# Patient Record
Sex: Female | Born: 1945 | Race: White | Hispanic: No | Marital: Married | Smoking: Former smoker
Health system: Southern US, Community
[De-identification: ages and names within clinical notes are randomized; demographics above are authoritative.]

## PROBLEM LIST (undated history)

## (undated) DIAGNOSIS — F419 Anxiety disorder, unspecified: Secondary | ICD-10-CM

## (undated) DIAGNOSIS — I219 Acute myocardial infarction, unspecified: Secondary | ICD-10-CM

## (undated) DIAGNOSIS — J189 Pneumonia, unspecified organism: Secondary | ICD-10-CM

## (undated) DIAGNOSIS — G4733 Obstructive sleep apnea (adult) (pediatric): Secondary | ICD-10-CM

## (undated) DIAGNOSIS — I10 Essential (primary) hypertension: Secondary | ICD-10-CM

## (undated) DIAGNOSIS — Z972 Presence of dental prosthetic device (complete) (partial): Secondary | ICD-10-CM

## (undated) DIAGNOSIS — J449 Chronic obstructive pulmonary disease, unspecified: Secondary | ICD-10-CM

## (undated) DIAGNOSIS — M069 Rheumatoid arthritis, unspecified: Secondary | ICD-10-CM

## (undated) DIAGNOSIS — M797 Fibromyalgia: Secondary | ICD-10-CM

## (undated) DIAGNOSIS — M25551 Pain in right hip: Secondary | ICD-10-CM

## (undated) DIAGNOSIS — S2239XA Fracture of one rib, unspecified side, initial encounter for closed fracture: Secondary | ICD-10-CM

## (undated) DIAGNOSIS — K219 Gastro-esophageal reflux disease without esophagitis: Secondary | ICD-10-CM

## (undated) DIAGNOSIS — K529 Noninfective gastroenteritis and colitis, unspecified: Secondary | ICD-10-CM

## (undated) DIAGNOSIS — J45909 Unspecified asthma, uncomplicated: Secondary | ICD-10-CM

## (undated) DIAGNOSIS — E0590 Thyrotoxicosis, unspecified without thyrotoxic crisis or storm: Secondary | ICD-10-CM

## (undated) DIAGNOSIS — M1990 Unspecified osteoarthritis, unspecified site: Secondary | ICD-10-CM

## (undated) DIAGNOSIS — M79643 Pain in unspecified hand: Secondary | ICD-10-CM

## (undated) HISTORY — DX: Fibromyalgia: M79.7

## (undated) HISTORY — DX: Unspecified osteoarthritis, unspecified site: M19.90

## (undated) HISTORY — DX: Noninfective gastroenteritis and colitis, unspecified: K52.9

## (undated) HISTORY — PX: BREAST BIOPSY: SHX20

## (undated) HISTORY — DX: Fracture of one rib, unspecified side, initial encounter for closed fracture: S22.39XA

## (undated) HISTORY — DX: Unspecified asthma, uncomplicated: J45.909

## (undated) HISTORY — DX: Thyrotoxicosis, unspecified without thyrotoxic crisis or storm: E05.90

## (undated) HISTORY — PX: VAGINAL HYSTERECTOMY: SUR661

## (undated) HISTORY — DX: Chronic obstructive pulmonary disease, unspecified: J44.9

## (undated) HISTORY — PX: NASAL SINUS SURGERY: SHX719

## (undated) HISTORY — PX: CHOLECYSTECTOMY: SHX55

## (undated) HISTORY — DX: Obstructive sleep apnea (adult) (pediatric): G47.33

## (undated) HISTORY — DX: Anxiety disorder, unspecified: F41.9

## (undated) HISTORY — DX: Gastro-esophageal reflux disease without esophagitis: K21.9

## (undated) HISTORY — DX: Essential (primary) hypertension: I10

## (undated) HISTORY — DX: Pneumonia, unspecified organism: J18.9

---
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
## 1998-06-30 ENCOUNTER — Encounter: Payer: Self-pay | Admitting: Emergency Medicine

## 1998-06-30 ENCOUNTER — Emergency Department (HOSPITAL_COMMUNITY): Admission: EM | Admit: 1998-06-30 | Discharge: 1998-06-30 | Payer: Self-pay | Admitting: Emergency Medicine

## 1999-03-17 ENCOUNTER — Ambulatory Visit (HOSPITAL_COMMUNITY): Admission: RE | Admit: 1999-03-17 | Discharge: 1999-03-17 | Payer: Self-pay | Admitting: Gastroenterology

## 1999-03-25 ENCOUNTER — Ambulatory Visit (HOSPITAL_COMMUNITY): Admission: RE | Admit: 1999-03-25 | Discharge: 1999-03-25 | Payer: Self-pay | Admitting: Gastroenterology

## 1999-03-25 ENCOUNTER — Encounter: Payer: Self-pay | Admitting: Gastroenterology

## 1999-04-09 ENCOUNTER — Encounter: Admission: RE | Admit: 1999-04-09 | Discharge: 1999-04-09 | Payer: Self-pay | Admitting: General Surgery

## 1999-04-09 ENCOUNTER — Encounter: Payer: Self-pay | Admitting: General Surgery

## 1999-04-22 ENCOUNTER — Ambulatory Visit (HOSPITAL_COMMUNITY): Admission: RE | Admit: 1999-04-22 | Discharge: 1999-04-23 | Payer: Self-pay | Admitting: General Surgery

## 1999-04-22 ENCOUNTER — Encounter (INDEPENDENT_AMBULATORY_CARE_PROVIDER_SITE_OTHER): Payer: Self-pay

## 1999-04-22 ENCOUNTER — Encounter: Payer: Self-pay | Admitting: General Surgery

## 1999-07-28 ENCOUNTER — Encounter: Payer: Self-pay | Admitting: Emergency Medicine

## 1999-07-28 ENCOUNTER — Emergency Department (HOSPITAL_COMMUNITY): Admission: EM | Admit: 1999-07-28 | Discharge: 1999-07-28 | Payer: Self-pay | Admitting: Emergency Medicine

## 1999-07-29 ENCOUNTER — Ambulatory Visit (HOSPITAL_COMMUNITY): Admission: RE | Admit: 1999-07-29 | Discharge: 1999-07-29 | Payer: Self-pay | Admitting: Gastroenterology

## 1999-07-29 ENCOUNTER — Encounter (INDEPENDENT_AMBULATORY_CARE_PROVIDER_SITE_OTHER): Payer: Self-pay | Admitting: *Deleted

## 2000-01-12 ENCOUNTER — Encounter: Payer: Self-pay | Admitting: Family Medicine

## 2000-01-12 ENCOUNTER — Ambulatory Visit (HOSPITAL_COMMUNITY): Admission: RE | Admit: 2000-01-12 | Discharge: 2000-01-12 | Payer: Self-pay | Admitting: Family Medicine

## 2000-08-17 ENCOUNTER — Encounter: Payer: Self-pay | Admitting: Family Medicine

## 2000-08-17 ENCOUNTER — Ambulatory Visit (HOSPITAL_COMMUNITY): Admission: RE | Admit: 2000-08-17 | Discharge: 2000-08-17 | Payer: Self-pay | Admitting: Family Medicine

## 2001-03-01 ENCOUNTER — Ambulatory Visit (HOSPITAL_COMMUNITY): Admission: RE | Admit: 2001-03-01 | Discharge: 2001-03-01 | Payer: Self-pay | Admitting: Family Medicine

## 2001-03-01 ENCOUNTER — Encounter: Payer: Self-pay | Admitting: Family Medicine

## 2001-08-01 ENCOUNTER — Ambulatory Visit: Admission: RE | Admit: 2001-08-01 | Discharge: 2001-08-01 | Payer: Self-pay | Admitting: Family Medicine

## 2001-09-20 ENCOUNTER — Ambulatory Visit (HOSPITAL_COMMUNITY): Admission: RE | Admit: 2001-09-20 | Discharge: 2001-09-20 | Payer: Self-pay | Admitting: Internal Medicine

## 2002-07-31 ENCOUNTER — Encounter: Admission: RE | Admit: 2002-07-31 | Discharge: 2002-07-31 | Payer: Self-pay | Admitting: Endocrinology

## 2002-07-31 ENCOUNTER — Encounter: Payer: Self-pay | Admitting: Endocrinology

## 2002-08-30 ENCOUNTER — Ambulatory Visit (HOSPITAL_COMMUNITY): Admission: RE | Admit: 2002-08-30 | Discharge: 2002-08-30 | Payer: Self-pay | Admitting: *Deleted

## 2002-09-04 ENCOUNTER — Encounter: Payer: Self-pay | Admitting: Family Medicine

## 2002-09-04 ENCOUNTER — Ambulatory Visit: Admission: RE | Admit: 2002-09-04 | Discharge: 2002-09-04 | Payer: Self-pay | Admitting: Family Medicine

## 2004-02-05 ENCOUNTER — Ambulatory Visit (HOSPITAL_COMMUNITY): Admission: RE | Admit: 2004-02-05 | Discharge: 2004-02-05 | Payer: Self-pay | Admitting: Family Medicine

## 2005-03-30 ENCOUNTER — Ambulatory Visit: Payer: Self-pay | Admitting: Internal Medicine

## 2005-04-15 ENCOUNTER — Ambulatory Visit: Admission: RE | Admit: 2005-04-15 | Discharge: 2005-04-15 | Payer: Self-pay | Admitting: Family Medicine

## 2005-05-18 ENCOUNTER — Ambulatory Visit: Payer: Self-pay | Admitting: Internal Medicine

## 2006-04-26 ENCOUNTER — Encounter: Admission: RE | Admit: 2006-04-26 | Discharge: 2006-04-26 | Payer: Self-pay | Admitting: Endocrinology

## 2006-06-22 ENCOUNTER — Ambulatory Visit (HOSPITAL_COMMUNITY): Admission: RE | Admit: 2006-06-22 | Discharge: 2006-06-22 | Payer: Self-pay | Admitting: *Deleted

## 2008-04-09 ENCOUNTER — Other Ambulatory Visit: Admission: RE | Admit: 2008-04-09 | Discharge: 2008-04-09 | Payer: Self-pay | Admitting: Family Medicine

## 2008-08-12 ENCOUNTER — Emergency Department (HOSPITAL_COMMUNITY): Admission: EM | Admit: 2008-08-12 | Discharge: 2008-08-12 | Payer: Self-pay | Admitting: Emergency Medicine

## 2008-08-22 ENCOUNTER — Encounter: Admission: RE | Admit: 2008-08-22 | Discharge: 2008-08-22 | Payer: Self-pay | Admitting: Family Medicine

## 2008-08-31 ENCOUNTER — Encounter: Admission: RE | Admit: 2008-08-31 | Discharge: 2008-08-31 | Payer: Self-pay | Admitting: Neurosurgery

## 2009-10-14 ENCOUNTER — Ambulatory Visit: Payer: Self-pay | Admitting: Cardiology

## 2009-10-16 ENCOUNTER — Telehealth (INDEPENDENT_AMBULATORY_CARE_PROVIDER_SITE_OTHER): Payer: Self-pay | Admitting: *Deleted

## 2009-10-17 ENCOUNTER — Encounter: Payer: Self-pay | Admitting: Cardiology

## 2009-10-17 ENCOUNTER — Encounter (HOSPITAL_COMMUNITY): Admission: RE | Admit: 2009-10-17 | Discharge: 2009-11-20 | Payer: Self-pay | Admitting: Cardiology

## 2009-10-17 ENCOUNTER — Ambulatory Visit: Payer: Self-pay

## 2009-10-17 ENCOUNTER — Ambulatory Visit: Payer: Self-pay | Admitting: Cardiology

## 2010-03-27 NOTE — Progress Notes (Signed)
Summary: Nuc Pre-Procedure  Phone Note Outgoing Call Call back at [facility] Phone [phone]   Call placed by: Antionette Char RN,  October 16, 2009 2:29 PM Call placed to: Patient Reason for Call: Confirm/change Appt Summary of Call: Reviewed information on My[employer] Information Sheet (see scanned document for further details).  Spoke with patient.     Nuclear Med Background Indications for Stress Test: Evaluation for Ischemia   History: Asthma, COPD, Heart Catheterization, Myocardial Perfusion Study  History Comments: MPS Nml  Symptoms: Chest Pain  Symptoms Comments: CP with Radiation into R Jaw and Back   Nuclear Pre-Procedure Cardiac Risk Factors: Family History - CAD, History of Smoking, Hypertension

## 2010-03-27 NOTE — Assessment & Plan Note (Signed)
Summary: Cardiology Nuclear Testing  Nuclear Med Background Indications for Stress Test: Evaluation for Ischemia   History: Asthma, COPD, Heart Catheterization, Myocardial Perfusion Study  History Comments: yrs ago MPS and Cath:normal  Symptoms: Chest Pressure, Chest Pressure with Exertion, Diaphoresis, DOE, Fatigue, Rapid HR  Symptoms Comments: CP>jaw/back   Nuclear Pre-Procedure Cardiac Risk Factors: Family History - CAD, History of Smoking, Hypertension Caffeine/Decaff Intake: None NPO After: 7:30 AM Lungs: Clear.  O2 Sat 96%. IV 0.9% NS with Angio Cath: 24g     IV Site: Rt Hand IV Started by: Bonnita Levan, RN Chest Size (in) 42     Cup Size B     Height (in): 68 Weight (lb): 197 BMI: 30.06  Nuclear Med Study 1 or 2 day study:  1 day     Stress Test Type:  Treadmill/Lexiscan Reading MD:  Willa Rough, MD     Referring MD:  Peter Swaziland, MD Resting Radionuclide:  Technetium 48m Tetrofosmin     Resting Radionuclide Dose:  11 mCi  Stress Radionuclide:  Technetium 33m Tetrofosmin     Stress Radionuclide Dose:  33 mCi   Stress Protocol Exercise Time (min):  2:00 min     Max HR:  117 bpm     Predicted Max HR:  156 bpm       Percent Max HR:  75 % Lexiscan: 0.4 mg   Stress Test Technologist:  Rea [employer], CMA-N     Nuclear Technologist:  Doyne Keel, CNMT  Rest Procedure  Myocardial perfusion imaging was performed at rest 45 minutes following the intravenous administration of Technetium 75m Tetrofosmin.  Stress Procedure  The patient received IV Lexiscan 0.4 mg over 15-seconds with concurrent low level exercise and then technetium 69m Tetrofosmin was injected at 30-seconds while the patient continued walking one more minute.  There were no significant changes with infusion.  She did c/o chest pressure.  Quantitative spect images were obtained after a 45 minute delay.  QPS Raw Data Images:  Patient motion noted; appropriate software correction applied. Stress Images:  Normal  homogeneous uptake in all areas of the myocardium. Rest Images:  Normal homogeneous uptake in all areas of the myocardium. Subtraction (SDS):  No evidence of ischemia. Transient Ischemic Dilatation:  1.14  (Normal <1.22)  Lung/Heart Ratio:  0.41  (Normal <0.45)  Quantitative Gated Spect Images QGS EDV:  69 ml QGS ESV:  24 ml QGS EF:  65 % QGS cine images:  Normal  Findings Normal nuclear study      Overall Impression  Exercise Capacity: Lexiscan with low level exercise BP Response: Normal blood pressure response. Clinical Symptoms: chest pressure ECG Impression: No significant ST segment change suggestive of ischemia. Overall Impression: Normal stress nuclear study.

## 2010-06-02 LAB — URINALYSIS, ROUTINE W REFLEX MICROSCOPIC
Bilirubin Urine: NEGATIVE
Glucose, UA: NEGATIVE mg/dL
Ketones, ur: NEGATIVE mg/dL
Nitrite: POSITIVE — AB
Protein, ur: 100 mg/dL — AB
Specific Gravity, Urine: 1.014 (ref 1.005–1.030)
Urobilinogen, UA: 0.2 mg/dL (ref 0.0–1.0)
pH: 5.5 (ref 5.0–8.0)

## 2010-06-02 LAB — URINE MICROSCOPIC-ADD ON

## 2010-07-11 NOTE — Op Note (Signed)
   NAME:  Kelsey Diaz, Kelsey Diaz                        ACCOUNT NO.:  [id number]   MEDICAL RECORD NO.:  [id number]                   PATIENT TYPE:  AMB   LOCATION:  ENDO                                 FACILITY:  [facility]   PHYSICIAN:  Georgiana Spinner, M.D.                 DATE OF BIRTH:  1945/12/08   DATE OF PROCEDURE:  DATE OF DISCHARGE:                                 OPERATIVE REPORT   PROCEDURE:  Upper endoscopy with Savary dilation.   INDICATIONS FOR PROCEDURE:  Dysphagia.   ANESTHESIA:  Demerol 80, Versed 8 mg.   DESCRIPTION OF PROCEDURE:  With the patient mildly sedated in the left  lateral decubitus position, the Olympus videoscopic endoscope was inserted  in the mouth and passed under direct vision through the esophagus which  appeared normal until we reached the distal esophagus and there was not a  stricture seen. We entered into the stomach. The fundus, body, antrum,  duodenal bulb and second portion of the duodenum were visualized. From this  point, the endoscope was slowly withdrawn taking circumferential views of  the duodenal mucosa until the endoscope was then pulled back in the stomach,  placed in retroflexion to view the stomach from below and a hiatal hernia  was seen and photographed. The endoscope was then straightened and withdrawn  taking circumferential views of the remaining gastric and esophageal mucosa.  It was then passed distally. Once again a guidewire was passed, the  endoscope was withdrawn and over the guidewire 16, 17 and 18 Savary dilators  were passed rather easily with no ____________, there was some blood but not  with the last or the first. The endoscope was reinserted. A small amount of  blood seen in the distal esophagus, otherwise, an unremarkable followup. The  endoscope was withdrawn.  The patient's vital signs and pulse oximeter  remained stable. The patient tolerated the procedure well without apparent  complications.   FINDINGS:  Successful  dilation.   PLAN:  Await clinical response. The patient will call me for results and  followup with me as an outpatient.                                               Georgiana Spinner, M.D.    GMO/MEDQ  D:  08/30/2002  T:  08/30/2002  Job:  [id number]

## 2010-07-11 NOTE — Op Note (Signed)
NAME:  Kelsey Diaz, Kelsey Diaz              ACCOUNT NO.:  [id number]   MEDICAL RECORD NO.:  [id number]          PATIENT TYPE:  AMB   LOCATION:  ENDO                         FACILITY:  [facility]   PHYSICIAN:  Georgiana Spinner, M.D.    DATE OF BIRTH:  1945-04-06   DATE OF PROCEDURE:  DATE OF DISCHARGE:                               OPERATIVE REPORT   PROCEDURE:  Colonoscopy.   INDICATIONS:  Colon cancer screening, blood in stool, abdominal pain.   ANESTHESIA:  Fentanyl 100 mcg, Versed 10 mg.   PROCEDURE:  With the patient mildly sedated in the left lateral  decubitus position, the Pentax videoscopic colonoscope was inserted in  the rectum and passed under direct vision to the cecum, identified by  the ileocecal valve and base of cecum, both of which were photographed.  From this point colonoscope was slowly withdrawn taking circumferential  views of the colonic mucosa, stopping only in the rectum, which appeared  normal on direct and showed hemorrhoids on retroflexed view.  The  endoscope was straightened and withdrawn.  The patient's vital signs and  pulse oximetry remained stable.  The patient tolerated the procedure  well without apparent complications.   FINDINGS:  1. Diverticulosis of the sigmoid colon.  2. Internal hemorrhoids.  3. Otherwise an unremarkable colonoscopic examination to the cecum.   PLAN:  Repeat examination possibly in 5 years.           ______________________________  Georgiana Spinner, M.D.     GMO/MEDQ  D:  06/22/2006  T:  06/22/2006  Job:  [id number]

## 2010-07-11 NOTE — Procedures (Signed)
[place]. [facility]  Patient:    Kelsey Diaz                      MRN: [id number] Proc. Date: 03/17/99 Adm. Date:  [id number] Attending:  Louie Bun CC:         Dario Guardian, M.D.                           Procedure Report  PROCEDURE:  Esophagogastroduodenoscopy.  SURGEON:  John C. Madilyn Fireman, M.D.  INDICATIONS:  Epigastric burning with ulcer-like features, as well as, gastroesophageal reflux symptoms.  The latter have been responsive to protonics, but the former have not.  Procedure is to assess for refractory acid/peptic disease of the upper GI tract and to assess Helicobacter status.  PROCEDURE:  The patient was placed in the left lateral decubitus position and placed on the pulse monitor with continuous low-flow oxygen delivered by nasal cannula.  She was sedated with 30 mg IV Demerol and 5 mg IV Versed.  The Olympus video endoscope was advanced under direct vision into the oropharynx and esophagus. The esophagus was straight and of normal caliber at the squamocolumnar line at 38 cm.  There was a 1-1.5 mm sliding hiatal hernia and a thin, barely visible lower esophageal ring that was able to be seen only when the LES was fully relaxed. There was no visible esophagitis or other abnormality of the GE junction.  The stomach was entered and a small amount of liquid secretions were suctioned from the fundus.  Retroflexed view of the cardia was unremarkable.  The fundus, body, antrum and pylorus were all well inspected and appeared to be within normal limits. The pylorus was nondeformed and easily allowed passage of the endoscope tipping the  duodenum.  Both the bulb and second portion were well inspected and appeared to be within normal limits.  The endoscope was withdrawn back into the stomach and then CLO test obtained.  The scope is then withdrawn and the patient returned to the  recovery room in stable condition.  She tolerated  the procedure well and there ere no immediate complications.  IMPRESSION:  A 1.5 cm hiatal hernia with patent lower esophageal ring, otherwise normal endoscopy.  PLAN:  Await CLO test and treat for eradication of Helicobacter if positive. If negative, will pursue abdominal ultrasound. DD:  03/17/99 TD:  03/17/99 Job: [id number]

## 2010-07-11 NOTE — Procedures (Signed)
[place]. [facility]  Patient:    Kelsey Diaz, Kelsey Diaz                     MRN: [id number] Proc. Date: 07/29/99 Adm. Date:  [id number] Disc. Date: [id number] Attending:  Doug Sou CC:         Dario Guardian, M.D.                           Procedure Report  PROCEDURE PERFORMED:  Colonoscopy with biopsy.  ENDOSCOPIST:  Everardo All. Madilyn Fireman, M.D.  INDICATIONS FOR PROCEDURE:  Chronic diarrhea with negative work-up to-date and failure to respond to empiric trials.  Procedure is primarily to rule out an inflammatory condition of the colon or terminal ileum.  DESCRIPTION OF PROCEDURE:  The patient was placed in the left lateral decubitus position and placed on the pulse monitor and continuous low flow oxygen delivered by nasal cannula.  She was sedated with 100 mg IV Demerol and 8 mg IV Versed.  The Olympus video colonoscope was inserted into the rectum and advanced to the cecum, confirmed by transillumination of McBurneys point and visualization of the ileocecal valve and appendiceal orifice.  The prep was excellent.  The terminal ileum was intubated and explored for several centimeters and appeared to be within normal limits.  Biopsies were then taken of the cecum and ascending colon which appeared normal.  The transverse, descending, sigmoid and rectum all likewise appeared normal with no masses, polyps, diverticula or any visible mucosal abnormalities.  Biopsies were taken of the rectum and sigmoid and sent in a separate specimen container to rule out collaginous colitis.  Small internal hemorrhoids were seen on withdrawal. The scope was then withdrawn and the patient returned to the recovery room in stable condition.  The patient tolerated the procedure well.  There were no immediate complications.  IMPRESSION:  Internal hemorrhoids, otherwise normal colonoscopy including terminal ileum.  PLAN:  Await biopsy results and pursue further work-up of diarrhea  as indicated. DD:  07/29/99 TD:  08/01/99 Job: [id number]

## 2011-02-24 HISTORY — PX: KNEE SURGERY: SHX244

## 2011-03-02 ENCOUNTER — Other Ambulatory Visit: Payer: Self-pay | Admitting: Family Medicine

## 2011-03-02 DIAGNOSIS — R51 Headache: Secondary | ICD-10-CM

## 2011-03-04 ENCOUNTER — Ambulatory Visit
Admission: RE | Admit: 2011-03-04 | Discharge: 2011-03-04 | Disposition: A | Discharge status: Home / Self Care | Payer: Medicare Other | Source: Ambulatory Visit | Attending: Family Medicine | Admitting: Family Medicine

## 2011-03-04 ENCOUNTER — Ambulatory Visit
Admission: RE | Admit: 2011-03-04 | Discharge: 2011-03-04 | Disposition: A | Discharge status: Home / Self Care | Payer: Self-pay | Source: Ambulatory Visit | Attending: Family Medicine | Admitting: Family Medicine

## 2011-03-04 DIAGNOSIS — R51 Headache: Secondary | ICD-10-CM

## 2011-03-06 ENCOUNTER — Other Ambulatory Visit: Payer: Self-pay | Admitting: Obstetrics and Gynecology

## 2011-09-07 ENCOUNTER — Ambulatory Visit (INDEPENDENT_AMBULATORY_CARE_PROVIDER_SITE_OTHER)
Admission: RE | Admit: 2011-09-07 | Discharge: 2011-09-07 | Disposition: A | Discharge status: Home / Self Care | Payer: Medicare Other | Source: Ambulatory Visit | Attending: Pulmonary Disease | Admitting: Pulmonary Disease

## 2011-09-07 ENCOUNTER — Encounter: Payer: Self-pay | Admitting: *Deleted

## 2011-09-07 ENCOUNTER — Ambulatory Visit (INDEPENDENT_AMBULATORY_CARE_PROVIDER_SITE_OTHER): Payer: Medicare Other | Admitting: Pulmonary Disease

## 2011-09-07 VITALS — BP 166/100 | HR 113 | Temp 99.2°F | Ht 67.5 in | Wt 207.8 lb

## 2011-09-07 DIAGNOSIS — J4489 Other specified chronic obstructive pulmonary disease: Secondary | ICD-10-CM

## 2011-09-07 DIAGNOSIS — J449 Chronic obstructive pulmonary disease, unspecified: Secondary | ICD-10-CM

## 2011-09-07 MED ORDER — AEROCHAMBER MV MISC: Status: AC

## 2011-09-07 MED ORDER — MOMETASONE FURO-FORMOTEROL FUM 100-5 MCG/ACT IN AERO: 2.0000 | INHALATION_SPRAY | Freq: Two times a day (BID) | RESPIRATORY_TRACT | Status: DC

## 2011-09-07 NOTE — Progress Notes (Signed)
Chief Complaint  Patient presents with  . Advice Only    Ref by Dr. C.White- asthma and COPD-pt reports having SOB, wheezing and chest tightness for about 6months, heat and movement worsen sypmtoms and  resting relieves syptoms, reports dry nonprod cough    History of Present Illness: Kelsey Diaz is a 66 y.o. female former smoker for evaluation of COPD.  She has noticed trouble with her breathing for years.  This has gotten progressively worse after she had a water leak in her home.  She thinks she may have mold in her house now.  She has been on prednisone 4 times in the past one year due to her breathing problems.  She is currently on a prednisone taper.  She also has allergy to lilies.  Unfortunately, she received this type of flower for Mother's day, and she has noticed her breathing getting worse since then.  She is not aware of any other allergen, and has never been tested for allergies before.  She was on advair before.  This helped open her airways, but then also triggered increased cough.  She has noticed more cough and respiratory distress around strong smells.  Her cough is usually dry.  She was having episodes of wheezing prior to starting prednisone again.  Her breathing and cough are better with prednisone therapy.  She has tried pulmicort, but did not tolerate this due to smell of medicine.  She was tried on spiriva, but this made her mouth dry.  She does get frequent episodes of sinus congestion and post-nasal drip.  She uses flonase and claritin, but only as needed.   She had sinus surgery years ago, and this did help her sinus congestion some.  She does have a hiatal hernia, but denies symptoms of reflux.  She quit smoking in 2001.  She is from [place], and denies any recent travel.  She denies any animal exposures.  There is no history of occupational exposures.  She was on lisinopril, but this was stopped August 12, 2011 by her PCP due to concern she could have ACE  inhibitor cough.   Past Medical History  Diagnosis Date  . Chest pain   . Fibromyalgia   . Hypertension   . COPD (chronic obstructive pulmonary disease)   . Osteoarthritis   . GERD (gastroesophageal reflux disease)     With esophageal strictures  . Colitis   . Hyperthyroidism   . Anxiety   . Allergic rhinitis   . Asthma     Past Surgical History  Procedure Date  . Cholecystectomy     2005  . Breast biopsy     x2  . Vaginal hysterectomy   . Nasal sinus surgery     Current Outpatient Prescriptions on File Prior to Visit  Medication Sig Dispense Refill  . albuterol (PROVENTIL) (2.5 MG/3ML) 0.083% nebulizer solution Take 2.5 mg by nebulization every 6 (six) hours as needed.      . Calcium Carbonate-Vitamin D (CALCIUM + D) 600-200 MG-UNIT TABS Take by mouth daily.      . DULoxetine (CYMBALTA) 60 MG capsule Take 60 mg by mouth daily.      . fluticasone (FLONASE) 50 MCG/ACT nasal spray Place 2 sprays into the nose as needed.      . loratadine (CLARITIN) 10 MG tablet Take 10 mg by mouth as needed.      Marland Kitchen losartan (COZAAR) 25 MG tablet Take 25 mg by mouth daily.      Marland Kitchen  meloxicam (MOBIC) 15 MG tablet Take 15 mg by mouth daily.      . Multiple Vitamin (MULTI-VITAMIN PO) Take by mouth daily.      Marland Kitchen omeprazole (PRILOSEC) 20 MG capsule Take 20 mg by mouth 2 (two) times daily.      . traMADol (ULTRAM) 50 MG tablet Take 50 mg by mouth every 6 (six) hours as needed.      Marland Kitchen GLUCOSAMINE PO Take by mouth daily.      . mometasone-formoterol (DULERA) 100-5 MCG/ACT AERO Inhale 2 puffs into the lungs 2 (two) times daily.  1 Inhaler  5  . Omega-3 Fatty Acids (FISH OIL) 1000 MG CAPS Take 1,000 mg by mouth daily.      . sertraline (ZOLOFT) 100 MG tablet Take 100 mg by mouth 2 (two) times daily.        Allergies  Allergen Reactions  . Advair Diskus (Fluticasone-Salmeterol)     Sensitivity to smells  . Ambien (Zolpidem Tartrate)     irritable  . Amlodipine Besylate     Feels bad  . Bextra  (Valdecoxib)     Stomach issues  . Captopril     Ache  . Lisinopril     cough  . Morphine   . Oxycodone Hcl   . Pulmicort (Budesonide)     Worse breathing, sensitivity to smells  . Savella (Milnacipran Hcl)     Mental fog  . Spiriva (Tiotropium Bromide Monohydrate)     Dry mouth    Family History  Problem Relation Age of Onset  . Hypertension Mother   . Diabetes Mother   . Heart attack Father   . Cancer Father     kidney  . Heart attack Brother   . Emphysema Father   . Allergies Mother   . Heart disease Father     History  Substance Use Topics  . Smoking status: Former Smoker -- 2.0 packs/day for 40 years    Types: Cigarettes    Quit date: 02/24/1999  . Smokeless tobacco: Never Used  . Alcohol Use: Yes     occassioal    Review of Systems  Constitutional: Positive for diaphoresis and fatigue. Negative for fever, chills, activity change, appetite change and unexpected weight change.  HENT: Positive for rhinorrhea and postnasal drip. Negative for nosebleeds, congestion, sore throat, sneezing, mouth sores, trouble swallowing, dental problem, voice change, sinus pressure, tinnitus and ear discharge.   Eyes: Negative for photophobia, discharge, itching and visual disturbance.  Respiratory: Positive for cough, choking, chest tightness, shortness of breath and wheezing.   Cardiovascular: Positive for palpitations. Negative for chest pain and leg swelling.  Gastrointestinal: Positive for nausea, diarrhea and constipation. Negative for vomiting, abdominal pain, blood in stool and abdominal distention.  Genitourinary: Negative for dysuria, urgency, hematuria and difficulty urinating.  Musculoskeletal: Negative for myalgias, back pain, joint swelling, arthralgias and gait problem.  Skin: Positive for rash.  Neurological: Positive for dizziness. Negative for tremors, seizures, syncope, speech difficulty, weakness, light-headedness, numbness and headaches.  Hematological: Negative  for adenopathy. Bruises/bleeds easily.  Psychiatric/Behavioral: Positive for confusion, disturbed wake/sleep cycle and agitation. The patient is nervous/anxious.    Physical Exam: Filed Vitals:   09/07/11 1044  BP: 166/100  Pulse: 113  Temp: 99.2 F (37.3 C)  TempSrc: Oral  Height: 5' 7.5" (1.715 m)  Weight: 207 lb 12.8 oz (94.257 kg)  SpO2: 92%  ,  Current Encounter SPO2  09/07/11 1044 92%   Wt Readings from Last 3 Encounters:  09/07/11 207 lb 12.8 oz (94.257 kg)  10/17/09 197 lb (89.359 kg)   Body mass index is 32.07 kg/(m^2).  General - No distress ENT - Ears normal. Throat and pharynx normal. Neck supple. No adenopathy or masses in the neck or supraclavicular regions. Nasal septum midline, no deformities, nares patent, normal mucosa without swelling, no polyps, no bleeding. Paranasal sinuses are normal. No tenderness to the maxillary, frontal, ethmoids or mastoids. Cardiac - S1 and S2 normal, no murmurs, clicks, gallops or rubs. Regular rate and rhythm.  No edema or JVD. Chest - Chest is clear, no wheezing or rales. Normal symmetric air entry throughout both lung fields. No chest wall deformities or tenderness. Back - Cervical, thoracic and lumbar spine exam is normal without tenderness, masses or kyphoscoliosis. Full range of motion without pain is noted. Abd - soft without tenderness, guarding, mass, rebound or organomegaly. Bowel sounds are normal. No CVA tenderness noted. Ext - good pulses, motor strength and sensation. Neuro - Cranial nerves are normal. PERLA. EOM's intact. Skin - no discernible active dermatitis, erythema, urticaria or inflammatory process. Psych - normal mood, and behavior.    Assessment/Plan:  Coralyn Helling, MD [place] [facility] Pager:  [phone] 09/08/2011, 8:24 AM  Patient Instructions  Chest xray today Will schedule breathing test (PFT) Nasal irrigation (saline sinus spray) daily until next visit Flonase two sprays  each nostril daily until next visit Dulera two puffs twice per day, and rinse mouth after each use>>use with spacer device Proair two puffs as needed Follow up in 3 to 4 weeks

## 2011-09-07 NOTE — Assessment & Plan Note (Addendum)
She has prior history of smoking.  She reports history of asthma.  She has sinus congestion, and post-nasal drip with sensitivity to smells.    I have advised her to finish her current course of prednisone.  I do not think she needs antibiotics at present.  To further assess with arrange for chest xray and pulmonary function testing.  To further treat her sinuses I have advised that she use nasal irrigation, and flonase on a daily basis until her next visit.  She can continue claritin as needed.  I explained how sinus disease and post-nasal drip can contribute to her lower respiratory symptoms.  If her symptoms do not improve, then she may need further allergy assessment.  She has difficulty tolerating inhaler therapy.  Will try her on dulera with a spacer device.  She can continue albuterol as needed.  If is also possible her cough could be related to prior ACE inhibitor use.  While she has not noticed initial improvement after stopping lisinopril, it can sometimes take several weeks before noticing improvement.

## 2011-09-07 NOTE — Progress Notes (Deleted)
  Subjective:    Patient ID: Kelsey Diaz, female    DOB: 01/17/1946, 66 y.o.   MRN: [id number]  HPI    Review of Systems  Constitutional: Positive for diaphoresis and fatigue. Negative for fever, chills, activity change, appetite change and unexpected weight change.  HENT: Positive for rhinorrhea and postnasal drip. Negative for nosebleeds, congestion, sore throat, sneezing, mouth sores, trouble swallowing, dental problem, voice change, sinus pressure, tinnitus and ear discharge.   Eyes: Negative for photophobia, discharge, itching and visual disturbance.  Respiratory: Positive for cough, choking, chest tightness, shortness of breath and wheezing.   Cardiovascular: Positive for palpitations. Negative for chest pain and leg swelling.  Gastrointestinal: Positive for nausea, diarrhea and constipation. Negative for vomiting, abdominal pain, blood in stool and abdominal distention.  Genitourinary: Negative for dysuria, urgency, hematuria and difficulty urinating.  Musculoskeletal: Negative for myalgias, back pain, joint swelling, arthralgias and gait problem.  Skin: Positive for rash.  Neurological: Positive for dizziness. Negative for tremors, seizures, syncope, speech difficulty, weakness, light-headedness, numbness and headaches.  Hematological: Negative for adenopathy. Bruises/bleeds easily.  Psychiatric/Behavioral: Positive for confusion, disturbed wake/sleep cycle and agitation. The patient is nervous/anxious.        Objective:   Physical Exam        Assessment & Plan:

## 2011-09-07 NOTE — Patient Instructions (Signed)
Chest xray today Will schedule breathing test (PFT) Nasal irrigation (saline sinus spray) daily until next visit Flonase two sprays each nostril daily until next visit Dulera two puffs twice per day, and rinse mouth after each use>>use with spacer device Proair two puffs as needed Follow up in 3 to 4 weeks

## 2011-09-08 ENCOUNTER — Encounter: Payer: Self-pay | Admitting: Pulmonary Disease

## 2011-09-08 ENCOUNTER — Telehealth: Payer: Self-pay | Admitting: Pulmonary Disease

## 2011-09-08 NOTE — Telephone Encounter (Signed)
Dg Chest 2 View  09/07/2011  *RADIOLOGY REPORT*  Clinical Data: Cough, shortness of breath.  CHEST - 2 VIEW  Comparison: 02/05/2004  Findings: There is hyperinflation of the lungs compatible with COPD.  Heart and mediastinal contours are within normal limits.  No focal opacities or effusions.  No acute bony abnormality.  IMPRESSION: COPD.  No active disease.  Original Report Authenticated By: Cyndie Chime, M.D.    Will have my nurse inform patient that CXR shows expected changes from COPD.  No other findings.  No change to treatment plan.

## 2011-09-08 NOTE — Telephone Encounter (Signed)
I spoke with patient about results and she verbalized understanding and had no questions 

## 2011-09-09 ENCOUNTER — Institutional Professional Consult (permissible substitution): Payer: Medicare Other | Admitting: Internal Medicine

## 2011-09-25 ENCOUNTER — Telehealth: Payer: Self-pay | Admitting: Pulmonary Disease

## 2011-09-25 NOTE — Telephone Encounter (Signed)
Spoke with Marchelle Folks and notified her of pt's dx and ins id number for the aero chamber that they provided for pt. Nothing further needed.

## 2011-10-09 ENCOUNTER — Ambulatory Visit (INDEPENDENT_AMBULATORY_CARE_PROVIDER_SITE_OTHER): Payer: Medicare Other | Admitting: Pulmonary Disease

## 2011-10-09 ENCOUNTER — Encounter: Payer: Self-pay | Admitting: Pulmonary Disease

## 2011-10-09 VITALS — BP 156/90 | HR 86 | Temp 98.7°F | Ht 68.0 in | Wt 212.0 lb

## 2011-10-09 DIAGNOSIS — J449 Chronic obstructive pulmonary disease, unspecified: Secondary | ICD-10-CM

## 2011-10-09 LAB — PULMONARY FUNCTION TEST

## 2011-10-09 NOTE — Patient Instructions (Signed)
Follow up in 4 months 

## 2011-10-09 NOTE — Progress Notes (Signed)
PFT done today. 

## 2011-10-09 NOTE — Assessment & Plan Note (Signed)
She has improvement with inhaler therapy.  I have advised her to continue dulera and prn albuterol.  Discussed the option of pulmonary rehab.  She would like to continue with water aerobics for now.

## 2011-10-09 NOTE — Progress Notes (Signed)
Chief Complaint  Patient presents with  . Follow-up    w/ pft. breathing unchanged, sitll feels heavy in her chest, the congestion is btter, cough is better    History of Present Illness: Kelsey Diaz is a 66 y.o. female former smoker with COPD.  She feels dulera has helped.  She is not longer getting breathing troubles around strong smells.  She is not coughing as much, but still gets winded at times.  This is not as bad as before.  She still gets sinus congestion and sinus headaches.  She occasionally gets chest tightness.  She does water aerobics several times per week.  Past Medical History  Diagnosis Date  . Chest pain   . Fibromyalgia   . Hypertension   . COPD (chronic obstructive pulmonary disease)   . Osteoarthritis   . GERD (gastroesophageal reflux disease)     With esophageal strictures  . Colitis   . Hyperthyroidism   . Anxiety   . Allergic rhinitis   . Asthma     Past Surgical History  Procedure Date  . Cholecystectomy     2005  . Breast biopsy     x2  . Vaginal hysterectomy   . Nasal sinus surgery     Outpatient Encounter Prescriptions as of 10/09/2011  Medication Sig Dispense Refill  . albuterol (PROAIR HFA) 108 (90 BASE) MCG/ACT inhaler Inhale 2 puffs into the lungs every 6 (six) hours as needed.      Marland Kitchen albuterol (PROVENTIL) (2.5 MG/3ML) 0.083% nebulizer solution Take 2.5 mg by nebulization every 6 (six) hours as needed.      . Calcium Carbonate-Vitamin D (CALCIUM + D) 600-200 MG-UNIT TABS Take by mouth daily.      . DULoxetine (CYMBALTA) 60 MG capsule Take 60 mg by mouth daily.      . fluticasone (FLONASE) 50 MCG/ACT nasal spray Place 2 sprays into the nose daily.       Marland Kitchen GLUCOSAMINE PO Take by mouth daily.      Marland Kitchen HYDROcodone-homatropine (HYCODAN) 5-1.5 MG/5ML syrup Take by mouth every 6 (six) hours as needed.      . loratadine (CLARITIN) 10 MG tablet Take 10 mg by mouth as needed.      Marland Kitchen LORazepam (ATIVAN) 0.5 MG tablet As needed      . losartan  (COZAAR) 25 MG tablet Take 100 mg by mouth daily.       . meloxicam (MOBIC) 15 MG tablet Take 15 mg by mouth daily.      . methocarbamol (ROBAXIN) 750 MG tablet Take 750 mg by mouth at bedtime as needed.      . mometasone-formoterol (DULERA) 100-5 MCG/ACT AERO Inhale 2 puffs into the lungs 2 (two) times daily.  1 Inhaler  5  . Multiple Vitamin (MULTI-VITAMIN PO) Take by mouth daily.      . Omega-3 Fatty Acids (FISH OIL) 1000 MG CAPS Take 1,000 mg by mouth daily.      Marland Kitchen omeprazole (PRILOSEC) 20 MG capsule Take 20 mg by mouth 2 (two) times daily.      Marland Kitchen Spacer/Aero-Holding Chambers (AEROCHAMBER MV) inhaler Use as instructed  1 each  0  . traMADol (ULTRAM) 50 MG tablet Take 50 mg by mouth every 6 (six) hours as needed.      Marland Kitchen DISCONTD: predniSONE (DELTASONE) 10 MG tablet Take 10 mg by mouth daily. Use as directed, x12 days      . DISCONTD: sertraline (ZOLOFT) 100 MG tablet Take 100 mg by  mouth 2 (two) times daily.        Allergies  Allergen Reactions  . Advair Diskus (Fluticasone-Salmeterol)     Sensitivity to smells  . Ambien (Zolpidem Tartrate)     irritable  . Amlodipine Besylate     Feels bad  . Bextra (Valdecoxib)     Stomach issues  . Captopril     Ache  . Lisinopril     cough  . Morphine   . Oxycodone Hcl   . Pulmicort (Budesonide)     Worse breathing, sensitivity to smells  . Savella (Milnacipran Hcl)     Mental fog  . Spiriva (Tiotropium Bromide Monohydrate)     Dry mouth    Physical Exam:  Filed Vitals:   10/09/11 1606  BP: 156/90  Pulse: 86  Temp: 98.7 F (37.1 C)  TempSrc: Oral  Height: 5\' 8"  (1.727 m)  Weight: 212 lb (96.163 kg)  SpO2: 96%    Current Encounter SPO2  10/09/11 1606 96%  09/07/11 1044 92%     Body mass index is 32.23 kg/(m^2). Wt Readings from Last 2 Encounters:  10/09/11 212 lb (96.163 kg)  09/07/11 207 lb 12.8 oz (94.257 kg)    General - No distress ENT - no sinus tenderness, no oral exudate, no LAN Cardiac - s1s2 regular, no  murmur, pulses symmetric, no edema Chest - normal respiratory excursion, decreased breath sounds, no wheeze/rales/dullness Back - no focal tenderness Abd - soft, non-tender, + bowel sounds Ext - normal motor strength Neuro - Cranial nerves are normal. Skin - no rashes Psych - normal mood, and behavior.  PFT 10/09/11>>FEV1 2.20 (89%), FEV1% 63, TLC 5.22 (92%), DLCO 52%, no BD  Dg Chest 2 View  09/07/2011  *RADIOLOGY REPORT*  Clinical Data: Cough, shortness of breath.  CHEST - 2 VIEW  Comparison: 02/05/2004  Findings: There is hyperinflation of the lungs compatible with COPD.  Heart and mediastinal contours are within normal limits.  No focal opacities or effusions.  No acute bony abnormality.  IMPRESSION: COPD.  No active disease.  Original Report Authenticated By: Cyndie Chime, M.D.   Assessment/Plan:  Coralyn Helling, MD [facility]/Sleep Pager:  [phone] 10/09/2011, 4:12 PM

## 2012-02-09 ENCOUNTER — Ambulatory Visit (INDEPENDENT_AMBULATORY_CARE_PROVIDER_SITE_OTHER): Payer: Medicare Other | Admitting: Pulmonary Disease

## 2012-02-09 ENCOUNTER — Encounter: Payer: Self-pay | Admitting: Pulmonary Disease

## 2012-02-09 VITALS — BP 116/76 | HR 72 | Temp 99.7°F | Ht 67.5 in | Wt 215.2 lb

## 2012-02-09 DIAGNOSIS — R0989 Other specified symptoms and signs involving the circulatory and respiratory systems: Secondary | ICD-10-CM

## 2012-02-09 DIAGNOSIS — J4489 Other specified chronic obstructive pulmonary disease: Secondary | ICD-10-CM

## 2012-02-09 DIAGNOSIS — G4733 Obstructive sleep apnea (adult) (pediatric): Secondary | ICD-10-CM

## 2012-02-09 DIAGNOSIS — R0683 Snoring: Secondary | ICD-10-CM

## 2012-02-09 DIAGNOSIS — J449 Chronic obstructive pulmonary disease, unspecified: Secondary | ICD-10-CM

## 2012-02-09 DIAGNOSIS — R0609 Other forms of dyspnea: Secondary | ICD-10-CM

## 2012-02-09 HISTORY — DX: Obstructive sleep apnea (adult) (pediatric): G47.33

## 2012-02-09 MED ORDER — MONTELUKAST SODIUM 10 MG PO TABS: 10.0000 mg | ORAL_TABLET | Freq: Every day | ORAL | Status: DC

## 2012-02-09 NOTE — Assessment & Plan Note (Signed)
She continues to have frequent albuterol use.  Will add montelukast.  She is to continue dulera.

## 2012-02-09 NOTE — Assessment & Plan Note (Signed)
She reports snoring, sleep disruption, apnea, and daytime sleepiness.  She has history of hypertension and COPD.  I am concerned she could have sleep apnea.  I have explained how sleep apnea can affect the patient's health.  Driving precautions and importance of weight loss were discussed.  Treatment options for sleep apnea were reviewed.  To further assess will arrange for in lab sleep study.

## 2012-02-09 NOTE — Progress Notes (Signed)
Chief Complaint  Patient presents with  . Follow-up    breathing is better. some days still she can't breathe when the air feels "thick". slight dry cough, chest tx but no wheezing    History of Present Illness: Kelsey Diaz is a 66 y.o. female former smoker with COPD/asthma.  Her breathing is better with dulera.  She still needs to use albuterol about 5 times per week.  She notices more trouble with strong smells and muggy weather.  She feels chest tightness that gets better after using albuterol.  She has noticed more trouble feeling sleepy during the day.  She snores, and wakes up with a cough.     TESTS: PFT 10/09/11>>FEV1 2.20 (89%), FEV1% 63, TLC 5.22 (92%), DLCO 52%, no BD   Past Medical History  Diagnosis Date  . Chest pain   . Fibromyalgia   . Hypertension   . COPD (chronic obstructive pulmonary disease)   . Osteoarthritis   . GERD (gastroesophageal reflux disease)     With esophageal strictures  . Colitis   . Hyperthyroidism   . Anxiety   . Allergic rhinitis   . Asthma     Past Surgical History  Procedure Date  . Cholecystectomy     2005  . Breast biopsy     x2  . Vaginal hysterectomy   . Nasal sinus surgery     Outpatient Encounter Prescriptions as of 02/09/2012  Medication Sig Dispense Refill  . albuterol (PROAIR HFA) 108 (90 BASE) MCG/ACT inhaler Inhale 2 puffs into the lungs every 6 (six) hours as needed.      Marland Kitchen albuterol (PROVENTIL) (2.5 MG/3ML) 0.083% nebulizer solution Take 2.5 mg by nebulization every 6 (six) hours as needed.      . Calcium Carbonate-Vitamin D (CALCIUM + D) 600-200 MG-UNIT TABS Take by mouth daily.      . DULoxetine (CYMBALTA) 60 MG capsule Take 60 mg by mouth daily.      . fluticasone (FLONASE) 50 MCG/ACT nasal spray Place 2 sprays into the nose daily.       Marland Kitchen HYDROcodone-homatropine (HYCODAN) 5-1.5 MG/5ML syrup Take by mouth every 6 (six) hours as needed.      . loratadine (CLARITIN) 10 MG tablet Take 10 mg by mouth as  needed.      Marland Kitchen LORazepam (ATIVAN) 0.5 MG tablet Take 0.5 mg by mouth every 6 (six) hours as needed.      Marland Kitchen losartan (COZAAR) 100 MG tablet Take 100 mg by mouth daily.      . meloxicam (MOBIC) 15 MG tablet Take 15 mg by mouth daily.      . mometasone-formoterol (DULERA) 100-5 MCG/ACT AERO Inhale 2 puffs into the lungs 2 (two) times daily.  1 Inhaler  5  . Multiple Vitamin (MULTI-VITAMIN PO) Take by mouth daily.      Marland Kitchen omeprazole (PRILOSEC) 20 MG capsule Take 20 mg by mouth 2 (two) times daily.      Marland Kitchen Spacer/Aero-Holding Chambers (AEROCHAMBER MV) inhaler Use as instructed  1 each  0  . traMADol (ULTRAM) 50 MG tablet Take 50 mg by mouth every 6 (six) hours as needed.      . [DISCONTINUED] GLUCOSAMINE PO Take by mouth daily.      . [DISCONTINUED] LORazepam (ATIVAN) 0.5 MG tablet As needed      . [DISCONTINUED] losartan (COZAAR) 25 MG tablet Take 100 mg by mouth daily.       . [DISCONTINUED] methocarbamol (ROBAXIN) 750 MG tablet Take 750  mg by mouth at bedtime as needed.      . [DISCONTINUED] Omega-3 Fatty Acids (FISH OIL) 1000 MG CAPS Take 1,000 mg by mouth daily.        Allergies  Allergen Reactions  . Advair Diskus (Fluticasone-Salmeterol)     Sensitivity to smells  . Ambien (Zolpidem Tartrate)     irritable  . Amlodipine Besylate     Feels bad  . Bextra (Valdecoxib)     Stomach issues  . Captopril     Ache  . Lisinopril     cough  . Morphine   . Oxycodone Hcl   . Pulmicort (Budesonide)     Worse breathing, sensitivity to smells  . Savella (Milnacipran Hcl)     Mental fog  . Spiriva (Tiotropium Bromide Monohydrate)     Dry mouth    Physical Exam:  Filed Vitals:   02/09/12 1046 02/09/12 1047  BP:  116/76  Pulse:  72  Temp: 99.7 F (37.6 C)   TempSrc: Oral   Height: 5' 7.5" (1.715 m)   Weight: 215 lb 3.2 oz (97.614 kg)   SpO2:  96%     Current Encounter SPO2  02/09/12 1047 96%  10/09/11 1606 96%  09/07/11 1044 92%     Body mass index is 33.21  kg/(m^2).   Wt Readings from Last 2 Encounters:  02/09/12 215 lb 3.2 oz (97.614 kg)  10/09/11 212 lb (96.163 kg)     General - No distress ENT - No sinus tenderness, MP 3, no oral exudate, no LAN Cardiac - s1s2 regular, no murmur Chest - No wheeze/rales/dullness Back - No focal tenderness Abd - Soft, non-tender Ext - No edema Neuro - Normal strength Skin - No rashes Psych - normal mood, and behavior   Assessment/Plan:  Coralyn Helling, MD [facility]Critical [facility]/Sleep Pager:  [phone] 02/09/2012, 10:49 AM

## 2012-02-09 NOTE — Patient Instructions (Signed)
Montelukast 10 mg pill >> take one pill nightly before bedtime Will arrange for sleep study Follow up in February 2014

## 2012-03-07 ENCOUNTER — Ambulatory Visit (HOSPITAL_BASED_OUTPATIENT_CLINIC_OR_DEPARTMENT_OTHER): Payer: Medicare Other | Attending: Pulmonary Disease | Admitting: Radiology

## 2012-03-07 VITALS — Ht 68.0 in | Wt 215.0 lb

## 2012-03-07 DIAGNOSIS — G4733 Obstructive sleep apnea (adult) (pediatric): Secondary | ICD-10-CM | POA: Insufficient documentation

## 2012-03-07 DIAGNOSIS — J4489 Other specified chronic obstructive pulmonary disease: Secondary | ICD-10-CM | POA: Insufficient documentation

## 2012-03-07 DIAGNOSIS — R0683 Snoring: Secondary | ICD-10-CM

## 2012-03-07 DIAGNOSIS — Z79899 Other long term (current) drug therapy: Secondary | ICD-10-CM | POA: Insufficient documentation

## 2012-03-07 DIAGNOSIS — J449 Chronic obstructive pulmonary disease, unspecified: Secondary | ICD-10-CM | POA: Insufficient documentation

## 2012-03-07 DIAGNOSIS — I10 Essential (primary) hypertension: Secondary | ICD-10-CM | POA: Insufficient documentation

## 2012-03-17 DIAGNOSIS — G4733 Obstructive sleep apnea (adult) (pediatric): Secondary | ICD-10-CM

## 2012-03-18 NOTE — Procedures (Signed)
NAME:  Kelsey Diaz, Kelsey Diaz NO.:  [id number]  MEDICAL RECORD NO.:  [id number]          PATIENT TYPE:  OUT  LOCATION:  SLEEP CENTER                 FACILITY:  [facility]  PHYSICIAN:  Coralyn Helling, MD        DATE OF BIRTH:  05-Feb-1946  DATE OF STUDY:  03/07/2012                           NOCTURNAL POLYSOMNOGRAM  REFERRING PHYSICIAN:  Coralyn Helling, MD  INDICATION FOR STUDY:  [name] is a 67 year old female who has a history of hypertension and COPD.  She also reports snoring, sleep disruption, and daytime sleepiness.  She is referred to the sleep lab for further evaluation of hypersomnia with obstructive sleep apnea.  Height is 5 feet 8 inches, weight is 215 pounds.  BMI is 33.  Neck size is 14 inches.  EPWORTH SLEEPINESS SCORE:  3.  MEDICATIONS:  Irbesartan, duloxetine, bupropion, omeprazole, and albuterol.  SLEEP ARCHITECTURE:  Total recording time was 404 minutes.  Total sleep time was 63 minutes.  Sleep efficiency was 16%.  Sleep latency was 83 minutes.  The study was notable for lack of slow-wave sleep and REM sleep.  She slept in both the supine and nonsupine positions.  Of note is that she had significant difficulty with sleep initiation, sleep maintenance due to respiratory events.  RESPIRATORY DATA:  The average respiratory rate was 18.  Moderate snoring was noted by the technician.  The overall apnea/hypopnea index was 11.3.  The events were exclusively obstructive in nature.  OXYGEN DATA:  The baseline oxygenation was 98%.  The oxygen saturation nadir was 85%.  The study was conducted without the use of supplemental oxygen.  CARDIAC DATA:  The average heart rate is 89 and the rhythm strip showed sinus rhythm with occasional PVCs.  MOVEMENT-PARASOMNIA:  The periodic limb movement index was 0.  The patient had 2 restroom trips.  IMPRESSION:  This study shows evidence for mild obstructive sleep apnea with an apnea/hypopnea index of 11.3 and oxygen  saturation nadir of 85%. She has significant difficulty with sleep initiation and sleep maintenance, related to respiratory events, which were also associated with oxygen desaturation.  As a result, she likely had significantly more respiratory events, but did not meet scoring criteria, otherwise.  In addition to diet, exercise, and weight reduction, I would recommend the patient return to the sleep lab for a CPAP titration study.     Coralyn Helling, MD Diplomat, American Board of Sleep Medicine    VS/MEDQ  D:  03/17/2012 15:03:18  T:  03/18/2012 02:01:19  Job:  [id number]

## 2012-03-21 ENCOUNTER — Telehealth: Payer: Self-pay | Admitting: Pulmonary Disease

## 2012-03-21 NOTE — Telephone Encounter (Signed)
PSG 03/07/12 >> AHI 11.3, SpO2 low 85%.  Events with sleep initiation/maintenance >> likely higher score.  Will have my nurse schedule ROV to review results.

## 2012-03-23 NOTE — Telephone Encounter (Signed)
ATC NA Unable to leave VM wcb

## 2012-03-25 NOTE — Telephone Encounter (Signed)
Pt returned Mindy's call.  Set pt for an appt w/ VS on 04/15/12 @ 2:15 pm.  Pt stated nothing further needed at this time.  Antionette Fairy

## 2012-03-25 NOTE — Telephone Encounter (Signed)
lmomtcb x1 

## 2012-04-15 ENCOUNTER — Ambulatory Visit (INDEPENDENT_AMBULATORY_CARE_PROVIDER_SITE_OTHER): Payer: Medicare Other | Admitting: Pulmonary Disease

## 2012-04-15 ENCOUNTER — Encounter: Payer: Self-pay | Admitting: Pulmonary Disease

## 2012-04-15 VITALS — BP 126/82 | HR 85 | Temp 98.2°F | Ht 68.0 in | Wt 219.0 lb

## 2012-04-15 DIAGNOSIS — J449 Chronic obstructive pulmonary disease, unspecified: Secondary | ICD-10-CM

## 2012-04-15 DIAGNOSIS — G4733 Obstructive sleep apnea (adult) (pediatric): Secondary | ICD-10-CM

## 2012-04-15 NOTE — Patient Instructions (Signed)
Will arrange for CPAP set up Follow up in 8 weeks 

## 2012-04-15 NOTE — Assessment & Plan Note (Signed)
She has mild to moderate OSA.  I have reviewed her sleep test results with the patient.  Explained how sleep apnea can affect the patient's health.  Driving precautions and importance of weight loss were discussed.  Treatment options for sleep apnea were reviewed.  Will arrange for auto CPAP set up.

## 2012-04-15 NOTE — Assessment & Plan Note (Signed)
She was not able to tolerate singulair >> this caused mouth dryness.  Will continue dulera and prn albuterol.

## 2012-04-15 NOTE — Progress Notes (Signed)
Chief Complaint  Patient presents with  . Results    pt here to discuss sleep study results.     History of Present Illness: Kelsey Diaz is a 67 y.o. female former smoker with COPD/asthma, and OSA.  She is here to review her sleep study.  She was started on augmentin for a sinus infection, and has been feeling better.  This has not settled into her chest.  She stopped singulair due to mouth dryness.  She didn't think it helped much.  She uses her albuterol 0 to 3 times per day.   TESTS: PFT 10/09/11>>FEV1 2.20 (89%), FEV1% 63, TLC 5.22 (92%), DLCO 52%, no BD PSG 03/07/12 >> AHI 11.3, SpO2 low 85%. Events with sleep initiation/maintenance >> likely higher score.   Past Medical History  Diagnosis Date  . Chest pain   . Fibromyalgia   . Hypertension   . COPD (chronic obstructive pulmonary disease)   . Osteoarthritis   . GERD (gastroesophageal reflux disease)     With esophageal strictures  . Colitis   . Hyperthyroidism   . Anxiety   . Allergic rhinitis   . Asthma     Past Surgical History  Procedure Laterality Date  . Cholecystectomy      2005  . Breast biopsy      x2  . Vaginal hysterectomy    . Nasal sinus surgery      Outpatient Encounter Prescriptions as of 04/15/2012  Medication Sig Dispense Refill  . albuterol (PROAIR HFA) 108 (90 BASE) MCG/ACT inhaler Inhale 2 puffs into the lungs every 6 (six) hours as needed.      Marland Kitchen albuterol (PROVENTIL) (2.5 MG/3ML) 0.083% nebulizer solution Take 2.5 mg by nebulization every 6 (six) hours as needed.      Marland Kitchen amoxicillin-clavulanate (AUGMENTIN) 875-125 MG per tablet Take 1 tablet by mouth 2 (two) times daily.      . Calcium Carbonate-Vitamin D (CALCIUM + D) 600-200 MG-UNIT TABS Take by mouth daily.      . cyclobenzaprine (FLEXERIL) 10 MG tablet Take 10 mg by mouth 3 (three) times daily as needed for muscle spasms.      . DULoxetine (CYMBALTA) 60 MG capsule Take 60 mg by mouth daily.      . fluticasone (FLONASE) 50  MCG/ACT nasal spray Place 2 sprays into the nose daily.       Marland Kitchen HYDROcodone-homatropine (HYCODAN) 5-1.5 MG/5ML syrup Take by mouth every 6 (six) hours as needed.      . irbesartan (AVAPRO) 300 MG tablet Take 300 mg by mouth at bedtime.      Marland Kitchen loratadine (CLARITIN) 10 MG tablet Take 10 mg by mouth as needed.      . meloxicam (MOBIC) 15 MG tablet Take 15 mg by mouth daily.      . mometasone-formoterol (DULERA) 100-5 MCG/ACT AERO Inhale 2 puffs into the lungs 2 (two) times daily.  1 Inhaler  5  . Multiple Vitamin (MULTI-VITAMIN PO) Take by mouth daily.      Marland Kitchen omeprazole (PRILOSEC) 20 MG capsule Take 20 mg by mouth 2 (two) times daily.      Marland Kitchen Spacer/Aero-Holding Chambers (AEROCHAMBER MV) inhaler Use as instructed  1 each  0  . traMADol (ULTRAM) 50 MG tablet Take 50 mg by mouth every 6 (six) hours as needed.      . [DISCONTINUED] LORazepam (ATIVAN) 0.5 MG tablet Take 0.5 mg by mouth every 6 (six) hours as needed.      . [DISCONTINUED] losartan (  COZAAR) 100 MG tablet Take 100 mg by mouth daily.      . [DISCONTINUED] montelukast (SINGULAIR) 10 MG tablet Take 1 tablet (10 mg total) by mouth at bedtime.  30 tablet  5   No facility-administered encounter medications on file as of 04/15/2012.    Allergies  Allergen Reactions  . Advair Diskus (Fluticasone-Salmeterol)     Sensitivity to smells  . Ambien (Zolpidem Tartrate)     irritable  . Amlodipine Besylate     Feels bad  . Bextra (Valdecoxib)     Stomach issues  . Captopril     Ache  . Lisinopril     cough  . Morphine   . Oxycodone Hcl   . Pulmicort (Budesonide)     Worse breathing, sensitivity to smells  . Savella (Milnacipran Hcl)     Mental fog  . Spiriva (Tiotropium Bromide Monohydrate)     Dry mouth    Physical Exam:  Filed Vitals:   04/15/12 1416  BP: 126/82  Pulse: 85  Temp: 98.2 F (36.8 C)  TempSrc: Oral  Height: 5\' 8"  (1.727 m)  Weight: 219 lb (99.338 kg)  SpO2: 99%     Current Encounter SPO2  04/15/12 1416 99%   02/09/12 1047 96%  10/09/11 1606 96%     Body mass index is 33.31 kg/(m^2).   Wt Readings from Last 2 Encounters:  04/15/12 219 lb (99.338 kg)  03/07/12 215 lb (97.523 kg)     General - No distress ENT - No sinus tenderness, MP 3, no oral exudate, no LAN Cardiac - s1s2 regular, no murmur Chest - No wheeze/rales/dullness Back - No focal tenderness Abd - Soft, non-tender Ext - No edema Neuro - Normal strength Skin - No rashes Psych - normal mood, and behavior   Assessment/Plan:  Coralyn Helling, MD [place] [facility]/Critical [facility]/Sleep Pager:  [phone] 04/15/2012, 2:30 PM

## 2012-05-11 ENCOUNTER — Encounter: Payer: Self-pay | Admitting: Cardiology

## 2012-05-20 ENCOUNTER — Telehealth: Payer: Self-pay | Admitting: Pulmonary Disease

## 2012-05-20 NOTE — Telephone Encounter (Signed)
I spoke with patient about results and she verbalized understanding and had no questions 

## 2012-05-20 NOTE — Telephone Encounter (Signed)
Auto CPAP 04/27/12 to 05/12/12 >> Used on 12 of 16 nights with average 7 hrs 16 min.  Average AHI 3.4 with median CPAP 8 cm H2O and 95th percentile CPAP 12 cm H2O.  Will have my nurse inform pt that CPAP report looks good and will discuss in more detail at [facility] in April

## 2012-05-20 NOTE — Telephone Encounter (Signed)
lmomtcb x1 

## 2012-05-26 ENCOUNTER — Other Ambulatory Visit: Payer: Self-pay | Admitting: Family Medicine

## 2012-05-26 DIAGNOSIS — N63 Unspecified lump in unspecified breast: Secondary | ICD-10-CM

## 2012-06-07 ENCOUNTER — Ambulatory Visit
Admission: RE | Admit: 2012-06-07 | Discharge: 2012-06-07 | Disposition: A | Discharge status: Home / Self Care | Payer: Medicare Other | Source: Ambulatory Visit | Attending: Family Medicine | Admitting: Family Medicine

## 2012-06-07 DIAGNOSIS — N63 Unspecified lump in unspecified breast: Secondary | ICD-10-CM

## 2012-06-13 ENCOUNTER — Encounter: Payer: Self-pay | Admitting: Pulmonary Disease

## 2012-06-13 ENCOUNTER — Ambulatory Visit (INDEPENDENT_AMBULATORY_CARE_PROVIDER_SITE_OTHER): Payer: Medicare Other | Admitting: Pulmonary Disease

## 2012-06-13 VITALS — BP 122/76 | HR 90 | Temp 97.0°F | Ht 66.5 in | Wt 216.0 lb

## 2012-06-13 DIAGNOSIS — R42 Dizziness and giddiness: Secondary | ICD-10-CM

## 2012-06-13 DIAGNOSIS — J449 Chronic obstructive pulmonary disease, unspecified: Secondary | ICD-10-CM

## 2012-06-13 DIAGNOSIS — G4733 Obstructive sleep apnea (adult) (pediatric): Secondary | ICD-10-CM

## 2012-06-13 HISTORY — DX: Dizziness and giddiness: R42

## 2012-06-13 MED ORDER — IRBESARTAN 300 MG PO TABS: 300.0000 mg | ORAL_TABLET | Freq: Every day | ORAL | Status: DC

## 2012-06-13 NOTE — Patient Instructions (Signed)
Follow up in 6 months 

## 2012-06-13 NOTE — Progress Notes (Signed)
Chief Complaint  Patient presents with  . Follow-up    Pt states she is wearing her CPAP everynight. she states after 4 hrs the seal comes loose and machine makes a lot of noise.   Marland Kitchen COPD    breathing has been fair. C/o slight wheezing occasionally, just began to cough when she came in today.     History of Present Illness: Kelsey Diaz is a 67 y.o. female former smoker with COPD/asthma, and OSA.  She has been sleeping better since using CPAP.  Her main issue is mask fit.  Her liner has worn out, and she needs a new one.  She also notices that the pressure on the machine ramps up after about 5 hours, and then her masks starts leaking.  Her breathing is okay.  She gets occasional cough and wheeze, but not often.  She uses albuterol about once per week.  She has noticed feeling more fatigue (not sleepy), and dizzy when she stands up.  This has started after she had her blood pressure medicine changed.  TESTS: PFT 10/09/11>>FEV1 2.20 (89%), FEV1% 63, TLC 5.22 (92%), DLCO 52%, no BD PSG 03/07/12 >> AHI 11.3, SpO2 low 85%. Events with sleep initiation/maintenance >> likely higher score. Auto CPAP 04/27/12 to 05/12/12 >> Used on 12 of 16 nights with average 7 hrs 16 min.  Average AHI 3.4 with median CPAP 8 cm H2O and 95th percentile CPAP 12 cm H2O.  She  has a past medical history of Chest pain; Fibromyalgia; Hypertension; COPD (chronic obstructive pulmonary disease); Osteoarthritis; GERD (gastroesophageal reflux disease); Colitis; Hyperthyroidism; Anxiety; Allergic rhinitis; Asthma; and OSA (obstructive sleep apnea) (02/09/2012).  She  has past surgical history that includes Cholecystectomy; Breast biopsy; Vaginal hysterectomy; and Nasal sinus surgery.  Outpatient Encounter Prescriptions as of 06/13/2012  Medication Sig Dispense Refill  . albuterol (PROAIR HFA) 108 (90 BASE) MCG/ACT inhaler Inhale 2 puffs into the lungs every 6 (six) hours as needed.      Marland Kitchen albuterol (PROVENTIL) (2.5 MG/3ML)  0.083% nebulizer solution Take 2.5 mg by nebulization every 6 (six) hours as needed.      . Calcium Carbonate-Vitamin D (CALCIUM + D) 600-200 MG-UNIT TABS Take by mouth daily.      . cyclobenzaprine (FLEXERIL) 10 MG tablet Take 10 mg by mouth 3 (three) times daily as needed for muscle spasms.      . DULoxetine (CYMBALTA) 60 MG capsule Take 60 mg by mouth daily.      . fluticasone (FLONASE) 50 MCG/ACT nasal spray Place 2 sprays into the nose daily.       Marland Kitchen HYDROcodone-homatropine (HYCODAN) 5-1.5 MG/5ML syrup Take by mouth every 6 (six) hours as needed.      . loratadine (CLARITIN) 10 MG tablet Take 10 mg by mouth as needed.      . meloxicam (MOBIC) 15 MG tablet Take 15 mg by mouth daily.      . mometasone-formoterol (DULERA) 100-5 MCG/ACT AERO Inhale 2 puffs into the lungs 2 (two) times daily.  1 Inhaler  5  . Multiple Vitamin (MULTI-VITAMIN PO) Take by mouth daily.      Marland Kitchen omeprazole (PRILOSEC) 20 MG capsule Take 20 mg by mouth 2 (two) times daily.      Marland Kitchen Spacer/Aero-Holding Chambers (AEROCHAMBER MV) inhaler Use as instructed  1 each  0  . traMADol (ULTRAM) 50 MG tablet Take 50 mg by mouth every 6 (six) hours as needed.      . [DISCONTINUED] amoxicillin-clavulanate (AUGMENTIN) 875-125 MG  per tablet Take 1 tablet by mouth 2 (two) times daily.      . [DISCONTINUED] irbesartan (AVAPRO) 300 MG tablet Take 300 mg by mouth at bedtime.       No facility-administered encounter medications on file as of 06/13/2012.    Allergies  Allergen Reactions  . Advair Diskus (Fluticasone-Salmeterol)     Sensitivity to smells  . Ambien (Zolpidem Tartrate)     irritable  . Amlodipine Besylate     Feels bad  . Bextra (Valdecoxib)     Stomach issues  . Captopril     Ache  . Lisinopril     cough  . Morphine   . Oxycodone Hcl   . Pulmicort (Budesonide)     Worse breathing, sensitivity to smells  . Savella (Milnacipran Hcl)     Mental fog  . Spiriva (Tiotropium Bromide Monohydrate)     Dry mouth     Physical Exam:  General - No distress ENT - No sinus tenderness, MP 3, no oral exudate, no LAN Cardiac - s1s2 regular, no murmur Chest - No wheeze/rales/dullness Back - No focal tenderness Abd - Soft, non-tender Ext - No edema Neuro - Normal strength Skin - No rashes Psych - normal mood, and behavior   Assessment/Plan:  Coralyn Helling, MD [place] [facility]/Critical Care/Sleep Pager:  [phone] 06/13/2012, 1:27 PM

## 2012-06-13 NOTE — Assessment & Plan Note (Signed)
She is compliant with CPAP and reports benefit.  Will narrow her pressure range to 5 to 10 cm H2O with auto CPAP.  Will arrange for new CPAP mask.

## 2012-06-13 NOTE — Assessment & Plan Note (Signed)
She reports feeling dizzy since she was started on irbesartan.  Advised her to d/w her PCP.

## 2012-06-13 NOTE — Assessment & Plan Note (Signed)
She is stable on her current regimen of dulera and prn albuterol.  I gave her a sample of dulera.

## 2012-06-14 ENCOUNTER — Other Ambulatory Visit: Payer: Self-pay | Admitting: Family Medicine

## 2012-06-14 DIAGNOSIS — N63 Unspecified lump in unspecified breast: Secondary | ICD-10-CM

## 2012-06-24 ENCOUNTER — Other Ambulatory Visit: Payer: Self-pay | Admitting: Family Medicine

## 2012-06-24 ENCOUNTER — Ambulatory Visit
Admission: RE | Admit: 2012-06-24 | Discharge: 2012-06-24 | Disposition: A | Discharge status: Home / Self Care | Payer: Medicare Other | Source: Ambulatory Visit | Attending: Family Medicine | Admitting: Family Medicine

## 2012-06-24 DIAGNOSIS — N63 Unspecified lump in unspecified breast: Secondary | ICD-10-CM

## 2012-07-12 ENCOUNTER — Other Ambulatory Visit: Payer: Self-pay | Admitting: Pulmonary Disease

## 2012-07-14 ENCOUNTER — Other Ambulatory Visit: Payer: Self-pay | Admitting: *Deleted

## 2012-07-14 MED ORDER — MOMETASONE FURO-FORMOTEROL FUM 100-5 MCG/ACT IN AERO: 2.0000 | INHALATION_SPRAY | Freq: Two times a day (BID) | RESPIRATORY_TRACT | Status: DC

## 2012-12-07 ENCOUNTER — Other Ambulatory Visit: Payer: Self-pay | Admitting: Family Medicine

## 2012-12-07 DIAGNOSIS — N63 Unspecified lump in unspecified breast: Secondary | ICD-10-CM

## 2012-12-27 ENCOUNTER — Ambulatory Visit
Admission: RE | Admit: 2012-12-27 | Discharge: 2012-12-27 | Disposition: A | Discharge status: Home / Self Care | Payer: Medicare Other | Source: Ambulatory Visit | Attending: Family Medicine | Admitting: Family Medicine

## 2012-12-27 DIAGNOSIS — N63 Unspecified lump in unspecified breast: Secondary | ICD-10-CM

## 2012-12-28 ENCOUNTER — Other Ambulatory Visit: Payer: Self-pay | Admitting: Obstetrics and Gynecology

## 2013-06-02 ENCOUNTER — Other Ambulatory Visit: Payer: Self-pay | Admitting: Family Medicine

## 2013-06-02 DIAGNOSIS — N6320 Unspecified lump in the left breast, unspecified quadrant: Secondary | ICD-10-CM

## 2013-06-07 ENCOUNTER — Ambulatory Visit
Admission: RE | Admit: 2013-06-07 | Discharge: 2013-06-07 | Disposition: A | Discharge status: Home / Self Care | Payer: Medicare Other | Source: Ambulatory Visit | Attending: Family Medicine | Admitting: Family Medicine

## 2013-06-07 ENCOUNTER — Other Ambulatory Visit: Payer: Self-pay | Admitting: Family Medicine

## 2013-06-07 DIAGNOSIS — M25552 Pain in left hip: Secondary | ICD-10-CM

## 2013-06-07 DIAGNOSIS — M545 Low back pain, unspecified: Secondary | ICD-10-CM

## 2013-06-09 ENCOUNTER — Other Ambulatory Visit: Payer: Self-pay | Admitting: Family Medicine

## 2013-06-09 DIAGNOSIS — M545 Low back pain, unspecified: Secondary | ICD-10-CM

## 2013-06-09 DIAGNOSIS — IMO0002 Reserved for concepts with insufficient information to code with codable children: Secondary | ICD-10-CM

## 2013-06-12 ENCOUNTER — Ambulatory Visit
Admission: RE | Admit: 2013-06-12 | Discharge: 2013-06-12 | Disposition: A | Discharge status: Home / Self Care | Payer: Medicare Other | Source: Ambulatory Visit | Attending: Family Medicine | Admitting: Family Medicine

## 2013-06-12 DIAGNOSIS — IMO0002 Reserved for concepts with insufficient information to code with codable children: Secondary | ICD-10-CM

## 2013-06-12 DIAGNOSIS — M545 Low back pain, unspecified: Secondary | ICD-10-CM

## 2013-06-26 ENCOUNTER — Encounter (INDEPENDENT_AMBULATORY_CARE_PROVIDER_SITE_OTHER): Payer: Self-pay

## 2013-06-26 ENCOUNTER — Ambulatory Visit
Admission: RE | Admit: 2013-06-26 | Discharge: 2013-06-26 | Disposition: A | Discharge status: Home / Self Care | Payer: Medicare Other | Source: Ambulatory Visit | Attending: Family Medicine | Admitting: Family Medicine

## 2013-06-26 DIAGNOSIS — N6320 Unspecified lump in the left breast, unspecified quadrant: Secondary | ICD-10-CM

## 2013-09-13 ENCOUNTER — Other Ambulatory Visit: Payer: Self-pay | Admitting: Family Medicine

## 2013-09-13 ENCOUNTER — Ambulatory Visit
Admission: RE | Admit: 2013-09-13 | Discharge: 2013-09-13 | Disposition: A | Discharge status: Home / Self Care | Payer: Medicare Other | Source: Ambulatory Visit | Attending: Family Medicine | Admitting: Family Medicine

## 2013-09-13 DIAGNOSIS — M461 Sacroiliitis, not elsewhere classified: Secondary | ICD-10-CM

## 2014-01-09 ENCOUNTER — Emergency Department: Payer: Self-pay | Admitting: Emergency Medicine

## 2014-05-29 ENCOUNTER — Other Ambulatory Visit: Payer: Self-pay | Admitting: Family Medicine

## 2014-05-29 DIAGNOSIS — N6320 Unspecified lump in the left breast, unspecified quadrant: Secondary | ICD-10-CM

## 2014-06-28 ENCOUNTER — Ambulatory Visit
Admission: RE | Admit: 2014-06-28 | Discharge: 2014-06-28 | Disposition: A | Discharge status: Home / Self Care | Payer: Medicare Other | Source: Ambulatory Visit | Attending: Family Medicine | Admitting: Family Medicine

## 2014-06-28 DIAGNOSIS — N6320 Unspecified lump in the left breast, unspecified quadrant: Secondary | ICD-10-CM

## 2014-12-03 ENCOUNTER — Ambulatory Visit (INDEPENDENT_AMBULATORY_CARE_PROVIDER_SITE_OTHER): Payer: Medicare Other | Admitting: Podiatry

## 2014-12-03 ENCOUNTER — Ambulatory Visit (INDEPENDENT_AMBULATORY_CARE_PROVIDER_SITE_OTHER): Payer: Medicare Other

## 2014-12-03 ENCOUNTER — Encounter: Payer: Self-pay | Admitting: Podiatry

## 2014-12-03 VITALS — BP 124/66 | HR 87 | Resp 16

## 2014-12-03 DIAGNOSIS — M19071 Primary osteoarthritis, right ankle and foot: Secondary | ICD-10-CM

## 2014-12-03 DIAGNOSIS — M79671 Pain in right foot: Secondary | ICD-10-CM

## 2014-12-03 DIAGNOSIS — Q6651 Congenital pes planus, right foot: Secondary | ICD-10-CM | POA: Diagnosis not present

## 2014-12-03 DIAGNOSIS — M778 Other enthesopathies, not elsewhere classified: Secondary | ICD-10-CM

## 2014-12-03 DIAGNOSIS — M7751 Other enthesopathy of right foot: Secondary | ICD-10-CM

## 2014-12-03 DIAGNOSIS — M779 Enthesopathy, unspecified: Secondary | ICD-10-CM

## 2014-12-03 NOTE — Progress Notes (Signed)
   Subjective:    Patient ID: Kelsey Diaz, female    DOB: 1945-06-29, 69 y.o.   MRN: [id number]  HPI: Presents today with chief complaint of painful right foot. She states that the whole thing just hurts. It hurts all the time. She states that she has orthotics which she wears on regular basis can hardly walk without them. She is concerned about this foot and like to consider surgical intervention if possible.    Review of Systems  Constitutional: Positive for diaphoresis and fatigue.  HENT: Positive for sinus pressure.   Respiratory: Positive for shortness of breath.   Cardiovascular: Positive for leg swelling.  Gastrointestinal: Positive for abdominal pain and diarrhea.  Genitourinary: Positive for urgency.  Musculoskeletal: Positive for myalgias, back pain and arthralgias.  Neurological: Positive for weakness.  Hematological: Bruises/bleeds easily.  Psychiatric/Behavioral: The patient is nervous/anxious.   All other systems reviewed and are negative.      Objective:   Physical Exam: 69 year old white female vital signs stable she is alert and oriented 3. No acute distress. Pulses are strongly palpable. Neurologic sensorium is intact percent Semmes-Weinstein monofilament. Deep tendon reflexes are intact muscle strength +5 over 5 dorsiflexion plantar flexors and inverters everters all intrinsic musculature is intact. Orthopedic evaluation demonstrates severe pes planus of the right foot with painful frontal plane range of motion to Lisfranc's joints. She has a very palpable dorsal and plantar hypertrophic first metatarsal medial cuneiform joint resulting in chronic bursitis and irritation on the plantar aspect of the medial longitudinal arch. This area is painful with fluctuant lesion beneath the reactive hyperkeratotic tissue. There is no ulceration but the skin is mildly erythematous no cellulitis drainage or odor. Radiographs confirm hypertrophic medial plantar dorsal condyles with  severe osteoarthritic changes of Lisfranc's joints and the first metatarsal cuneiform joint.      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 Bursitis capsulitis with reactive hyperkeratosis plantar aspect first metatarsal medial cuneiform joint right foot.  Plan: Discussed etiology pathology conservative versus surgical therapies. I injected the area today with dexamethasone and local anesthesia to alleviate the bursitis. Also placed her in an aperture padding and discussed the possibility of new orthotics. We also discussed the options for major reconstructive surgery as well as minor reconstructive surgery. She will think about this and notify us as to her decision.  Roselind Messier DPM

## 2014-12-31 ENCOUNTER — Other Ambulatory Visit: Payer: Self-pay | Admitting: Gastroenterology

## 2014-12-31 DIAGNOSIS — R1032 Left lower quadrant pain: Secondary | ICD-10-CM

## 2015-01-01 ENCOUNTER — Ambulatory Visit
Admission: RE | Admit: 2015-01-01 | Discharge: 2015-01-01 | Disposition: A | Discharge status: Home / Self Care | Payer: Medicare Other | Source: Ambulatory Visit | Attending: Gastroenterology | Admitting: Gastroenterology

## 2015-01-01 DIAGNOSIS — R1032 Left lower quadrant pain: Secondary | ICD-10-CM

## 2015-01-01 MED ORDER — IOPAMIDOL (ISOVUE-300) INJECTION 61%
125.0000 mL | Freq: Once | INTRAVENOUS | Status: AC | PRN
  Administered 2015-01-01: 125 mL via INTRAVENOUS

## 2015-03-20 ENCOUNTER — Other Ambulatory Visit: Payer: Self-pay | Admitting: Gastroenterology

## 2015-10-08 LAB — PULMONARY FUNCTION TEST

## 2016-01-28 ENCOUNTER — Ambulatory Visit (INDEPENDENT_AMBULATORY_CARE_PROVIDER_SITE_OTHER): Payer: Medicare Other

## 2016-01-28 ENCOUNTER — Ambulatory Visit (INDEPENDENT_AMBULATORY_CARE_PROVIDER_SITE_OTHER): Payer: Medicare Other | Admitting: Podiatry

## 2016-01-28 DIAGNOSIS — M7751 Other enthesopathy of right foot: Secondary | ICD-10-CM | POA: Diagnosis not present

## 2016-01-28 DIAGNOSIS — M19071 Primary osteoarthritis, right ankle and foot: Secondary | ICD-10-CM | POA: Diagnosis not present

## 2016-01-28 DIAGNOSIS — R52 Pain, unspecified: Secondary | ICD-10-CM

## 2016-01-28 DIAGNOSIS — M79671 Pain in right foot: Secondary | ICD-10-CM

## 2016-01-28 DIAGNOSIS — M778 Other enthesopathies, not elsewhere classified: Secondary | ICD-10-CM

## 2016-01-28 DIAGNOSIS — M779 Enthesopathy, unspecified: Secondary | ICD-10-CM

## 2016-01-28 MED ORDER — BETAMETHASONE SOD PHOS & ACET 6 (3-3) MG/ML IJ SUSP: 3.0000 mg | Freq: Once | INTRAMUSCULAR | Status: DC

## 2016-01-28 MED ORDER — NONFORMULARY OR COMPOUNDED ITEM: 1.0000 g | Freq: Four times a day (QID) | 2 refills | Status: DC

## 2016-01-28 NOTE — Progress Notes (Signed)
Subjective:  Patient presents today for new complaint of pain and tenderness to the right foot 1 week. Patient has been experiencing throbbing with numbness tingling in his painful to walk. Patient presents today with an antalgic gait. Patient presents for further treatment and evaluation    Objective/Physical Exam General: The patient is alert and oriented x3 in no acute distress.  Dermatology: Skin is warm, dry and supple bilateral lower extremities. Negative for open lesions or macerations.  Vascular: Palpable pedal pulses bilaterally. No edema or erythema noted. Capillary refill within normal limits.  Neurological: Epicritic and protective threshold grossly intact bilaterally.   Musculoskeletal Exam: Significant pain on palpation noted to the second MPJ right foot. Range of motion within normal limits to all pedal and ankle joints bilateral. Muscle strength 5/5 in all groups bilateral.   Radiographic Exam:  Normal osseous mineralization. Joint spaces preserved. No fracture/dislocation/boney destruction. Negative for stress fracture second metatarsal   Assessment: #1 capsulitis second MPJ right foot #2 pain in right foot   Plan of Care:  #1 Patient was evaluated. #2 injection of 0.5 mL Celestone Soluspan injected in the second MPJ right foot #3 prescription for anti-inflammatory pain cream dispensed through [facility] #4 patient is currently taking Bobek 15 mg for her fibromyalgia. Continue oral anti-inflammatories #5 return to clinic when necessary   Dr. Edrick Kins, [place]

## 2016-05-14 ENCOUNTER — Encounter: Payer: Self-pay | Admitting: Anesthesiology

## 2016-05-14 ENCOUNTER — Ambulatory Visit: Payer: Medicare Other | Attending: Anesthesiology | Admitting: Anesthesiology

## 2016-05-14 VITALS — BP 129/99 | HR 83 | Temp 98.0°F | Resp 16 | Ht 67.0 in | Wt 199.0 lb

## 2016-05-14 DIAGNOSIS — G894 Chronic pain syndrome: Secondary | ICD-10-CM | POA: Diagnosis not present

## 2016-05-14 DIAGNOSIS — I10 Essential (primary) hypertension: Secondary | ICD-10-CM | POA: Insufficient documentation

## 2016-05-14 DIAGNOSIS — M797 Fibromyalgia: Secondary | ICD-10-CM | POA: Insufficient documentation

## 2016-05-14 DIAGNOSIS — Z8051 Family history of malignant neoplasm of kidney: Secondary | ICD-10-CM | POA: Insufficient documentation

## 2016-05-14 DIAGNOSIS — M5441 Lumbago with sciatica, right side: Secondary | ICD-10-CM | POA: Diagnosis not present

## 2016-05-14 DIAGNOSIS — Z79891 Long term (current) use of opiate analgesic: Secondary | ICD-10-CM | POA: Insufficient documentation

## 2016-05-14 DIAGNOSIS — Z833 Family history of diabetes mellitus: Secondary | ICD-10-CM | POA: Insufficient documentation

## 2016-05-14 DIAGNOSIS — M545 Low back pain: Secondary | ICD-10-CM | POA: Diagnosis present

## 2016-05-14 DIAGNOSIS — M5442 Lumbago with sciatica, left side: Secondary | ICD-10-CM | POA: Diagnosis not present

## 2016-05-14 DIAGNOSIS — M5136 Other intervertebral disc degeneration, lumbar region: Secondary | ICD-10-CM

## 2016-05-14 DIAGNOSIS — M4687 Other specified inflammatory spondylopathies, lumbosacral region: Secondary | ICD-10-CM | POA: Diagnosis not present

## 2016-05-14 DIAGNOSIS — M47817 Spondylosis without myelopathy or radiculopathy, lumbosacral region: Secondary | ICD-10-CM

## 2016-05-14 DIAGNOSIS — F419 Anxiety disorder, unspecified: Secondary | ICD-10-CM | POA: Insufficient documentation

## 2016-05-14 DIAGNOSIS — J449 Chronic obstructive pulmonary disease, unspecified: Secondary | ICD-10-CM | POA: Diagnosis not present

## 2016-05-14 DIAGNOSIS — M5431 Sciatica, right side: Secondary | ICD-10-CM

## 2016-05-14 DIAGNOSIS — Z79899 Other long term (current) drug therapy: Secondary | ICD-10-CM | POA: Insufficient documentation

## 2016-05-14 DIAGNOSIS — M4697 Unspecified inflammatory spondylopathy, lumbosacral region: Secondary | ICD-10-CM

## 2016-05-14 DIAGNOSIS — E0590 Thyrotoxicosis, unspecified without thyrotoxic crisis or storm: Secondary | ICD-10-CM | POA: Insufficient documentation

## 2016-05-14 DIAGNOSIS — Z8249 Family history of ischemic heart disease and other diseases of the circulatory system: Secondary | ICD-10-CM | POA: Insufficient documentation

## 2016-05-14 DIAGNOSIS — K219 Gastro-esophageal reflux disease without esophagitis: Secondary | ICD-10-CM | POA: Insufficient documentation

## 2016-05-14 DIAGNOSIS — G4733 Obstructive sleep apnea (adult) (pediatric): Secondary | ICD-10-CM | POA: Insufficient documentation

## 2016-05-14 DIAGNOSIS — M5116 Intervertebral disc disorders with radiculopathy, lumbar region: Secondary | ICD-10-CM | POA: Diagnosis not present

## 2016-05-14 DIAGNOSIS — G8929 Other chronic pain: Secondary | ICD-10-CM | POA: Diagnosis not present

## 2016-05-14 DIAGNOSIS — Z87891 Personal history of nicotine dependence: Secondary | ICD-10-CM | POA: Insufficient documentation

## 2016-05-14 DIAGNOSIS — M5432 Sciatica, left side: Secondary | ICD-10-CM

## 2016-05-14 DIAGNOSIS — Z825 Family history of asthma and other chronic lower respiratory diseases: Secondary | ICD-10-CM | POA: Diagnosis not present

## 2016-05-14 NOTE — Progress Notes (Signed)
Safety precautions to be maintained throughout the outpatient stay will include: orient to surroundings, keep bed in low position, maintain call bell within reach at all times, provide assistance with transfer out of bed and ambulation.  

## 2016-05-14 NOTE — Patient Instructions (Signed)
Epidural Steroid Injection Patient Information  Description: The epidural space surrounds the nerves as they exit the spinal cord.  In some patients, the nerves can be compressed and inflamed by a bulging disc or a tight spinal canal (spinal stenosis).  By injecting steroids into the epidural space, we can bring irritated nerves into direct contact with a potentially helpful medication.  These steroids act directly on the irritated nerves and can reduce swelling and inflammation which often leads to decreased pain.  Epidural steroids may be injected anywhere along the spine and from the neck to the low back depending upon the location of your pain.   After numbing the skin with local anesthetic (like Novocaine), a small needle is passed into the epidural space slowly.  You may experience a sensation of pressure while this is being done.  The entire block usually last less than 10 minutes.  Conditions which may be treated by epidural steroids:   Low back and leg pain  Neck and arm pain  Spinal stenosis  Post-laminectomy syndrome  Herpes zoster (shingles) pain  Pain from compression fractures  Preparation for the injection:  1. Do not eat any solid food or dairy products within 8 hours of your appointment.  2. You may drink clear liquids up to 3 hours before appointment.  Clear liquids include water, black coffee, juice or soda.  No milk or cream please. 3. You may take your regular medication, including pain medications, with a sip of water before your appointment  Diabetics should hold regular insulin (if taken separately) and take 1/2 normal NPH dos the morning of the procedure.  Carry some sugar containing items with you to your appointment. 4. A driver must accompany you and be prepared to drive you home after your procedure.  5. Bring all your current medications with your. 6. An IV may be inserted and sedation may be given at the discretion of the physician.   7. A blood pressure  cuff, EKG and other monitors will often be applied during the procedure.  Some patients may need to have extra oxygen administered for a short period. 8. You will be asked to provide medical information, including your allergies, prior to the procedure.  We must know immediately if you are taking blood thinners (like Coumadin/Warfarin)  Or if you are allergic to IV iodine contrast (dye). We must know if you could possible be pregnant.  Possible side-effects:  Bleeding from needle site  Infection (rare, may require surgery)  Nerve injury (rare)  Numbness & tingling (temporary)  Difficulty urinating (rare, temporary)  Spinal headache ( a headache worse with upright posture)  Light -headedness (temporary)  Pain at injection site (several days)  Decreased blood pressure (temporary)  Weakness in arm/leg (temporary)  Pressure sensation in back/neck (temporary)  Call if you experience:  Fever/chills associated with headache or increased back/neck pain.  Headache worsened by an upright position.  New onset weakness or numbness of an extremity below the injection site  Hives or difficulty breathing (go to the emergency room)  Inflammation or drainage at the infection site  Severe back/neck pain  Any new symptoms which are concerning to you  Please note:  Although the local anesthetic injected can often make your back or neck feel good for several hours after the injection, the pain will likely return.  It takes 3-7 days for steroids to work in the epidural space.  You may not notice any pain relief for at least that one week.    If effective, we will often do a series of three injections spaced 3-6 weeks apart to maximally decrease your pain.  After the initial series, we generally will wait several months before considering a repeat injection of the same type.  If you have any questions, please call [phone] [facility] 

## 2016-05-15 NOTE — Progress Notes (Signed)
Subjective:  Patient ID: Kelsey Diaz, female    DOB: 1945/05/11  Age: 71 y.o. MRN: [id number]  CC: Back Pain (lower left); Hip Pain (left ); Joint Pain (generalized); Shoulder Pain (between the shoulders more to the left. ); and Leg Pain (bilateral shin pain. )     PROCEDURE:None  HPI Kelsey Diaz presents for a new patient evaluation. She is a pleasant 71 year old white female with long-standing history of low back pain and lower extremity pain lasting over 5 years. 30 years ago she suffered a left hip injury and has a diagnosis of degenerative joint disease and degenerative disc disease. She has taken Norco 7.5 mg tablets 3 times a day and uses a rare Ativan for sleep. She describes a pain that is worse in the low back with radiation occasionally into the left hip but preferentially affects the right lower extremity with cramping in her calf and occasional numbness and tingling affecting the right foot greater than the left foot. Occasionally she does feel weak in the left lower extremity. She describes her pain as spasming with associated numbness and tingling and occasional weakness affecting the lower extremities. She denies problems with bowel or bladder dysfunction. Pain is described as aching and getting progressively more intense as of recent. Her maximum pain score is a 6 and best a 3 with pain that is worsening afternoon evening and after activity. It's aggravated by sitting standing and walking and better with rest and hot pack lying down and medication management. The opioids have seemed to help where is nothing else on a conservative measure has helped. She has had a previous MRI that was reviewed today. The MRI I have is as dated April 2015 showing evidence of a moderate disc generation at L2-3 with nerve root encroachment on the left at L2 and at L3-4 there is a bulge affecting the right foramen but not compressing the right L3 nerve root with multilevel facet degeneration noted  at L3-4 L4-5 and L5-S1. There is foraminal narrowing at L4-5 slightly greater on the left side.  History Kelsey Diaz has a past medical history of Allergic rhinitis; Anxiety; Asthma; Chest pain; Colitis; COPD (chronic obstructive pulmonary disease) ([place]); Fibromyalgia; GERD (gastroesophageal reflux disease); Hypertension; Hyperthyroidism; OSA (obstructive sleep apnea) (02/09/2012); and Osteoarthritis.   She has a past surgical history that includes Cholecystectomy; Breast biopsy; Vaginal hysterectomy; Nasal sinus surgery; and Knee surgery (Right, 2013).   Her family history includes Allergies in her mother; Cancer in her father; Diabetes in her mother; Emphysema in her father; Heart attack in her brother and father; Heart disease in her father; Hypertension in her mother.She reports that she quit smoking about 17 years ago. Her smoking use included Cigarettes. She has a 80.00 pack-year smoking history. She has never used smokeless tobacco. She reports that she drinks alcohol. She reports that she does not use drugs.  No results found for this or any previous visit.  No results found for: TOXASSSELUR  Outpatient Medications Prior to Visit  Medication Sig Dispense Refill  . albuterol (PROAIR HFA) 108 (90 BASE) MCG/ACT inhaler Inhale 2 puffs into the lungs every 6 (six) hours as needed.    . Calcium Carbonate-Vitamin D (CALCIUM + D) 600-200 MG-UNIT TABS Take by mouth daily.    . DULoxetine (CYMBALTA) 60 MG capsule Take 60 mg by mouth daily.    Marland Kitchen HYDROcodone-acetaminophen (NORCO) 7.5-325 MG tablet Take 1 tablet by mouth every 6 (six) hours as needed for moderate pain.    Marland Kitchen  meloxicam (MOBIC) 15 MG tablet Take 15 mg by mouth daily.    . Multiple Vitamin (MULTI-VITAMIN PO) Take by mouth daily.    . NONFORMULARY OR COMPOUNDED ITEM Apply 1-2 g topically 4 (four) times daily. 120 each 2  . Omega-3 Fatty Acids (FISH OIL PO) Take 1,000 mg by mouth.     Marland Kitchen omeprazole (PRILOSEC) 20 MG capsule Take 20 mg by mouth  2 (two) times daily.    . Ospemifene (OSPHENA PO) Take 60 mg by mouth.     Marland Kitchen PREMARIN vaginal cream 1 Applicatorful.     . valsartan-hydrochlorothiazide (DIOVAN-HCT) 160-12.5 MG tablet     . fluticasone (FLONASE) 50 MCG/ACT nasal spray Place 2 sprays into the nose daily.      Facility-Administered Medications Prior to Visit  Medication Dose Route Frequency Provider Last Rate Last Dose  . betamethasone acetate-betamethasone sodium phosphate (CELESTONE) injection 3 mg  3 mg Intramuscular Once Edrick Kins, DPM       No results found for: WBC, HGB, HCT, PLT, GLUCOSE, CHOL, TRIG, HDL, LDLDIRECT, LDLCALC, ALT, AST, NA, K, CL, CREATININE, BUN, CO2, TSH, PSA, INR, GLUF, HGBA1C, MICROALBUR  --------------------------------------------------------------------------------------------------------------------- Ct Abdomen Pelvis W Contrast  Result Date: 01/01/2015 CLINICAL DATA:  LEFT lower quadrant abdominal pain since June 2016, history of cholecystectomy and hysterectomy, COPD, hypertension, fibromyalgia, former smoker EXAM: CT ABDOMEN AND PELVIS WITH CONTRAST TECHNIQUE: Multidetector CT imaging of the abdomen and pelvis was performed using the standard protocol following bolus administration of intravenous contrast. Sagittal and coronal MPR images reconstructed from axial data set. Creatinine was obtained on site at [facility] at [address] Results: Creatinine 0.7 mg/dL.  BUN 21.  Calculated GFR 88 CONTRAST:  139mL ISOVUE-300 IOPAMIDOL (ISOVUE-300) INJECTION 61% IV. Dilute oral contrast. COMPARISON:  CT abdomen and pelvis 08/31/2008 FINDINGS: Lung bases clear. Scattered atherosclerotic calcifications aorta, iliac arteries, and coronary arteries. Minimal prominence of LEFT renal pelvis unchanged since 2010. Liver, spleen, pancreas, kidneys, and adrenal glands otherwise normal. Gallbladder and uterus surgically absent. Normal appearing appendix, urinary bladder and nonobstructed ureters.  Sigmoid and descending colonic diverticulosis without evidence of diverticulitis. Colon interposition between liver and diaphragm. Stomach and bowel loops otherwise normal appearance. Normal sized external iliac nodes. No mass, adenopathy, free air, free fluid, hernia, or inflammatory process. Bones demineralized with degenerative disc disease changes lumbar spine greatest at L2-L3. IMPRESSION: Distal colonic diverticulosis without evidence of diverticulitis. No definite acute intra-abdominal or intrapelvic abnormalities. Electronically Signed   By: Lavonia Dana M.D.   On: 01/01/2015 15:28       ---------------------------------------------------------------------------------------------------------------------- Past Medical History:  Diagnosis Date  . Allergic rhinitis   . Anxiety   . Asthma   . Chest pain   . Colitis   . COPD (chronic obstructive pulmonary disease) ([place])   . Fibromyalgia   . GERD (gastroesophageal reflux disease)    With esophageal strictures  . Hypertension   . Hyperthyroidism   . OSA (obstructive sleep apnea) 02/09/2012  . Osteoarthritis     Past Surgical History:  Procedure Laterality Date  . BREAST BIOPSY     x2  . CHOLECYSTECTOMY     2005  . KNEE SURGERY Right 2013   two torn ligaments repaired.   Marland Kitchen NASAL SINUS SURGERY    . VAGINAL HYSTERECTOMY      Family History  Problem Relation Age of Onset  . Hypertension Mother   . Diabetes Mother   . Allergies Mother   . Heart attack Father   .  Cancer Father     kidney  . Emphysema Father   . Heart disease Father   . Heart attack Brother     Social History  Substance Use Topics  . Smoking status: Former Smoker    Packs/day: 2.00    Years: 40.00    Types: Cigarettes    Quit date: 02/24/1999  . Smokeless tobacco: Never Used  . Alcohol use Yes     Comment: occassioal    ---------------------------------------------------------------------------------------------------------------------  Scheduled  Meds: Continuous Infusions: PRN Meds:.   BP (!) 129/99 (BP Location: Left Arm, Patient Position: Sitting, Cuff Size: Normal)   Pulse 83   Temp 98 F (36.7 C) (Oral)   Resp 16   Ht 5\' 7"  (1.702 m)   Wt 199 lb (90.3 kg)   SpO2 99%   BMI 31.17 kg/m    BP Readings from Last 3 Encounters:  05/14/16 (!) 129/99  12/03/14 124/66  06/13/12 122/76     Wt Readings from Last 3 Encounters:  05/14/16 199 lb (90.3 kg)  06/13/12 216 lb (98 kg)  04/15/12 219 lb (99.3 kg)     ----------------------------------------------------------------------------------------------------------------------  ROS Review of Systems  Cardiac: No chest pain Pulmonary: History of shortness of breath and bronchitis and sleep apnea Psychologic: History of depression panic attackssuicidal ideation homicidal ideation or history of drug abuse GI: No constipation Hematologic: Easy bruising but no easy bleeding Rheumatologic: As above and history of fibromyalgia    Objective:  BP (!) 129/99 (BP Location: Left Arm, Patient Position: Sitting, Cuff Size: Normal)   Pulse 83   Temp 98 F (36.7 C) (Oral)   Resp 16   Ht 5\' 7"  (1.702 m)   Wt 199 lb (90.3 kg)   SpO2 99%   BMI 31.17 kg/m   Physical Exam  Patient is alert oriented cooperative compliant Heart is regular rate and rhythm without murmur Lungs are clear to auscultation Pupils are equally round reactive to light with extraocular muscles intact cranial nerves II through X. Grossly intact Inspection low back reveals some paraspinous muscle tenderness with pain on extension and right greater than left lateral rotation With the patient supine she does have some lightly diminished flexion at the left knee compared to the right and numbness and tingling that effects the base of both feet. She does have a straight leg raise that is positive on the left and somewhat equivocal on the right     Assessment & Plan:   Kelsey Diaz was seen today for back pain,  hip pain, joint pain, shoulder pain and leg pain.  Diagnoses and all orders for this visit:  Facet arthritis of lumbosacral region ([place]) -     ToxASSURE Select 13 (MW), Urine  DDD (degenerative disc disease), lumbar -     Lumbar Epidural Injection; Future -     ToxASSURE Select 13 (MW), Urine  Bilateral sciatica -     Lumbar Epidural Injection; Future -     ToxASSURE Select 13 (MW), Urine  Chronic bilateral low back pain with bilateral sciatica -     ToxASSURE Select 13 (MW), Urine  Chronic pain syndrome -     ToxASSURE Select 13 (MW), Urine     ----------------------------------------------------------------------------------------------------------------------  Problem List Items Addressed This Visit    None    Visit Diagnoses    Facet arthritis of lumbosacral region [facility])    -  Primary   Relevant Orders   ToxASSURE Select 13 (MW), Urine   DDD (degenerative disc disease), lumbar  Relevant Orders   Lumbar Epidural Injection   ToxASSURE Select 13 (MW), Urine   Bilateral sciatica       Relevant Medications   LORazepam (ATIVAN) 0.5 MG tablet   Other Relevant Orders   Lumbar Epidural Injection   ToxASSURE Select 13 (MW), Urine   Chronic bilateral low back pain with bilateral sciatica       Relevant Orders   ToxASSURE Select 13 (MW), Urine   Chronic pain syndrome       Relevant Orders   ToxASSURE Select 13 (MW), Urine      ----------------------------------------------------------------------------------------------------------------------  1. Facet arthritis of lumbosacral region [facility]) We'll plan on an epidural steroid injection secondary to the L5 radiculitis that she is experiencing but she may be a candidate for diagnostic facet block in the future. - ToxASSURE Select 13 (MW), Urine  2. DDD (degenerative disc disease), lumbar Epidural injection at her next visit. The risks and benefits were reviewed all questions answered is made. This will be for her  L4 and L5 radiculitis left side - Lumbar Epidural Injection; Future - ToxASSURE Select 13 (MW), Urine  3. Bilateral sciatica  - Lumbar Epidural Injection; Future - ToxASSURE Select 13 (MW), Urine  4. Chronic bilateral low back pain with bilateral sciatica  - ToxASSURE Select 13 (MW), Urine  5. Chronic pain syndrome Continue with her current medication management. Urine tox screen was requested today. We will assist with her opioid management. I think it is reasonable for her to be on a Norco 5 mg tablet twice a day. I've also had a long discussion with her regarding the risks and benefits of opioid management and the need to minimize her usage of any benzodiazepines especially when taking these medications. - ToxASSURE Select 13 (MW), Urine    ----------------------------------------------------------------------------------------------------------------------  I am having Kelsey Diaz maintain her fluticasone, Calcium Carbonate-Vitamin D, Multiple Vitamin (MULTI-VITAMIN PO), meloxicam, omeprazole, DULoxetine, albuterol, Ospemifene (OSPHENA PO), Omega-3 Fatty Acids (FISH OIL PO), HYDROcodone-acetaminophen, valsartan-hydrochlorothiazide, PREMARIN, NONFORMULARY OR COMPOUNDED ITEM, LORazepam, dicyclomine, clobetasol cream, fluocinonide, and umeclidinium-vilanterol. We will continue to administer betamethasone acetate-betamethasone sodium phosphate.   Meds ordered this encounter  Medications  . LORazepam (ATIVAN) 0.5 MG tablet    Sig: Take 0.5 mg by mouth every 8 (eight) hours.  Marland Kitchen dicyclomine (BENTYL) 20 MG tablet    Sig: Take 20 mg by mouth every 6 (six) hours.  . clobetasol cream (TEMOVATE) 0.05 %    Sig: Apply 1 application topically 2 (two) times daily.  . fluocinonide (LIDEX) 0.05 % external solution    Sig: Apply 1 application topically 2 (two) times daily.  Marland Kitchen umeclidinium-vilanterol (ANORO ELLIPTA) 62.5-25 MCG/INH AEPB    Sig: Inhale 1 puff into the lungs daily.        Follow-up: Return in about 2 weeks (around 05/28/2016) for evaluation, procedure.    Molli Barrows, MD 9:40 AM  The [place] practitioner database for opioid medications on this patient has been reviewed by me and my staff   Greater than 50% of the total encounter time was spent in counseling and / or coordination of care.     This dictation was performed utilizing Systems analyst.  Please excuse any unintentional or mistaken typographical errors as a result.

## 2016-05-19 ENCOUNTER — Other Ambulatory Visit: Payer: Self-pay | Admitting: Anesthesiology

## 2016-05-21 LAB — PLEASE NOTE

## 2016-05-21 LAB — TOXASSURE SELECT 13 (MW), URINE

## 2016-09-16 ENCOUNTER — Encounter: Payer: Self-pay | Admitting: Anesthesiology

## 2016-09-16 ENCOUNTER — Ambulatory Visit: Payer: Medicare Other | Attending: Anesthesiology | Admitting: Anesthesiology

## 2016-09-16 VITALS — BP 132/83 | HR 82 | Temp 98.5°F | Resp 16 | Ht 67.0 in | Wt 196.0 lb

## 2016-09-16 DIAGNOSIS — M5441 Lumbago with sciatica, right side: Secondary | ICD-10-CM | POA: Insufficient documentation

## 2016-09-16 DIAGNOSIS — M4697 Unspecified inflammatory spondylopathy, lumbosacral region: Secondary | ICD-10-CM | POA: Diagnosis not present

## 2016-09-16 DIAGNOSIS — M47817 Spondylosis without myelopathy or radiculopathy, lumbosacral region: Secondary | ICD-10-CM

## 2016-09-16 DIAGNOSIS — M797 Fibromyalgia: Secondary | ICD-10-CM | POA: Diagnosis not present

## 2016-09-16 DIAGNOSIS — M5431 Sciatica, right side: Secondary | ICD-10-CM

## 2016-09-16 DIAGNOSIS — J309 Allergic rhinitis, unspecified: Secondary | ICD-10-CM | POA: Insufficient documentation

## 2016-09-16 DIAGNOSIS — G4733 Obstructive sleep apnea (adult) (pediatric): Secondary | ICD-10-CM | POA: Insufficient documentation

## 2016-09-16 DIAGNOSIS — J449 Chronic obstructive pulmonary disease, unspecified: Secondary | ICD-10-CM | POA: Diagnosis not present

## 2016-09-16 DIAGNOSIS — J45909 Unspecified asthma, uncomplicated: Secondary | ICD-10-CM | POA: Insufficient documentation

## 2016-09-16 DIAGNOSIS — M5432 Sciatica, left side: Secondary | ICD-10-CM

## 2016-09-16 DIAGNOSIS — Z9049 Acquired absence of other specified parts of digestive tract: Secondary | ICD-10-CM | POA: Diagnosis not present

## 2016-09-16 DIAGNOSIS — Z87891 Personal history of nicotine dependence: Secondary | ICD-10-CM | POA: Insufficient documentation

## 2016-09-16 DIAGNOSIS — M5136 Other intervertebral disc degeneration, lumbar region: Secondary | ICD-10-CM | POA: Insufficient documentation

## 2016-09-16 DIAGNOSIS — F419 Anxiety disorder, unspecified: Secondary | ICD-10-CM | POA: Insufficient documentation

## 2016-09-16 DIAGNOSIS — E0590 Thyrotoxicosis, unspecified without thyrotoxic crisis or storm: Secondary | ICD-10-CM | POA: Insufficient documentation

## 2016-09-16 DIAGNOSIS — I10 Essential (primary) hypertension: Secondary | ICD-10-CM | POA: Diagnosis not present

## 2016-09-16 DIAGNOSIS — Z90710 Acquired absence of both cervix and uterus: Secondary | ICD-10-CM | POA: Insufficient documentation

## 2016-09-16 DIAGNOSIS — G8929 Other chronic pain: Secondary | ICD-10-CM | POA: Insufficient documentation

## 2016-09-16 DIAGNOSIS — M5442 Lumbago with sciatica, left side: Secondary | ICD-10-CM | POA: Insufficient documentation

## 2016-09-16 DIAGNOSIS — G894 Chronic pain syndrome: Secondary | ICD-10-CM | POA: Diagnosis not present

## 2016-09-16 DIAGNOSIS — M1990 Unspecified osteoarthritis, unspecified site: Secondary | ICD-10-CM | POA: Diagnosis not present

## 2016-09-16 DIAGNOSIS — K219 Gastro-esophageal reflux disease without esophagitis: Secondary | ICD-10-CM | POA: Insufficient documentation

## 2016-09-16 MED ORDER — HYDROCODONE-ACETAMINOPHEN 7.5-325 MG PO TABS: 1.0000 | ORAL_TABLET | Freq: Four times a day (QID) | ORAL | 0 refills | Status: DC | PRN

## 2016-09-16 NOTE — Patient Instructions (Signed)
You were given one prescription for Hydrocodone today. Epidural Steroid Injection Patient Information  Description: The epidural space surrounds the nerves as they exit the spinal cord.  In some patients, the nerves can be compressed and inflamed by a bulging disc or a tight spinal canal (spinal stenosis).  By injecting steroids into the epidural space, we can bring irritated nerves into direct contact with a potentially helpful medication.  These steroids act directly on the irritated nerves and can reduce swelling and inflammation which often leads to decreased pain.  Epidural steroids may be injected anywhere along the spine and from the neck to the low back depending upon the location of your pain.   After numbing the skin with local anesthetic (like Novocaine), a small needle is passed into the epidural space slowly.  You may experience a sensation of pressure while this is being done.  The entire block usually last less than 10 minutes.  Conditions which may be treated by epidural steroids:   Low back and leg pain  Neck and arm pain  Spinal stenosis  Post-laminectomy syndrome  Herpes zoster (shingles) pain  Pain from compression fractures  Preparation for the injection:  1. Do not eat any solid food or dairy products within 8 hours of your appointment.  2. You may drink clear liquids up to 3 hours before appointment.  Clear liquids include water, black coffee, juice or soda.  No milk or cream please. 3. You may take your regular medication, including pain medications, with a sip of water before your appointment  Diabetics should hold regular insulin (if taken separately) and take 1/2 normal NPH dos the morning of the procedure.  Carry some sugar containing items with you to your appointment. 4. A driver must accompany you and be prepared to drive you home after your procedure.  5. Bring all your current medications with your. 6. An IV may be inserted and sedation may be given at  the discretion of the physician.   7. A blood pressure cuff, EKG and other monitors will often be applied during the procedure.  Some patients may need to have extra oxygen administered for a short period. 8. You will be asked to provide medical information, including your allergies, prior to the procedure.  We must know immediately if you are taking blood thinners (like Coumadin/Warfarin)  Or if you are allergic to IV iodine contrast (dye). We must know if you could possible be pregnant.  Possible side-effects:  Bleeding from needle site  Infection (rare, may require surgery)  Nerve injury (rare)  Numbness & tingling (temporary)  Difficulty urinating (rare, temporary)  Spinal headache ( a headache worse with upright posture)  Light -headedness (temporary)  Pain at injection site (several days)  Decreased blood pressure (temporary)  Weakness in arm/leg (temporary)  Pressure sensation in back/neck (temporary)  Call if you experience:  Fever/chills associated with headache or increased back/neck pain.  Headache worsened by an upright position.  New onset weakness or numbness of an extremity below the injection site  Hives or difficulty breathing (go to the emergency room)  Inflammation or drainage at the infection site  Severe back/neck pain  Any new symptoms which are concerning to you  Please note:  Although the local anesthetic injected can often make your back or neck feel good for several hours after the injection, the pain will likely return.  It takes 3-7 days for steroids to work in the epidural space.  You may not notice any pain  relief for at least that one week.  If effective, we will often do a series of three injections spaced 3-6 weeks apart to maximally decrease your pain.  After the initial series, we generally will wait several months before considering a repeat injection of the same type.  If you have any questions, please call [phone] [facility]

## 2016-09-16 NOTE — Progress Notes (Signed)
Nursing Pain Medication Assessment:  Safety precautions to be maintained throughout the outpatient stay will include: orient to surroundings, keep bed in low position, maintain call bell within reach at all times, provide assistance with transfer out of bed and ambulation.  Medication Inspection Compliance: Kelsey Diaz did not comply with our request to bring her pills to be counted. She was reminded that bringing the medication bottles, even when empty, is a requirement.  Medication: None brought in. Pill/Patch Count: None available to be counted. Bottle Appearance: No container available. Did not bring bottle(s) to appointment. Filled Date: N/A Last Medication intake:  Today   Patient has not been here since 05-2016, has not been instructed to bring bottle. Patient instructed to bring pill bottle to next appointment in order to receive new prescription.Marland Kitchen

## 2016-09-17 NOTE — Progress Notes (Signed)
Subjective:  Patient ID: Kelsey Diaz, female    DOB: 1945-11-16  Age: 71 y.o. MRN: [id number]  CC: Back Pain (lower)   Service Provided on Last Visit: Med Refill PROCEDURE:None  HPI HIROMI Diaz presents for reevaluation. The quality characteristic addition patient of her low back pain and leg pain have been stable in nature. No change in the quality or characteristic is noted she continues to have aching in the low back with radiation down both legs. This continues to radiate into the left hip with cramping in her calves. She also has some numbness and tingling. She has not had previous epidural steroid injections. She has been taking Norco 7.5 mg tablets effectively without significant side effects. Based on her narcotic assessment sheet this seems to derive good lifestyle functional benefit. Otherwise she states that she is in her usual state of health with no new changes in lower extremity strength or functioning at this time. Her bowel bladder function has been stable as well.  Her initial evaluation she is a pleasant 71 year old white female with long-standing history of low back pain and lower extremity pain lasting over 5 years. 30 years ago she suffered a left hip injury and has a diagnosis of degenerative joint disease and degenerative disc disease. She has taken Norco 7.5 mg tablets 3 times a day and uses a rare Ativan for sleep. She describes a pain that is worse in the low back with radiation occasionally into the left hip but preferentially affects the right lower extremity with cramping in her calf and occasional numbness and tingling affecting the right foot greater than the left foot. Occasionally she does feel weak in the left lower extremity. She describes her pain as spasming with associated numbness and tingling and occasional weakness affecting the lower extremities. She denies problems with bowel or bladder dysfunction. Pain is described as aching and getting  progressively more intense as of recent. Her maximum pain score is a 6 and best a 3 with pain that is worsening afternoon evening and after activity. It's aggravated by sitting standing and walking and better with rest and hot pack lying down and medication management. The opioids have seemed to help where is nothing else on a conservative measure has helped. She has had a previous MRI that was reviewed today. The MRI I have is as dated April 2015 showing evidence of a moderate disc generation at L2-3 with nerve root encroachment on the left at L2 and at L3-4 there is a bulge affecting the right foramen but not compressing the right L3 nerve root with multilevel facet degeneration noted at L3-4 L4-5 and L5-S1. There is foraminal narrowing at L4-5 slightly greater on the left side.  History Charron has a past medical history of Allergic rhinitis; Anxiety; Asthma; Chest pain; Colitis; COPD (chronic obstructive pulmonary disease) ([place]); Fibromyalgia; GERD (gastroesophageal reflux disease); Hypertension; Hyperthyroidism; OSA (obstructive sleep apnea) (02/09/2012); and Osteoarthritis.   She has a past surgical history that includes Cholecystectomy; Breast biopsy; Vaginal hysterectomy; Nasal sinus surgery; and Knee surgery (Right, 2013).   Her family history includes Allergies in her mother; Cancer in her father; Diabetes in her mother; Emphysema in her father; Heart attack in her brother and father; Heart disease in her father; Hypertension in her mother.She reports that she quit smoking about 17 years ago. Her smoking use included Cigarettes. She has a 80.00 pack-year smoking history. She has never used smokeless tobacco. She reports that she drinks alcohol. She reports that she  does not use drugs.  No results found for this or any previous visit.  [facility] Select 13  Date Value Ref Range Status  05/19/2016 FINAL  Final    Comment:     ==================================================================== [facility] SELECT 13 (MW) ==================================================================== Test                             Result       Flag       Units Drug Present and Declared for Prescription Verification   Lorazepam                      84           EXPECTED   ng/mg creat    Source of lorazepam is a scheduled prescription medication.   Hydrocodone                    3394         EXPECTED   ng/mg creat   Hydromorphone                  169          EXPECTED   ng/mg creat   Dihydrocodeine                 422          EXPECTED   ng/mg creat   Norhydrocodone                 >2646        EXPECTED   ng/mg creat    Sources of hydrocodone include scheduled prescription    medications. Hydromorphone, dihydrocodeine and norhydrocodone are    expected metabolites of hydrocodone. Hydromorphone and    dihydrocodeine are also available as scheduled prescription    medications. ==================================================================== Test                      Result    Flag   Units      Ref Range   Creatinine              189              mg/dL      >=20 ==================================================================== Declared Medications:  The flagging and interpretation on this report are based on the  following declared medications.  Unexpected results may arise from  inaccuracies in the declared medications.  **Note: The testing scope of this panel includes these medications:  Hydrocodone (Norco)  Lorazepam (Ativan)  **Note: The testing scope of this panel does not include following  reported medications:  Acetaminophen (Norco)  Albuterol  Calcium Carbonate  Clobetasol (Temovate)  Dicyclomine (Bentyl)  Duloxetine (Cymbalta)  Estrogen (Premarin)  Fluticasone (Flonase)  Hydrochlorothiazide (Diovan HCT)  Meloxicam (Mobic)  Omega-3 Fatty Acids  Omeprazole  Topical (Lidex)  Umeclidinium (Anoro  Ellipta)  Valsartan (Diovan HCT)  Vilanterol (Anoro Ellipta)  Vitamin D ==================================================================== For clinical consultation, please call [phone]. ====================================================================     Outpatient Medications Prior to Visit  Medication Sig Dispense Refill  . albuterol (PROAIR HFA) 108 (90 BASE) MCG/ACT inhaler Inhale 2 puffs into the lungs every 6 (six) hours as needed.    . Calcium Carbonate-Vitamin D (CALCIUM + D) 600-200 MG-UNIT TABS Take by mouth daily.    . clobetasol cream (TEMOVATE) 0.10 % Apply 1 application topically 2 (two) times daily.    . DULoxetine (  CYMBALTA) 60 MG capsule Take 60 mg by mouth daily.    . fluocinonide (LIDEX) 0.05 % external solution Apply 1 application topically 2 (two) times daily.    . fluticasone (FLONASE) 50 MCG/ACT nasal spray Place 2 sprays into the nose daily.     . meloxicam (MOBIC) 15 MG tablet Take 15 mg by mouth daily.    . Multiple Vitamin (MULTI-VITAMIN PO) Take by mouth daily.    . Omega-3 Fatty Acids (FISH OIL PO) Take 1,000 mg by mouth.     Marland Kitchen omeprazole (PRILOSEC) 20 MG capsule Take 20 mg by mouth 2 (two) times daily.    . Ospemifene (OSPHENA PO) Take 60 mg by mouth.     Marland Kitchen PREMARIN vaginal cream 1 Applicatorful.     . valsartan-hydrochlorothiazide (DIOVAN-HCT) 160-12.5 MG tablet     . HYDROcodone-acetaminophen (NORCO) 7.5-325 MG tablet Take 1 tablet by mouth every 6 (six) hours as needed for moderate pain.    Marland Kitchen dicyclomine (BENTYL) 20 MG tablet Take 20 mg by mouth every 6 (six) hours.    Marland Kitchen LORazepam (ATIVAN) 0.5 MG tablet Take 0.5 mg by mouth every 8 (eight) hours.    . NONFORMULARY OR COMPOUNDED ITEM Apply 1-2 g topically 4 (four) times daily. (Patient not taking: Reported on 09/16/2016) 120 each 2  . umeclidinium-vilanterol (ANORO ELLIPTA) 62.5-25 MCG/INH AEPB Inhale 1 puff into the lungs daily.     Facility-Administered Medications Prior to Visit   Medication Dose Route Frequency Provider Last Rate Last Dose  . betamethasone acetate-betamethasone sodium phosphate (CELESTONE) injection 3 mg  3 mg Intramuscular Once Daylene Katayama M, DPM       No results found for: WBC, HGB, HCT, PLT, GLUCOSE, CHOL, TRIG, HDL, LDLDIRECT, LDLCALC, ALT, AST, NA, K, CL, CREATININE, BUN, CO2, TSH, PSA, INR, GLUF, HGBA1C, MICROALBUR  --------------------------------------------------------------------------------------------------------------------- Ct Abdomen Pelvis W Contrast  Result Date: 01/01/2015 CLINICAL DATA:  LEFT lower quadrant abdominal pain since June 2016, history of cholecystectomy and hysterectomy, COPD, hypertension, fibromyalgia, former smoker EXAM: CT ABDOMEN AND PELVIS WITH CONTRAST TECHNIQUE: Multidetector CT imaging of the abdomen and pelvis was performed using the standard protocol following bolus administration of intravenous contrast. Sagittal and coronal MPR images reconstructed from axial data set. Creatinine was obtained on site at [facility] at [address] Results: Creatinine 0.7 mg/dL.  BUN 21.  Calculated GFR 88 CONTRAST:  164mL ISOVUE-300 IOPAMIDOL (ISOVUE-300) INJECTION 61% IV. Dilute oral contrast. COMPARISON:  CT abdomen and pelvis 08/31/2008 FINDINGS: Lung bases clear. Scattered atherosclerotic calcifications aorta, iliac arteries, and coronary arteries. Minimal prominence of LEFT renal pelvis unchanged since 2010. Liver, spleen, pancreas, kidneys, and adrenal glands otherwise normal. Gallbladder and uterus surgically absent. Normal appearing appendix, urinary bladder and nonobstructed ureters. Sigmoid and descending colonic diverticulosis without evidence of diverticulitis. Colon interposition between liver and diaphragm. Stomach and bowel loops otherwise normal appearance. Normal sized external iliac nodes. No mass, adenopathy, free air, free fluid, hernia, or inflammatory process. Bones demineralized with degenerative  disc disease changes lumbar spine greatest at L2-L3. IMPRESSION: Distal colonic diverticulosis without evidence of diverticulitis. No definite acute intra-abdominal or intrapelvic abnormalities. Electronically Signed   By: Lavonia Dana M.D.   On: 01/01/2015 15:28       ---------------------------------------------------------------------------------------------------------------------- Past Medical History:  Diagnosis Date  . Allergic rhinitis   . Anxiety   . Asthma   . Chest pain   . Colitis   . COPD (chronic obstructive pulmonary disease) ([place])   . Fibromyalgia   . GERD (  gastroesophageal reflux disease)    With esophageal strictures  . Hypertension   . Hyperthyroidism   . OSA (obstructive sleep apnea) 02/09/2012  . Osteoarthritis     Past Surgical History:  Procedure Laterality Date  . BREAST BIOPSY     x2  . CHOLECYSTECTOMY     2005  . KNEE SURGERY Right 2013   two torn ligaments repaired.   Marland Kitchen NASAL SINUS SURGERY    . VAGINAL HYSTERECTOMY      Family History  Problem Relation Age of Onset  . Hypertension Mother   . Diabetes Mother   . Allergies Mother   . Heart attack Father   . Cancer Father        kidney  . Emphysema Father   . Heart disease Father   . Heart attack Brother     Social History  Substance Use Topics  . Smoking status: Former Smoker    Packs/day: 2.00    Years: 40.00    Types: Cigarettes    Quit date: 02/24/1999  . Smokeless tobacco: Never Used  . Alcohol use Yes     Comment: occassioal    ---------------------------------------------------------------------------------------------------------------------  BP 132/83   Pulse 82   Temp 98.5 F (36.9 C) (Oral)   Resp 16   Ht 5\' 7"  (1.702 m)   Wt 196 lb (88.9 kg)   SpO2 98%   BMI 30.70 kg/m    BP Readings from Last 3 Encounters:  09/16/16 132/83  05/14/16 (!) 129/99  12/03/14 124/66     Wt Readings from Last 3 Encounters:  09/16/16 196 lb (88.9 kg)  05/14/16 199 lb (90.3  kg)  06/13/12 216 lb (98 kg)     ----------------------------------------------------------------------------------------------------------------------  ROS Review of Systems  Cardiac: No angina or shortness of breath GI: No constipation or abdominal pain  Objective:  BP 132/83   Pulse 82   Temp 98.5 F (36.9 C) (Oral)   Resp 16   Ht 5\' 7"  (1.702 m)   Wt 196 lb (88.9 kg)   SpO2 98%   BMI 30.70 kg/m   Physical Exam  Heart is regular rate and rhythm without murmur Lungs are clear to auscultation Evaluation low back reveals some mild pain with extension and good muscle tone and bulk with no change in lower extremity strength or function.    Assessment & Plan:   Kalika was seen today for back pain.  Diagnoses and all orders for this visit:  Facet arthritis of lumbosacral region [facility])  DDD (degenerative disc disease), lumbar -     Lumbar Epidural Injection; Future  Bilateral sciatica -     Lumbar Epidural Injection; Future  Chronic pain syndrome  Other orders -     HYDROcodone-acetaminophen (NORCO) 7.5-325 MG tablet; Take 1 tablet by mouth every 6 (six) hours as needed for moderate pain.     ----------------------------------------------------------------------------------------------------------------------  Problem List Items Addressed This Visit    None    Visit Diagnoses    Facet arthritis of lumbosacral region [facility])    -  Primary   Relevant Medications   HYDROcodone-acetaminophen (NORCO) 7.5-325 MG tablet   DDD (degenerative disc disease), lumbar       Relevant Medications   HYDROcodone-acetaminophen (NORCO) 7.5-325 MG tablet   Other Relevant Orders   Lumbar Epidural Injection   Bilateral sciatica       Relevant Orders   Lumbar Epidural Injection   Chronic pain syndrome          ----------------------------------------------------------------------------------------------------------------------  1.  Facet arthritis of lumbosacral region  [facility]) We have scheduled her for an epidural steroid injection. We will do this in the next month for her first injection. The risks and benefits of an reviewed with her in detail. 2. DDD (degenerative disc disease), lumbar   3. Bilateral sciatica 4. Chronic bilateral low back pain with bilateral sciatica  5. Chronic pain syndrome Going to refill her medications for 100 tablets today. This is for her opioid control of chronic low back and diffuse lower extremity pain. This regimen continues to work well for her and she has been compliant. We have reviewed to [facility] practitioner database information and it is appropriate.  ----------------------------------------------------------------------------------------------------------------------  I am having Ms. Forker maintain her fluticasone, Calcium Carbonate-Vitamin D, Multiple Vitamin (MULTI-VITAMIN PO), meloxicam, omeprazole, DULoxetine, albuterol, Ospemifene (OSPHENA PO), Omega-3 Fatty Acids (FISH OIL PO), valsartan-hydrochlorothiazide, PREMARIN, NONFORMULARY OR COMPOUNDED ITEM, LORazepam, dicyclomine, clobetasol cream, fluocinonide, umeclidinium-vilanterol, and HYDROcodone-acetaminophen. We will continue to administer betamethasone acetate-betamethasone sodium phosphate.   Meds ordered this encounter  Medications  . HYDROcodone-acetaminophen (NORCO) 7.5-325 MG tablet    Sig: Take 1 tablet by mouth every 6 (six) hours as needed for moderate pain.    Dispense:  100 tablet    Refill:  0       Follow-up: Return in about 1 month (around 10/17/2016) for evaluation, procedure.    Molli Barrows, MD 9:16 AM  The [place] practitioner database for opioid medications on this patient has been reviewed by me and my staff   Greater than 50% of the total encounter time was spent in counseling and / or coordination of care.     This dictation was performed utilizing Systems analyst.  Please excuse any unintentional or mistaken  typographical errors as a result.

## 2016-10-19 ENCOUNTER — Ambulatory Visit (HOSPITAL_BASED_OUTPATIENT_CLINIC_OR_DEPARTMENT_OTHER): Payer: Medicare Other | Admitting: Anesthesiology

## 2016-10-19 ENCOUNTER — Ambulatory Visit
Admission: RE | Admit: 2016-10-19 | Discharge: 2016-10-19 | Disposition: A | Discharge status: Home / Self Care | Payer: Medicare Other | Source: Ambulatory Visit | Attending: Anesthesiology | Admitting: Anesthesiology

## 2016-10-19 ENCOUNTER — Encounter: Payer: Self-pay | Admitting: Anesthesiology

## 2016-10-19 ENCOUNTER — Other Ambulatory Visit: Payer: Self-pay | Admitting: Anesthesiology

## 2016-10-19 VITALS — BP 126/75 | HR 85 | Temp 98.0°F | Resp 18 | Ht 67.0 in | Wt 196.0 lb

## 2016-10-19 DIAGNOSIS — R52 Pain, unspecified: Secondary | ICD-10-CM | POA: Diagnosis present

## 2016-10-19 DIAGNOSIS — M47817 Spondylosis without myelopathy or radiculopathy, lumbosacral region: Secondary | ICD-10-CM

## 2016-10-19 DIAGNOSIS — M4697 Unspecified inflammatory spondylopathy, lumbosacral region: Secondary | ICD-10-CM

## 2016-10-19 DIAGNOSIS — F1190 Opioid use, unspecified, uncomplicated: Secondary | ICD-10-CM

## 2016-10-19 DIAGNOSIS — M5432 Sciatica, left side: Secondary | ICD-10-CM

## 2016-10-19 DIAGNOSIS — M5136 Other intervertebral disc degeneration, lumbar region: Secondary | ICD-10-CM

## 2016-10-19 DIAGNOSIS — M5431 Sciatica, right side: Secondary | ICD-10-CM

## 2016-10-19 MED ORDER — ROPIVACAINE HCL 2 MG/ML IJ SOLN
INTRAMUSCULAR | Status: AC
  Filled 2016-10-19: qty 10

## 2016-10-19 MED ORDER — ROPIVACAINE HCL 2 MG/ML IJ SOLN
10.0000 mL | Freq: Once | INTRAMUSCULAR | Status: AC
  Administered 2016-10-19: 1 mL via EPIDURAL

## 2016-10-19 MED ORDER — TRIAMCINOLONE ACETONIDE 40 MG/ML IJ SUSP
40.0000 mg | Freq: Once | INTRAMUSCULAR | Status: AC
  Administered 2016-10-19: 40 mg

## 2016-10-19 MED ORDER — LACTATED RINGERS IV SOLN
1000.0000 mL | INTRAVENOUS | Status: DC
  Administered 2016-10-19: 1000 mL via INTRAVENOUS

## 2016-10-19 MED ORDER — LIDOCAINE HCL (PF) 1 % IJ SOLN
INTRAMUSCULAR | Status: AC
  Filled 2016-10-19: qty 5

## 2016-10-19 MED ORDER — SODIUM CHLORIDE 0.9% FLUSH: 10.0000 mL | Freq: Once | INTRAVENOUS | Status: DC

## 2016-10-19 MED ORDER — LIDOCAINE HCL (PF) 1 % IJ SOLN
5.0000 mL | Freq: Once | INTRAMUSCULAR | Status: AC
  Administered 2016-10-19: 5 mL via SUBCUTANEOUS

## 2016-10-19 MED ORDER — HYDROCODONE-ACETAMINOPHEN 7.5-325 MG PO TABS: 1.0000 | ORAL_TABLET | Freq: Four times a day (QID) | ORAL | 0 refills | Status: DC | PRN

## 2016-10-19 MED ORDER — IOPAMIDOL (ISOVUE-M 200) INJECTION 41%
20.0000 mL | Freq: Once | INTRAMUSCULAR | Status: DC | PRN
  Administered 2016-10-19: 10 mL
  Filled 2016-10-19: qty 20

## 2016-10-19 MED ORDER — IOPAMIDOL (ISOVUE-M 200) INJECTION 41%
INTRAMUSCULAR | Status: AC
  Filled 2016-10-19: qty 10

## 2016-10-19 MED ORDER — MIDAZOLAM HCL 5 MG/5ML IJ SOLN
5.0000 mg | Freq: Once | INTRAMUSCULAR | Status: AC
  Administered 2016-10-19: 3 mg via INTRAVENOUS

## 2016-10-19 MED ORDER — TRIAMCINOLONE ACETONIDE 40 MG/ML IJ SUSP
INTRAMUSCULAR | Status: AC
  Filled 2016-10-19: qty 1

## 2016-10-19 MED ORDER — SODIUM CHLORIDE 0.9 % IJ SOLN
INTRAMUSCULAR | Status: AC
  Filled 2016-10-19: qty 10

## 2016-10-19 MED ORDER — MIDAZOLAM HCL 5 MG/5ML IJ SOLN
INTRAMUSCULAR | Status: AC
  Filled 2016-10-19: qty 5

## 2016-10-19 NOTE — Patient Instructions (Addendum)
You were given post procedure instructions today.  You were also given a prescription for Hydrocodone x 1.    Post-procedure Information  hat to expect: Most procedures involve the use of a local anesthetic (numbing medicine), and a steroid (anti-inflammatory medicine).  The local anesthetics may cause temporary numbness and weakness of the legs or arms, depending on the location of the block. This numbness/weakness may last 4-6 hours, depending on the local anesthetic used. In rare instances, it can last up to 24 hours. While numb, you must be very careful not to injure the extremity.  After any procedure, you could expect the pain to get better within 15-20 minutes. This relief is temporary and may last 4-6 hours. Once the local anesthetics wears off, you could experience discomfort, possibly more than usual, for up to 10 (ten) days. In the case of radiofrequencies, it may last up to 6 weeks. Surgeries may take up to 8 weeks for the healing process. The discomfort is due to the irritation caused by needles going through skin and muscle. To minimize the discomfort, we recommend using ice the first day, and heat from then on. The ice should be applied for 15 minutes on, and 15 minutes off. Keep repeating this cycle until bedtime. Avoid applying the ice directly to the skin, to prevent frostbite. Heat should be used daily, until the pain improves (4-10 days). Be careful not to burn yourself.  Occasionally you may experience muscle spasms or cramps. These occur as a consequence of the irritation caused by the needle sticks to the muscle and the blood that will inevitably be lost into the surrounding muscle tissue. Blood tends to be very irritating to tissues, which tend to react by going into spasm. These spasms may start the same day of your procedure, but they may also take days to develop. This late onset type of spasm or cramp is usually caused by electrolyte imbalances triggered by the steroids, at the  level of the kidney. Cramps and spasms tend to respond well to muscle relaxants, multivitamins (some are triggered by the procedure, but may have their origins in vitamin deficiencies), and "Gatorade", or any sports drinks that can replenish any electrolyte imbalances. (If you are a diabetic, ask your pharmacist to get you a sugar-free brand.) Warm showers or baths may also be helpful. Stretching exercises are highly recommended. General Instructions:  Be alert for signs of possible infection: redness, swelling, heat, red streaks, elevated temperature, and/or fever. These typically appear 4 to 6 days after the procedure. Immediately notify your doctor if you experience unusual bleeding, difficulty breathing, or loss of bowel or bladder control. If you experience increased pain, do not increase your pain medicine intake, unless instructed by your pain physician. Post-Procedure Care:  Be careful in moving about. Muscle spasms in the area of the injection may occur. Applying ice or heat to the area is often helpful. The incidence of spinal headaches after epidural injections ranges between 1.4% and 6%. If you develop a headache that does not seem to respond to conservative therapy, please let your physician know. This can be treated with an epidural blood patch.   Post-procedure numbness or redness is to be expected, however it should average 4 to 6 hours. If numbness and weakness of your extremities begins to develop 4 to 6 hours after your procedure, and is felt to be progressing and worsening, immediately contact your physician.   Diet:  If you experience nausea, do not eat until this sensation  goes away. If you had a "Stellate Ganglion Block" for upper extremity "Reflex Sympathetic Dystrophy", do not eat or drink until your hoarseness goes away. In any case, always start with liquids first and if you tolerate them well, then slowly progress to more solid foods. Activity:  For the first 4 to 6 hours after  the procedure, use caution in moving about as you may experience numbness and/or weakness. Use caution in cooking, using household electrical appliances, and climbing steps. If you need to reach your Doctor call our office: [phone]) [phone] Monday-Thursday 8:00 am - 4:00 PM    Fridays: Closed     In case of an emergency: In case of emergency, call 911 or go to the nearest emergency room and have the physician there call us.  Interpretation of Procedure Every nerve block has two components: a diagnostic component, and a treatment component. Unrealistic expectations are the most common causes of "perceived failure".  In a perfect world, a single nerve block should be able to completely and permanently eliminate the pain. Sadly, the world is not perfect.  Most pain management nerve blocks are performed using local anesthetics and steroids. Steroids are responsible for any long-term benefit that you may experience. Their purpose is to decrease any chronic swelling that may exist in the area. Steroids begin to work immediately after being injected. However, most patients will not experience any benefits until 5 to 10 days after the injection, when the swelling has come down to the point where they can tell a difference. Steroids will only help if there is swelling to be treated. As such, they can assist with the diagnosis. If effective, they suggest an inflammatory component to the pain, and if ineffective, they rule out inflammation as the main cause or component of the problem. If the problem is one of mechanical compression, you will get no benefit from those steroids.   In the case of local anesthetics, they have a crucial role in the diagnosis of your condition. Most will begin to work within15 to 20 minutes after injection. The duration will depend on the type used (short- vs. Long-acting). It is of outmost importance that patients keep tract of their pain, after the procedure. To assist with this matter,  a "Post-procedure Pain Diary" is provided. Make sure to complete it and to bring it back to your follow-up appointment.  As long as the patient keeps accurate, detailed records of their symptoms after every procedure, and returns to have those interpreted, every procedure will provide [place] with invaluable information. Even a block that does not provide the patient with any relief, will always provide [place] with information about the mechanism and the origin of the pain. The only time a nerve block can be considered a waste of time is when patients do not keep track of the results, or do not keep their post-procedure appointment.  Reporting the results back to your physician The Pain Score  Pain is a subjective complaint. It cannot be seen, touched, or measured. We depend entirely on the patient's report of the pain in order to assess your condition and treatment. To evaluate the pain, we use a pain scale, where "0" means "No Pain", and a "10" is "the worst possible pain that you can even imagine" (i.e. something like been eaten alive by a shark or being torn apart by a lion).   You will frequently be asked to rate your pain. Please be as accurate, remember that medical decisions will be based  on your responses. Please do not rate your pain above a 10. Doing so is actually interpreted as "symptom magnification" (exaggeration), as well as lack of understanding with regards to the scale. To put this into perspective, when you tell us that your pain is at a 10 (ten), what you are saying is that there is nothing we can do to make this pain any worse. (Carefully think about that.) Post-Procedure Pain Diary   Name: Date of Service Procedure      Time Period Pain Score Painful Area  Pre-procedure ____/10   Time Period Pain Score Area improved. Area not improved.  15 to 30 min post-procedure ____/10    1st hour after procedure ____/10    2nd hour after procedure ____/10    3rd hour after procedure ____/10     4th hour after procedure ____/10    5th hour after procedure ____/10    6th hour after procedure ____/10    7th hour after procedure ____/10    Time Period Pain Score Area improved. Area not improved.  Note: From here on, always document your pain score 1st thing in the morning.  1st day after procedure ____/10    2nd day after procedure ____/10    3rd day after procedure ____/10    4th day after procedure ____/10    5th day after procedure ____/10    Time Period Pain Score Area improved. Area not improved.  10th day after procedure ____/10    20th day after procedure ____/10     Benefits Indicate for each set of activities if the procedure changes your ability to accomplish them.  Activity Worse No-Change Improved  Dressing, eating, walking, toileting, hygiene     Shopping, housekeeping, food preparation, community transportation     Range of motion of affected area      Your opinion Please indicate which statement best describes your impression of this treatment.  Statement (X)  Based on the results, I am encouraged.   Based on the results, I am disappointed.   I am not sure I have an opinion at this point.    Note: Make sure to complete and return this form to your physician, on your follow-up appointment. This information will be used to interpret the results. Failure to accurately complete, or to return this information, may result in less than optimal outcomes. Epidural Steroid Injection Patient Information  Description: The epidural space surrounds the nerves as they exit the spinal cord.  In some patients, the nerves can be compressed and inflamed by a bulging disc or a tight spinal canal (spinal stenosis).  By injecting steroids into the epidural space, we can bring irritated nerves into direct contact with a potentially helpful medication.  These steroids act directly on the irritated nerves and can reduce swelling and inflammation which often leads to decreased pain.   Epidural steroids may be injected anywhere along the spine and from the neck to the low back depending upon the location of your pain.   After numbing the skin with local anesthetic (like Novocaine), a small needle is passed into the epidural space slowly.  You may experience a sensation of pressure while this is being done.  The entire block usually last less than 10 minutes.  Conditions which may be treated by epidural steroids:   Low back and leg pain  Neck and arm pain  Spinal stenosis  Post-laminectomy syndrome  Herpes zoster (shingles) pain  Pain from compression fractures  Preparation for  the injection:  1. Do not eat any solid food or dairy products within 8 hours of your appointment.  2. You may drink clear liquids up to 3 hours before appointment.  Clear liquids include water, black coffee, juice or soda.  No milk or cream please. 3. You may take your regular medication, including pain medications, with a sip of water before your appointment  Diabetics should hold regular insulin (if taken separately) and take 1/2 normal NPH dos the morning of the procedure.  Carry some sugar containing items with you to your appointment. 4. A driver must accompany you and be prepared to drive you home after your procedure.  5. Bring all your current medications with your. 6. An IV may be inserted and sedation may be given at the discretion of the physician.   7. A blood pressure cuff, EKG and other monitors will often be applied during the procedure.  Some patients may need to have extra oxygen administered for a short period. 8. You will be asked to provide medical information, including your allergies, prior to the procedure.  We must know immediately if you are taking blood thinners (like Coumadin/Warfarin)  Or if you are allergic to IV iodine contrast (dye). We must know if you could possible be pregnant.  Possible side-effects:  Bleeding from needle site  Infection (rare, may require  surgery)  Nerve injury (rare)  Numbness & tingling (temporary)  Difficulty urinating (rare, temporary)  Spinal headache ( a headache worse with upright posture)  Light -headedness (temporary)  Pain at injection site (several days)  Decreased blood pressure (temporary)  Weakness in arm/leg (temporary)  Pressure sensation in back/neck (temporary)  Call if you experience:  Fever/chills associated with headache or increased back/neck pain.  Headache worsened by an upright position.  New onset weakness or numbness of an extremity below the injection site  Hives or difficulty breathing (go to the emergency room)  Inflammation or drainage at the infection site  Severe back/neck pain  Any new symptoms which are concerning to you  Please note:  Although the local anesthetic injected can often make your back or neck feel good for several hours after the injection, the pain will likely return.  It takes 3-7 days for steroids to work in the epidural space.  You may not notice any pain relief for at least that one week.  If effective, we will often do a series of three injections spaced 3-6 weeks apart to maximally decrease your pain.  After the initial series, we generally will wait several months before considering a repeat injection of the same type.  If you have any questions, please call [phone] [facility]

## 2016-10-19 NOTE — Progress Notes (Signed)
Nursing Pain Medication Assessment:  Safety precautions to be maintained throughout the outpatient stay will include: orient to surroundings, keep bed in low position, maintain call bell within reach at all times, provide assistance with transfer out of bed and ambulation.  Medication Inspection Compliance: Pill count conducted under aseptic conditions, in front of the patient. Neither the pills nor the bottle was removed from the patient's sight at any time. Once count was completed pills were immediately returned to the patient in their original bottle.  Medication: See above Pill/Patch Count: 34 of 100 pills remain Pill/Patch Appearance: Markings consistent with prescribed medication Bottle Appearance: Standard pharmacy container. Clearly labeled. Filled Date: 7 / 21 / 2018 Last Medication intake:  Today

## 2016-10-20 ENCOUNTER — Telehealth: Payer: Self-pay

## 2016-10-20 NOTE — Progress Notes (Signed)
Subjective:  Patient ID: Kelsey Diaz, female    DOB: Dec 04, 1945  Age: 71 y.o. MRN: [id number]  CC: Back Pain (low)     PROCEDURE:L5-S1 epidural steroid No. 1 with fluoroscopic guidance and moderate sedation  HPI Kelsey Diaz presents today for her first epidural steroid injection. The quality characteristic and distribution of her low back pain and leg pain are as previously documented without change noted today. She's been stable in this regard. She's taking her medications as prescribed and doing well with these based on her narcotic assessment sheet evaluation.   Her initial evaluation she is a pleasant 71 year old white female with long-standing history of low back pain and lower extremity pain lasting over 5 years. 30 years ago she suffered a left hip injury and has a diagnosis of degenerative joint disease and degenerative disc disease. She has taken Norco 7.5 mg tablets 3 times a day and uses a rare Ativan for sleep. She describes a pain that is worse in the low back with radiation occasionally into the left hip but preferentially affects the right lower extremity with cramping in her calf and occasional numbness and tingling affecting the right foot greater than the left foot. Occasionally she does feel weak in the left lower extremity. She describes her pain as spasming with associated numbness and tingling and occasional weakness affecting the lower extremities. She denies problems with bowel or bladder dysfunction. Pain is described as aching and getting progressively more intense as of recent. Her maximum pain score is a 6 and best a 3 with pain that is worsening afternoon evening and after activity. It's aggravated by sitting standing and walking and better with rest and hot pack lying down and medication management. The opioids have seemed to help where is nothing else on a conservative measure has helped. She has had a previous MRI that was reviewed today. The MRI I have is as  dated April 2015 showing evidence of a moderate disc generation at L2-3 with nerve root encroachment on the left at L2 and at L3-4 there is a bulge affecting the right foramen but not compressing the right L3 nerve root with multilevel facet degeneration noted at L3-4 L4-5 and L5-S1. There is foraminal narrowing at L4-5 slightly greater on the left side.  History Kelsey Diaz has a past medical history of Allergic rhinitis; Anxiety; Asthma; Chest pain; Colitis; COPD (chronic obstructive pulmonary disease) ([place]); Fibromyalgia; GERD (gastroesophageal reflux disease); Hypertension; Hyperthyroidism; OSA (obstructive sleep apnea) (02/09/2012); and Osteoarthritis.   She has a past surgical history that includes Cholecystectomy; Breast biopsy; Vaginal hysterectomy; Nasal sinus surgery; and Knee surgery (Right, 2013).   Her family history includes Allergies in her mother; Cancer in her father; Diabetes in her mother; Emphysema in her father; Heart attack in her brother and father; Heart disease in her father; Hypertension in her mother.She reports that she quit smoking about 17 years ago. Her smoking use included Cigarettes. She has a 80.00 pack-year smoking history. She has never used smokeless tobacco. She reports that she drinks alcohol. She reports that she does not use drugs.  No results found for this or any previous visit.  [facility] Select 13  Date Value Ref Range Status  05/19/2016 FINAL  Final    Comment:    ==================================================================== [facility] SELECT 13 (MW) ==================================================================== Test                             Result  Flag       Units Drug Present and Declared for Prescription Verification   Lorazepam                      84           EXPECTED   ng/mg creat    Source of lorazepam is a scheduled prescription medication.   Hydrocodone                    3394         EXPECTED   ng/mg creat   Hydromorphone                   169          EXPECTED   ng/mg creat   Dihydrocodeine                 422          EXPECTED   ng/mg creat   Norhydrocodone                 >2646        EXPECTED   ng/mg creat    Sources of hydrocodone include scheduled prescription    medications. Hydromorphone, dihydrocodeine and norhydrocodone are    expected metabolites of hydrocodone. Hydromorphone and    dihydrocodeine are also available as scheduled prescription    medications. ==================================================================== Test                      Result    Flag   Units      Ref Range   Creatinine              189              mg/dL      >=20 ==================================================================== Declared Medications:  The flagging and interpretation on this report are based on the  following declared medications.  Unexpected results may arise from  inaccuracies in the declared medications.  **Note: The testing scope of this panel includes these medications:  Hydrocodone (Norco)  Lorazepam (Ativan)  **Note: The testing scope of this panel does not include following  reported medications:  Acetaminophen (Norco)  Albuterol  Calcium Carbonate  Clobetasol (Temovate)  Dicyclomine (Bentyl)  Duloxetine (Cymbalta)  Estrogen (Premarin)  Fluticasone (Flonase)  Hydrochlorothiazide (Diovan HCT)  Meloxicam (Mobic)  Omega-3 Fatty Acids  Omeprazole  Topical (Lidex)  Umeclidinium (Anoro Ellipta)  Valsartan (Diovan HCT)  Vilanterol (Anoro Ellipta)  Vitamin D ==================================================================== For clinical consultation, please call [phone]. ====================================================================     Outpatient Medications Prior to Visit  Medication Sig Dispense Refill  . albuterol (PROAIR HFA) 108 (90 BASE) MCG/ACT inhaler Inhale 2 puffs into the lungs every 6 (six) hours as needed.    . Calcium Carbonate-Vitamin D (CALCIUM +  D) 600-200 MG-UNIT TABS Take by mouth daily.    . clobetasol cream (TEMOVATE) 9.62 % Apply 1 application topically 2 (two) times daily.    Marland Kitchen dicyclomine (BENTYL) 20 MG tablet Take 20 mg by mouth every 6 (six) hours.    . DULoxetine (CYMBALTA) 60 MG capsule Take 60 mg by mouth daily.    . fluocinonide (LIDEX) 0.05 % external solution Apply 1 application topically 2 (two) times daily.    . meloxicam (MOBIC) 15 MG tablet Take 15 mg by mouth daily.    . Multiple Vitamin (MULTI-VITAMIN PO) Take by mouth daily.    . NONFORMULARY  OR COMPOUNDED ITEM Apply 1-2 g topically 4 (four) times daily. 120 each 2  . Omega-3 Fatty Acids (FISH OIL PO) Take 1,000 mg by mouth.     Marland Kitchen omeprazole (PRILOSEC) 20 MG capsule Take 20 mg by mouth 2 (two) times daily.    . Ospemifene (OSPHENA PO) Take 60 mg by mouth.     Marland Kitchen PREMARIN vaginal cream 1 Applicatorful.     . umeclidinium-vilanterol (ANORO ELLIPTA) 62.5-25 MCG/INH AEPB Inhale 1 puff into the lungs daily.    . valsartan-hydrochlorothiazide (DIOVAN-HCT) 160-12.5 MG tablet     . HYDROcodone-acetaminophen (NORCO) 7.5-325 MG tablet Take 1 tablet by mouth every 6 (six) hours as needed for moderate pain. 100 tablet 0  . fluticasone (FLONASE) 50 MCG/ACT nasal spray Place 2 sprays into the nose daily.     Marland Kitchen LORazepam (ATIVAN) 0.5 MG tablet Take 0.5 mg by mouth every 8 (eight) hours.     Facility-Administered Medications Prior to Visit  Medication Dose Route Frequency Provider Last Rate Last Dose  . betamethasone acetate-betamethasone sodium phosphate (CELESTONE) injection 3 mg  3 mg Intramuscular Once Daylene Katayama M, DPM       No results found for: WBC, HGB, HCT, PLT, GLUCOSE, CHOL, TRIG, HDL, LDLDIRECT, LDLCALC, ALT, AST, NA, K, CL, CREATININE, BUN, CO2, TSH, PSA, INR, GLUF, HGBA1C, MICROALBUR  --------------------------------------------------------------------------------------------------------------------- Ct Abdomen Pelvis W Contrast  Result Date:  01/01/2015 CLINICAL DATA:  LEFT lower quadrant abdominal pain since June 2016, history of cholecystectomy and hysterectomy, COPD, hypertension, fibromyalgia, former smoker EXAM: CT ABDOMEN AND PELVIS WITH CONTRAST TECHNIQUE: Multidetector CT imaging of the abdomen and pelvis was performed using the standard protocol following bolus administration of intravenous contrast. Sagittal and coronal MPR images reconstructed from axial data set. Creatinine was obtained on site at [facility] at [address] Results: Creatinine 0.7 mg/dL.  BUN 21.  Calculated GFR 88 CONTRAST:  153mL ISOVUE-300 IOPAMIDOL (ISOVUE-300) INJECTION 61% IV. Dilute oral contrast. COMPARISON:  CT abdomen and pelvis 08/31/2008 FINDINGS: Lung bases clear. Scattered atherosclerotic calcifications aorta, iliac arteries, and coronary arteries. Minimal prominence of LEFT renal pelvis unchanged since 2010. Liver, spleen, pancreas, kidneys, and adrenal glands otherwise normal. Gallbladder and uterus surgically absent. Normal appearing appendix, urinary bladder and nonobstructed ureters. Sigmoid and descending colonic diverticulosis without evidence of diverticulitis. Colon interposition between liver and diaphragm. Stomach and bowel loops otherwise normal appearance. Normal sized external iliac nodes. No mass, adenopathy, free air, free fluid, hernia, or inflammatory process. Bones demineralized with degenerative disc disease changes lumbar spine greatest at L2-L3. IMPRESSION: Distal colonic diverticulosis without evidence of diverticulitis. No definite acute intra-abdominal or intrapelvic abnormalities. Electronically Signed   By: Lavonia Dana M.D.   On: 01/01/2015 15:28       ---------------------------------------------------------------------------------------------------------------------- Past Medical History:  Diagnosis Date  . Allergic rhinitis   . Anxiety   . Asthma   . Chest pain   . Colitis   . COPD (chronic obstructive  pulmonary disease) ([place])   . Fibromyalgia   . GERD (gastroesophageal reflux disease)    With esophageal strictures  . Hypertension   . Hyperthyroidism   . OSA (obstructive sleep apnea) 02/09/2012  . Osteoarthritis     Past Surgical History:  Procedure Laterality Date  . BREAST BIOPSY     x2  . CHOLECYSTECTOMY     2005  . KNEE SURGERY Right 2013   two torn ligaments repaired.   Marland Kitchen NASAL SINUS SURGERY    . VAGINAL HYSTERECTOMY  Family History  Problem Relation Age of Onset  . Hypertension Mother   . Diabetes Mother   . Allergies Mother   . Heart attack Father   . Cancer Father        kidney  . Emphysema Father   . Heart disease Father   . Heart attack Brother     Social History  Substance Use Topics  . Smoking status: Former Smoker    Packs/day: 2.00    Years: 40.00    Types: Cigarettes    Quit date: 02/24/1999  . Smokeless tobacco: Never Used  . Alcohol use Yes     Comment: occassioal    ---------------------------------------------------------------------------------------------------------------------  BP 126/75   Pulse 85   Temp 98 F (36.7 C)   Resp 18   Ht 5\' 7"  (1.702 m)   Wt 196 lb (88.9 kg)   SpO2 96%   BMI 30.70 kg/m    BP Readings from Last 3 Encounters:  10/19/16 126/75  09/16/16 132/83  05/14/16 (!) 129/99     Wt Readings from Last 3 Encounters:  10/19/16 196 lb (88.9 kg)  09/16/16 196 lb (88.9 kg)  05/14/16 199 lb (90.3 kg)     ----------------------------------------------------------------------------------------------------------------------  ROS Review of Systems  Cardiac: No angina or shortness of breath GI: No constipation or abdominal pain CNS: No sedation  Objective:  BP 126/75   Pulse 85   Temp 98 F (36.7 C)   Resp 18   Ht 5\' 7"  (1.702 m)   Wt 196 lb (88.9 kg)   SpO2 96%   BMI 30.70 kg/m   Physical Exam  Heart is regular rate and rhythm without murmur Lungs are clear to auscultation No changes on  examination are noted in the lower back and lower extremity musculature. Tone and bulk is good and exam is stable    Assessment & Plan:   Vielka was seen today for back pain.  Diagnoses and all orders for this visit:  Facet arthritis of lumbosacral region [facility])  DDD (degenerative disc disease), lumbar -     Lumbar Epidural Injection -     triamcinolone acetonide (KENALOG-40) injection 40 mg; 1 mL (40 mg total) by Other route once. -     sodium chloride flush (NS) 0.9 % injection 10 mL; 10 mLs by Other route once. -     ropivacaine (PF) 2 mg/mL (0.2%) (NAROPIN) injection 10 mL; 10 mLs by Epidural route once. -     midazolam (VERSED) 5 MG/5ML injection 5 mg; Inject 5 mLs (5 mg total) into the vein once. -     lidocaine (PF) (XYLOCAINE) 1 % injection 5 mL; Inject 5 mLs into the skin once. -     lactated ringers infusion 1,000 mL; Inject 1,000 mLs into the vein continuous. -     iopamidol (ISOVUE-M) 41 % intrathecal injection 20 mL; 20 mLs by Other route once as needed for contrast.  Bilateral sciatica -     Lumbar Epidural Injection -     triamcinolone acetonide (KENALOG-40) injection 40 mg; 1 mL (40 mg total) by Other route once. -     sodium chloride flush (NS) 0.9 % injection 10 mL; 10 mLs by Other route once. -     ropivacaine (PF) 2 mg/mL (0.2%) (NAROPIN) injection 10 mL; 10 mLs by Epidural route once. -     midazolam (VERSED) 5 MG/5ML injection 5 mg; Inject 5 mLs (5 mg total) into the vein once. -     lidocaine (  PF) (XYLOCAINE) 1 % injection 5 mL; Inject 5 mLs into the skin once. -     lactated ringers infusion 1,000 mL; Inject 1,000 mLs into the vein continuous. -     iopamidol (ISOVUE-M) 41 % intrathecal injection 20 mL; 20 mLs by Other route once as needed for contrast.  Chronic, continuous use of opioids  Other orders -     HYDROcodone-acetaminophen (NORCO) 7.5-325 MG tablet; Take 1 tablet by mouth every 6 (six) hours as needed for moderate  pain.     ----------------------------------------------------------------------------------------------------------------------  Problem List Items Addressed This Visit    None    Visit Diagnoses    Facet arthritis of lumbosacral region [facility])    -  Primary   Relevant Medications   triamcinolone acetonide (KENALOG-40) injection 40 mg (Completed)   HYDROcodone-acetaminophen (NORCO) 7.5-325 MG tablet   DDD (degenerative disc disease), lumbar       Relevant Medications   triamcinolone acetonide (KENALOG-40) injection 40 mg (Completed)   sodium chloride flush (NS) 0.9 % injection 10 mL   ropivacaine (PF) 2 mg/mL (0.2%) (NAROPIN) injection 10 mL (Completed)   midazolam (VERSED) 5 MG/5ML injection 5 mg (Completed)   lidocaine (PF) (XYLOCAINE) 1 % injection 5 mL (Completed)   lactated ringers infusion 1,000 mL   iopamidol (ISOVUE-M) 41 % intrathecal injection 20 mL   HYDROcodone-acetaminophen (NORCO) 7.5-325 MG tablet   Bilateral sciatica       Relevant Medications   triamcinolone acetonide (KENALOG-40) injection 40 mg (Completed)   sodium chloride flush (NS) 0.9 % injection 10 mL   ropivacaine (PF) 2 mg/mL (0.2%) (NAROPIN) injection 10 mL (Completed)   midazolam (VERSED) 5 MG/5ML injection 5 mg (Completed)   lidocaine (PF) (XYLOCAINE) 1 % injection 5 mL (Completed)   lactated ringers infusion 1,000 mL   iopamidol (ISOVUE-M) 41 % intrathecal injection 20 mL   Chronic, continuous use of opioids          ----------------------------------------------------------------------------------------------------------------------  1. Facet arthritis of lumbosacral region ([place])  2. DDD (degenerative disc disease), lumbar We'll proceed with her first epidural steroid injection today. The risks and benefits once again reviewed all questions answered and no guarantees were made. She is to return to clinic in approximately 1 month for reevaluation and repeat injection at that time. In the  meantime continue back stretching strengthening exercises as discussed today.   3. Bilateral sciatica 4. Chronic bilateral low back pain with bilateral sciatica  5. Chronic pain syndrome refills were given today for her hydrocodone. We reviewed the [facility] practitioner database information and it is appropriate.   ----------------------------------------------------------------------------------------------------------------------  I am having Ms. Jipson maintain her fluticasone, Calcium Carbonate-Vitamin D, Multiple Vitamin (MULTI-VITAMIN PO), meloxicam, omeprazole, DULoxetine, albuterol, Ospemifene (OSPHENA PO), Omega-3 Fatty Acids (FISH OIL PO), valsartan-hydrochlorothiazide, PREMARIN, NONFORMULARY OR COMPOUNDED ITEM, LORazepam, dicyclomine, clobetasol cream, fluocinonide, umeclidinium-vilanterol, and HYDROcodone-acetaminophen. We administered triamcinolone acetonide, ropivacaine (PF) 2 mg/mL (0.2%), midazolam, lidocaine (PF), lactated ringers, and iopamidol. We will continue to administer betamethasone acetate-betamethasone sodium phosphate.   Meds ordered this encounter  Medications  . triamcinolone acetonide (KENALOG-40) injection 40 mg  . sodium chloride flush (NS) 0.9 % injection 10 mL  . ropivacaine (PF) 2 mg/mL (0.2%) (NAROPIN) injection 10 mL  . midazolam (VERSED) 5 MG/5ML injection 5 mg  . lidocaine (PF) (XYLOCAINE) 1 % injection 5 mL  . lactated ringers infusion 1,000 mL  . iopamidol (ISOVUE-M) 41 % intrathecal injection 20 mL  . HYDROcodone-acetaminophen (NORCO) 7.5-325 MG tablet    Sig: Take 1  tablet by mouth every 6 (six) hours as needed for moderate pain.    Dispense:  90 tablet    Refill:  0    Do not fill until [id number]    Procedure: L5-S1 epidural steroid under fluoroscopic guidance and moderate sedation   Procedure: L5-S1  LESI with fluoroscopic guidance and moderate sedation  NOTE: The risks, benefits, and expectations of the procedure have been  discussed and explained to the patient who was understanding and in agreement with suggested treatment plan. No guarantees were made.  DESCRIPTION OF PROCEDURE: Lumbar epidural steroid injection with IV Versed, EKG, blood pressure, pulse, and pulse oximetry monitoring. The procedure was performed with the patient in the prone position under fluoroscopic guidance. I injected subcutaneous lidocaine overlying the L5-S1 site after its fluoroscopic identifictation.  Using strict aseptic technique, I then advanced an 18-gauge Tuohy epidural needle in the midline using interlaminar approach via loss-of-resistance to saline technique. There was negative aspiration for heme or  CSF.  I then confirmed position with both AP and Lateral fluoroscan. 2 cc of Isovue were injected and a  total of 5 mL of Preservative-Free normal saline mixed with 40 mg of Kenalog and 1cc Ropicaine 0.2 percent were injected incrementally via the  epidurally placed needle. The needle was removed. The patient tolerated the injection well and was convalesced and discharged to home in stable condition. Should the patient have any post procedure difficulty they have been instructed on how to contact us for assistance.     Follow-up: Return for procedure, evaluation.    Molli Barrows, MD 1:19 PM  The [facility] practitioner database for opioid medications on this patient has been reviewed by me and my staff   Greater than 50% of the total encounter time was spent in counseling and / or coordination of care.     This dictation was performed utilizing Systems analyst.  Please excuse any unintentional or mistaken typographical errors as a result.

## 2016-10-20 NOTE — Telephone Encounter (Signed)
Post procedue phone call.  Patient states she is doing fine.

## 2016-12-01 ENCOUNTER — Ambulatory Visit (HOSPITAL_BASED_OUTPATIENT_CLINIC_OR_DEPARTMENT_OTHER): Payer: Medicare Other | Admitting: Anesthesiology

## 2016-12-01 ENCOUNTER — Other Ambulatory Visit: Payer: Self-pay | Admitting: Anesthesiology

## 2016-12-01 ENCOUNTER — Encounter: Payer: Self-pay | Admitting: Anesthesiology

## 2016-12-01 ENCOUNTER — Ambulatory Visit
Admission: RE | Admit: 2016-12-01 | Discharge: 2016-12-01 | Disposition: A | Discharge status: Home / Self Care | Payer: Medicare Other | Source: Ambulatory Visit | Attending: Anesthesiology | Admitting: Anesthesiology

## 2016-12-01 VITALS — BP 155/80 | HR 78 | Temp 98.6°F | Resp 16 | Ht 67.0 in | Wt 191.0 lb

## 2016-12-01 DIAGNOSIS — M5441 Lumbago with sciatica, right side: Secondary | ICD-10-CM

## 2016-12-01 DIAGNOSIS — M5442 Lumbago with sciatica, left side: Secondary | ICD-10-CM

## 2016-12-01 DIAGNOSIS — R52 Pain, unspecified: Secondary | ICD-10-CM

## 2016-12-01 DIAGNOSIS — M5136 Other intervertebral disc degeneration, lumbar region: Secondary | ICD-10-CM | POA: Insufficient documentation

## 2016-12-01 DIAGNOSIS — Z791 Long term (current) use of non-steroidal anti-inflammatories (NSAID): Secondary | ICD-10-CM | POA: Insufficient documentation

## 2016-12-01 DIAGNOSIS — G894 Chronic pain syndrome: Secondary | ICD-10-CM

## 2016-12-01 DIAGNOSIS — F1190 Opioid use, unspecified, uncomplicated: Secondary | ICD-10-CM | POA: Diagnosis not present

## 2016-12-01 DIAGNOSIS — M5432 Sciatica, left side: Secondary | ICD-10-CM

## 2016-12-01 DIAGNOSIS — M5431 Sciatica, right side: Secondary | ICD-10-CM

## 2016-12-01 DIAGNOSIS — G8929 Other chronic pain: Secondary | ICD-10-CM

## 2016-12-01 DIAGNOSIS — Z79891 Long term (current) use of opiate analgesic: Secondary | ICD-10-CM | POA: Diagnosis not present

## 2016-12-01 DIAGNOSIS — Z79899 Other long term (current) drug therapy: Secondary | ICD-10-CM | POA: Diagnosis not present

## 2016-12-01 MED ORDER — HYDROCODONE-ACETAMINOPHEN 7.5-325 MG PO TABS: 1.0000 | ORAL_TABLET | Freq: Four times a day (QID) | ORAL | 0 refills | Status: DC | PRN

## 2016-12-01 NOTE — Progress Notes (Signed)
Nursing Pain Medication Assessment:  Safety precautions to be maintained throughout the outpatient stay will include: orient to surroundings, keep bed in low position, maintain call bell within reach at all times, provide assistance with transfer out of bed and ambulation.  Medication Inspection Compliance: Pill count conducted under aseptic conditions, in front of the patient. Neither the pills nor the bottle was removed from the patient's sight at any time. Once count was completed pills were immediately returned to the patient in their original bottle.  Medication: Hydrocodone/APAP Pill/Patch Count: No pills available to be counted. Pill/Patch Appearance:  Bottle Appearance: Standard pharmacy container. Clearly labeled. Filled Date: Last Medication intake:  Today

## 2016-12-01 NOTE — Progress Notes (Signed)
Subjective:  Patient ID: Kelsey Diaz, female    DOB: [birth year]  Age: 71 y.o. MRN: [id number]  CC: Back Pain (lower)   Procedure: None   HPI Kelsey Diaz presents for reevaluation. She was last seen about a month ago and had her first epidural injection at that time. This was in late August. She states that since that time her leg pain has resolved. She still having considerable low back pain. This is of the same quality characteristic and distribution. No change in lower extremity strength or function or bowel bladder function is noted at this time. She still taking her Vicodin 7.5 mg tablets and based on her narcotic assessment sheet these are well received with minimal side effect in good lifestyle functional improvement.  Outpatient Medications Prior to Visit  Medication Sig Dispense Refill  . albuterol (PROAIR HFA) 108 (90 BASE) MCG/ACT inhaler Inhale 2 puffs into the lungs every 6 (six) hours as needed.    . Calcium Carbonate-Vitamin D (CALCIUM + D) 600-200 MG-UNIT TABS Take by mouth daily.    . clobetasol cream (TEMOVATE) 3.61 % Apply 1 application topically 2 (two) times daily.    . DULoxetine (CYMBALTA) 60 MG capsule Take 60 mg by mouth daily.    . fluocinonide (LIDEX) 0.05 % external solution Apply 1 application topically 2 (two) times daily.    . meloxicam (MOBIC) 15 MG tablet Take 15 mg by mouth daily.    . Multiple Vitamin (MULTI-VITAMIN PO) Take by mouth daily.    . NONFORMULARY OR COMPOUNDED ITEM Apply 1-2 g topically 4 (four) times daily. 120 each 2  . Omega-3 Fatty Acids (FISH OIL PO) Take 1,000 mg by mouth.     Marland Kitchen omeprazole (PRILOSEC) 20 MG capsule Take 20 mg by mouth 2 (two) times daily.    . Ospemifene (OSPHENA PO) Take 60 mg by mouth.     Marland Kitchen PREMARIN vaginal cream 1 Applicatorful.     . umeclidinium-vilanterol (ANORO ELLIPTA) 62.5-25 MCG/INH AEPB Inhale 1 puff into the lungs daily.    . valsartan-hydrochlorothiazide (DIOVAN-HCT) 160-12.5 MG tablet     .  HYDROcodone-acetaminophen (NORCO) 7.5-325 MG tablet Take 1 tablet by mouth every 6 (six) hours as needed for moderate pain. 90 tablet 0  . dicyclomine (BENTYL) 20 MG tablet Take 20 mg by mouth every 6 (six) hours.    . fluticasone (FLONASE) 50 MCG/ACT nasal spray Place 2 sprays into the nose daily.     Marland Kitchen LORazepam (ATIVAN) 0.5 MG tablet Take 0.5 mg by mouth every 8 (eight) hours.     Facility-Administered Medications Prior to Visit  Medication Dose Route Frequency Provider Last Rate Last Dose  . betamethasone acetate-betamethasone sodium phosphate (CELESTONE) injection 3 mg  3 mg Intramuscular Once Edrick Kins, DPM        Review of Systems CNS: No confusion or sedation Constitutional no fever or chills GI: No constipation or abdominal pain  Objective:  BP (!) 155/80 (BP Location: Left Arm, Patient Position: Sitting, Cuff Size: Normal)   Pulse 78   Temp 98.6 F (37 C) (Oral)   Resp 16   Ht 5\' 7"  (1.702 m)   Wt 191 lb (86.6 kg)   SpO2 97%   BMI 29.91 kg/m    BP Readings from Last 3 Encounters:  12/01/16 (!) 155/80  10/19/16 126/75  09/16/16 132/83     Wt Readings from Last 3 Encounters:  12/01/16 191 lb (86.6 kg)  10/19/16 196 lb (88.9 kg)  09/16/16 196 lb (88.9 kg)     Physical Exam Pt is alert and oriented PERRL EOMI HEART IS RRR no murmur or rub LCTA no wheezing or rhales MUSCULOSKELETALReveals some persistent paraspinous muscle tenderness but no overt trigger points. She does have a positive straight leg raise on the left negative on the right and walks with a mildly antalgic gait.  Labs  No results found for: HGBA1C No results found for: GLUF, MICROALBUR, LDLCALC, CREATININE  -------------------------------------------------------------------------------------------------------------------- No results found for: WBC, HGB, HCT, PLT, GLUCOSE, CHOL, TRIG, HDL, LDLDIRECT, LDLCALC, ALT, AST, NA, K, CL, CREATININE, BUN, CO2, TSH, PSA, INR, GLUF, HGBA1C,  MICROALBUR  --------------------------------------------------------------------------------------------------------------------- No results found.   Assessment & Plan:   Kelsey Diaz was seen today for back pain.  Diagnoses and all orders for this visit:  DDD (degenerative disc disease), lumbar  Bilateral sciatica  Chronic, continuous use of opioids  Chronic pain syndrome  Chronic bilateral low back pain with bilateral sciatica  Other orders -     HYDROcodone-acetaminophen (NORCO) 7.5-325 MG tablet; Take 1 tablet by mouth every 6 (six) hours as needed for moderate pain.        ----------------------------------------------------------------------------------------------------------------------  Problem List Items Addressed This Visit    None    Visit Diagnoses    DDD (degenerative disc disease), lumbar    -  Primary   Relevant Medications   HYDROcodone-acetaminophen (NORCO) 7.5-325 MG tablet   Bilateral sciatica       Chronic, continuous use of opioids       Chronic pain syndrome       Chronic bilateral low back pain with bilateral sciatica       Relevant Medications   HYDROcodone-acetaminophen (NORCO) 7.5-325 MG tablet        ----------------------------------------------------------------------------------------------------------------------  1. DDD (degenerative disc disease), lumbar Our plan was to proceed with a repeat epidural injection today however she had a flu shot yesterday. Subsequently, we will reschedule this for 2 weeks from now. In the meantime on her to continue with her current medication regimen and refill her Vicodin today.  2. Bilateral sciatica Continue with core stretching strengthening exercises.  3. Chronic, continuous use of opioids As above. We have reviewed the [facility] practitioner database information and it is appropriate.  4. Chronic pain syndrome   5. Chronic bilateral low back pain with bilateral  sciatica     ----------------------------------------------------------------------------------------------------------------------  I am having Kelsey Diaz maintain her fluticasone, Calcium Carbonate-Vitamin D, Multiple Vitamin (MULTI-VITAMIN PO), meloxicam, omeprazole, DULoxetine, albuterol, Ospemifene (OSPHENA PO), Omega-3 Fatty Acids (FISH OIL PO), valsartan-hydrochlorothiazide, PREMARIN, NONFORMULARY OR COMPOUNDED ITEM, LORazepam, dicyclomine, clobetasol cream, fluocinonide, umeclidinium-vilanterol, and HYDROcodone-acetaminophen. We will continue to administer betamethasone acetate-betamethasone sodium phosphate.   Meds ordered this encounter  Medications  . HYDROcodone-acetaminophen (NORCO) 7.5-325 MG tablet    Sig: Take 1 tablet by mouth every 6 (six) hours as needed for moderate pain.    Dispense:  90 tablet    Refill:  0    Do not fill until [id number]   Patient's Medications  New Prescriptions   No medications on file  Previous Medications   ALBUTEROL (PROAIR HFA) 108 (90 BASE) MCG/ACT INHALER    Inhale 2 puffs into the lungs every 6 (six) hours as needed.   CALCIUM CARBONATE-VITAMIN D (CALCIUM + D) 600-200 MG-UNIT TABS    Take by mouth daily.   CLOBETASOL CREAM (TEMOVATE) 0.05 %    Apply 1 application topically 2 (two) times daily.   DICYCLOMINE (BENTYL) 20 MG TABLET  Take 20 mg by mouth every 6 (six) hours.   DULOXETINE (CYMBALTA) 60 MG CAPSULE    Take 60 mg by mouth daily.   FLUOCINONIDE (LIDEX) 0.05 % EXTERNAL SOLUTION    Apply 1 application topically 2 (two) times daily.   FLUTICASONE (FLONASE) 50 MCG/ACT NASAL SPRAY    Place 2 sprays into the nose daily.    LORAZEPAM (ATIVAN) 0.5 MG TABLET    Take 0.5 mg by mouth every 8 (eight) hours.   MELOXICAM (MOBIC) 15 MG TABLET    Take 15 mg by mouth daily.   MULTIPLE VITAMIN (MULTI-VITAMIN PO)    Take by mouth daily.   NONFORMULARY OR COMPOUNDED ITEM    Apply 1-2 g topically 4 (four) times daily.   OMEGA-3 FATTY ACIDS (FISH OIL  PO)    Take 1,000 mg by mouth.    OMEPRAZOLE (PRILOSEC) 20 MG CAPSULE    Take 20 mg by mouth 2 (two) times daily.   OSPEMIFENE (OSPHENA PO)    Take 60 mg by mouth.    PREMARIN VAGINAL CREAM    1 Applicatorful.    UMECLIDINIUM-VILANTEROL (ANORO ELLIPTA) 62.5-25 MCG/INH AEPB    Inhale 1 puff into the lungs daily.   VALSARTAN-HYDROCHLOROTHIAZIDE (DIOVAN-HCT) 160-12.5 MG TABLET      Modified Medications   Modified Medication Previous Medication   HYDROCODONE-ACETAMINOPHEN (NORCO) 7.5-325 MG TABLET HYDROcodone-acetaminophen (NORCO) 7.5-325 MG tablet      Take 1 tablet by mouth every 6 (six) hours as needed for moderate pain.    Take 1 tablet by mouth every 6 (six) hours as needed for moderate pain.  Discontinued Medications   No medications on file   ----------------------------------------------------------------------------------------------------------------------  Follow-up: Return in about 2 weeks (around 12/15/2016) for evaluation, procedure.    Molli Barrows, MD

## 2016-12-02 ENCOUNTER — Telehealth: Payer: Self-pay | Admitting: *Deleted

## 2016-12-02 NOTE — Telephone Encounter (Signed)
Called patient for post procedure follow-up. Patient states she did not have a procedure.

## 2016-12-15 ENCOUNTER — Ambulatory Visit: Payer: Medicare Other | Admitting: Anesthesiology

## 2016-12-16 ENCOUNTER — Other Ambulatory Visit: Payer: Self-pay | Admitting: Anesthesiology

## 2016-12-16 ENCOUNTER — Encounter: Payer: Self-pay | Admitting: Anesthesiology

## 2016-12-16 ENCOUNTER — Ambulatory Visit (HOSPITAL_BASED_OUTPATIENT_CLINIC_OR_DEPARTMENT_OTHER): Payer: Medicare Other | Admitting: Anesthesiology

## 2016-12-16 ENCOUNTER — Ambulatory Visit
Admission: RE | Admit: 2016-12-16 | Discharge: 2016-12-16 | Disposition: A | Discharge status: Home / Self Care | Payer: Medicare Other | Source: Ambulatory Visit | Attending: Anesthesiology | Admitting: Anesthesiology

## 2016-12-16 VITALS — BP 136/91 | HR 83 | Temp 98.0°F | Resp 13 | Ht 67.5 in | Wt 191.0 lb

## 2016-12-16 DIAGNOSIS — Z7951 Long term (current) use of inhaled steroids: Secondary | ICD-10-CM | POA: Insufficient documentation

## 2016-12-16 DIAGNOSIS — Z79899 Other long term (current) drug therapy: Secondary | ICD-10-CM | POA: Diagnosis not present

## 2016-12-16 DIAGNOSIS — M5442 Lumbago with sciatica, left side: Secondary | ICD-10-CM

## 2016-12-16 DIAGNOSIS — R52 Pain, unspecified: Secondary | ICD-10-CM

## 2016-12-16 DIAGNOSIS — M5441 Lumbago with sciatica, right side: Secondary | ICD-10-CM | POA: Diagnosis not present

## 2016-12-16 DIAGNOSIS — M5136 Other intervertebral disc degeneration, lumbar region: Secondary | ICD-10-CM | POA: Diagnosis not present

## 2016-12-16 DIAGNOSIS — M5432 Sciatica, left side: Secondary | ICD-10-CM

## 2016-12-16 DIAGNOSIS — G894 Chronic pain syndrome: Secondary | ICD-10-CM

## 2016-12-16 DIAGNOSIS — M545 Low back pain: Secondary | ICD-10-CM | POA: Diagnosis present

## 2016-12-16 DIAGNOSIS — Z79891 Long term (current) use of opiate analgesic: Secondary | ICD-10-CM | POA: Diagnosis not present

## 2016-12-16 DIAGNOSIS — G8929 Other chronic pain: Secondary | ICD-10-CM

## 2016-12-16 DIAGNOSIS — M5431 Sciatica, right side: Secondary | ICD-10-CM

## 2016-12-16 DIAGNOSIS — F1190 Opioid use, unspecified, uncomplicated: Secondary | ICD-10-CM

## 2016-12-16 MED ORDER — IOPAMIDOL (ISOVUE-M 200) INJECTION 41%
20.0000 mL | Freq: Once | INTRAMUSCULAR | Status: DC | PRN
  Administered 2016-12-16: 20 mL
  Filled 2016-12-16: qty 20

## 2016-12-16 MED ORDER — LIDOCAINE HCL (PF) 1 % IJ SOLN
5.0000 mL | Freq: Once | INTRAMUSCULAR | Status: AC
  Administered 2016-12-16: 5 mL via SUBCUTANEOUS
  Filled 2016-12-16: qty 5

## 2016-12-16 MED ORDER — HYDROCODONE-ACETAMINOPHEN 7.5-325 MG PO TABS: 1.0000 | ORAL_TABLET | Freq: Four times a day (QID) | ORAL | 0 refills | Status: DC | PRN

## 2016-12-16 MED ORDER — SODIUM CHLORIDE 0.9% FLUSH
10.0000 mL | Freq: Once | INTRAVENOUS | Status: AC
  Administered 2016-12-16: 10 mL

## 2016-12-16 MED ORDER — ROPIVACAINE HCL 2 MG/ML IJ SOLN
10.0000 mL | Freq: Once | INTRAMUSCULAR | Status: AC
  Administered 2016-12-16: 10 mL via EPIDURAL
  Filled 2016-12-16: qty 10

## 2016-12-16 MED ORDER — TRIAMCINOLONE ACETONIDE 40 MG/ML IJ SUSP
40.0000 mg | Freq: Once | INTRAMUSCULAR | Status: AC
  Administered 2016-12-16: 40 mg
  Filled 2016-12-16: qty 1

## 2016-12-16 NOTE — Patient Instructions (Addendum)
You have been given a script for Hydrocodone x 1    Preparing for your procedure (without sedation) Instructions: . Oral Intake: Do not eat or drink anything for at least 3 hours prior to your procedure. . Transportation: Unless otherwise stated by your physician, you may drive yourself after the procedure. . Blood Pressure Medicine: Take your blood pressure medicine with a sip of water the morning of the procedure. . Insulin: Take only  of your normal insulin dose. . Preventing infections: Shower with an antibacterial soap the morning of your procedure. . Build-up your immune system: Take 1000 mg of Vitamin C with every meal (3 times a day) the day prior to your procedure. . Pregnancy: If you are pregnant, call and cancel the procedure. . Sickness: If you have a cold, fever, or any active infections, call and cancel the procedure. . Arrival: You must be in the facility at least 30 minutes prior to your scheduled procedure. . Children: Do not bring any children with you. . Dress appropriately: Bring dark clothing that you would not mind if they get stained. . Valuables: Do not bring any jewelry or valuables. Procedure appointments are reserved for interventional treatments only. Marland Kitchen No Prescription Refills. . No medication changes will be discussed during procedure appointments. No disability issues will be discussed.Epidural Steroid Injection Patient Information  Description: The epidural space surrounds the nerves as they exit the spinal cord.  In some patients, the nerves can be compressed and inflamed by a bulging disc or a tight spinal canal (spinal stenosis).  By injecting steroids into the epidural space, we can bring irritated nerves into direct contact with a potentially helpful medication.  These steroids act directly on the irritated nerves and can reduce swelling and inflammation which often leads to decreased pain.  Epidural steroids may be injected anywhere along the  spine and from the neck to the low back depending upon the location of your pain.   After numbing the skin with local anesthetic (like Novocaine), a small needle is passed into the epidural space slowly.  You may experience a sensation of pressure while this is being done.  The entire block usually last less than 10 minutes.  Conditions which may be treated by epidural steroids:  Low back and leg pain Neck and arm pain Spinal stenosis Post-laminectomy syndrome Herpes zoster (shingles) pain Pain from compression fractures  Preparation for the injection:  Do not eat any solid food or dairy products within 8 hours of your appointment.  You may drink clear liquids up to 3 hours before appointment.  Clear liquids include water, black coffee, juice or soda.  No milk or cream please. You may take your regular medication, including pain medications, with a sip of water before your appointment  Diabetics should hold regular insulin (if taken separately) and take 1/2 normal NPH dos the morning of the procedure.  Carry some sugar containing items with you to your appointment. A driver must accompany you and be prepared to drive you home after your procedure.  Bring all your current medications with your. An IV may be inserted and sedation may be given at the discretion of the physician.   A blood pressure cuff, EKG and other monitors will often be applied during the procedure.  Some patients may need to have extra oxygen administered for a short period. You will be asked to provide medical information, including your allergies, prior to the procedure.  We must know immediately if you  are taking blood thinners (like Coumadin/Warfarin)  Or if you are allergic to IV iodine contrast (dye). We must know if you could possible be pregnant.  Possible side-effects: Bleeding from needle site Infection (rare, may require surgery) Nerve injury (rare) Numbness & tingling (temporary) Difficulty urinating (rare,  temporary) Spinal headache ( a headache worse with upright posture) Light -headedness (temporary) Pain at injection site (several days) Decreased blood pressure (temporary) Weakness in arm/leg (temporary) Pressure sensation in back/neck (temporary)  Call if you experience: Fever/chills associated with headache or increased back/neck pain. Headache worsened by an upright position. New onset weakness or numbness of an extremity below the injection site Hives or difficulty breathing (go to the emergency room) Inflammation or drainage at the infection site Severe back/neck pain Any new symptoms which are concerning to you  Please note:  Although the local anesthetic injected can often make your back or neck feel good for several hours after the injection, the pain will likely return.  It takes 3-7 days for steroids to work in the epidural space.  You may not notice any pain relief for at least that one week.  If effective, we will often do a series of three injections spaced 3-6 weeks apart to maximally decrease your pain.  After the initial series, we generally will wait several months before considering a repeat injection of the same type.  If you have any questions, please call [phone] . [facility]

## 2016-12-16 NOTE — Progress Notes (Signed)
Subjective:  Patient ID: Kelsey Diaz, female    DOB: [birth year]  Age: 71 y.o. MRN: [id number]  CC: Back Pain (lower)   Procedure: L5-S1 epidural steroid under fluoroscopic guidance without sedation #2   HPI Kelsey Diaz presents for reevaluation. She continues to have pain in the low back which is her primary complaint. She's having some right side posterior lateral radiating pain into the right foot and calf which is diminished from her initial presentation and it has resolved on the left side. No change in lower extremity strength or function are noted at this time. She is taking Vicodin 7.5 mg tablets 3 times a day as prescribed with no diverting or illicit use. Based on her narcotic assessment sheet these continue to give her good overall lifestyle functional improvement. Otherwise she is in her usual state of health at this time.  Outpatient Medications Prior to Visit  Medication Sig Dispense Refill  . albuterol (PROAIR HFA) 108 (90 BASE) MCG/ACT inhaler Inhale 2 puffs into the lungs every 6 (six) hours as needed.    . Calcium Carbonate-Vitamin D (CALCIUM + D) 600-200 MG-UNIT TABS Take by mouth daily.    . clobetasol cream (TEMOVATE) 7.82 % Apply 1 application topically 2 (two) times daily.    Marland Kitchen dicyclomine (BENTYL) 20 MG tablet Take 20 mg by mouth every 6 (six) hours.    . DULoxetine (CYMBALTA) 60 MG capsule Take 60 mg by mouth daily.    . fluocinonide (LIDEX) 0.05 % external solution Apply 1 application topically 2 (two) times daily.    . meloxicam (MOBIC) 15 MG tablet Take 15 mg by mouth daily.    . Multiple Vitamin (MULTI-VITAMIN PO) Take by mouth daily.    . NONFORMULARY OR COMPOUNDED ITEM Apply 1-2 g topically 4 (four) times daily. 120 each 2  . Omega-3 Fatty Acids (FISH OIL PO) Take 1,000 mg by mouth.     Marland Kitchen omeprazole (PRILOSEC) 20 MG capsule Take 20 mg by mouth 2 (two) times daily.    . Ospemifene (OSPHENA PO) Take 60 mg by mouth.     Marland Kitchen PREMARIN vaginal cream 1  Applicatorful.     . umeclidinium-vilanterol (ANORO ELLIPTA) 62.5-25 MCG/INH AEPB Inhale 1 puff into the lungs daily.    . valsartan-hydrochlorothiazide (DIOVAN-HCT) 160-12.5 MG tablet     . fluticasone (FLONASE) 50 MCG/ACT nasal spray Place 2 sprays into the nose daily.     Marland Kitchen LORazepam (ATIVAN) 0.5 MG tablet Take 0.5 mg by mouth every 8 (eight) hours.    Marland Kitchen HYDROcodone-acetaminophen (NORCO) 7.5-325 MG tablet Take 1 tablet by mouth every 6 (six) hours as needed for moderate pain. 90 tablet 0   Facility-Administered Medications Prior to Visit  Medication Dose Route Frequency Provider Last Rate Last Dose  . betamethasone acetate-betamethasone sodium phosphate (CELESTONE) injection 3 mg  3 mg Intramuscular Once Edrick Kins, DPM        Review of Systems CNS: No sedation or confusion Cardiac: No angina or palpitations GI: No constipation or abdominal pain  Objective:  BP (!) 136/91   Pulse 83   Temp 98 F (36.7 C)   Resp 13   Ht 5' 7.5" (1.715 m)   Wt 191 lb (86.6 kg)   SpO2 95%   BMI 29.47 kg/m    BP Readings from Last 3 Encounters:  12/16/16 (!) 136/91  12/01/16 (!) 155/80  10/19/16 126/75     Wt Readings from Last 3 Encounters:  12/16/16 191 lb (86.6  kg)  12/01/16 191 lb (86.6 kg)  10/19/16 196 lb (88.9 kg)     Physical Exam Pt is alert and oriented PERRL EOMI HEART IS RRR no murmur or rub LCTA no wheezing or rhales MUSCULOSKELETAL reveals a positive straight leg raise on the right side as compared to left. She has some paraspinous muscle tenderness but no overt trigger points. Her muscle tone and bulk is good.  Labs  No results found for: HGBA1C No results found for: GLUF, MICROALBUR, LDLCALC, CREATININE  -------------------------------------------------------------------------------------------------------------------- No results found for: WBC, HGB, HCT, PLT, GLUCOSE, CHOL, TRIG, HDL, LDLDIRECT, LDLCALC, ALT, AST, NA, K, CL, CREATININE, BUN, CO2, TSH, PSA,  INR, GLUF, HGBA1C, MICROALBUR  --------------------------------------------------------------------------------------------------------------------- Dg C-arm 1-60 Min-no Report  Result Date: 12/16/2016 Fluoroscopy was utilized by the requesting physician.  No radiographic interpretation.     Assessment & Plan:   Kelsey Diaz was seen today for back pain.  Diagnoses and all orders for this visit:  DDD (degenerative disc disease), lumbar -     triamcinolone acetonide (KENALOG-40) injection 40 mg; 1 mL (40 mg total) by Other route once. -     sodium chloride flush (NS) 0.9 % injection 10 mL; 10 mLs by Other route once. -     ropivacaine (PF) 2 mg/mL (0.2%) (NAROPIN) injection 10 mL; 10 mLs by Epidural route once. -     lidocaine (PF) (XYLOCAINE) 1 % injection 5 mL; Inject 5 mLs into the skin once. -     iopamidol (ISOVUE-M) 41 % intrathecal injection 20 mL; 20 mLs by Other route once as needed for contrast. -     Lumbar Epidural Injection; Future -     Lumbar Epidural Injection  Bilateral sciatica -     triamcinolone acetonide (KENALOG-40) injection 40 mg; 1 mL (40 mg total) by Other route once. -     sodium chloride flush (NS) 0.9 % injection 10 mL; 10 mLs by Other route once. -     ropivacaine (PF) 2 mg/mL (0.2%) (NAROPIN) injection 10 mL; 10 mLs by Epidural route once. -     lidocaine (PF) (XYLOCAINE) 1 % injection 5 mL; Inject 5 mLs into the skin once. -     iopamidol (ISOVUE-M) 41 % intrathecal injection 20 mL; 20 mLs by Other route once as needed for contrast. -     Lumbar Epidural Injection; Future -     Lumbar Epidural Injection  Chronic, continuous use of opioids  Chronic pain syndrome  Chronic bilateral low back pain with bilateral sciatica  Other orders -     HYDROcodone-acetaminophen (NORCO) 7.5-325 MG tablet; Take 1 tablet by mouth every 6 (six) hours as needed for moderate  pain.        ----------------------------------------------------------------------------------------------------------------------  Problem List Items Addressed This Visit    None    Visit Diagnoses    DDD (degenerative disc disease), lumbar    -  Primary   Relevant Medications   triamcinolone acetonide (KENALOG-40) injection 40 mg (Completed)   sodium chloride flush (NS) 0.9 % injection 10 mL (Completed)   ropivacaine (PF) 2 mg/mL (0.2%) (NAROPIN) injection 10 mL (Completed)   lidocaine (PF) (XYLOCAINE) 1 % injection 5 mL (Completed)   iopamidol (ISOVUE-M) 41 % intrathecal injection 20 mL   HYDROcodone-acetaminophen (NORCO) 7.5-325 MG tablet   Other Relevant Orders   Lumbar Epidural Injection   Bilateral sciatica       Relevant Medications   triamcinolone acetonide (KENALOG-40) injection 40 mg (Completed)   sodium chloride flush (NS)  0.9 % injection 10 mL (Completed)   ropivacaine (PF) 2 mg/mL (0.2%) (NAROPIN) injection 10 mL (Completed)   lidocaine (PF) (XYLOCAINE) 1 % injection 5 mL (Completed)   iopamidol (ISOVUE-M) 41 % intrathecal injection 20 mL   Other Relevant Orders   Lumbar Epidural Injection   Chronic, continuous use of opioids       Chronic pain syndrome       Chronic bilateral low back pain with bilateral sciatica       Relevant Medications   triamcinolone acetonide (KENALOG-40) injection 40 mg (Completed)   HYDROcodone-acetaminophen (NORCO) 7.5-325 MG tablet        ----------------------------------------------------------------------------------------------------------------------  1. DDD (degenerative disc disease), lumbar We'll proceed with her second epidural steroid injection today. The risks and benefits of an reviewed and all questions answered and no guarantees were made. She is to return to clinic in 1 month for her third epidural injection. In the meantime continue with back stretching strengthening exercises as reviewed again today. -  triamcinolone acetonide (KENALOG-40) injection 40 mg; 1 mL (40 mg total) by Other route once. - sodium chloride flush (NS) 0.9 % injection 10 mL; 10 mLs by Other route once. - ropivacaine (PF) 2 mg/mL (0.2%) (NAROPIN) injection 10 mL; 10 mLs by Epidural route once. - lidocaine (PF) (XYLOCAINE) 1 % injection 5 mL; Inject 5 mLs into the skin once. - iopamidol (ISOVUE-M) 41 % intrathecal injection 20 mL; 20 mLs by Other route once as needed for contrast. - Lumbar Epidural Injection; Future - Lumbar Epidural Injection  2. Bilateral sciatica As above - triamcinolone acetonide (KENALOG-40) injection 40 mg; 1 mL (40 mg total) by Other route once. - sodium chloride flush (NS) 0.9 % injection 10 mL; 10 mLs by Other route once. - ropivacaine (PF) 2 mg/mL (0.2%) (NAROPIN) injection 10 mL; 10 mLs by Epidural route once. - lidocaine (PF) (XYLOCAINE) 1 % injection 5 mL; Inject 5 mLs into the skin once. - iopamidol (ISOVUE-M) 41 % intrathecal injection 20 mL; 20 mLs by Other route once as needed for contrast. - Lumbar Epidural Injection; Future - Lumbar Epidural Injection  3. Chronic, continuous use of opioids We'll refill her medications for November 8 and we'll reduce this to 75 tablets 4 twice a day versus 3 times a day dosing as necessary. We have reviewed the [facility] practitioner database information and it is appropriate.  4. Chronic pain syndrome   5. Chronic bilateral low back pain with bilateral sciatica     ----------------------------------------------------------------------------------------------------------------------  I am having Ms. Folkes maintain her fluticasone, Calcium Carbonate-Vitamin D, Multiple Vitamin (MULTI-VITAMIN PO), meloxicam, omeprazole, DULoxetine, albuterol, Ospemifene (OSPHENA PO), Omega-3 Fatty Acids (FISH OIL PO), valsartan-hydrochlorothiazide, PREMARIN, NONFORMULARY OR COMPOUNDED ITEM, LORazepam, dicyclomine, clobetasol cream, fluocinonide,  umeclidinium-vilanterol, and HYDROcodone-acetaminophen. We administered triamcinolone acetonide, sodium chloride flush, ropivacaine (PF) 2 mg/mL (0.2%), lidocaine (PF), and iopamidol. We will continue to administer betamethasone acetate-betamethasone sodium phosphate.   Meds ordered this encounter  Medications  . triamcinolone acetonide (KENALOG-40) injection 40 mg  . sodium chloride flush (NS) 0.9 % injection 10 mL  . ropivacaine (PF) 2 mg/mL (0.2%) (NAROPIN) injection 10 mL  . lidocaine (PF) (XYLOCAINE) 1 % injection 5 mL  . iopamidol (ISOVUE-M) 41 % intrathecal injection 20 mL  . HYDROcodone-acetaminophen (NORCO) 7.5-325 MG tablet    Sig: Take 1 tablet by mouth every 6 (six) hours as needed for moderate pain.    Dispense:  75 tablet    Refill:  0    Do not fill until  [id number]   Patient's Medications  New Prescriptions   No medications on file  Previous Medications   ALBUTEROL (PROAIR HFA) 108 (90 BASE) MCG/ACT INHALER    Inhale 2 puffs into the lungs every 6 (six) hours as needed.   CALCIUM CARBONATE-VITAMIN D (CALCIUM + D) 600-200 MG-UNIT TABS    Take by mouth daily.   CLOBETASOL CREAM (TEMOVATE) 0.05 %    Apply 1 application topically 2 (two) times daily.   DICYCLOMINE (BENTYL) 20 MG TABLET    Take 20 mg by mouth every 6 (six) hours.   DULOXETINE (CYMBALTA) 60 MG CAPSULE    Take 60 mg by mouth daily.   FLUOCINONIDE (LIDEX) 0.05 % EXTERNAL SOLUTION    Apply 1 application topically 2 (two) times daily.   FLUTICASONE (FLONASE) 50 MCG/ACT NASAL SPRAY    Place 2 sprays into the nose daily.    LORAZEPAM (ATIVAN) 0.5 MG TABLET    Take 0.5 mg by mouth every 8 (eight) hours.   MELOXICAM (MOBIC) 15 MG TABLET    Take 15 mg by mouth daily.   MULTIPLE VITAMIN (MULTI-VITAMIN PO)    Take by mouth daily.   NONFORMULARY OR COMPOUNDED ITEM    Apply 1-2 g topically 4 (four) times daily.   OMEGA-3 FATTY ACIDS (FISH OIL PO)    Take 1,000 mg by mouth.    OMEPRAZOLE (PRILOSEC) 20 MG CAPSULE    Take 20  mg by mouth 2 (two) times daily.   OSPEMIFENE (OSPHENA PO)    Take 60 mg by mouth.    PREMARIN VAGINAL CREAM    1 Applicatorful.    UMECLIDINIUM-VILANTEROL (ANORO ELLIPTA) 62.5-25 MCG/INH AEPB    Inhale 1 puff into the lungs daily.   VALSARTAN-HYDROCHLOROTHIAZIDE (DIOVAN-HCT) 160-12.5 MG TABLET      Modified Medications   Modified Medication Previous Medication   HYDROCODONE-ACETAMINOPHEN (NORCO) 7.5-325 MG TABLET HYDROcodone-acetaminophen (NORCO) 7.5-325 MG tablet      Take 1 tablet by mouth every 6 (six) hours as needed for moderate pain.    Take 1 tablet by mouth every 6 (six) hours as needed for moderate pain.  Discontinued Medications   No medications on file   ----------------------------------------------------------------------------------------------------------------------  Follow-up: Return in about 1 month (around 01/16/2017) for evaluation, procedure.   Procedure: L5-S1 epidural steroid injection fluoroscopic guidance #2 without sedation   Procedure: L5-S1 LESI with fluoroscopic guidance and moderate sedation  NOTE: The risks, benefits, and expectations of the procedure have been discussed and explained to the patient who was understanding and in agreement with suggested treatment plan. No guarantees were made.  DESCRIPTION OF PROCEDURE: Lumbar epidural steroid injection with IV Versed, EKG, blood pressure, pulse, and pulse oximetry monitoring. The procedure was performed with the patient in the prone position under fluoroscopic guidance. I injected subcutaneous lidocaine overlying the L5-S1 site after its fluoroscopic identifictation.  Using strict aseptic technique, I then advanced an 18-gauge Tuohy epidural needle in the midline using interlaminar approach via loss-of-resistance to saline technique. There was negative aspiration for heme or  CSF.  I then confirmed position with both AP and Lateral fluoroscan. 2 cc of Isovue were injected and a  total of 5 mL of  Preservative-Free normal saline mixed with 40 mg of Kenalog and 1cc Ropicaine 0.2 percent were injected incrementally via the  epidurally placed needle. The needle was removed. The patient tolerated the injection well and was convalesced and discharged to home in stable condition. Should the patient have any post procedure difficulty they have  been instructed on how to contact us for assistance.   Molli Barrows, MD

## 2016-12-16 NOTE — Progress Notes (Signed)
Nursing Pain Medication Assessment:  Safety precautions to be maintained throughout the outpatient stay will include: orient to surroundings, keep bed in low position, maintain call bell within reach at all times, provide assistance with transfer out of bed and ambulation.  Medication Inspection Compliance: Pill count conducted under aseptic conditions, in front of the patient. Neither the pills nor the bottle was removed from the patient's sight at any time. Once count was completed pills were immediately returned to the patient in their original bottle.  Medication: See above Pill/Patch Count: 45 of 90 pills remain Pill/Patch Appearance: Markings consistent with prescribed medication Bottle Appearance: Standard pharmacy container. Clearly labeled. Filled Date: 10 / 09 / 2018 Last Medication intake:  Today

## 2017-01-19 ENCOUNTER — Encounter: Payer: Self-pay | Admitting: Anesthesiology

## 2017-01-19 ENCOUNTER — Other Ambulatory Visit: Payer: Self-pay

## 2017-01-19 ENCOUNTER — Ambulatory Visit
Admission: RE | Admit: 2017-01-19 | Discharge: 2017-01-19 | Disposition: A | Discharge status: Home / Self Care | Payer: Medicare Other | Source: Ambulatory Visit | Attending: Anesthesiology | Admitting: Anesthesiology

## 2017-01-19 ENCOUNTER — Other Ambulatory Visit: Payer: Self-pay | Admitting: Anesthesiology

## 2017-01-19 ENCOUNTER — Ambulatory Visit (HOSPITAL_BASED_OUTPATIENT_CLINIC_OR_DEPARTMENT_OTHER): Payer: Medicare Other | Admitting: Anesthesiology

## 2017-01-19 VITALS — BP 124/83 | HR 88 | Temp 97.6°F | Resp 17 | Ht 67.0 in | Wt 190.0 lb

## 2017-01-19 DIAGNOSIS — M5442 Lumbago with sciatica, left side: Secondary | ICD-10-CM | POA: Diagnosis not present

## 2017-01-19 DIAGNOSIS — Z79891 Long term (current) use of opiate analgesic: Secondary | ICD-10-CM | POA: Diagnosis not present

## 2017-01-19 DIAGNOSIS — M4687 Other specified inflammatory spondylopathies, lumbosacral region: Secondary | ICD-10-CM | POA: Insufficient documentation

## 2017-01-19 DIAGNOSIS — R52 Pain, unspecified: Secondary | ICD-10-CM

## 2017-01-19 DIAGNOSIS — M5441 Lumbago with sciatica, right side: Secondary | ICD-10-CM | POA: Insufficient documentation

## 2017-01-19 DIAGNOSIS — G8929 Other chronic pain: Secondary | ICD-10-CM

## 2017-01-19 DIAGNOSIS — G894 Chronic pain syndrome: Secondary | ICD-10-CM | POA: Diagnosis present

## 2017-01-19 DIAGNOSIS — M5136 Other intervertebral disc degeneration, lumbar region: Secondary | ICD-10-CM | POA: Diagnosis not present

## 2017-01-19 DIAGNOSIS — M5432 Sciatica, left side: Secondary | ICD-10-CM

## 2017-01-19 DIAGNOSIS — Z79899 Other long term (current) drug therapy: Secondary | ICD-10-CM | POA: Diagnosis not present

## 2017-01-19 DIAGNOSIS — F1190 Opioid use, unspecified, uncomplicated: Secondary | ICD-10-CM

## 2017-01-19 DIAGNOSIS — M47817 Spondylosis without myelopathy or radiculopathy, lumbosacral region: Secondary | ICD-10-CM

## 2017-01-19 DIAGNOSIS — M5431 Sciatica, right side: Secondary | ICD-10-CM

## 2017-01-19 MED ORDER — TRIAMCINOLONE ACETONIDE 40 MG/ML IJ SUSP
40.0000 mg | Freq: Once | INTRAMUSCULAR | Status: AC
  Administered 2017-01-19: 40 mg

## 2017-01-19 MED ORDER — SODIUM CHLORIDE 0.9 % IJ SOLN
INTRAMUSCULAR | Status: AC
  Filled 2017-01-19: qty 10

## 2017-01-19 MED ORDER — HYDROCODONE-ACETAMINOPHEN 7.5-325 MG PO TABS: 1.0000 | ORAL_TABLET | Freq: Four times a day (QID) | ORAL | 0 refills | Status: DC | PRN

## 2017-01-19 MED ORDER — IOPAMIDOL (ISOVUE-M 200) INJECTION 41%
INTRAMUSCULAR | Status: AC
  Filled 2017-01-19: qty 10

## 2017-01-19 MED ORDER — ROPIVACAINE HCL 2 MG/ML IJ SOLN
INTRAMUSCULAR | Status: AC
  Filled 2017-01-19: qty 10

## 2017-01-19 MED ORDER — CYCLOBENZAPRINE HCL 10 MG PO TABS: 10.0000 mg | ORAL_TABLET | Freq: Every day | ORAL | 2 refills | Status: AC

## 2017-01-19 MED ORDER — IOPAMIDOL (ISOVUE-M 200) INJECTION 41%
20.0000 mL | Freq: Once | INTRAMUSCULAR | Status: DC | PRN
  Administered 2017-01-19: 10 mL
  Filled 2017-01-19: qty 20

## 2017-01-19 MED ORDER — ROPIVACAINE HCL 2 MG/ML IJ SOLN
10.0000 mL | Freq: Once | INTRAMUSCULAR | Status: AC
  Administered 2017-01-19: 10 mL via EPIDURAL

## 2017-01-19 MED ORDER — TRIAMCINOLONE ACETONIDE 40 MG/ML IJ SUSP
INTRAMUSCULAR | Status: AC
  Filled 2017-01-19: qty 1

## 2017-01-19 MED ORDER — LIDOCAINE HCL (PF) 1 % IJ SOLN
5.0000 mL | Freq: Once | INTRAMUSCULAR | Status: AC
  Administered 2017-01-19: 5 mL via SUBCUTANEOUS

## 2017-01-19 MED ORDER — LIDOCAINE HCL (PF) 1 % IJ SOLN
INTRAMUSCULAR | Status: AC
  Filled 2017-01-19: qty 5

## 2017-01-19 MED ORDER — SODIUM CHLORIDE 0.9% FLUSH
10.0000 mL | Freq: Once | INTRAVENOUS | Status: AC
Start: 2017-01-19 — End: 2017-01-19
  Administered 2017-01-19: 10 mL

## 2017-01-19 NOTE — Progress Notes (Signed)
Subjective:  Patient ID: Kelsey Diaz, female    DOB: [birth year]  Age: 71 y.o. MRN: [id number]  CC: Back Pain (lower)   Procedure: L5-S1 epidural steroid No. 3 under fluoroscopic guidance without sedation  HPI Kelsey Diaz presents for reevaluation.  She was last seen a month or so ago and had her second epidural at that time.  She states that she is done well with her procedures.  She has a 50-60% reduction in her low back pain and a 70% reduction in her leg pain.  She would like to proceed with a third epidural today.  The quality characteristic and distribution of her pain is otherwise unchanged.  She is taking her medications as prescribed but frequently has breakthrough on her alternative day when she is down to 2 times daily dosing.  This makes her quite miserable.  Based on her narcotic assessment sheet she continues to derive good functional lifestyle improvements with her medications.  Outpatient Medications Prior to Visit  Medication Sig Dispense Refill  . albuterol (PROAIR HFA) 108 (90 BASE) MCG/ACT inhaler Inhale 2 puffs into the lungs every 6 (six) hours as needed.    . Calcium Carbonate-Vitamin D (CALCIUM + D) 600-200 MG-UNIT TABS Take by mouth daily.    . clobetasol cream (TEMOVATE) 6.94 % Apply 1 application topically 2 (two) times daily.    Marland Kitchen dicyclomine (BENTYL) 20 MG tablet Take 20 mg by mouth every 6 (six) hours.    . DULoxetine (CYMBALTA) 60 MG capsule Take 60 mg by mouth daily.    . fluticasone (FLONASE) 50 MCG/ACT nasal spray Place 2 sprays into the nose daily.     . meloxicam (MOBIC) 15 MG tablet Take 15 mg by mouth daily.    . Multiple Vitamin (MULTI-VITAMIN PO) Take by mouth daily.    . NONFORMULARY OR COMPOUNDED ITEM Apply 1-2 g topically 4 (four) times daily. 120 each 2  . Omega-3 Fatty Acids (FISH OIL PO) Take 1,000 mg by mouth.     Marland Kitchen omeprazole (PRILOSEC) 20 MG capsule Take 20 mg by mouth 2 (two) times daily.    . Ospemifene (OSPHENA PO) Take 60 mg by  mouth.     Marland Kitchen PREMARIN vaginal cream 1 Applicatorful.     . valsartan-hydrochlorothiazide (DIOVAN-HCT) 160-12.5 MG tablet     . HYDROcodone-acetaminophen (NORCO) 7.5-325 MG tablet Take 1 tablet by mouth every 6 (six) hours as needed for moderate pain. 75 tablet 0  . HYDROcodone-acetaminophen (NORCO) 7.5-325 MG tablet Take 1 tablet by mouth every 6 (six) hours as needed for moderate pain.    . fluocinonide (LIDEX) 0.05 % external solution Apply 1 application topically 2 (two) times daily.    Marland Kitchen LORazepam (ATIVAN) 0.5 MG tablet Take 0.5 mg by mouth every 8 (eight) hours.    Marland Kitchen umeclidinium-vilanterol (ANORO ELLIPTA) 62.5-25 MCG/INH AEPB Inhale 1 puff into the lungs daily.     Facility-Administered Medications Prior to Visit  Medication Dose Route Frequency Provider Last Rate Last Dose  . betamethasone acetate-betamethasone sodium phosphate (CELESTONE) injection 3 mg  3 mg Intramuscular Once Edrick Kins, DPM        Review of Systems CNS: No sedation or confusion Cardiac: No angina or palpitations GI: No abdominal pain or constipation  Objective:  BP (!) 148/90   Pulse 88   Temp 97.6 F (36.4 C) (Oral)   Resp 18   Ht 5\' 7"  (1.702 m)   Wt 190 lb (86.2 kg)   SpO2 96%  BMI 29.76 kg/m    BP Readings from Last 3 Encounters:  01/19/17 (!) 148/90  12/16/16 (!) 136/91  12/01/16 (!) 155/80     Wt Readings from Last 3 Encounters:  01/19/17 190 lb (86.2 kg)  12/16/16 191 lb (86.6 kg)  12/01/16 191 lb (86.6 kg)     Physical Exam Pt is alert and oriented PERRL EOMI HEART IS RRR no murmur or rub LCTA no wheezing or rhales MUSCULOSKELETAL reveals some persistent paraspinous muscle tenderness but less than previous.  She has less sciatica-like symptoms on straight leg raise.  She seems to be making improvement with good muscle tone and bulk  Labs  No results found for: HGBA1C No results found for: GLUF, MICROALBUR, LDLCALC,  CREATININE  -------------------------------------------------------------------------------------------------------------------- No results found for: WBC, HGB, HCT, PLT, GLUCOSE, CHOL, TRIG, HDL, LDLDIRECT, LDLCALC, ALT, AST, NA, K, CL, CREATININE, BUN, CO2, TSH, PSA, INR, GLUF, HGBA1C, MICROALBUR  --------------------------------------------------------------------------------------------------------------------- Dg C-arm 1-60 Min-no Report  Result Date: 01/19/2017 Fluoroscopy was utilized by the requesting physician.  No radiographic interpretation.     Assessment & Plan:   Kelsey Diaz was seen today for back pain.  Diagnoses and all orders for this visit:  Chronic pain syndrome -     ToxASSURE Select 13 (MW), Urine  DDD (degenerative disc disease), lumbar  Bilateral sciatica  Chronic, continuous use of opioids  Chronic bilateral low back pain with bilateral sciatica  Facet arthritis of lumbosacral region ([place])  Other orders -     Discontinue: HYDROcodone-acetaminophen (NORCO) 7.5-325 MG tablet; Take 1 tablet by mouth every 6 (six) hours as needed for moderate pain. -     HYDROcodone-acetaminophen (NORCO) 7.5-325 MG tablet; Take 1 tablet by mouth every 6 (six) hours as needed for moderate pain. -     triamcinolone acetonide (KENALOG-40) injection 40 mg -     ropivacaine (PF) 2 mg/mL (0.2%) (NAROPIN) injection 10 mL -     sodium chloride flush (NS) 0.9 % injection 10 mL -     lidocaine (PF) (XYLOCAINE) 1 % injection 5 mL -     iopamidol (ISOVUE-M) 41 % intrathecal injection 20 mL -     cyclobenzaprine (FLEXERIL) 10 MG tablet; Take 1 tablet (10 mg total) by mouth at bedtime.        ----------------------------------------------------------------------------------------------------------------------  Problem List Items Addressed This Visit    None    Visit Diagnoses    Chronic pain syndrome    -  Primary   Relevant Orders   ToxASSURE Select 13 (MW), Urine   DDD  (degenerative disc disease), lumbar       Relevant Medications   HYDROcodone-acetaminophen (NORCO) 7.5-325 MG tablet   triamcinolone acetonide (KENALOG-40) injection 40 mg (Completed)   cyclobenzaprine (FLEXERIL) 10 MG tablet   Bilateral sciatica       Relevant Medications   cyclobenzaprine (FLEXERIL) 10 MG tablet   Chronic, continuous use of opioids       Chronic bilateral low back pain with bilateral sciatica       Relevant Medications   HYDROcodone-acetaminophen (NORCO) 7.5-325 MG tablet   triamcinolone acetonide (KENALOG-40) injection 40 mg (Completed)   cyclobenzaprine (FLEXERIL) 10 MG tablet   Facet arthritis of lumbosacral region ([facility])       Relevant Medications   HYDROcodone-acetaminophen (NORCO) 7.5-325 MG tablet   triamcinolone acetonide (KENALOG-40) injection 40 mg (Completed)   cyclobenzaprine (FLEXERIL) 10 MG tablet        ----------------------------------------------------------------------------------------------------------------------  1. Chronic pain syndrome Will allow her to increase  to 3 times daily dosing with refills given for December 8 in January 7 of next year.  We reviewed the [facility] practitioner database information and it is appropriate - ToxASSURE Select 13 (MW), Urine  2. DDD (degenerative disc disease), lumbar Proceed with a third epidural today.  We have gone over the risks and benefits of the procedure with her in full detail and she is to return to clinic in 2 months.  3. Bilateral sciatica As above  4. Chronic, continuous use of opioids As above  5. Chronic bilateral low back pain with bilateral sciatica Continue with back stretching strengthening exercises  6. Facet arthritis of lumbosacral region [facility])     ----------------------------------------------------------------------------------------------------------------------  I am having Kelsey Diaz. Kelsey Diaz start on cyclobenzaprine. I am also having her maintain her  fluticasone, Calcium Carbonate-Vitamin D, Multiple Vitamin (MULTI-VITAMIN PO), meloxicam, omeprazole, DULoxetine, albuterol, Ospemifene (OSPHENA PO), Omega-3 Fatty Acids (FISH OIL PO), valsartan-hydrochlorothiazide, PREMARIN, NONFORMULARY OR COMPOUNDED ITEM, LORazepam, dicyclomine, clobetasol cream, fluocinonide, umeclidinium-vilanterol, and HYDROcodone-acetaminophen. We administered triamcinolone acetonide, ropivacaine (PF) 2 mg/mL (0.2%), sodium chloride flush, lidocaine (PF), and iopamidol. We will continue to administer betamethasone acetate-betamethasone sodium phosphate.   Meds ordered this encounter  Medications  . DISCONTD: HYDROcodone-acetaminophen (NORCO) 7.5-325 MG tablet    Sig: Take 1 tablet by mouth every 6 (six) hours as needed for moderate pain.    Dispense:  90 tablet    Refill:  0    Do not fill until [id number]  . HYDROcodone-acetaminophen (NORCO) 7.5-325 MG tablet    Sig: Take 1 tablet by mouth every 6 (six) hours as needed for moderate pain.    Dispense:  90 tablet    Refill:  0    Do not fill until [id number]  . triamcinolone acetonide (KENALOG-40) injection 40 mg  . ropivacaine (PF) 2 mg/mL (0.2%) (NAROPIN) injection 10 mL  . sodium chloride flush (NS) 0.9 % injection 10 mL  . lidocaine (PF) (XYLOCAINE) 1 % injection 5 mL  . iopamidol (ISOVUE-M) 41 % intrathecal injection 20 mL  . cyclobenzaprine (FLEXERIL) 10 MG tablet    Sig: Take 1 tablet (10 mg total) by mouth at bedtime.    Dispense:  30 tablet    Refill:  2     Medication List        Accurate as of 01/19/17  3:45 PM. Always use your most recent med list.          ANORO ELLIPTA 62.5-25 MCG/INH Aepb Generic drug:  umeclidinium-vilanterol   Calcium Carbonate-Vitamin D 600-200 MG-UNIT Tabs   clobetasol cream 0.05 % Commonly known as:  TEMOVATE   cyclobenzaprine 10 MG tablet Commonly known as:  FLEXERIL Take 1 tablet (10 mg total) by mouth at bedtime.   dicyclomine 20 MG tablet Commonly known as:   BENTYL   DULoxetine 60 MG capsule Commonly known as:  CYMBALTA   FISH OIL PO   fluocinonide 0.05 % external solution Commonly known as:  LIDEX   fluticasone 50 MCG/ACT nasal spray Commonly known as:  FLONASE   HYDROcodone-acetaminophen 7.5-325 MG tablet Commonly known as:  NORCO Take 1 tablet by mouth every 6 (six) hours as needed for moderate pain.   LORazepam 0.5 MG tablet Commonly known as:  ATIVAN   meloxicam 15 MG tablet Commonly known as:  MOBIC   MULTI-VITAMIN PO   NONFORMULARY OR COMPOUNDED ITEM Apply 1-2 g topically 4 (four) times daily.   omeprazole 20 MG capsule Commonly known as:  PRILOSEC   OSPHENA PO  PREMARIN vaginal cream Generic drug:  conjugated estrogens   PROAIR HFA 108 (90 Base) MCG/ACT inhaler Generic drug:  albuterol   valsartan-hydrochlorothiazide 160-12.5 MG tablet Commonly known as:  DIOVAN-HCT       Where to Get Your Medications    These medications were sent to [id number] - [place], [address]  [place], [place] [address]   Phone:  [phone]   cyclobenzaprine 10 MG tablet   You can get these medications from any pharmacy   Bring a paper prescription for each of these medications  HYDROcodone-acetaminophen 7.5-325 MG tablet    ---------------------------------------------------------------------------------------------------------------------- Suture: L5-S1 epidural steroid No. 3 under fluoroscopic guidance without patient   Procedure: L5-S1 LESI with fluoroscopic guidance and moderate sedation  NOTE: The risks, benefits, and expectations of the procedure have been discussed and explained to the patient who was understanding and in agreement with suggested treatment plan. No guarantees were made.  DESCRIPTION OF PROCEDURE: Lumbar epidural steroid injection with IV Versed, EKG, blood pressure, pulse, and pulse oximetry monitoring. The procedure was performed with the patient in the prone  position under fluoroscopic guidance. I injected subcutaneous lidocaine overlying the S1 site after its fluoroscopic identifictation.  Using strict aseptic technique, I then advanced an 18-gauge Tuohy epidural needle in the midline using interlaminar approach via loss-of-resistance to saline technique. There was negative aspiration for heme or  CSF.  I then confirmed position with both AP and Lateral fluoroscan.  2 cc of Isovue were injected and a  total of 5 mL of Preservative-Free normal saline mixed with 40 mg of Kenalog and 1cc Ropicaine 0.2 percent were injected incrementally via the  epidurally placed needle. The needle was removed. The patient tolerated the injection well and was convalesced and discharged to home in stable condition. Should the patient have any post procedure difficulty they have been instructed on how to contact us for assistance.   Follow-up: Return for evaluation, med refill.    Molli Barrows, MD

## 2017-01-19 NOTE — Patient Instructions (Signed)

## 2017-01-19 NOTE — Progress Notes (Signed)
Nursing Pain Medication Assessment:  Safety precautions to be maintained throughout the outpatient stay will include: orient to surroundings, keep bed in low position, maintain call bell within reach at all times, provide assistance with transfer out of bed and ambulation.  Medication Inspection Compliance: Pill count conducted under aseptic conditions, in front of the patient. Neither the pills nor the bottle was removed from the patient's sight at any time. Once count was completed pills were immediately returned to the patient in their original bottle.  Medication: Hydrocodone/APAP Pill/Patch Count: 29 of 75 pills remain Pill/Patch Appearance: Markings consistent with prescribed medication Bottle Appearance: Standard pharmacy container. Clearly labeled. Filled Date: 11/08 / 2018 Last Medication intake:  Today

## 2017-01-20 ENCOUNTER — Telehealth: Payer: Self-pay | Admitting: *Deleted

## 2017-01-20 NOTE — Telephone Encounter (Signed)
Denies complications post procedure. 

## 2017-01-24 LAB — TOXASSURE SELECT 13 (MW), URINE

## 2017-01-29 ENCOUNTER — Other Ambulatory Visit: Payer: Self-pay | Admitting: Obstetrics and Gynecology

## 2017-01-29 DIAGNOSIS — N6310 Unspecified lump in the right breast, unspecified quadrant: Secondary | ICD-10-CM

## 2017-02-03 ENCOUNTER — Other Ambulatory Visit: Payer: Medicare Other

## 2017-02-05 ENCOUNTER — Ambulatory Visit
Admission: RE | Admit: 2017-02-05 | Discharge: 2017-02-05 | Disposition: A | Discharge status: Home / Self Care | Payer: Medicare Other | Source: Ambulatory Visit | Attending: Obstetrics and Gynecology | Admitting: Obstetrics and Gynecology

## 2017-02-05 DIAGNOSIS — N6310 Unspecified lump in the right breast, unspecified quadrant: Secondary | ICD-10-CM

## 2017-03-18 ENCOUNTER — Other Ambulatory Visit: Payer: Self-pay | Admitting: Family Medicine

## 2017-03-18 ENCOUNTER — Ambulatory Visit
Admission: RE | Admit: 2017-03-18 | Discharge: 2017-03-18 | Disposition: A | Discharge status: Home / Self Care | Payer: Medicare Other | Source: Ambulatory Visit | Attending: Family Medicine | Admitting: Family Medicine

## 2017-03-18 DIAGNOSIS — M25551 Pain in right hip: Secondary | ICD-10-CM

## 2017-03-18 DIAGNOSIS — M25552 Pain in left hip: Secondary | ICD-10-CM

## 2017-03-18 DIAGNOSIS — R0609 Other forms of dyspnea: Principal | ICD-10-CM

## 2017-03-18 DIAGNOSIS — R0600 Dyspnea, unspecified: Secondary | ICD-10-CM

## 2017-03-24 ENCOUNTER — Other Ambulatory Visit: Payer: Self-pay

## 2017-03-24 ENCOUNTER — Ambulatory Visit: Payer: Medicare Other | Attending: Anesthesiology | Admitting: Anesthesiology

## 2017-03-24 ENCOUNTER — Encounter: Payer: Self-pay | Admitting: Anesthesiology

## 2017-03-24 VITALS — BP 128/75 | HR 83 | Temp 98.4°F | Resp 16 | Ht 66.0 in | Wt 196.0 lb

## 2017-03-24 DIAGNOSIS — M5431 Sciatica, right side: Secondary | ICD-10-CM | POA: Diagnosis not present

## 2017-03-24 DIAGNOSIS — M25552 Pain in left hip: Secondary | ICD-10-CM | POA: Diagnosis not present

## 2017-03-24 DIAGNOSIS — M5136 Other intervertebral disc degeneration, lumbar region: Secondary | ICD-10-CM | POA: Insufficient documentation

## 2017-03-24 DIAGNOSIS — M4697 Unspecified inflammatory spondylopathy, lumbosacral region: Secondary | ICD-10-CM | POA: Diagnosis not present

## 2017-03-24 DIAGNOSIS — F1190 Opioid use, unspecified, uncomplicated: Secondary | ICD-10-CM

## 2017-03-24 DIAGNOSIS — M25551 Pain in right hip: Secondary | ICD-10-CM | POA: Insufficient documentation

## 2017-03-24 DIAGNOSIS — M5442 Lumbago with sciatica, left side: Secondary | ICD-10-CM

## 2017-03-24 DIAGNOSIS — M5432 Sciatica, left side: Secondary | ICD-10-CM | POA: Diagnosis not present

## 2017-03-24 DIAGNOSIS — M5441 Lumbago with sciatica, right side: Secondary | ICD-10-CM

## 2017-03-24 DIAGNOSIS — G8929 Other chronic pain: Secondary | ICD-10-CM | POA: Diagnosis not present

## 2017-03-24 DIAGNOSIS — Z79899 Other long term (current) drug therapy: Secondary | ICD-10-CM | POA: Insufficient documentation

## 2017-03-24 DIAGNOSIS — Z79891 Long term (current) use of opiate analgesic: Secondary | ICD-10-CM | POA: Diagnosis not present

## 2017-03-24 DIAGNOSIS — M47817 Spondylosis without myelopathy or radiculopathy, lumbosacral region: Secondary | ICD-10-CM

## 2017-03-24 DIAGNOSIS — G894 Chronic pain syndrome: Secondary | ICD-10-CM | POA: Insufficient documentation

## 2017-03-24 DIAGNOSIS — M4687 Other specified inflammatory spondylopathies, lumbosacral region: Secondary | ICD-10-CM | POA: Insufficient documentation

## 2017-03-24 MED ORDER — METHOCARBAMOL 500 MG PO TABS: 500.0000 mg | ORAL_TABLET | Freq: Four times a day (QID) | ORAL | 2 refills | Status: DC

## 2017-03-24 MED ORDER — HYDROCODONE-ACETAMINOPHEN 7.5-325 MG PO TABS: 1.0000 | ORAL_TABLET | Freq: Three times a day (TID) | ORAL | 0 refills | Status: DC

## 2017-03-24 NOTE — Progress Notes (Signed)
Nursing Pain Medication Assessment:  Safety precautions to be maintained throughout the outpatient stay will include: orient to surroundings, keep bed in low position, maintain call bell within reach at all times, provide assistance with transfer out of bed and ambulation.  Medication Inspection Compliance: Pill count conducted under aseptic conditions, in front of the patient. Neither the pills nor the bottle was removed from the patient's sight at any time. Once count was completed pills were immediately returned to the patient in their original bottle.  Medication: Hydrocodone/APAP Pill/Patch Count: 25 of 90 pills remain Pill/Patch Appearance: Markings consistent with prescribed medication Bottle Appearance: Standard pharmacy container. Clearly labeled. Filled Date1/ 07 / 2019 Last Medication intake:  Today

## 2017-03-25 ENCOUNTER — Ambulatory Visit: Payer: Medicare Other | Admitting: Pulmonary Disease

## 2017-03-25 ENCOUNTER — Encounter: Payer: Self-pay | Admitting: Pulmonary Disease

## 2017-03-25 ENCOUNTER — Encounter: Payer: Self-pay | Admitting: Anesthesiology

## 2017-03-25 ENCOUNTER — Other Ambulatory Visit (INDEPENDENT_AMBULATORY_CARE_PROVIDER_SITE_OTHER): Payer: Medicare Other

## 2017-03-25 VITALS — BP 148/88 | HR 90 | Ht 66.0 in | Wt 197.0 lb

## 2017-03-25 DIAGNOSIS — J449 Chronic obstructive pulmonary disease, unspecified: Secondary | ICD-10-CM

## 2017-03-25 LAB — CBC WITH DIFFERENTIAL/PLATELET
BASOS PCT: 1 % (ref 0.0–3.0)
Basophils Absolute: 0.1 10*3/uL (ref 0.0–0.1)
Eosinophils Absolute: 0.3 10*3/uL (ref 0.0–0.7)
Eosinophils Relative: 3.8 % (ref 0.0–5.0)
HEMATOCRIT: 42.9 % (ref 36.0–46.0)
Hemoglobin: 14.5 g/dL (ref 12.0–15.0)
Lymphocytes Relative: 28 % (ref 12.0–46.0)
Lymphs Abs: 1.9 10*3/uL (ref 0.7–4.0)
MCHC: 33.7 g/dL (ref 30.0–36.0)
MCV: 88.4 fl (ref 78.0–100.0)
MONOS PCT: 10.1 % (ref 3.0–12.0)
Monocytes Absolute: 0.7 10*3/uL (ref 0.1–1.0)
NEUTROS ABS: 3.9 10*3/uL (ref 1.4–7.7)
Neutrophils Relative %: 57.1 % (ref 43.0–77.0)
Platelets: 229 10*3/uL (ref 150.0–400.0)
RBC: 4.85 Mil/uL (ref 3.87–5.11)
RDW: 12.6 % (ref 11.5–15.5)
WBC: 6.9 10*3/uL (ref 4.0–10.5)

## 2017-03-25 LAB — NITRIC OXIDE: Nitric Oxide: 37

## 2017-03-25 MED ORDER — FLUTICASONE FUROATE-VILANTEROL 100-25 MCG/INH IN AEPB: 1.0000 | INHALATION_SPRAY | Freq: Every day | RESPIRATORY_TRACT | 5 refills | Status: AC

## 2017-03-25 MED ORDER — FLUTICASONE FUROATE-VILANTEROL 100-25 MCG/INH IN AEPB: 1.0000 | INHALATION_SPRAY | Freq: Every day | RESPIRATORY_TRACT | 0 refills | Status: AC

## 2017-03-25 NOTE — Progress Notes (Signed)
Kelsey Diaz    [id number]    Feb 17, 1946  Primary Care Physician:White, Caren Griffins, MD  Referring Physician: Harlan Stains, MD [place], [place] [address]  Chief complaint: Consult for COPD  HPI: 72 year old with history of hypertension, COPD, fibromyalgia, GERD, hypothyroidism, sleep apnea,.  She has previously seen Dr. Halford Chessman at the pulmonary clinic in 2014.  Referred back for management of COPD, asthma She reports worsening dyspnea on exertion or the past 6 months associated with chest tightness, wheezing.  She is just on albuterol inhaler and has been intolerant ofFollow-up Advair, Pulmicort and Spiriva in the past.  She denies any nighttime awakening.  No sputum production, fevers, chills, hemoptysis.   History noted for significant GERD, hiatal hernia, esophageal strictures.  She follows with Dr. Teena Irani, GI heartburn. Reports worsening heartburn, indigestion over the past few months.  She has history of sleep apnea and was previously on CPAP.  She stopped using it many years ago as she was intolerant of the mask and does not want to try again.  Pets: No pets Occupation: Retired Secondary school teacher at [employer] Exposures: No mold, hot tubs.  No other significant exposure. Smoking history: 80-pack-year smoking history.  Quit in 2001 Travel History: Not significant.  Lived in [place] all her life.  Outpatient Encounter Medications as of 03/25/2017  Medication Sig  . albuterol (PROAIR HFA) 108 (90 BASE) MCG/ACT inhaler Inhale 2 puffs into the lungs every 6 (six) hours as needed.  . Calcium Carbonate-Vitamin D (CALCIUM + D) 600-200 MG-UNIT TABS Take by mouth daily.  . clobetasol cream (TEMOVATE) 4.08 % Apply 1 application topically 2 (two) times daily.  Marland Kitchen dicyclomine (BENTYL) 20 MG tablet Take 20 mg by mouth every 6 (six) hours.  . DULoxetine (CYMBALTA) 60 MG capsule Take 60 mg by mouth daily.  . fluocinonide (LIDEX) 0.05 % external  solution Apply 1 application topically 2 (two) times daily.  . fluticasone (FLONASE) 50 MCG/ACT nasal spray Place 2 sprays into the nose daily.   Marland Kitchen HYDROcodone-acetaminophen (NORCO) 7.5-325 MG tablet Take 1 tablet by mouth 3 (three) times daily.  Marland Kitchen LORazepam (ATIVAN) 0.5 MG tablet Take 0.5 mg by mouth every 8 (eight) hours.  . meloxicam (MOBIC) 15 MG tablet Take 15 mg by mouth daily.  . methocarbamol (ROBAXIN) 500 MG tablet Take 1 tablet (500 mg total) by mouth 4 (four) times daily.  . Multiple Vitamin (MULTI-VITAMIN PO) Take by mouth daily.  . NONFORMULARY OR COMPOUNDED ITEM Apply 1-2 g topically 4 (four) times daily.  . Omega-3 Fatty Acids (FISH OIL PO) Take 1,000 mg by mouth.   Marland Kitchen omeprazole (PRILOSEC) 20 MG capsule Take 20 mg by mouth 2 (two) times daily.  . Ospemifene (OSPHENA PO) Take 60 mg by mouth.   Marland Kitchen PREMARIN vaginal cream 1 Applicatorful.   . valsartan-hydrochlorothiazide (DIOVAN-HCT) 160-12.5 MG tablet   . [DISCONTINUED] cyclobenzaprine (FLEXERIL) 10 MG tablet Take 10 mg by mouth 3 (three) times daily as needed for muscle spasms.  . [DISCONTINUED] umeclidinium-vilanterol (ANORO ELLIPTA) 62.5-25 MCG/INH AEPB Inhale 1 puff into the lungs daily.   Facility-Administered Encounter Medications as of 03/25/2017  Medication  . betamethasone acetate-betamethasone sodium phosphate (CELESTONE) injection 3 mg    Allergies as of 03/25/2017 - Review Complete 03/25/2017  Allergen Reaction Noted  . Advair diskus [fluticasone-salmeterol]  09/07/2011  . Ambien [zolpidem tartrate]  09/07/2011  . Amlodipine besylate  09/07/2011  . Bextra [valdecoxib]  09/07/2011  .  Captopril  09/07/2011  . Latex Itching 01/19/2017  . Lisinopril  09/07/2011  . Morphine  10/17/2009  . Oxycodone hcl  10/17/2009  . Pulmicort [budesonide]  09/07/2011  . Savella [milnacipran hcl]  09/07/2011  . Spiriva [tiotropium bromide monohydrate]  09/07/2011    Past Medical History:  Diagnosis Date  . Allergic rhinitis     . Anxiety   . Asthma   . Chest pain   . Colitis   . COPD (chronic obstructive pulmonary disease) ([place])   . Fibromyalgia   . GERD (gastroesophageal reflux disease)    With esophageal strictures  . Hypertension   . Hyperthyroidism   . OSA (obstructive sleep apnea) 02/09/2012  . Osteoarthritis     Past Surgical History:  Procedure Laterality Date  . BREAST BIOPSY     x2  . CHOLECYSTECTOMY     2005  . KNEE SURGERY Right 2013   two torn ligaments repaired.   Marland Kitchen NASAL SINUS SURGERY    . VAGINAL HYSTERECTOMY      Family History  Problem Relation Age of Onset  . Hypertension Mother   . Diabetes Mother   . Allergies Mother   . Heart attack Father   . Cancer Father        kidney  . Emphysema Father   . Heart disease Father   . Heart attack Brother     Social History   Socioeconomic History  . Marital status: Married    Spouse name: Not on file  . Number of children: 3  . Years of education: Not on file  . Highest education level: Not on file  Social Needs  . Financial resource strain: Not on file  . Food insecurity - worry: Not on file  . Food insecurity - inability: Not on file  . Transportation needs - medical: Not on file  . Transportation needs - non-medical: Not on file  Occupational History  . Occupation: retired    Fish farm manager: [employer]  Tobacco Use  . Smoking status: Former Smoker    Packs/day: 2.00    Years: 40.00    Pack years: 80.00    Types: Cigarettes    Last attempt to quit: 02/24/1999    Years since quitting: 18.0  . Smokeless tobacco: Never Used  Substance and Sexual Activity  . Alcohol use: Yes    Comment: occassioal  . Drug use: No  . Sexual activity: Not on file  Other Topics Concern  . Not on file  Social History Narrative  . Not on file   Review of systems: Review of Systems  Constitutional: Negative for fever and chills.  HENT: Negative.   Eyes: Negative for blurred vision.  Respiratory: as per HPI  Cardiovascular: Negative for  chest pain and palpitations.  Gastrointestinal: Negative for vomiting, diarrhea, blood per rectum. Genitourinary: Negative for dysuria, urgency, frequency and hematuria.  Musculoskeletal: Negative for myalgias, back pain and joint pain.  Skin: Negative for itching and rash.  Neurological: Negative for dizziness, tremors, focal weakness, seizures and loss of consciousness.  Endo/Heme/Allergies: Negative for environmental allergies.  Psychiatric/Behavioral: Negative for depression, suicidal ideas and hallucinations.  All other systems reviewed and are negative.  Physical Exam: Blood pressure (!) 148/88, pulse 90, height 5\' 6"  (1.676 m), weight 197 lb (89.4 kg), SpO2 95 %. Gen:      No acute distress HEENT:  EOMI, sclera anicteric Neck:     No masses; no thyromegaly Lungs:    Clear to auscultation bilaterally; normal respiratory effort  CV:         Regular rate and rhythm; no murmurs Abd:      + bowel sounds; soft, non-tender; no palpable masses, no distension Ext:    No edema; adequate peripheral perfusion Skin:      Warm and dry; no rash Neuro: alert and oriented x 3 Psych: normal mood and affect  Data Reviewed: FENO 03/25/17-37  PFTs 10/09/11 FVC 3.51 [104%], FEV1 2.20 [89%], F/F 63, TLC 92%, DLCO 52% Mild obstructive airway disease.  Moderate decrease in diffusion capacity.  No bronchodilator response.  CBC 02/28/17 from primary care WBC 6.6, eos 2 .9%, absolute eosinophil count 191  Chest x-ray 03/18/17-mild apical thickening.  No acute cardiopulmonary process.  I have reviewed the images personally.  Assessment:  Follow-up for dyspnea COPD, moderate persistent asthma We will reevaluate by ordering pulmonary function test, blood allergy profile, alpha-1 antitrypsin levels and phenotype She will need to be on a controller medication given persistent symptoms and elevated FENO.  She had been intolerant of many inhalers in the pas due to thrush .  Will attempt Breo 100.  I have  advised her to rinse her mouth carefully after each use.  Has worsening heartburn which is likely contributing to poor asthma control.  I have asked her to follow-up with Dr. Amedeo Plenty, GI and make an appointment for further management.  Obstructive sleep apnea Noncompliant with CPAP.  She is not interested in retrying CPAP therapy.  Plan/Recommendations: - PFTs, CBC differential, blood allergy profile, alpha-1 antitrypsin levels and phenotype - Start Breo, continue albuterol as needed.  Marshell Garfinkel MD [facility] Pager [phone] 03/25/2017, 2:33 PM  CC: Harlan Stains, MD

## 2017-03-25 NOTE — Patient Instructions (Addendum)
We will schedule you for pulmonary function Check CBC with differential, blood allergy profile, alpha-1 antitrypsin levels and phenotype We will start you on Breo100.  Continue albuterol as needed Please make an appointment with Dr. Amedeo Plenty for management of your worsening acid reflux. Follow-up in 1 month

## 2017-03-25 NOTE — Progress Notes (Signed)
Subjective:  Patient ID: Kelsey Diaz, female    DOB: 1945-07-27  Age: 72 y.o. MRN: [id number]  CC: Hip Pain (left)   Procedure: None  HPI Kelsey Diaz presents for reevaluation.  She was last seen 2 months ago and has been doing reasonably well.  Her medications seem to keep her pain under control.  She is primarily parenting left lower back pain and left hip pain.  The quality characteristic and distribution of this pain is been stable in nature.  Presently she is taking Vicodin 7.5 mg tablets generally 3 times a day at most.  She is also using occasional Flexeril without difficulty.  In review of her narcotic assessment sheet she continues to derive good functional improvement with her medications and and improve lifestyle with these with minimal side effects.  No change in lower extremity strength or function bowel bladder function are noted at this time.  Outpatient Medications Prior to Visit  Medication Sig Dispense Refill  . albuterol (PROAIR HFA) 108 (90 BASE) MCG/ACT inhaler Inhale 2 puffs into the lungs every 6 (six) hours as needed.    . Calcium Carbonate-Vitamin D (CALCIUM + D) 600-200 MG-UNIT TABS Take by mouth daily.    . clobetasol cream (TEMOVATE) 7.32 % Apply 1 application topically 2 (two) times daily.    . cyclobenzaprine (FLEXERIL) 10 MG tablet Take 10 mg by mouth 3 (three) times daily as needed for muscle spasms.    Marland Kitchen dicyclomine (BENTYL) 20 MG tablet Take 20 mg by mouth every 6 (six) hours.    . DULoxetine (CYMBALTA) 60 MG capsule Take 60 mg by mouth daily.    . fluocinonide (LIDEX) 0.05 % external solution Apply 1 application topically 2 (two) times daily.    . fluticasone (FLONASE) 50 MCG/ACT nasal spray Place 2 sprays into the nose daily.     Marland Kitchen LORazepam (ATIVAN) 0.5 MG tablet Take 0.5 mg by mouth every 8 (eight) hours.    . meloxicam (MOBIC) 15 MG tablet Take 15 mg by mouth daily.    . Multiple Vitamin (MULTI-VITAMIN PO) Take by mouth daily.    . Omega-3  Fatty Acids (FISH OIL PO) Take 1,000 mg by mouth.     Marland Kitchen omeprazole (PRILOSEC) 20 MG capsule Take 20 mg by mouth 2 (two) times daily.    . Ospemifene (OSPHENA PO) Take 60 mg by mouth.     Marland Kitchen PREMARIN vaginal cream 1 Applicatorful.     . umeclidinium-vilanterol (ANORO ELLIPTA) 62.5-25 MCG/INH AEPB Inhale 1 puff into the lungs daily.    . valsartan-hydrochlorothiazide (DIOVAN-HCT) 160-12.5 MG tablet     . HYDROcodone-acetaminophen (NORCO) 7.5-325 MG tablet Take 1 tablet by mouth every 6 (six) hours as needed for moderate pain. 90 tablet 0  . NONFORMULARY OR COMPOUNDED ITEM Apply 1-2 g topically 4 (four) times daily. (Patient not taking: Reported on 03/24/2017) 120 each 2   Facility-Administered Medications Prior to Visit  Medication Dose Route Frequency Provider Last Rate Last Dose  . betamethasone acetate-betamethasone sodium phosphate (CELESTONE) injection 3 mg  3 mg Intramuscular Once Edrick Kins, DPM        Review of Systems CNS: No confusion or sedation Cardiac: No angina or palpitations GI: No abdominal pain or constipation Constitutional: No nausea vomiting fevers or chills  Objective:  BP 128/75   Pulse 83   Temp 98.4 F (36.9 C)   Resp 16   Ht 5\' 6"  (1.676 m)   Wt 196 lb (88.9 kg)  SpO2 98%   BMI 31.64 kg/m    BP Readings from Last 3 Encounters:  03/24/17 128/75  01/19/17 124/83  12/16/16 (!) 136/91     Wt Readings from Last 3 Encounters:  03/24/17 196 lb (88.9 kg)  01/19/17 190 lb (86.2 kg)  12/16/16 191 lb (86.6 kg)     Physical Exam Pt is alert and oriented PERRL EOMI HEART IS RRR no murmur or rub LCTA no wheezing or rales MUSCULOSKELETAL reveals some paraspinous muscle tenderness but no overt trigger points.  Her lower extremity strength and function is at baseline with baseline muscle tone and bulk.  Labs  No results found for: HGBA1C No results found for: GLUF, MICROALBUR, LDLCALC,  CREATININE  -------------------------------------------------------------------------------------------------------------------- No results found for: WBC, HGB, HCT, PLT, GLUCOSE, CHOL, TRIG, HDL, LDLDIRECT, LDLCALC, ALT, AST, NA, K, CL, CREATININE, BUN, CO2, TSH, PSA, INR, GLUF, HGBA1C, MICROALBUR  --------------------------------------------------------------------------------------------------------------------- Dg Chest 2 View  Result Date: 03/18/2017 CLINICAL DATA:  Short of breath with exertion EXAM: CHEST  2 VIEW COMPARISON:  Chest x-ray of 09/07/2011 FINDINGS: No active infiltrate or effusion is seen. Mild apical pleural thickening is present. Mediastinal and hilar contours are unremarkable. The heart is within upper limits of normal. No acute bony abnormality is seen. IMPRESSION: No active cardiopulmonary disease. Electronically Signed   By: Ivar Drape M.D.   On: 03/18/2017 17:14   Dg Hips Bilat With Pelvis 3-4 Views  Result Date: 03/18/2017 CLINICAL DATA:  Bilateral hip pain for 1 year left-greater-than-right, no trauma EXAM: DG HIP (WITH OR WITHOUT PELVIS) 3-4V BILAT COMPARISON:  None. FINDINGS: For age there is very mild degenerative joint disease of both hips with only slight loss of joint space and minimal superior acetabular spurring. No acute fracture is seen. Both pelvic rami appear intact. The SI joints appear corticated. IMPRESSION: Only mild degenerative joint disease of the hips for age. No acute abnormality. Electronically Signed   By: Ivar Drape M.D.   On: 03/18/2017 17:15     Assessment & Plan:   Kelsey Diaz was seen today for hip pain.  Diagnoses and all orders for this visit:  Chronic pain syndrome  DDD (degenerative disc disease), lumbar  Bilateral sciatica  Chronic, continuous use of opioids  Chronic bilateral low back pain with bilateral sciatica  Facet arthritis of lumbosacral region ([place])  Other orders -     Discontinue: HYDROcodone-acetaminophen (NORCO)  7.5-325 MG tablet; Take 1 tablet by mouth 3 (three) times daily. -     HYDROcodone-acetaminophen (NORCO) 7.5-325 MG tablet; Take 1 tablet by mouth 3 (three) times daily. -     methocarbamol (ROBAXIN) 500 MG tablet; Take 1 tablet (500 mg total) by mouth 4 (four) times daily.        ----------------------------------------------------------------------------------------------------------------------  Problem List Items Addressed This Visit    None    Visit Diagnoses    Chronic pain syndrome    -  Primary   DDD (degenerative disc disease), lumbar       Relevant Medications   cyclobenzaprine (FLEXERIL) 10 MG tablet   HYDROcodone-acetaminophen (NORCO) 7.5-325 MG tablet   methocarbamol (ROBAXIN) 500 MG tablet   Bilateral sciatica       Relevant Medications   cyclobenzaprine (FLEXERIL) 10 MG tablet   methocarbamol (ROBAXIN) 500 MG tablet   Chronic, continuous use of opioids       Chronic bilateral low back pain with bilateral sciatica       Relevant Medications   cyclobenzaprine (FLEXERIL) 10 MG tablet  HYDROcodone-acetaminophen (NORCO) 7.5-325 MG tablet   methocarbamol (ROBAXIN) 500 MG tablet   Facet arthritis of lumbosacral region ([facility])       Relevant Medications   cyclobenzaprine (FLEXERIL) 10 MG tablet   HYDROcodone-acetaminophen (NORCO) 7.5-325 MG tablet   methocarbamol (ROBAXIN) 500 MG tablet        ----------------------------------------------------------------------------------------------------------------------  1. Chronic pain syndrome She seems to be doing well with her current regimen.  Unfortunately she is failed conservative therapy and continues to require opioid management for her chronic low back pain and hip pain.  She has been on Flexeril in the past but pharmacy has recommended a switch over to methocarbamol secondary to potential for serotonin syndrome.  A prescription was given for methocarbamol 500 mg 4 times daily today.  She is to return to clinic in 2  months we have also reviewed her [facility] practitioner database information and prescriptions were given for her Vicodin for February 6 and March 8  2. DDD (degenerative disc disease), lumbar   3. Bilateral sciatica   4. Chronic, continuous use of opioids As above  5. Chronic bilateral low back pain with bilateral sciatica   6. Facet arthritis of lumbosacral region [facility])     ----------------------------------------------------------------------------------------------------------------------  I am having Kelsey Diaz. Advani start on methocarbamol. I am also having her maintain her fluticasone, Calcium Carbonate-Vitamin D, Multiple Vitamin (MULTI-VITAMIN PO), meloxicam, omeprazole, DULoxetine, albuterol, Ospemifene (OSPHENA PO), Omega-3 Fatty Acids (FISH OIL PO), valsartan-hydrochlorothiazide, PREMARIN, NONFORMULARY OR COMPOUNDED ITEM, LORazepam, dicyclomine, clobetasol cream, fluocinonide, umeclidinium-vilanterol, cyclobenzaprine, and HYDROcodone-acetaminophen. We will continue to administer betamethasone acetate-betamethasone sodium phosphate.   Meds ordered this encounter  Medications  . DISCONTD: HYDROcodone-acetaminophen (NORCO) 7.5-325 MG tablet    Sig: Take 1 tablet by mouth 3 (three) times daily.    Dispense:  90 tablet    Refill:  0    Do not fill until [id number]  . HYDROcodone-acetaminophen (NORCO) 7.5-325 MG tablet    Sig: Take 1 tablet by mouth 3 (three) times daily.    Dispense:  90 tablet    Refill:  0    Do not fill until [id number]  . methocarbamol (ROBAXIN) 500 MG tablet    Sig: Take 1 tablet (500 mg total) by mouth 4 (four) times daily.    Dispense:  120 tablet    Refill:  2   Patient's Medications  New Prescriptions   METHOCARBAMOL (ROBAXIN) 500 MG TABLET    Take 1 tablet (500 mg total) by mouth 4 (four) times daily.  Previous Medications   ALBUTEROL (PROAIR HFA) 108 (90 BASE) MCG/ACT INHALER    Inhale 2 puffs into the lungs every 6 (six) hours as  needed.   CALCIUM CARBONATE-VITAMIN D (CALCIUM + D) 600-200 MG-UNIT TABS    Take by mouth daily.   CLOBETASOL CREAM (TEMOVATE) 0.05 %    Apply 1 application topically 2 (two) times daily.   CYCLOBENZAPRINE (FLEXERIL) 10 MG TABLET    Take 10 mg by mouth 3 (three) times daily as needed for muscle spasms.   DICYCLOMINE (BENTYL) 20 MG TABLET    Take 20 mg by mouth every 6 (six) hours.   DULOXETINE (CYMBALTA) 60 MG CAPSULE    Take 60 mg by mouth daily.   FLUOCINONIDE (LIDEX) 0.05 % EXTERNAL SOLUTION    Apply 1 application topically 2 (two) times daily.   FLUTICASONE (FLONASE) 50 MCG/ACT NASAL SPRAY    Place 2 sprays into the nose daily.    LORAZEPAM (ATIVAN) 0.5 MG TABLET  Take 0.5 mg by mouth every 8 (eight) hours.   MELOXICAM (MOBIC) 15 MG TABLET    Take 15 mg by mouth daily.   MULTIPLE VITAMIN (MULTI-VITAMIN PO)    Take by mouth daily.   NONFORMULARY OR COMPOUNDED ITEM    Apply 1-2 g topically 4 (four) times daily.   OMEGA-3 FATTY ACIDS (FISH OIL PO)    Take 1,000 mg by mouth.    OMEPRAZOLE (PRILOSEC) 20 MG CAPSULE    Take 20 mg by mouth 2 (two) times daily.   OSPEMIFENE (OSPHENA PO)    Take 60 mg by mouth.    PREMARIN VAGINAL CREAM    1 Applicatorful.    UMECLIDINIUM-VILANTEROL (ANORO ELLIPTA) 62.5-25 MCG/INH AEPB    Inhale 1 puff into the lungs daily.   VALSARTAN-HYDROCHLOROTHIAZIDE (DIOVAN-HCT) 160-12.5 MG TABLET      Modified Medications   Modified Medication Previous Medication   HYDROCODONE-ACETAMINOPHEN (NORCO) 7.5-325 MG TABLET HYDROcodone-acetaminophen (NORCO) 7.5-325 MG tablet      Take 1 tablet by mouth 3 (three) times daily.    Take 1 tablet by mouth every 6 (six) hours as needed for moderate pain.  Discontinued Medications   No medications on file   ----------------------------------------------------------------------------------------------------------------------  Follow-up: Return in about 2 months (around 05/22/2017) for evaluation, med refill.    Molli Barrows, MD

## 2017-03-30 LAB — RESPIRATORY ALLERGY PROFILE REGION II ~~LOC~~
Allergen, Cedar tree, t12: <0.1 kU/L
Allergen, Cottonwood, t14: <0.1 kU/L
Allergen, Mouse Urine Protein, e78: <0.1 kU/L
Allergen, Mulberry, t76: <0.1 kU/L
Allergen, P. notatum, m1: <0.1 kU/L
CLADOSPORIUM HERBARUM (M2) IGE: <0.1 kU/L
CLASS: 0
CLASS: 0
CLASS: 0
CLASS: 0
CLASS: 0
CLASS: 0
CLASS: 0
CLASS: 0
CLASS: 0
Cat Dander: <0.1 kU/L
Class: 0
Class: 0
Class: 0
Class: 0
Class: 0
Class: 0
Class: 0
Class: 0
Class: 0
Class: 0
Class: 0
Class: 0
Class: 0
Class: 0
Class: 0
D. farinae: <0.1 kU/L
Dog Dander: <0.1 kU/L
IGE (IMMUNOGLOBULIN E), SERUM: 52 kU/L (ref <=114)
Rough Pigweed  IgE: <0.1 kU/L
Sheep Sorrel IgE: <0.1 kU/L

## 2017-03-30 LAB — ALPHA-1 ANTITRYPSIN PHENOTYPE: A-1 Antitrypsin, Ser: 193 mg/dL (ref 83–199)

## 2017-03-30 LAB — INTERPRETATION:

## 2017-04-25 ENCOUNTER — Encounter: Payer: Self-pay | Admitting: Cardiology

## 2017-04-25 NOTE — Progress Notes (Signed)
Cardiology Office Note   Date:  04/26/2017   ID:  Kelsey, Diaz 03-02-1945, MRN [id number]  PCP:  Kelsey Stains, MD  Cardiologist:   No primary care provider on file. Referring:  Kelsey Stains, MD  Chief Complaint  Patient presents with  . Shortness of Breath  . Chest Pain      History of Present Illness: Kelsey Diaz is a 72 y.o. female who Is referred by Kelsey Stains, MD for evaluation of dyspnea and chest pain.  She reports a distant cardiac catheterization although I do not have these records.  She did have a stress perfusion study that was negative in 2011 and I was able to review these images.  She has a long history of a hiatal hernia and an EGD is planned for the next few weeks.  She presents because she is been having chest discomfort that is getting worse.  She is had this for years but it seems to be increasing in intensity.  It is a squeezing discomfort up into her jaw starting in her mid chest.  For the last 12 months that has been worse.  It feels potentially like her reflux but she does not have the acid sensation anymore.  It seemed to get a little bit better when she started Prilosec.  It happens at rest.  She avoids foods that will exacerbated and it still happens.  It might last for about 5 minutes.  She might take an Alka-Seltzer with some relief.  It 7- 9/10 intensity.  She gets some nausea with it.  She might get a little diaphoretic.  She is limited in activities but cannot necessarily bring it on with that.  She does get short of breath with mild exertion but has been worse over the last six months.     Past Medical History:  Diagnosis Date  . Allergic rhinitis   . Anxiety   . Asthma   . Colitis   . COPD (chronic obstructive pulmonary disease) ([place])   . Fibromyalgia   . GERD (gastroesophageal reflux disease)    With esophageal strictures  . Hypertension   . Hyperthyroidism   . OSA (obstructive sleep apnea) 02/09/2012   No mask.  Did not  tolerate  . Osteoarthritis     Past Surgical History:  Procedure Laterality Date  . BREAST BIOPSY     x2  . CHOLECYSTECTOMY     2005  . KNEE SURGERY Right 2013   two torn ligaments repaired.   Marland Kitchen NASAL SINUS SURGERY    . VAGINAL HYSTERECTOMY       Current Outpatient Medications  Medication Sig Dispense Refill  . albuterol (PROAIR HFA) 108 (90 BASE) MCG/ACT inhaler Inhale 2 puffs into the lungs every 6 (six) hours as needed.    . Calcium Carbonate-Vitamin D (CALCIUM + D) 600-200 MG-UNIT TABS Take by mouth daily.    . clobetasol cream (TEMOVATE) 5.28 % Apply 1 application topically 2 (two) times daily.    Marland Kitchen dicyclomine (BENTYL) 20 MG tablet Take 20 mg by mouth every 6 (six) hours as needed.     . DULoxetine (CYMBALTA) 60 MG capsule Take 60 mg by mouth daily.    . fluocinonide (LIDEX) 0.05 % external solution Apply 1 application topically 2 (two) times daily.    Marland Kitchen HYDROcodone-acetaminophen (NORCO) 7.5-325 MG tablet Take 1 tablet by mouth 3 (three) times daily. 90 tablet 0  . LORazepam (ATIVAN) 0.5 MG tablet Take 0.5 mg by mouth  every 8 (eight) hours.    . meloxicam (MOBIC) 15 MG tablet Take 15 mg by mouth daily.    . methocarbamol (ROBAXIN) 500 MG tablet Take 1 tablet (500 mg total) by mouth 4 (four) times daily. 120 tablet 2  . Multiple Vitamin (MULTI-VITAMIN PO) Take by mouth daily.    . Omega-3 Fatty Acids (FISH OIL PO) Take 1,000 mg by mouth.     Marland Kitchen omeprazole (PRILOSEC) 40 MG capsule Take 1 capsule by mouth daily.  1  . Ospemifene (OSPHENA PO) Take 60 mg by mouth.     Marland Kitchen PREMARIN vaginal cream 1 Applicatorful.     . valsartan-hydrochlorothiazide (DIOVAN-HCT) 160-12.5 MG tablet      Current Facility-Administered Medications  Medication Dose Route Frequency Provider Last Rate Last Dose  . betamethasone acetate-betamethasone sodium phosphate (CELESTONE) injection 3 mg  3 mg Intramuscular Once Edrick Kins, DPM        Allergies:   Advair diskus [fluticasone-salmeterol]; Ambien  [zolpidem tartrate]; Amlodipine besylate; Bextra [valdecoxib]; Captopril; Latex; Lisinopril; Morphine; Oxycodone hcl; Pulmicort [budesonide]; Savella [milnacipran hcl]; and Spiriva [tiotropium bromide monohydrate]    Social History:  The patient  reports that she quit smoking about 18 years ago. Her smoking use included cigarettes. She has a 80.00 pack-year smoking history. she has never used smokeless tobacco. She reports that she drinks alcohol. She reports that she does not use drugs.   Family History:  The patient's family history includes Allergies in her mother; Cancer in her father; Diabetes in her mother; Emphysema in her father; Heart attack (age of onset: 84) in her father; Heart attack (age of onset: 51) in her brother; Heart disease in her father; Hypertension in her mother.    ROS:  Please see the history of present illness.   Otherwise, review of systems are positive for back pain and leg aching at night. .   All other systems are reviewed and negative.    PHYSICAL EXAM: VS:  BP 120/72 (BP Location: Left Arm, Patient Position: Sitting, Cuff Size: Normal)   Pulse 78   Ht 5\' 6"  (1.676 m)   Wt 200 lb 12.8 oz (91.1 kg)   BMI 32.41 kg/m  , BMI Body mass index is 32.41 kg/m. GENERAL:  Well appearing HEENT:  Pupils equal round and reactive, fundi not visualized, oral mucosa unremarkable NECK:  No jugular venous distention, waveform within normal limits, carotid upstroke brisk and symmetric, no bruits, no thyromegaly LYMPHATICS:  No cervical, inguinal adenopathy LUNGS:  Clear to auscultation bilaterally BACK:  No CVA tenderness CHEST:  Unremarkable HEART:  PMI not displaced or sustained,S1 and S2 within normal limits, no S3, no S4, no clicks, no rubs, no murmurs ABD:  Flat, positive bowel sounds normal in frequency in pitch, no bruits, no rebound, no guarding, no midline pulsatile mass, no hepatomegaly, no splenomegaly EXT:  2 plus pulses throughout, no edema, no cyanosis no  clubbing SKIN:  No rashes no nodules NEURO:  Cranial nerves II through XII grossly intact, motor grossly intact throughout PSYCH:  Cognitively intact, oriented to person place and time    EKG:  EKG is ordered today. The ekg ordered today demonstrates sinus rhythm, rate 78, axis within normal limits, intervals WNL, no acute ST T wave changes.    Recent Labs: 03/25/2017: Hemoglobin 14.5; Platelets 229.0    Lipid Panel No results found for: CHOL, TRIG, HDL, CHOLHDL, VLDL, LDLCALC, LDLDIRECT    Wt Readings from Last 3 Encounters:  04/26/17 200 lb 12.8 oz (  91.1 kg)  03/25/17 197 lb (89.4 kg)  03/24/17 196 lb (88.9 kg)      Other studies Reviewed: Additional studies/ records that were reviewed today include: Pulmonary notes, Lexiscan Myoview images, primary care notes. . Review of the above records demonstrates:  Please see elsewhere in the note.     ASSESSMENT AND PLAN:  DYSPNEA:  I will start with a BNP test.  She is followed by a pulmonologist and is being treated for COPD.   CHEST PAIN: This has atypical greater than typical features.  However, she has a dramatic family history.  I would like for her to do a stress test but she not be able to walk on a treadmill.  Therefore, she will have a [employer].  HTN:  The blood pressure is at target. No change in medications is indicated. We will continue with therapeutic lifestyle changes (TLC).'   Current medicines are reviewed at length with the patient today.  The patient does not have concerns regarding medicines.  The following changes have been made:  no change  Labs/ tests ordered today include:   Orders Placed This Encounter  Procedures  . B Nat Peptide  . MYOCARDIAL PERFUSION IMAGING     Disposition:   FU with me as needed and if she has continued unexplained chest pain after this work up and EGD.     Signed, Minus Breeding, MD  04/26/2017 11:54 AM    [facility]

## 2017-04-26 ENCOUNTER — Ambulatory Visit: Payer: Medicare Other | Admitting: Cardiology

## 2017-04-26 ENCOUNTER — Encounter: Payer: Self-pay | Admitting: Cardiology

## 2017-04-26 VITALS — BP 120/72 | HR 78 | Ht 66.0 in | Wt 200.8 lb

## 2017-04-26 DIAGNOSIS — R0602 Shortness of breath: Secondary | ICD-10-CM | POA: Diagnosis not present

## 2017-04-26 DIAGNOSIS — R0789 Other chest pain: Secondary | ICD-10-CM | POA: Diagnosis not present

## 2017-04-26 NOTE — Patient Instructions (Addendum)
Medication Instructions:  Continue current medications  If you need a refill on your cardiac medications before your next appointment, please call your pharmacy.  Labwork: BNP Today HERE IN OUR OFFICE AT [facility]  Take the provided lab slips for you to take with you to the lab for you blood draw.   You will NOT need to fast   Testing/Procedures: Your physician has requested that you have a lexiscan myoview. For further information please visit [email or web address]. Please follow instruction sheet, as given.   Follow-Up: Your physician wants you to follow-up in: As Needed.    Thank you for choosing [facility] at [facility]!!

## 2017-04-27 LAB — BRAIN NATRIURETIC PEPTIDE: BNP: 20.8 pg/mL (ref 0.0–100.0)

## 2017-04-29 NOTE — Addendum Note (Signed)
Addended by: Vennie Homans on: 04/29/2017 09:08 AM   Modules accepted: Orders

## 2017-05-04 ENCOUNTER — Ambulatory Visit: Payer: Medicare Other | Admitting: Pulmonary Disease

## 2017-05-04 ENCOUNTER — Ambulatory Visit (INDEPENDENT_AMBULATORY_CARE_PROVIDER_SITE_OTHER): Payer: Medicare Other | Admitting: Pulmonary Disease

## 2017-05-04 ENCOUNTER — Encounter: Payer: Self-pay | Admitting: Pulmonary Disease

## 2017-05-04 VITALS — BP 136/80 | HR 79 | Ht 66.0 in | Wt 201.0 lb

## 2017-05-04 DIAGNOSIS — J449 Chronic obstructive pulmonary disease, unspecified: Secondary | ICD-10-CM

## 2017-05-04 LAB — PULMONARY FUNCTION TEST
DL/VA % PRED: 53 %
DL/VA: 2.69 ml/min/mmHg/L
DLCO COR % PRED: 51 %
DLCO cor: 14.01 ml/min/mmHg
DLCO unc % pred: 53 %
DLCO unc: 14.46 ml/min/mmHg
FEF 25-75 POST: 1.73 L/s
FEF 25-75 Pre: 1.71 L/sec
FEF2575-%CHANGE-POST: 1 %
FEF2575-%PRED-POST: 90 %
FEF2575-%PRED-PRE: 89 %
FEV1-%CHANGE-POST: 1 %
FEV1-%Pred-Post: 97 %
FEV1-%Pred-Pre: 96 %
FEV1-Post: 2.33 L
FEV1-Pre: 2.3 L
FEV1FVC-%CHANGE-POST: 0 %
FEV1FVC-%PRED-PRE: 97 %
FEV6-%Change-Post: 2 %
FEV6-%PRED-PRE: 103 %
FEV6-%Pred-Post: 105 %
FEV6-POST: 3.19 L
FEV6-Pre: 3.12 L
FEV6FVC-%Change-Post: 0 %
FEV6FVC-%PRED-POST: 103 %
FEV6FVC-%Pred-Pre: 104 %
FVC-%Change-Post: 1 %
FVC-%PRED-POST: 101 %
FVC-%PRED-PRE: 99 %
FVC-POST: 3.2 L
FVC-PRE: 3.14 L
PRE FEV6/FVC RATIO: 100 %
Post FEV1/FVC ratio: 73 %
Post FEV6/FVC ratio: 100 %
Pre FEV1/FVC ratio: 73 %
RV % pred: 113 %
RV: 2.63 L
TLC % PRED: 108 %
TLC: 5.83 L

## 2017-05-04 LAB — POCT EXHALED NITRIC OXIDE: FeNO level (ppb): 34

## 2017-05-04 MED ORDER — FLUTICASONE-UMECLIDIN-VILANT 100-62.5-25 MCG/INH IN AEPB: 1.0000 | INHALATION_SPRAY | Freq: Every day | RESPIRATORY_TRACT | 6 refills | Status: DC

## 2017-05-04 MED ORDER — FLUTICASONE-UMECLIDIN-VILANT 100-62.5-25 MCG/INH IN AEPB: 1.0000 | INHALATION_SPRAY | Freq: Every day | RESPIRATORY_TRACT | 0 refills | Status: DC

## 2017-05-04 NOTE — Patient Instructions (Addendum)
We will stop the [facility] and start trelegy Please have your GI doctor send records to our office after he finishes the EGD Follow-up in 3 months.

## 2017-05-04 NOTE — Progress Notes (Addendum)
Kelsey Diaz    [id number]    1945-03-12  Primary Care Physician:White, Caren Griffins, MD  Referring Physician: Harlan Stains, MD [place], [place] [address]  Chief complaint: Follow-up for COPD, asthma, sleep apnea  HPI: 72 year old with history of hypertension, COPD, fibromyalgia, GERD, hypothyroidism, sleep apnea,.  She has previously seen Dr. Halford Chessman at the pulmonary clinic in 2014.  Referred back for management of COPD, asthma She reports worsening dyspnea on exertion or the past 6 months associated with chest tightness, wheezing.  She is just on albuterol inhaler and has been intolerant ofFollow-up Advair, Pulmicort and Spiriva in the past.  She denies any nighttime awakening.  No sputum production, fevers, chills, hemoptysis.   History noted for significant GERD, hiatal hernia, esophageal strictures.  She follows with Dr. Teena Irani, GI heartburn. Reports worsening heartburn, indigestion over the past few months.  She has history of sleep apnea and was previously on CPAP.  She stopped using it many years ago as she was intolerant of the mask and does not want to try again.  Pets: No pets Occupation: Retired Secondary school teacher at [employer] Exposures: No mold, hot tubs.  No other significant exposure. Smoking history: 80-pack-year smoking history.  Quit in 2001 Travel History: Not significant.  Lived in [place] all her life.  Interim history: She feels breathing is improved slightly but.  However she is still needing to use the albuterol 4 times daily.  Has dyspnea with cough, wheezing.  Denies any sputum production, fevers, chills.  Outpatient Encounter Medications as of 05/04/2017  Medication Sig  . albuterol (PROAIR HFA) 108 (90 BASE) MCG/ACT inhaler Inhale 2 puffs into the lungs every 6 (six) hours as needed.  . Calcium Carbonate-Vitamin D (CALCIUM + D) 600-200 MG-UNIT TABS Take by mouth daily.  . clobetasol cream (TEMOVATE) 1.91 % Apply  1 application topically 2 (two) times daily.  Marland Kitchen dicyclomine (BENTYL) 20 MG tablet Take 20 mg by mouth every 6 (six) hours as needed.   . DULoxetine (CYMBALTA) 60 MG capsule Take 60 mg by mouth daily.  . fluocinonide (LIDEX) 0.05 % external solution Apply 1 application topically 2 (two) times daily.  Marland Kitchen HYDROcodone-acetaminophen (NORCO) 7.5-325 MG tablet Take 1 tablet by mouth 3 (three) times daily.  Marland Kitchen LORazepam (ATIVAN) 0.5 MG tablet Take 0.5 mg by mouth every 8 (eight) hours.  . meloxicam (MOBIC) 15 MG tablet Take 15 mg by mouth daily.  . methocarbamol (ROBAXIN) 500 MG tablet Take 1 tablet (500 mg total) by mouth 4 (four) times daily.  . Multiple Vitamin (MULTI-VITAMIN PO) Take by mouth daily.  . Omega-3 Fatty Acids (FISH OIL PO) Take 1,000 mg by mouth.   Marland Kitchen omeprazole (PRILOSEC) 40 MG capsule Take 1 capsule by mouth daily.  . Ospemifene (OSPHENA PO) Take 60 mg by mouth.   Marland Kitchen PREMARIN vaginal cream 1 Applicatorful.   . valsartan-hydrochlorothiazide (DIOVAN-HCT) 160-12.5 MG tablet    Facility-Administered Encounter Medications as of 05/04/2017  Medication  . betamethasone acetate-betamethasone sodium phosphate (CELESTONE) injection 3 mg    Allergies as of 05/04/2017 - Review Complete 05/04/2017  Allergen Reaction Noted  . Advair diskus [fluticasone-salmeterol]  09/07/2011  . Ambien [zolpidem tartrate]  09/07/2011  . Amlodipine besylate  09/07/2011  . Bextra [valdecoxib]  09/07/2011  . Captopril  09/07/2011  . Latex Itching 01/19/2017  . Lisinopril  09/07/2011  . Morphine  10/17/2009  . Oxycodone hcl  10/17/2009  . Pulmicort [  budesonide]  09/07/2011  . Savella [milnacipran hcl]  09/07/2011  . Spiriva [tiotropium bromide monohydrate]  09/07/2011    Past Medical History:  Diagnosis Date  . Allergic rhinitis   . Anxiety   . Asthma   . Colitis   . COPD (chronic obstructive pulmonary disease) ([place])   . Fibromyalgia   . GERD (gastroesophageal reflux disease)    With esophageal  strictures  . Hypertension   . Hyperthyroidism   . OSA (obstructive sleep apnea) 02/09/2012   No mask.  Did not tolerate  . Osteoarthritis     Past Surgical History:  Procedure Laterality Date  . BREAST BIOPSY     x2  . CHOLECYSTECTOMY     2005  . KNEE SURGERY Right 2013   two torn ligaments repaired.   Marland Kitchen NASAL SINUS SURGERY    . VAGINAL HYSTERECTOMY      Family History  Problem Relation Age of Onset  . Hypertension Mother   . Diabetes Mother   . Allergies Mother   . Heart attack Father 62  . Cancer Father        kidney  . Emphysema Father   . Heart disease Father        CHF age 75  . Heart attack Brother 53       Died of MI age 55    Social History   Socioeconomic History  . Marital status: Married    Spouse name: Not on file  . Number of children: 3  . Years of education: Not on file  . Highest education level: Not on file  Social Needs  . Financial resource strain: Not on file  . Food insecurity - worry: Not on file  . Food insecurity - inability: Not on file  . Transportation needs - medical: Not on file  . Transportation needs - non-medical: Not on file  Occupational History  . Occupation: Retired    Fish farm manager: [employer]  Tobacco Use  . Smoking status: Former Smoker    Packs/day: 2.00    Years: 40.00    Pack years: 80.00    Types: Cigarettes    Last attempt to quit: 02/24/1999    Years since quitting: 18.2  . Smokeless tobacco: Never Used  Substance and Sexual Activity  . Alcohol use: Yes    Comment: occassioal  . Drug use: No  . Sexual activity: Not on file  Other Topics Concern  . Not on file  Social History Narrative   Lives with husband.  5 children together. (She has 3).     Review of systems: Review of Systems  Constitutional: Negative for fever and chills.  HENT: Negative.   Eyes: Negative for blurred vision.  Respiratory: as per HPI  Cardiovascular: Negative for chest pain and palpitations.  Gastrointestinal: Negative for vomiting,  diarrhea, blood per rectum. Genitourinary: Negative for dysuria, urgency, frequency and hematuria.  Musculoskeletal: Negative for myalgias, back pain and joint pain.  Skin: Negative for itching and rash.  Neurological: Negative for dizziness, tremors, focal weakness, seizures and loss of consciousness.  Endo/Heme/Allergies: Negative for environmental allergies.  Psychiatric/Behavioral: Negative for depression, suicidal ideas and hallucinations.  All other systems reviewed and are negative.  Physical Exam: Blood pressure 136/80, pulse 79, height 5\' 6"  (1.676 m), weight 201 lb (91.2 kg), SpO2 96 %. Gen:      No acute distress HEENT:  EOMI, sclera anicteric Neck:     No masses; no thyromegaly Lungs:    Clear to auscultation bilaterally;  normal respiratory effort CV:         Regular rate and rhythm; no murmurs Abd:      + bowel sounds; soft, non-tender; no palpable masses, no distension Ext:    No edema; adequate peripheral perfusion Skin:      Warm and dry; no rash Neuro: alert and oriented x 3 Psych: normal mood and affect  Data Reviewed: PFTs  10/09/11 FVC 3.51 [104%], FEV1 2.20 [89%], F/F 63, TLC 92%, DLCO 52% Mild obstructive airway disease.  Moderate decrease in diffusion capacity.  No bronchodilator response.  05/04/17 FVC 3.20 [101%], FEV1 2.23 [97%], F/F 73, TLC 108%, DLCO 53% Minimal obstructive airway disease, moderate decrease in diffusion capacity.  No bronchodilator response.  Labs CBC 03/25/17-WBC 6.9, eos 3.8%, absolute eosinophil count 300 Allergy profile 03/25/17-IgE 52, RAST panel negative Alpha-1 antitrypsin 03/25/17-193, PIMM  FENO 03/25/17-37 FENO 05/04/17-34  Imaging Chest x-ray 03/18/17-mild apical thickening.  No acute cardiopulmonary process.  CT abdomen pelvis 12/01/14- visualized lung bases are clear. I have reviewed the images personally.  Assessment:  COPD, moderate persistent asthma  PFTs reviewed. Although she does not have obstruction by F/F criteria  at this curvature to the loop suggestive of minimal obstructive airway disease Appears to have responded to [facility] but is still using the albuterol up to 3-4 times.  I will switch inhalers to trelegy Reduction in diffusion capacity noted.  There is no evidence of interstitial lung disease   GERD Has worsening heartburn which is likely contributing to poor asthma control.  She has followed up with a [facility] and has EGD scheduled for next week.  Obstructive sleep apnea Noncompliant with CPAP.  She is not interested in retrying CPAP therapy.  Plan/Recommendations: Location manager. Start trelegy  Marshell Garfinkel MD [facility] 05/04/2017, 12:24 PM  CC: Harlan Stains, MD

## 2017-05-04 NOTE — Progress Notes (Signed)
PFT completed today 05/04/17  

## 2017-05-19 ENCOUNTER — Telehealth (HOSPITAL_COMMUNITY): Payer: Self-pay

## 2017-05-19 ENCOUNTER — Other Ambulatory Visit: Payer: Self-pay

## 2017-05-19 ENCOUNTER — Encounter: Payer: Self-pay | Admitting: Anesthesiology

## 2017-05-19 ENCOUNTER — Ambulatory Visit: Payer: Medicare Other | Attending: Anesthesiology | Admitting: Anesthesiology

## 2017-05-19 VITALS — BP 119/78 | HR 101 | Temp 98.3°F | Resp 18 | Ht 66.0 in | Wt 200.0 lb

## 2017-05-19 DIAGNOSIS — M5432 Sciatica, left side: Secondary | ICD-10-CM

## 2017-05-19 DIAGNOSIS — M5136 Other intervertebral disc degeneration, lumbar region: Secondary | ICD-10-CM | POA: Diagnosis not present

## 2017-05-19 DIAGNOSIS — M79604 Pain in right leg: Secondary | ICD-10-CM | POA: Diagnosis not present

## 2017-05-19 DIAGNOSIS — M25552 Pain in left hip: Secondary | ICD-10-CM | POA: Diagnosis present

## 2017-05-19 DIAGNOSIS — M5442 Lumbago with sciatica, left side: Secondary | ICD-10-CM | POA: Diagnosis not present

## 2017-05-19 DIAGNOSIS — F1190 Opioid use, unspecified, uncomplicated: Secondary | ICD-10-CM

## 2017-05-19 DIAGNOSIS — M4697 Unspecified inflammatory spondylopathy, lumbosacral region: Secondary | ICD-10-CM

## 2017-05-19 DIAGNOSIS — G894 Chronic pain syndrome: Secondary | ICD-10-CM

## 2017-05-19 DIAGNOSIS — M5431 Sciatica, right side: Secondary | ICD-10-CM | POA: Diagnosis not present

## 2017-05-19 DIAGNOSIS — M47817 Spondylosis without myelopathy or radiculopathy, lumbosacral region: Secondary | ICD-10-CM

## 2017-05-19 DIAGNOSIS — M5441 Lumbago with sciatica, right side: Secondary | ICD-10-CM | POA: Diagnosis not present

## 2017-05-19 DIAGNOSIS — M51369 Other intervertebral disc degeneration, lumbar region without mention of lumbar back pain or lower extremity pain: Secondary | ICD-10-CM

## 2017-05-19 DIAGNOSIS — M545 Low back pain: Secondary | ICD-10-CM | POA: Diagnosis present

## 2017-05-19 MED ORDER — DEXAMETHASONE SODIUM PHOSPHATE 10 MG/ML IJ SOLN
INTRAMUSCULAR | Status: AC
  Filled 2017-05-19: qty 1

## 2017-05-19 MED ORDER — ROPIVACAINE HCL 2 MG/ML IJ SOLN
INTRAMUSCULAR | Status: AC
  Filled 2017-05-19: qty 10

## 2017-05-19 MED ORDER — HYDROCODONE-ACETAMINOPHEN 7.5-325 MG PO TABS: 1.0000 | ORAL_TABLET | Freq: Three times a day (TID) | ORAL | 0 refills | Status: DC

## 2017-05-19 MED ORDER — DEXAMETHASONE SODIUM PHOSPHATE 10 MG/ML IJ SOLN
10.0000 mg | Freq: Once | INTRAMUSCULAR | Status: AC
  Administered 2017-05-19: 10 mg

## 2017-05-19 MED ORDER — ROPIVACAINE HCL 2 MG/ML IJ SOLN
10.0000 mL | Freq: Once | INTRAMUSCULAR | Status: AC
  Administered 2017-05-19: 10 mL via EPIDURAL

## 2017-05-19 NOTE — Progress Notes (Signed)
Subjective:  Patient ID: Kelsey Diaz, female    DOB: 03/10/1945  Age: 72 y.o. MRN: [id number]  CC: Hip Pain (left) and Back Pain (low)   Procedure: Left trigger point injection overlying the left posterior superior iliac crest  HPI Kelsey Diaz presents for reevaluation.  She was last seen 2 months ago and has been doing reasonably well in regards to her low back pain and leg pain.  She has had some new problems regarding her left hip.  She reports that she has had issues with chronic left hip pain and recently experience what she believes to be a dislocation of the left hip however it popped into place spontaneously.  She is still having some pain in the left low back from this.  No change in lower extremity strength or function or bowel bladder function is noted.  She is been taking her medications as prescribed and these continue to give her good pain relief based on her narcotic assessment sheet.  Otherwise she is in her usual state of health at this time.  Outpatient Medications Prior to Visit  Medication Sig Dispense Refill  . albuterol (PROAIR HFA) 108 (90 BASE) MCG/ACT inhaler Inhale 2 puffs into the lungs every 6 (six) hours as needed.    . Calcium Carbonate-Vitamin D (CALCIUM + D) 600-200 MG-UNIT TABS Take by mouth daily.    . clobetasol cream (TEMOVATE) 2.33 % Apply 1 application topically 2 (two) times daily.    Marland Kitchen dicyclomine (BENTYL) 20 MG tablet Take 20 mg by mouth every 6 (six) hours as needed.     . DULoxetine (CYMBALTA) 60 MG capsule Take 60 mg by mouth daily.    . fluocinonide (LIDEX) 0.05 % external solution Apply 1 application topically 2 (two) times daily.    . Fluticasone-Umeclidin-Vilant (TRELEGY ELLIPTA) 100-62.5-25 MCG/INH AEPB Inhale 1 puff into the lungs daily. 1 each 0  . Fluticasone-Umeclidin-Vilant (TRELEGY ELLIPTA) 100-62.5-25 MCG/INH AEPB Inhale 1 puff into the lungs daily. 1 each 6  . LORazepam (ATIVAN) 0.5 MG tablet Take 0.5 mg by mouth every 8  (eight) hours.    . meloxicam (MOBIC) 15 MG tablet Take 15 mg by mouth daily.    . methocarbamol (ROBAXIN) 500 MG tablet Take 1 tablet (500 mg total) by mouth 4 (four) times daily. 120 tablet 2  . Multiple Vitamin (MULTI-VITAMIN PO) Take by mouth daily.    . Omega-3 Fatty Acids (FISH OIL PO) Take 1,000 mg by mouth.     Marland Kitchen omeprazole (PRILOSEC) 40 MG capsule Take 1 capsule by mouth daily.  1  . Ospemifene (OSPHENA PO) Take 60 mg by mouth.     Marland Kitchen PREMARIN vaginal cream 1 Applicatorful.     . valsartan-hydrochlorothiazide (DIOVAN-HCT) 160-12.5 MG tablet     . HYDROcodone-acetaminophen (NORCO) 7.5-325 MG tablet Take 1 tablet by mouth 3 (three) times daily. 90 tablet 0   Facility-Administered Medications Prior to Visit  Medication Dose Route Frequency Provider Last Rate Last Dose  . betamethasone acetate-betamethasone sodium phosphate (CELESTONE) injection 3 mg  3 mg Intramuscular Once Edrick Kins, DPM        Review of Systems CNS: No confusion or sedation Cardiac: No angina or palpitations GI: No abdominal pain or constipation Constitutional: No nausea vomiting fevers or chills  Objective:  BP 119/78   Pulse (!) 101   Temp 98.3 F (36.8 C) (Oral)   Resp 18   Ht 5\' 6"  (1.676 m)   Wt 200 lb (90.7 kg)  SpO2 95%   BMI 32.28 kg/m    BP Readings from Last 3 Encounters:  05/19/17 119/78  05/04/17 136/80  04/26/17 120/72     Wt Readings from Last 3 Encounters:  05/19/17 200 lb (90.7 kg)  05/04/17 201 lb (91.2 kg)  04/26/17 200 lb 12.8 oz (91.1 kg)     Physical Exam Pt is alert and oriented PERRL EOMI HEART IS RRR no murmur or rub LCTA no wheezing or rales MUSCULOSKELETAL reveals a trigger point over the left posterior superior iliac crest.  She has good range of motion at the left hip.  Her muscle tone and bulk to the lower extremities is intact and she walks with a mildly antalgic gait.  Labs  No results found for: HGBA1C No results found for: GLUF, MICROALBUR,  LDLCALC, CREATININE  -------------------------------------------------------------------------------------------------------------------- Lab Results  Component Value Date   WBC 6.9 03/25/2017   HGB 14.5 03/25/2017   HCT 42.9 03/25/2017   PLT 229.0 03/25/2017    --------------------------------------------------------------------------------------------------------------------- Dg Chest 2 View  Result Date: 03/18/2017 CLINICAL DATA:  Short of breath with exertion EXAM: CHEST  2 VIEW COMPARISON:  Chest x-ray of 09/07/2011 FINDINGS: No active infiltrate or effusion is seen. Mild apical pleural thickening is present. Mediastinal and hilar contours are unremarkable. The heart is within upper limits of normal. No acute bony abnormality is seen. IMPRESSION: No active cardiopulmonary disease. Electronically Signed   By: Ivar Drape M.D.   On: 03/18/2017 17:14   Dg Hips Bilat With Pelvis 3-4 Views  Result Date: 03/18/2017 CLINICAL DATA:  Bilateral hip pain for 1 year left-greater-than-right, no trauma EXAM: DG HIP (WITH OR WITHOUT PELVIS) 3-4V BILAT COMPARISON:  None. FINDINGS: For age there is very mild degenerative joint disease of both hips with only slight loss of joint space and minimal superior acetabular spurring. No acute fracture is seen. Both pelvic rami appear intact. The SI joints appear corticated. IMPRESSION: Only mild degenerative joint disease of the hips for age. No acute abnormality. Electronically Signed   By: Ivar Drape M.D.   On: 03/18/2017 17:15     Assessment & Plan:   Dula was seen today for hip pain and back pain.  Diagnoses and all orders for this visit:  Chronic pain syndrome  DDD (degenerative disc disease), lumbar  Bilateral sciatica  Chronic, continuous use of opioids  Facet arthritis of lumbosacral region [facility])  Hip pain, acute, left -     dexamethasone (DECADRON) injection 10 mg -     ropivacaine (PF) 2 mg/mL (0.2%) (NAROPIN) injection 10 mL  Other  orders -     Discontinue: HYDROcodone-acetaminophen (NORCO) 7.5-325 MG tablet; Take 1 tablet by mouth 3 (three) times daily. -     HYDROcodone-acetaminophen (NORCO) 7.5-325 MG tablet; Take 1 tablet by mouth 3 (three) times daily.        ----------------------------------------------------------------------------------------------------------------------  Problem List Items Addressed This Visit    None    Visit Diagnoses    Chronic pain syndrome    -  Primary   DDD (degenerative disc disease), lumbar       Relevant Medications   HYDROcodone-acetaminophen (NORCO) 7.5-325 MG tablet   dexamethasone (DECADRON) injection 10 mg (Start on 05/19/2017  3:30 PM)   Bilateral sciatica       Chronic, continuous use of opioids       Facet arthritis of lumbosacral region [facility])       Relevant Medications   HYDROcodone-acetaminophen (NORCO) 7.5-325 MG tablet   dexamethasone (DECADRON) injection  10 mg (Start on 05/19/2017  3:30 PM)   Hip pain, acute, left       Relevant Medications   dexamethasone (DECADRON) injection 10 mg (Start on 05/19/2017  3:30 PM)   ropivacaine (PF) 2 mg/mL (0.2%) (NAROPIN) injection 10 mL (Start on 05/19/2017  3:30 PM)        ----------------------------------------------------------------------------------------------------------------------  1. Chronic pain syndrome We will refill her medications for the next 2 months today.  We have reviewed the [facility] practitioner database information and it is appropriate.  I talked to her about ice application for the left hip pain.  2. DDD (degenerative disc disease), lumbar Continue stretching strengthening exercises and efforts at weight loss.  3. Bilateral sciatica As above  4. Chronic, continuous use of opioids As above  5. Facet arthritis of lumbosacral region ([place])   6. Hip pain, acute, left We will proceed with a trigger point injection overlying the left superior posterior iliac crest.  The risk benefits of  this were once again reviewed with her in full detail and all questions answered. - dexamethasone (DECADRON) injection 10 mg - ropivacaine (PF) 2 mg/mL (0.2%) (NAROPIN) injection 10 mL    ----------------------------------------------------------------------------------------------------------------------  I am having Beckie Busing. Molenda maintain her Calcium Carbonate-Vitamin D, Multiple Vitamin (MULTI-VITAMIN PO), meloxicam, DULoxetine, albuterol, Ospemifene (OSPHENA PO), Omega-3 Fatty Acids (FISH OIL PO), valsartan-hydrochlorothiazide, PREMARIN, LORazepam, dicyclomine, clobetasol cream, fluocinonide, methocarbamol, omeprazole, Fluticasone-Umeclidin-Vilant, Fluticasone-Umeclidin-Vilant, and HYDROcodone-acetaminophen. We will continue to administer betamethasone acetate-betamethasone sodium phosphate.   Meds ordered this encounter  Medications  . DISCONTD: HYDROcodone-acetaminophen (NORCO) 7.5-325 MG tablet    Sig: Take 1 tablet by mouth 3 (three) times daily.    Dispense:  90 tablet    Refill:  0    Do not fill until [id number]  . HYDROcodone-acetaminophen (NORCO) 7.5-325 MG tablet    Sig: Take 1 tablet by mouth 3 (three) times daily.    Dispense:  90 tablet    Refill:  0    Do not fill until [id number]  . dexamethasone (DECADRON) injection 10 mg  . ropivacaine (PF) 2 mg/mL (0.2%) (NAROPIN) injection 10 mL   Patient's Medications  New Prescriptions   No medications on file  Previous Medications   ALBUTEROL (PROAIR HFA) 108 (90 BASE) MCG/ACT INHALER    Inhale 2 puffs into the lungs every 6 (six) hours as needed.   CALCIUM CARBONATE-VITAMIN D (CALCIUM + D) 600-200 MG-UNIT TABS    Take by mouth daily.   CLOBETASOL CREAM (TEMOVATE) 0.05 %    Apply 1 application topically 2 (two) times daily.   DICYCLOMINE (BENTYL) 20 MG TABLET    Take 20 mg by mouth every 6 (six) hours as needed.    DULOXETINE (CYMBALTA) 60 MG CAPSULE    Take 60 mg by mouth daily.   FLUOCINONIDE (LIDEX) 0.05 % EXTERNAL  SOLUTION    Apply 1 application topically 2 (two) times daily.   FLUTICASONE-UMECLIDIN-VILANT (TRELEGY ELLIPTA) 100-62.5-25 MCG/INH AEPB    Inhale 1 puff into the lungs daily.   FLUTICASONE-UMECLIDIN-VILANT (TRELEGY ELLIPTA) 100-62.5-25 MCG/INH AEPB    Inhale 1 puff into the lungs daily.   LORAZEPAM (ATIVAN) 0.5 MG TABLET    Take 0.5 mg by mouth every 8 (eight) hours.   MELOXICAM (MOBIC) 15 MG TABLET    Take 15 mg by mouth daily.   METHOCARBAMOL (ROBAXIN) 500 MG TABLET    Take 1 tablet (500 mg total) by mouth 4 (four) times daily.   MULTIPLE VITAMIN (MULTI-VITAMIN PO)  Take by mouth daily.   OMEGA-3 FATTY ACIDS (FISH OIL PO)    Take 1,000 mg by mouth.    OMEPRAZOLE (PRILOSEC) 40 MG CAPSULE    Take 1 capsule by mouth daily.   OSPEMIFENE (OSPHENA PO)    Take 60 mg by mouth.    PREMARIN VAGINAL CREAM    1 Applicatorful.    VALSARTAN-HYDROCHLOROTHIAZIDE (DIOVAN-HCT) 160-12.5 MG TABLET      Modified Medications   Modified Medication Previous Medication   HYDROCODONE-ACETAMINOPHEN (NORCO) 7.5-325 MG TABLET HYDROcodone-acetaminophen (NORCO) 7.5-325 MG tablet      Take 1 tablet by mouth 3 (three) times daily.    Take 1 tablet by mouth 3 (three) times daily.  Discontinued Medications   No medications on file   ----------------------------------------------------------------------------------------------------------------------  Follow-up: Return in about 2 months (around 07/19/2017) for evaluation, med refill.   Left trigger point injection overlying the left anterior superior iliac crest  Trigger point injection: The area overlying the aforementioned trigger points were prepped with alcohol. They were then injected with a 25-gauge needle with 8 cc of ropivacaine 0.2% and Decadron 8 mg at each site after negative aspiration for heme. This was performed after informed consent was obtained and risks and benefits reviewed. She tolerated this procedure without difficulty and was convalesced and  discharged to home in stable condition for follow-up as mentioned.  @James  T[place]ia, MD@  Molli Barrows, MD

## 2017-05-19 NOTE — Telephone Encounter (Signed)
Encounter complete. 

## 2017-05-19 NOTE — Progress Notes (Signed)
Nursing Pain Medication Assessment:  Safety precautions to be maintained throughout the outpatient stay will include: orient to surroundings, keep bed in low position, maintain call bell within reach at all times, provide assistance with transfer out of bed and ambulation.  Medication Inspection Compliance: Pill count conducted under aseptic conditions, in front of the patient. Neither the pills nor the bottle was removed from the patient's sight at any time. Once count was completed pills were immediately returned to the patient in their original bottle.  Medication: Hydrocodone/APAP Pill/Patch Count: 39 of 90 pills remain Pill/Patch Appearance: Markings consistent with prescribed medication Bottle Appearance: Standard pharmacy container. Clearly labeled. Filled Date: 03 / 08 / 2019 Last Medication intake:  Today

## 2017-05-25 ENCOUNTER — Ambulatory Visit (HOSPITAL_COMMUNITY)
Admission: RE | Admit: 2017-05-25 | Discharge: 2017-05-25 | Disposition: A | Discharge status: Home / Self Care | Payer: Medicare Other | Source: Ambulatory Visit | Attending: Cardiology | Admitting: Cardiology

## 2017-05-25 DIAGNOSIS — R0602 Shortness of breath: Secondary | ICD-10-CM

## 2017-05-25 LAB — MYOCARDIAL PERFUSION IMAGING
CHL CUP RESTING HR STRESS: 73 {beats}/min
LVDIAVOL: 89 mL (ref 46–106)
LVSYSVOL: 43 mL
Peak HR: 93 {beats}/min
SDS: 7
SRS: 0
SSS: 7
TID: 1.32

## 2017-05-25 MED ORDER — REGADENOSON 0.4 MG/5ML IV SOLN
0.4000 mg | Freq: Once | INTRAVENOUS | Status: AC
  Administered 2017-05-25: 0.4 mg via INTRAVENOUS

## 2017-05-25 MED ORDER — TECHNETIUM TC 99M TETROFOSMIN IV KIT
28.7000 | PACK | Freq: Once | INTRAVENOUS | Status: AC | PRN
  Administered 2017-05-25: 28.7 via INTRAVENOUS
  Filled 2017-05-25: qty 29

## 2017-05-25 MED ORDER — TECHNETIUM TC 99M TETROFOSMIN IV KIT
10.4000 | PACK | Freq: Once | INTRAVENOUS | Status: AC | PRN
  Administered 2017-05-25: 10.4 via INTRAVENOUS
  Filled 2017-05-25: qty 11

## 2017-06-01 NOTE — Progress Notes (Signed)
Cardiology Office Note   Date:  06/03/2017   ID:  Kelsey, Diaz 1945-05-28, MRN [id number]  PCP:  Harlan Stains, MD  Cardiologist:   No primary care provider on file. Referring:  Harlan Stains, MD  Chief Complaint  Patient presents with  . Chest Pain      History of Present Illness: Kelsey Diaz is a 72 y.o. female who presents for follow up of chest pain and SOB.  She reports a distant cardiac catheterization although I do not have these records.  She did have a stress perfusion study that was negative in 2011.   BNP was normal.    Lexiscan Myoview could not exclude balanced ischemia.   Since that time she is continued to get chest discomfort.  As described previously this is been happening for some years but is getting worse.  It happens sporadically.  She does not bring it on with activity.  She does not associated with foods.  She did have an EGD and esophageal stretching and her symptoms did not improve.  She says it is severe in intensity when it happens lasting for about 5 minutes.  She describes a substernal discomfort not associated with the usual "acid taste".  She does have chronic shortness of breath and is treated for COPD.  She does not describe any PND or orthopnea.  She does not describe any new palpitations, presyncope or syncope.  Of note with her chest pain she gets slightly diaphoretic.   Past Medical History:  Diagnosis Date  . Allergic rhinitis   . Anxiety   . Asthma   . Colitis   . COPD (chronic obstructive pulmonary disease) ([place])   . Fibromyalgia   . GERD (gastroesophageal reflux disease)    With esophageal strictures  . Hypertension   . Hyperthyroidism   . OSA (obstructive sleep apnea) 02/09/2012   No mask.  Did not tolerate  . Osteoarthritis     Past Surgical History:  Procedure Laterality Date  . BREAST BIOPSY     x2  . CHOLECYSTECTOMY     2005  . KNEE SURGERY Right 2013   two torn ligaments repaired.   Marland Kitchen NASAL SINUS SURGERY     . VAGINAL HYSTERECTOMY       Current Outpatient Medications  Medication Sig Dispense Refill  . albuterol (PROAIR HFA) 108 (90 BASE) MCG/ACT inhaler Inhale 2 puffs into the lungs every 6 (six) hours as needed.    . Calcium Carbonate-Vitamin D (CALCIUM + D) 600-200 MG-UNIT TABS Take by mouth daily.    . clobetasol cream (TEMOVATE) 7.01 % Apply 1 application topically 2 (two) times daily.    . DULoxetine (CYMBALTA) 60 MG capsule Take 60 mg by mouth daily.    . fluocinonide (LIDEX) 0.05 % external solution Apply 1 application topically 2 (two) times daily.    . Fluticasone-Umeclidin-Vilant (TRELEGY ELLIPTA) 100-62.5-25 MCG/INH AEPB Inhale 1 puff into the lungs daily. 1 each 6  . HYDROcodone-acetaminophen (NORCO) 7.5-325 MG tablet Take 1 tablet by mouth 3 (three) times daily. 90 tablet 0  . LORazepam (ATIVAN) 0.5 MG tablet Take 0.5 mg by mouth every 8 (eight) hours.    . meloxicam (MOBIC) 15 MG tablet Take 15 mg by mouth daily.    . methocarbamol (ROBAXIN) 500 MG tablet Take 1 tablet (500 mg total) by mouth 4 (four) times daily. 120 tablet 2  . Multiple Vitamin (MULTI-VITAMIN PO) Take by mouth daily.    . Omega-3 Fatty Acids (  FISH OIL PO) Take 1,000 mg by mouth.     Marland Kitchen omeprazole (PRILOSEC) 40 MG capsule Take 1 capsule by mouth daily.  1  . Ospemifene (OSPHENA PO) Take 60 mg by mouth.     Marland Kitchen PREMARIN vaginal cream 1 Applicatorful.     . valsartan-hydrochlorothiazide (DIOVAN-HCT) 160-12.5 MG tablet      Current Facility-Administered Medications  Medication Dose Route Frequency Provider Last Rate Last Dose  . betamethasone acetate-betamethasone sodium phosphate (CELESTONE) injection 3 mg  3 mg Intramuscular Once Edrick Kins, DPM        Allergies:   Advair diskus [fluticasone-salmeterol]; Ambien [zolpidem tartrate]; Amlodipine besylate; Bextra [valdecoxib]; Captopril; Latex; Lisinopril; Morphine; Oxycodone hcl; Pulmicort [budesonide]; Savella [milnacipran hcl]; and Spiriva [tiotropium bromide  monohydrate]     ROS:  Please see the history of present illness.   Otherwise, review of systems are positive for none .   All other systems are reviewed and negative.    PHYSICAL EXAM: VS:  BP 120/68   Pulse 93   Ht 5' 6.5" (1.689 m)   Wt 202 lb 12.8 oz (92 kg)   BMI 32.24 kg/m  , BMI Body mass index is 32.24 kg/m.  GENERAL:  Well appearing NECK:  No jugular venous distention, waveform within normal limits, carotid upstroke brisk and symmetric, no bruits, no thyromegaly LUNGS:  Clear to auscultation bilaterally CHEST:  Unremarkable HEART:  PMI not displaced or sustained,S1 and S2 within normal limits, no S3, no S4, no clicks, no rubs, no murmurs ABD:  Flat, positive bowel sounds normal in frequency in pitch, no bruits, no rebound, no guarding, no midline pulsatile mass, no hepatomegaly, no splenomegaly EXT:  2 plus pulses throughout, no edema, no cyanosis no clubbing   EKG:  EKG is not ordered today. The ekg ordered 04/26/17 demonstrates sinus rhythm, rate 78, axis within normal limits, intervals WNL, no acute ST T wave changes.    Recent Labs: 03/25/2017: Hemoglobin 14.5; Platelets 229.0 04/26/2017: BNP 20.8    Lipid Panel No results found for: CHOL, TRIG, HDL, CHOLHDL, VLDL, LDLCALC, LDLDIRECT    Wt Readings from Last 3 Encounters:  06/03/17 202 lb 12.8 oz (92 kg)  05/25/17 201 lb (91.2 kg)  05/19/17 200 lb (90.7 kg)      Other studies Reviewed: Additional studies/ records that were reviewed today include: Lexiscan Myoview. Review of the above records demonstrates:     ASSESSMENT AND PLAN:  DYSPNEA:  BNP was normal.  She is followed by a pulmonologist and is being treated for COPD.   However, she did have an abnormal stress as mentioned above.  See further evaluation below.    CHEST PAIN:    Given the ongoing symptoms and the perfusion study that could not exclude balanced ischemia she needs cardiac catheterization. The patient understands that risks included but  are not limited to stroke (1 in 1000), death (1 in 47), kidney failure [usually temporary] (1 in 500), bleeding (1 in 200), allergic reaction [possibly serious] (1 in 200).  The patient understands and agrees to proceed.   HTN:  The blood pressure is at target.  No change in therapy.    Current medicines are reviewed at length with the patient today.  The patient does not have concerns regarding medicines.  The following changes have been made:  None  Labs/ tests ordered today include:   Orders Placed This Encounter  Procedures  . CBC  . Comprehensive Metabolic Panel (CMET)  . TSH  . INR/PT  .  APTT     Disposition:   FU with me as needed after the cath. Ronnell Guadalajara, MD  06/03/2017 12:32 PM    [place]

## 2017-06-01 NOTE — H&P (View-Only) (Signed)
Cardiology Office Note   Date:  06/03/2017   ID:  Kelsey, Diaz March 15, 1945, MRN [id number]  PCP:  Kelsey Stains, MD  Cardiologist:   No primary care provider on file. Referring:  Kelsey Stains, MD  Chief Complaint  Patient presents with  . Chest Pain      History of Present Illness: Kelsey Diaz is a 72 y.o. female who presents for follow up of chest pain and SOB.  She reports a distant cardiac catheterization although I do not have these records.  She did have a stress perfusion study that was negative in 2011.   BNP was normal.    Lexiscan Myoview could not exclude balanced ischemia.   Since that time she is continued to get chest discomfort.  As described previously this is been happening for some years but is getting worse.  It happens sporadically.  She does not bring it on with activity.  She does not associated with foods.  She did have an EGD and esophageal stretching and her symptoms did not improve.  She says it is severe in intensity when it happens lasting for about 5 minutes.  She describes a substernal discomfort not associated with the usual "acid taste".  She does have chronic shortness of breath and is treated for COPD.  She does not describe any PND or orthopnea.  She does not describe any new palpitations, presyncope or syncope.  Of note with her chest pain she gets slightly diaphoretic.   Past Medical History:  Diagnosis Date  . Allergic rhinitis   . Anxiety   . Asthma   . Colitis   . COPD (chronic obstructive pulmonary disease) ([place])   . Fibromyalgia   . GERD (gastroesophageal reflux disease)    With esophageal strictures  . Hypertension   . Hyperthyroidism   . OSA (obstructive sleep apnea) 02/09/2012   No mask.  Did not tolerate  . Osteoarthritis     Past Surgical History:  Procedure Laterality Date  . BREAST BIOPSY     x2  . CHOLECYSTECTOMY     2005  . KNEE SURGERY Right 2013   two torn ligaments repaired.   Marland Kitchen NASAL SINUS SURGERY     . VAGINAL HYSTERECTOMY       Current Outpatient Medications  Medication Sig Dispense Refill  . albuterol (PROAIR HFA) 108 (90 BASE) MCG/ACT inhaler Inhale 2 puffs into the lungs every 6 (six) hours as needed.    . Calcium Carbonate-Vitamin D (CALCIUM + D) 600-200 MG-UNIT TABS Take by mouth daily.    . clobetasol cream (TEMOVATE) 0.34 % Apply 1 application topically 2 (two) times daily.    . DULoxetine (CYMBALTA) 60 MG capsule Take 60 mg by mouth daily.    . fluocinonide (LIDEX) 0.05 % external solution Apply 1 application topically 2 (two) times daily.    . Fluticasone-Umeclidin-Vilant (TRELEGY ELLIPTA) 100-62.5-25 MCG/INH AEPB Inhale 1 puff into the lungs daily. 1 each 6  . HYDROcodone-acetaminophen (NORCO) 7.5-325 MG tablet Take 1 tablet by mouth 3 (three) times daily. 90 tablet 0  . LORazepam (ATIVAN) 0.5 MG tablet Take 0.5 mg by mouth every 8 (eight) hours.    . meloxicam (MOBIC) 15 MG tablet Take 15 mg by mouth daily.    . methocarbamol (ROBAXIN) 500 MG tablet Take 1 tablet (500 mg total) by mouth 4 (four) times daily. 120 tablet 2  . Multiple Vitamin (MULTI-VITAMIN PO) Take by mouth daily.    . Omega-3 Fatty Acids (  FISH OIL PO) Take 1,000 mg by mouth.     Marland Kitchen omeprazole (PRILOSEC) 40 MG capsule Take 1 capsule by mouth daily.  1  . Ospemifene (OSPHENA PO) Take 60 mg by mouth.     Marland Kitchen PREMARIN vaginal cream 1 Applicatorful.     . valsartan-hydrochlorothiazide (DIOVAN-HCT) 160-12.5 MG tablet      Current Facility-Administered Medications  Medication Dose Route Frequency Provider Last Rate Last Dose  . betamethasone acetate-betamethasone sodium phosphate (CELESTONE) injection 3 mg  3 mg Intramuscular Once Edrick Kins, DPM        Allergies:   Advair diskus [fluticasone-salmeterol]; Ambien [zolpidem tartrate]; Amlodipine besylate; Bextra [valdecoxib]; Captopril; Latex; Lisinopril; Morphine; Oxycodone hcl; Pulmicort [budesonide]; Savella [milnacipran hcl]; and Spiriva [tiotropium bromide  monohydrate]     ROS:  Please see the history of present illness.   Otherwise, review of systems are positive for none .   All other systems are reviewed and negative.    PHYSICAL EXAM: VS:  BP 120/68   Pulse 93   Ht 5' 6.5" (1.689 m)   Wt 202 lb 12.8 oz (92 kg)   BMI 32.24 kg/m  , BMI Body mass index is 32.24 kg/m.  GENERAL:  Well appearing NECK:  No jugular venous distention, waveform within normal limits, carotid upstroke brisk and symmetric, no bruits, no thyromegaly LUNGS:  Clear to auscultation bilaterally CHEST:  Unremarkable HEART:  PMI not displaced or sustained,S1 and S2 within normal limits, no S3, no S4, no clicks, no rubs, no murmurs ABD:  Flat, positive bowel sounds normal in frequency in pitch, no bruits, no rebound, no guarding, no midline pulsatile mass, no hepatomegaly, no splenomegaly EXT:  2 plus pulses throughout, no edema, no cyanosis no clubbing   EKG:  EKG is not ordered today. The ekg ordered 04/26/17 demonstrates sinus rhythm, rate 78, axis within normal limits, intervals WNL, no acute ST T wave changes.    Recent Labs: 03/25/2017: Hemoglobin 14.5; Platelets 229.0 04/26/2017: BNP 20.8    Lipid Panel No results found for: CHOL, TRIG, HDL, CHOLHDL, VLDL, LDLCALC, LDLDIRECT    Wt Readings from Last 3 Encounters:  06/03/17 202 lb 12.8 oz (92 kg)  05/25/17 201 lb (91.2 kg)  05/19/17 200 lb (90.7 kg)      Other studies Reviewed: Additional studies/ records that were reviewed today include: Lexiscan Myoview. Review of the above records demonstrates:     ASSESSMENT AND PLAN:  DYSPNEA:  BNP was normal.  She is followed by a pulmonologist and is being treated for COPD.   However, she did have an abnormal stress as mentioned above.  See further evaluation below.    CHEST PAIN:    Given the ongoing symptoms and the perfusion study that could not exclude balanced ischemia she needs cardiac catheterization. The patient understands that risks included but  are not limited to stroke (1 in 1000), death (1 in 70), kidney failure [usually temporary] (1 in 500), bleeding (1 in 200), allergic reaction [possibly serious] (1 in 200).  The patient understands and agrees to proceed.   HTN:  The blood pressure is at target.  No change in therapy.    Current medicines are reviewed at length with the patient today.  The patient does not have concerns regarding medicines.  The following changes have been made:  None  Labs/ tests ordered today include:   Orders Placed This Encounter  Procedures  . CBC  . Comprehensive Metabolic Panel (CMET)  . TSH  . INR/PT  .  APTT     Disposition:   FU with me as needed after the cath. Ronnell Guadalajara, MD  06/03/2017 12:32 PM    [place]

## 2017-06-03 ENCOUNTER — Encounter: Payer: Self-pay | Admitting: Cardiology

## 2017-06-03 ENCOUNTER — Ambulatory Visit: Payer: Medicare Other | Admitting: Cardiology

## 2017-06-03 VITALS — BP 120/68 | HR 93 | Ht 66.5 in | Wt 202.8 lb

## 2017-06-03 DIAGNOSIS — R079 Chest pain, unspecified: Secondary | ICD-10-CM | POA: Insufficient documentation

## 2017-06-03 DIAGNOSIS — I10 Essential (primary) hypertension: Secondary | ICD-10-CM | POA: Diagnosis not present

## 2017-06-03 DIAGNOSIS — R0789 Other chest pain: Secondary | ICD-10-CM

## 2017-06-03 DIAGNOSIS — R9439 Abnormal result of other cardiovascular function study: Secondary | ICD-10-CM | POA: Diagnosis not present

## 2017-06-03 DIAGNOSIS — R5383 Other fatigue: Secondary | ICD-10-CM | POA: Diagnosis not present

## 2017-06-03 DIAGNOSIS — D689 Coagulation defect, unspecified: Secondary | ICD-10-CM

## 2017-06-03 HISTORY — DX: Abnormal result of other cardiovascular function study: R94.39

## 2017-06-03 HISTORY — DX: Chest pain, unspecified: R07.9

## 2017-06-03 LAB — CBC
HEMATOCRIT: 42.1 % (ref 34.0–46.6)
HEMOGLOBIN: 14.2 g/dL (ref 11.1–15.9)
MCH: 29.3 pg (ref 26.6–33.0)
MCHC: 33.7 g/dL (ref 31.5–35.7)
MCV: 87 fL (ref 79–97)
Platelets: 244 10*3/uL (ref 150–379)
RBC: 4.85 x10E6/uL (ref 3.77–5.28)
RDW: 13.1 % (ref 12.3–15.4)
WBC: 5.9 10*3/uL (ref 3.4–10.8)

## 2017-06-03 LAB — COMPREHENSIVE METABOLIC PANEL
ALBUMIN: 4.3 g/dL (ref 3.5–4.8)
ALK PHOS: 82 IU/L (ref 39–117)
ALT: 20 IU/L (ref 0–32)
AST: 22 IU/L (ref 0–40)
Albumin/Globulin Ratio: 1.7 (ref 1.2–2.2)
BUN / CREAT RATIO: 28 (ref 12–28)
BUN: 19 mg/dL (ref 8–27)
Bilirubin Total: 0.4 mg/dL (ref 0.0–1.2)
CO2: 21 mmol/L (ref 20–29)
CREATININE: 0.69 mg/dL (ref 0.57–1.00)
Calcium: 9.4 mg/dL (ref 8.7–10.3)
Chloride: 100 mmol/L (ref 96–106)
GFR calc Af Amer: 101 mL/min/{1.73_m2} (ref >59)
GFR, EST NON AFRICAN AMERICAN: 87 mL/min/{1.73_m2} (ref >59)
GLOBULIN, TOTAL: 2.6 g/dL (ref 1.5–4.5)
Glucose: 134 mg/dL — ABNORMAL HIGH (ref 65–99)
Potassium: 4.1 mmol/L (ref 3.5–5.2)
SODIUM: 137 mmol/L (ref 134–144)
Total Protein: 6.9 g/dL (ref 6.0–8.5)

## 2017-06-03 LAB — PROTIME-INR
INR: 1 (ref 0.8–1.2)
Prothrombin Time: 10.3 s (ref 9.1–12.0)

## 2017-06-03 LAB — APTT: aPTT: 27 s (ref 24–33)

## 2017-06-03 LAB — TSH: TSH: 1.26 u[IU]/mL (ref 0.450–4.500)

## 2017-06-03 NOTE — Patient Instructions (Addendum)
Medication Instructions:  Continue current medications  If you need a refill on your cardiac medications before your next appointment, please call your pharmacy.  Labwork: Pre Op Lab HERE IN OUR OFFICE AT [facility]  Take the provided lab slips for you to take with you to the lab for you blood draw.   You will NOT need to fast   Testing/Procedures: Your physician has requested that you have a cardiac catheterization. Cardiac catheterization is used to diagnose and/or treat various heart conditions. Doctors may recommend this procedure for a number of different reasons. The most common reason is to evaluate chest pain. Chest pain can be a symptom of coronary artery disease (CAD), and cardiac catheterization can show whether plaque is narrowing or blocking your heart's arteries. This procedure is also used to evaluate the valves, as well as measure the blood flow and oxygen levels in different parts of your heart. For further information please visit [email or web address]. Please follow instruction sheet, as given.   Special Instructions:   Kelsey Diaz [address] [place] [address] Dept: [phone] Loc: [phone]  Kelsey Diaz  06/03/2017  You are scheduled for a Cardiac Catheterization on Tuesday, April 16 with Dr. Shelva Majestic.  1. Please arrive at the [facility] (Main Entrance A) at [facility]: [address] [place], [place] [address] at 8:00 AM (two hours before your procedure to ensure your preparation). Free valet parking service is available.   Special note: Every effort is made to have your procedure done on time. Please understand that emergencies sometimes delay scheduled procedures.  2. Diet: Do not eat or drink anything after midnight prior to your procedure except sips of water to take medications.  3. Labs: Today  4. Medication instructions in preparation for  your procedure:   Current Outpatient Medications (Endocrine & Metabolic):  Marland Kitchen  Ospemifene (OSPHENA PO), Take 60 mg by mouth.   Current Facility-Administered Medications (Endocrine & Metabolic):  .  betamethasone acetate-betamethasone sodium phosphate (CELESTONE) injection 3 mg  Current Outpatient Medications (Cardiovascular):  .  valsartan-hydrochlorothiazide (DIOVAN-HCT) 160-12.5 MG tablet,    Current Outpatient Medications (Respiratory):  .  albuterol (PROAIR HFA) 108 (90 BASE) MCG/ACT inhaler, Inhale 2 puffs into the lungs every 6 (six) hours as needed. .  Fluticasone-Umeclidin-Vilant (TRELEGY ELLIPTA) 100-62.5-25 MCG/INH AEPB, Inhale 1 puff into the lungs daily.   Current Outpatient Medications (Analgesics):  .  HYDROcodone-acetaminophen (NORCO) 7.5-325 MG tablet, Take 1 tablet by mouth 3 (three) times daily. .  meloxicam (MOBIC) 15 MG tablet, Take 15 mg by mouth daily.     Current Outpatient Medications (Other):  Marland Kitchen  Calcium Carbonate-Vitamin D (CALCIUM + D) 600-200 MG-UNIT TABS, Take by mouth daily. .  clobetasol cream (TEMOVATE) 6.44 %, Apply 1 application topically 2 (two) times daily. .  DULoxetine (CYMBALTA) 60 MG capsule, Take 60 mg by mouth daily. .  fluocinonide (LIDEX) 0.05 % external solution, Apply 1 application topically 2 (two) times daily. Marland Kitchen  LORazepam (ATIVAN) 0.5 MG tablet, Take 0.5 mg by mouth every 8 (eight) hours. .  methocarbamol (ROBAXIN) 500 MG tablet, Take 1 tablet (500 mg total) by mouth 4 (four) times daily. .  Multiple Vitamin (MULTI-VITAMIN PO), Take by mouth daily. .  Omega-3 Fatty Acids (FISH OIL PO), Take 1,000 mg by mouth.  Marland Kitchen  omeprazole (PRILOSEC) 40 MG capsule, Take 1 capsule by mouth daily. Marland Kitchen  PREMARIN vaginal cream, 1 Applicatorful.   *For reference  purposes while preparing patient instructions.   Delete this med list prior to printing instructions for patient.*   On the morning of your procedure, take your morning medicines listed above.   You may use sips of water.  5. Plan for one night stay--bring personal belongings. 6. Bring a current list of your medications and current insurance cards. 7. You MUST have a responsible person to drive you home. 8. Someone MUST be with you the first 24 hours after you arrive home or your discharge will be delayed. 9. Please wear clothes that are easy to get on and off and wear slip-on shoes.  Thank you for allowing [place] to care for you!   -- [facility]   Follow-Up: Your physician wants you to follow-up in: After Cath.     Thank you for choosing [facility] at [facility]!!

## 2017-06-07 ENCOUNTER — Telehealth: Payer: Self-pay | Admitting: *Deleted

## 2017-06-07 NOTE — Telephone Encounter (Signed)
Pt contacted pre-catheterization scheduled at [facility] for: Tuesday June 08, 2017 10:30 AM Verified arrival time and place: [place] Entrance A/[place] at: 8 AM Nothing to eat or drink after midnight prior to cath.  Hold: Meloxicam AM of cath Valsartan-HCT AM of cath   Except hold medications AM meds can be  taken pre-cath with sip of water including: ASA 81 mg   Confirmed patient has responsible person to drive home post procedure and observe patient for 24 hours: yes

## 2017-06-08 ENCOUNTER — Ambulatory Visit (HOSPITAL_COMMUNITY)
Admission: RE | Admit: 2017-06-08 | Discharge: 2017-06-08 | Disposition: A | Discharge status: Home / Self Care | Payer: Medicare Other | Source: Ambulatory Visit | Attending: Cardiovascular Disease | Admitting: Cardiovascular Disease

## 2017-06-08 ENCOUNTER — Ambulatory Visit (HOSPITAL_COMMUNITY)
Admission: RE | Disposition: A | Discharge status: Home / Self Care | Payer: Self-pay | Source: Ambulatory Visit | Attending: Cardiovascular Disease

## 2017-06-08 DIAGNOSIS — Z885 Allergy status to narcotic agent status: Secondary | ICD-10-CM | POA: Diagnosis not present

## 2017-06-08 DIAGNOSIS — E0590 Thyrotoxicosis, unspecified without thyrotoxic crisis or storm: Secondary | ICD-10-CM | POA: Diagnosis not present

## 2017-06-08 DIAGNOSIS — K219 Gastro-esophageal reflux disease without esophagitis: Secondary | ICD-10-CM | POA: Insufficient documentation

## 2017-06-08 DIAGNOSIS — Z91040 Latex allergy status: Secondary | ICD-10-CM | POA: Insufficient documentation

## 2017-06-08 DIAGNOSIS — F419 Anxiety disorder, unspecified: Secondary | ICD-10-CM | POA: Diagnosis not present

## 2017-06-08 DIAGNOSIS — R9439 Abnormal result of other cardiovascular function study: Secondary | ICD-10-CM | POA: Insufficient documentation

## 2017-06-08 DIAGNOSIS — G4733 Obstructive sleep apnea (adult) (pediatric): Secondary | ICD-10-CM | POA: Diagnosis not present

## 2017-06-08 DIAGNOSIS — M797 Fibromyalgia: Secondary | ICD-10-CM | POA: Insufficient documentation

## 2017-06-08 DIAGNOSIS — I10 Essential (primary) hypertension: Secondary | ICD-10-CM | POA: Insufficient documentation

## 2017-06-08 DIAGNOSIS — Z7951 Long term (current) use of inhaled steroids: Secondary | ICD-10-CM | POA: Diagnosis not present

## 2017-06-08 DIAGNOSIS — R079 Chest pain, unspecified: Secondary | ICD-10-CM

## 2017-06-08 DIAGNOSIS — M1990 Unspecified osteoarthritis, unspecified site: Secondary | ICD-10-CM | POA: Insufficient documentation

## 2017-06-08 DIAGNOSIS — R0789 Other chest pain: Secondary | ICD-10-CM | POA: Diagnosis not present

## 2017-06-08 DIAGNOSIS — J449 Chronic obstructive pulmonary disease, unspecified: Secondary | ICD-10-CM | POA: Insufficient documentation

## 2017-06-08 HISTORY — PX: LEFT HEART CATH AND CORONARY ANGIOGRAPHY: CATH118249

## 2017-06-08 SURGERY — LEFT HEART CATH AND CORONARY ANGIOGRAPHY
Anesthesia: LOCAL

## 2017-06-08 MED ORDER — HEPARIN SODIUM (PORCINE) 1000 UNIT/ML IJ SOLN
INTRAMUSCULAR | Status: DC | PRN
  Administered 2017-06-08: 4000 [IU] via INTRAVENOUS

## 2017-06-08 MED ORDER — SODIUM CHLORIDE 0.9 % IV SOLN: 250.0000 mL | INTRAVENOUS | Status: DC | PRN

## 2017-06-08 MED ORDER — SODIUM CHLORIDE 0.9% FLUSH: 3.0000 mL | INTRAVENOUS | Status: DC | PRN

## 2017-06-08 MED ORDER — SODIUM CHLORIDE 0.9 % IV SOLN: INTRAVENOUS | Status: DC

## 2017-06-08 MED ORDER — MIDAZOLAM HCL 2 MG/2ML IJ SOLN
INTRAMUSCULAR | Status: DC | PRN
  Administered 2017-06-08: 2 mg via INTRAVENOUS
  Administered 2017-06-08: 1 mg via INTRAVENOUS

## 2017-06-08 MED ORDER — MIDAZOLAM HCL 2 MG/2ML IJ SOLN
INTRAMUSCULAR | Status: AC
  Filled 2017-06-08: qty 2

## 2017-06-08 MED ORDER — VERAPAMIL HCL 2.5 MG/ML IV SOLN
INTRAVENOUS | Status: AC
  Filled 2017-06-08: qty 2

## 2017-06-08 MED ORDER — SODIUM CHLORIDE 0.9% FLUSH: 3.0000 mL | Freq: Two times a day (BID) | INTRAVENOUS | Status: DC

## 2017-06-08 MED ORDER — DIAZEPAM 5 MG PO TABS: 5.0000 mg | ORAL_TABLET | Freq: Four times a day (QID) | ORAL | Status: DC | PRN

## 2017-06-08 MED ORDER — SODIUM CHLORIDE 0.9 % WEIGHT BASED INFUSION: 1.0000 mL/kg/h | INTRAVENOUS | Status: DC

## 2017-06-08 MED ORDER — LIDOCAINE HCL (PF) 1 % IJ SOLN
INTRAMUSCULAR | Status: AC
  Filled 2017-06-08: qty 30

## 2017-06-08 MED ORDER — FENTANYL CITRATE (PF) 100 MCG/2ML IJ SOLN
INTRAMUSCULAR | Status: DC | PRN
  Administered 2017-06-08 (×2): 25 ug via INTRAVENOUS

## 2017-06-08 MED ORDER — LIDOCAINE HCL (PF) 1 % IJ SOLN
INTRAMUSCULAR | Status: DC | PRN
  Administered 2017-06-08: 2 mL

## 2017-06-08 MED ORDER — ACETAMINOPHEN 325 MG PO TABS: 650.0000 mg | ORAL_TABLET | ORAL | Status: DC | PRN

## 2017-06-08 MED ORDER — HEPARIN (PORCINE) IN NACL 2-0.9 UNIT/ML-% IJ SOLN
INTRAMUSCULAR | Status: AC | PRN
  Administered 2017-06-08 (×2): 500 mL via INTRA_ARTERIAL

## 2017-06-08 MED ORDER — HEPARIN (PORCINE) IN NACL 2-0.9 UNIT/ML-% IJ SOLN
INTRAMUSCULAR | Status: AC
  Filled 2017-06-08: qty 1000

## 2017-06-08 MED ORDER — VERAPAMIL HCL 2.5 MG/ML IV SOLN
INTRAVENOUS | Status: DC | PRN
  Administered 2017-06-08: 11:00:00 via INTRA_ARTERIAL

## 2017-06-08 MED ORDER — IOHEXOL 350 MG/ML SOLN
INTRAVENOUS | Status: DC | PRN
  Administered 2017-06-08: 115 mL via INTRA_ARTERIAL

## 2017-06-08 MED ORDER — ONDANSETRON HCL 4 MG/2ML IJ SOLN: 4.0000 mg | Freq: Four times a day (QID) | INTRAMUSCULAR | Status: DC | PRN

## 2017-06-08 MED ORDER — FENTANYL CITRATE (PF) 100 MCG/2ML IJ SOLN
INTRAMUSCULAR | Status: AC
  Filled 2017-06-08: qty 2

## 2017-06-08 MED ORDER — ASPIRIN 81 MG PO CHEW: 81.0000 mg | CHEWABLE_TABLET | ORAL | Status: DC

## 2017-06-08 MED ORDER — SODIUM CHLORIDE 0.9 % WEIGHT BASED INFUSION
3.0000 mL/kg/h | INTRAVENOUS | Status: AC
  Administered 2017-06-08: 3 mL/kg/h via INTRAVENOUS

## 2017-06-08 MED ORDER — HEPARIN SODIUM (PORCINE) 1000 UNIT/ML IJ SOLN
INTRAMUSCULAR | Status: AC
  Filled 2017-06-08: qty 1

## 2017-06-08 SURGICAL SUPPLY — 19 items
CATH INFINITI 5 FR 3DRC (CATHETERS) ×1 IMPLANT
CATH INFINITI 5 FR JL3.5 (CATHETERS) ×1 IMPLANT
CATH INFINITI 5FR ANG PIGTAIL (CATHETERS) ×1 IMPLANT
CATH INFINITI JR4 5F (CATHETERS) ×1 IMPLANT
CATH LAUNCHER 5F EBU3.5 (CATHETERS) ×1 IMPLANT
CATH LAUNCHER 5F RADL (CATHETERS) IMPLANT
CATH OPTITORQUE TIG 4.0 5F (CATHETERS) ×1 IMPLANT
CATHETER LAUNCHER 5F RADL (CATHETERS) ×2
GUIDEWIRE INQWIRE 1.5J.035X260 (WIRE) IMPLANT
INQWIRE 1.5J .035X260CM (WIRE) ×2
KIT HEART LEFT (KITS) ×2 IMPLANT
NDL PERC 21GX4CM (NEEDLE) IMPLANT
NEEDLE PERC 21GX4CM (NEEDLE) ×2 IMPLANT
PACK CARDIAC CATHETERIZATION (CUSTOM PROCEDURE TRAY) ×2 IMPLANT
SHEATH RAIN RADIAL 21G 6FR (SHEATH) ×1 IMPLANT
SYR MEDRAD MARK V 150ML (SYRINGE) ×2 IMPLANT
TRANSDUCER W/STOPCOCK (MISCELLANEOUS) ×2 IMPLANT
TUBING CIL FLEX 10 FLL-RA (TUBING) ×2 IMPLANT
WIRE HI TORQ VERSACORE-J 145CM (WIRE) ×1 IMPLANT

## 2017-06-08 NOTE — Discharge Instructions (Signed)

## 2017-06-08 NOTE — Research (Signed)
CADFEM Informed Consent   Subject Name: Kelsey Diaz  Subject met inclusion and exclusion criteria.  The informed consent form, study requirements and expectations were reviewed with the subject and questions and concerns were addressed prior to the signing of the consent form.  The subject verbalized understanding of the trail requirements.  The subject agreed to participate in the CADFEM trial and signed the informed consent.  The informed consent was obtained prior to performance of any protocol-specific procedures for the subject.  A copy of the signed informed consent was given to the subject and a copy was placed in the subject's medical record.  [name] 06/08/2017, 9:25 AM

## 2017-06-08 NOTE — Interval H&P Note (Signed)
Cath Lab Visit (complete for each Cath Lab visit)  Clinical Evaluation Leading to the Procedure:   ACS: No.  Non-ACS:    Anginal Classification: CCS III  Anti-ischemic medical therapy: No Therapy  Non-Invasive Test Results: Intermediate-risk stress test findings: cardiac mortality 1-3%/year  Prior CABG: No previous CABG      History and Physical Interval Note:  06/08/2017 10:24 AM  Kelsey Diaz  has presented today for surgery, with the diagnosis of abn stress cp  The various methods of treatment have been discussed with the patient and family. After consideration of risks, benefits and other options for treatment, the patient has consented to  Procedure(s): LEFT HEART CATH AND CORONARY ANGIOGRAPHY (N/A) as a surgical intervention .  The patient's history has been reviewed, patient examined, no change in status, stable for surgery.  I have reviewed the patient's chart and labs.  Questions were answered to the patient's satisfaction.     Shelva Majestic

## 2017-06-09 ENCOUNTER — Encounter (HOSPITAL_COMMUNITY): Payer: Self-pay | Admitting: Cardiovascular Disease

## 2017-06-09 MED FILL — Heparin Sodium (Porcine) 2 Unit/ML in Sodium Chloride 0.9%: INTRAMUSCULAR | Qty: 1000 | Status: AC

## 2017-07-14 ENCOUNTER — Ambulatory Visit: Payer: Medicare Other | Attending: Anesthesiology | Admitting: Anesthesiology

## 2017-07-14 ENCOUNTER — Encounter: Payer: Self-pay | Admitting: Anesthesiology

## 2017-07-14 VITALS — BP 111/73 | HR 80 | Temp 98.0°F | Resp 16 | Ht 66.5 in | Wt 198.0 lb

## 2017-07-14 DIAGNOSIS — M5136 Other intervertebral disc degeneration, lumbar region: Secondary | ICD-10-CM

## 2017-07-14 DIAGNOSIS — M47817 Spondylosis without myelopathy or radiculopathy, lumbosacral region: Secondary | ICD-10-CM

## 2017-07-14 DIAGNOSIS — M5432 Sciatica, left side: Secondary | ICD-10-CM | POA: Diagnosis not present

## 2017-07-14 DIAGNOSIS — M5431 Sciatica, right side: Secondary | ICD-10-CM

## 2017-07-14 DIAGNOSIS — Z79899 Other long term (current) drug therapy: Secondary | ICD-10-CM | POA: Diagnosis not present

## 2017-07-14 DIAGNOSIS — G894 Chronic pain syndrome: Secondary | ICD-10-CM | POA: Diagnosis not present

## 2017-07-14 DIAGNOSIS — M4687 Other specified inflammatory spondylopathies, lumbosacral region: Secondary | ICD-10-CM | POA: Insufficient documentation

## 2017-07-14 DIAGNOSIS — F1190 Opioid use, unspecified, uncomplicated: Secondary | ICD-10-CM

## 2017-07-14 DIAGNOSIS — Z79891 Long term (current) use of opiate analgesic: Secondary | ICD-10-CM | POA: Insufficient documentation

## 2017-07-14 DIAGNOSIS — M79662 Pain in left lower leg: Secondary | ICD-10-CM | POA: Insufficient documentation

## 2017-07-14 DIAGNOSIS — Z791 Long term (current) use of non-steroidal anti-inflammatories (NSAID): Secondary | ICD-10-CM | POA: Insufficient documentation

## 2017-07-14 DIAGNOSIS — M25552 Pain in left hip: Secondary | ICD-10-CM

## 2017-07-14 MED ORDER — HYDROCODONE-ACETAMINOPHEN 7.5-325 MG PO TABS: 1.0000 | ORAL_TABLET | Freq: Three times a day (TID) | ORAL | 0 refills | Status: DC

## 2017-07-14 NOTE — Patient Instructions (Signed)
____________________________________________________________________________________________  General Risks and Possible Complications  Patient Responsibilities: It is important that you read this as it is part of your informed consent. It is our duty to inform you of the risks and possible complications associated with treatments offered to you. It is your responsibility as a patient to read this and to ask questions about anything that is not clear or that you believe was not covered in this document.  Patient's Rights: You have the right to refuse treatment. You also have the right to change your mind, even after initially having agreed to have the treatment done. However, under this last option, if you wait until the last second to change your mind, you may be charged for the materials used up to that point.  Introduction: Medicine is not an exact science. Everything in Medicine, including the lack of treatment(s), carries the potential for danger, harm, or loss (which is by definition: Risk). In Medicine, a complication is a secondary problem, condition, or disease that can aggravate an already existing one. All treatments carry the risk of possible complications. The fact that a side effects or complications occurs, does not imply that the treatment was conducted incorrectly. It must be clearly understood that these can happen even when everything is done following the highest safety standards.  No treatment: You can choose not to proceed with the proposed treatment alternative. The "PRO(s)" would include: avoiding the risk of complications associated with the therapy. The "CON(s)" would include: not getting any of the treatment benefits. These benefits fall under one of three categories: diagnostic; therapeutic; and/or palliative. Diagnostic benefits include: getting information which can ultimately lead to improvement of the disease or symptom(s). Therapeutic benefits are those associated with the  successful treatment of the disease. Finally, palliative benefits are those related to the decrease of the primary symptoms, without necessarily curing the condition (example: decreasing the pain from a flare-up of a chronic condition, such as incurable terminal cancer).  General Risks and Complications: These are associated to most interventional treatments. They can occur alone, or in combination. They fall under one of the following six (6) categories: no benefit or worsening of symptoms; bleeding; infection; nerve damage; allergic reactions; and/or death. 1. No benefits or worsening of symptoms: In Medicine there are no guarantees, only probabilities. No healthcare provider can ever guarantee that a medical treatment will work, they can only state the probability that it may. Furthermore, there is always the possibility that the condition may worsen, either directly, or indirectly, as a consequence of the treatment. 2. Bleeding: This is more common if the patient is taking a blood thinner, either prescription or over the counter (example: Goody Powders, Fish oil, Aspirin, Garlic, etc.), or if suffering a condition associated with impaired coagulation (example: Hemophilia, cirrhosis of the liver, low platelet counts, etc.). However, even if you do not have one on these, it can still happen. If you have any of these conditions, or take one of these drugs, make sure to notify your treating physician. 3. Infection: This is more common in patients with a compromised immune system, either due to disease (example: diabetes, cancer, human immunodeficiency virus [HIV], etc.), or due to medications or treatments (example: therapies used to treat cancer and rheumatological diseases). However, even if you do not have one on these, it can still happen. If you have any of these conditions, or take one of these drugs, make sure to notify your treating physician. 4. Nerve Damage: This is more common when the   treatment is  an invasive one, but it can also happen with the use of medications, such as those used in the treatment of cancer. The damage can occur to small secondary nerves, or to large primary ones, such as those in the spinal cord and brain. This damage may be temporary or permanent and it may lead to impairments that can range from temporary numbness to permanent paralysis and/or brain death. 5. Allergic Reactions: Any time a substance or material comes in contact with our body, there is the possibility of an allergic reaction. These can range from a mild skin rash (contact dermatitis) to a severe systemic reaction (anaphylactic reaction), which can result in death. 6. Death: In general, any medical intervention can result in death, most of the time due to an unforeseen complication. ____________________________________________________________________________________________  Epidural Steroid Injection Patient Information  Description: The epidural space surrounds the nerves as they exit the spinal cord.  In some patients, the nerves can be compressed and inflamed by a bulging disc or a tight spinal canal (spinal stenosis).  By injecting steroids into the epidural space, we can bring irritated nerves into direct contact with a potentially helpful medication.  These steroids act directly on the irritated nerves and can reduce swelling and inflammation which often leads to decreased pain.  Epidural steroids may be injected anywhere along the spine and from the neck to the low back depending upon the location of your pain.   After numbing the skin with local anesthetic (like Novocaine), a small needle is passed into the epidural space slowly.  You may experience a sensation of pressure while this is being done.  The entire block usually last less than 10 minutes.  Conditions which may be treated by epidural steroids:   Low back and leg pain  Neck and arm pain  Spinal stenosis  Post-laminectomy  syndrome  Herpes zoster (shingles) pain  Pain from compression fractures  Preparation for the injection:  1. Do not eat any solid food or dairy products within 8 hours of your appointment.  2. You may drink clear liquids up to 3 hours before appointment.  Clear liquids include water, black coffee, juice or soda.  No milk or cream please. 3. You may take your regular medication, including pain medications, with a sip of water before your appointment  Diabetics should hold regular insulin (if taken separately) and take 1/2 normal NPH dos the morning of the procedure.  Carry some sugar containing items with you to your appointment. 4. A driver must accompany you and be prepared to drive you home after your procedure.  5. Bring all your current medications with your. 6. An IV may be inserted and sedation may be given at the discretion of the physician.   7. A blood pressure cuff, EKG and other monitors will often be applied during the procedure.  Some patients may need to have extra oxygen administered for a short period. 8. You will be asked to provide medical information, including your allergies, prior to the procedure.  We must know immediately if you are taking blood thinners (like Coumadin/Warfarin)  Or if you are allergic to IV iodine contrast (dye). We must know if you could possible be pregnant.  Possible side-effects:  Bleeding from needle site  Infection (rare, may require surgery)  Nerve injury (rare)  Numbness & tingling (temporary)  Difficulty urinating (rare, temporary)  Spinal headache ( a headache worse with upright posture)  Light -headedness (temporary)  Pain at injection site (several   days)  Decreased blood pressure (temporary)  Weakness in arm/leg (temporary)  Pressure sensation in back/neck (temporary)  Call if you experience:  Fever/chills associated with headache or increased back/neck pain.  Headache worsened by an upright position.  New onset  weakness or numbness of an extremity below the injection site  Hives or difficulty breathing (go to the emergency room)  Inflammation or drainage at the infection site  Severe back/neck pain  Any new symptoms which are concerning to you  Please note:  Although the local anesthetic injected can often make your back or neck feel good for several hours after the injection, the pain will likely return.  It takes 3-7 days for steroids to work in the epidural space.  You may not notice any pain relief for at least that one week.  If effective, we will often do a series of three injections spaced 3-6 weeks apart to maximally decrease your pain.  After the initial series, we generally will wait several months before considering a repeat injection of the same type.  If you have any questions, please call [phone] [facility] 

## 2017-07-14 NOTE — Progress Notes (Signed)
Nursing Pain Medication Assessment:  Safety precautions to be maintained throughout the outpatient stay will include: orient to surroundings, keep bed in low position, maintain call bell within reach at all times, provide assistance with transfer out of bed and ambulation.  Medication Inspection Compliance: Pill count conducted under aseptic conditions, in front of the patient. Neither the pills nor the bottle was removed from the patient's sight at any time. Once count was completed pills were immediately returned to the patient in their original bottle.  Medication: Hydrocodone/APAP Pill/Patch Count: 48 of 90 pills remain Pill/Patch Appearance: Markings consistent with prescribed medication Bottle Appearance: Standard pharmacy container. Clearly labeled. Filled Date: 05 / 07 / 2019 Last Medication intake:  Today

## 2017-07-16 NOTE — Progress Notes (Signed)
Subjective:  Patient ID: Kelsey Diaz, female    DOB: [birth year]  Age: 72 y.o. MRN: [id number]  CC: Back Pain (lower bilateral )   Procedure: None  HPI [name] presents for reevaluation.  She was last seen approximate 2 months ago and had a trigger point injection at that visit.  She had some relief with her low back pain on the left side but is primarily for about 3 weeks.  Otherwise she is had recurrence of left lower leg pain with calf pain similar to what she had experienced prior to her last series of epidural steroid injections.  Most recent injections were in October November December 2018.  She describes approximately 50 to 75% relief lasting 4 to 6 weeks.  She is currently complaining of pain similar to that of the recurrent nature.  No change in lower extremity strength or function or bowel bladder function are noted at this time.  Has been doing her physical therapy stretching strengthening exercises without significant improvement and medication management has been ineffective for her.  Otherwise she is in her usual state of health at this time.  Outpatient Medications Prior to Visit  Medication Sig Dispense Refill  . albuterol (PROAIR HFA) 108 (90 BASE) MCG/ACT inhaler Inhale 2 puffs into the lungs every 6 (six) hours as needed for shortness of breath.     . Calcium Carbonate-Vitamin D (CALCIUM + D) 600-200 MG-UNIT TABS Take 1 tablet by mouth daily.     . clobetasol cream (TEMOVATE) 0.86 % Apply 1 application topically 2 (two) times daily as needed (irritation).     . DULoxetine (CYMBALTA) 60 MG capsule Take 60 mg by mouth daily.    . fluocinonide (LIDEX) 0.05 % external solution Apply 1 application topically 2 (two) times daily as needed (skin irritation).     . Fluticasone-Umeclidin-Vilant (TRELEGY ELLIPTA) 100-62.5-25 MCG/INH AEPB Inhale 1 puff into the lungs daily. 1 each 6  . LORazepam (ATIVAN) 0.5 MG tablet Take 0.5 mg by mouth every 8 (eight) hours as needed for  anxiety.     . meloxicam (MOBIC) 15 MG tablet Take 1 tablet (15 mg total) by mouth daily.    . methocarbamol (ROBAXIN) 500 MG tablet Take 1 tablet (500 mg total) by mouth 4 (four) times daily. (Patient taking differently: Take 500 mg by mouth 4 (four) times daily as needed for muscle spasms. ) 120 tablet 2  . Multiple Vitamin (MULTI-VITAMIN PO) Take 1 tablet by mouth daily.     . Omega-3 Fatty Acids (FISH OIL PO) Take 1,000 mg by mouth daily.     Marland Kitchen omeprazole (PRILOSEC) 40 MG capsule Take 1 capsule by mouth daily.  1  . Ospemifene (OSPHENA) 60 MG TABS Take 60 mg by mouth every other day.     Marland Kitchen PREMARIN vaginal cream Place 1 Applicatorful vaginally daily as needed.     . valsartan-hydrochlorothiazide (DIOVAN-HCT) 160-12.5 MG tablet Take 1 tablet by mouth daily.     Marland Kitchen HYDROcodone-acetaminophen (NORCO) 7.5-325 MG tablet Take 1 tablet by mouth 3 (three) times daily. 90 tablet 0   Facility-Administered Medications Prior to Visit  Medication Dose Route Frequency Provider Last Rate Last Dose  . betamethasone acetate-betamethasone sodium phosphate (CELESTONE) injection 3 mg  3 mg Intramuscular Once Kelsey Diaz, DPM        Review of Systems CNS: No confusion or sedation Cardiac: No angina or palpitations GI: No abdominal pain or constipation Constitutional: No nausea vomiting fevers or chills  Objective:  BP 111/73 (BP Location: Right Arm, Patient Position: Sitting, Cuff Size: Normal)   Pulse 80   Temp 98 F (36.7 C) (Oral)   Resp 16   Ht 5' 6.5" (1.689 m)   Wt 198 lb (89.8 kg)   SpO2 95%   BMI 31.48 kg/m    BP Readings from Last 3 Encounters:  07/14/17 111/73  06/08/17 121/78  06/03/17 120/68     Wt Readings from Last 3 Encounters:  07/14/17 198 lb (89.8 kg)  06/08/17 200 lb (90.7 kg)  06/03/17 202 lb 12.8 oz (92 kg)     Physical Exam Pt is alert and oriented PERRL EOMI HEART IS RRR no murmur or rub LCTA no wheezing or rales MUSCULOSKELETAL reveals some paraspinous  muscle tenderness but no overt trigger points.  She does have a recurrent positive straight leg raise left leg negative on the right.  Her muscle tone and bulk is at baseline.  Labs  No results found for: HGBA1C Lab Results  Component Value Date   CREATININE 0.69 06/03/2017    -------------------------------------------------------------------------------------------------------------------- Lab Results  Component Value Date   WBC 5.9 06/03/2017   HGB 14.2 06/03/2017   HCT 42.1 06/03/2017   PLT 244 06/03/2017   GLUCOSE 134 (H) 06/03/2017   ALT 20 06/03/2017   AST 22 06/03/2017   NA 137 06/03/2017   K 4.1 06/03/2017   CL 100 06/03/2017   CREATININE 0.69 06/03/2017   BUN 19 06/03/2017   CO2 21 06/03/2017   TSH 1.260 06/03/2017   INR 1.0 06/03/2017    --------------------------------------------------------------------------------------------------------------------- No results found.   Assessment & Plan:   Kelsey Diaz was seen today for back pain.  Diagnoses and all orders for this visit:  Chronic pain syndrome  DDD (degenerative disc disease), lumbar -     Lumbar Epidural Injection; Future  Bilateral sciatica -     Lumbar Epidural Injection; Future  Chronic, continuous use of opioids  Facet arthritis of lumbosacral region  Hip pain, acute, left  Other orders -     Discontinue: HYDROcodone-acetaminophen (NORCO) 7.5-325 MG tablet; Take 1 tablet by mouth 3 (three) times daily. -     HYDROcodone-acetaminophen (NORCO) 7.5-325 MG tablet; Take 1 tablet by mouth 3 (three) times daily.        ----------------------------------------------------------------------------------------------------------------------  Problem List Items Addressed This Visit    None    Visit Diagnoses    Chronic pain syndrome    -  Primary   DDD (degenerative disc disease), lumbar       Relevant Medications   HYDROcodone-acetaminophen (NORCO) 7.5-325 MG tablet   Other Relevant Orders    Lumbar Epidural Injection   Bilateral sciatica       Relevant Orders   Lumbar Epidural Injection   Chronic, continuous use of opioids       Facet arthritis of lumbosacral region       Relevant Medications   HYDROcodone-acetaminophen (NORCO) 7.5-325 MG tablet   Hip pain, acute, left            ----------------------------------------------------------------------------------------------------------------------  1. Chronic pain syndrome I want her to continue with her stretching strengthening exercises.  We have gone over these again.  I think her medication management at present is appropriate.  I am going to have her return to clinic in about a month for possible repeat epidural injection.  She responded favorably to these in the past.  2. DDD (degenerative disc disease), lumbar As above - Lumbar Epidural Injection; Future  3.  Bilateral sciatica As above - Lumbar Epidural Injection; Future  4. Chronic, continuous use of opioids Unfortunately her low back pain has remained quite recalcitrant.  Despite conservative therapy she is required continuous opioid management for adequate pain relief.  I have reviewed her narcotic assessment sheet and she continues to derive good functional lifestyle improvement with her medications with no side effects.  We have also reviewed the [facility] practitioner database information and it is appropriate and will therefore refill her medications for June 6 and July 6 for her Vicodin 7.5 mg tablets #90 for 3 times daily dosing.  5. Facet arthritis of lumbosacral region Continue with core stretching strengthening exercises  6. Hip pain, acute, left As above    ----------------------------------------------------------------------------------------------------------------------  I am having Beckie Busing. [place] maintain her Calcium Carbonate-Vitamin D, Multiple Vitamin (MULTI-VITAMIN PO), DULoxetine, albuterol, Ospemifene, Omega-3 Fatty Acids  (FISH OIL PO), valsartan-hydrochlorothiazide, PREMARIN, LORazepam, clobetasol cream, fluocinonide, methocarbamol, omeprazole, Fluticasone-Umeclidin-Vilant, meloxicam, and HYDROcodone-acetaminophen. We will continue to administer betamethasone acetate-betamethasone sodium phosphate.   Meds ordered this encounter  Medications  . DISCONTD: HYDROcodone-acetaminophen (NORCO) 7.5-325 MG tablet    Sig: Take 1 tablet by mouth 3 (three) times daily.    Dispense:  90 tablet    Refill:  0    Do not fill until [id number]  . HYDROcodone-acetaminophen (NORCO) 7.5-325 MG tablet    Sig: Take 1 tablet by mouth 3 (three) times daily.    Dispense:  90 tablet    Refill:  0    Do not fill until [id number]   Patient's Medications  New Prescriptions   No medications on file  Previous Medications   ALBUTEROL (PROAIR HFA) 108 (90 BASE) MCG/ACT INHALER    Inhale 2 puffs into the lungs every 6 (six) hours as needed for shortness of breath.    CALCIUM CARBONATE-VITAMIN D (CALCIUM + D) 600-200 MG-UNIT TABS    Take 1 tablet by mouth daily.    CLOBETASOL CREAM (TEMOVATE) 0.05 %    Apply 1 application topically 2 (two) times daily as needed (irritation).    DULOXETINE (CYMBALTA) 60 MG CAPSULE    Take 60 mg by mouth daily.   FLUOCINONIDE (LIDEX) 0.05 % EXTERNAL SOLUTION    Apply 1 application topically 2 (two) times daily as needed (skin irritation).    FLUTICASONE-UMECLIDIN-VILANT (TRELEGY ELLIPTA) 100-62.5-25 MCG/INH AEPB    Inhale 1 puff into the lungs daily.   LORAZEPAM (ATIVAN) 0.5 MG TABLET    Take 0.5 mg by mouth every 8 (eight) hours as needed for anxiety.    MELOXICAM (MOBIC) 15 MG TABLET    Take 1 tablet (15 mg total) by mouth daily.   METHOCARBAMOL (ROBAXIN) 500 MG TABLET    Take 1 tablet (500 mg total) by mouth 4 (four) times daily.   MULTIPLE VITAMIN (MULTI-VITAMIN PO)    Take 1 tablet by mouth daily.    OMEGA-3 FATTY ACIDS (FISH OIL PO)    Take 1,000 mg by mouth daily.    OMEPRAZOLE (PRILOSEC) 40 MG CAPSULE     Take 1 capsule by mouth daily.   OSPEMIFENE (OSPHENA) 60 MG TABS    Take 60 mg by mouth every other day.    PREMARIN VAGINAL CREAM    Place 1 Applicatorful vaginally daily as needed.    VALSARTAN-HYDROCHLOROTHIAZIDE (DIOVAN-HCT) 160-12.5 MG TABLET    Take 1 tablet by mouth daily.   Modified Medications   Modified Medication Previous Medication   HYDROCODONE-ACETAMINOPHEN (NORCO) 7.5-325 MG TABLET HYDROcodone-acetaminophen (NORCO) 7.5-325 MG  tablet      Take 1 tablet by mouth 3 (three) times daily.    Take 1 tablet by mouth 3 (three) times daily.  Discontinued Medications   No medications on file   ----------------------------------------------------------------------------------------------------------------------  Follow-up: Return in about 1 month (around 08/14/2017) for evaluation, procedure.    Molli Barrows, MD

## 2017-07-23 ENCOUNTER — Ambulatory Visit: Payer: Medicare Other | Attending: Anesthesiology | Admitting: Anesthesiology

## 2017-07-23 ENCOUNTER — Ambulatory Visit
Admission: RE | Admit: 2017-07-23 | Discharge: 2017-07-23 | Disposition: A | Discharge status: Home / Self Care | Payer: Medicare Other | Source: Ambulatory Visit | Attending: Anesthesiology | Admitting: Anesthesiology

## 2017-07-23 ENCOUNTER — Other Ambulatory Visit: Payer: Self-pay

## 2017-07-23 ENCOUNTER — Other Ambulatory Visit: Payer: Self-pay | Admitting: Anesthesiology

## 2017-07-23 ENCOUNTER — Encounter: Payer: Self-pay | Admitting: Anesthesiology

## 2017-07-23 VITALS — BP 126/91 | HR 80 | Temp 98.1°F | Resp 16 | Ht 66.0 in | Wt 198.0 lb

## 2017-07-23 DIAGNOSIS — M5106 Intervertebral disc disorders with myelopathy, lumbar region: Secondary | ICD-10-CM | POA: Diagnosis not present

## 2017-07-23 DIAGNOSIS — M5431 Sciatica, right side: Secondary | ICD-10-CM

## 2017-07-23 DIAGNOSIS — M5136 Other intervertebral disc degeneration, lumbar region: Secondary | ICD-10-CM

## 2017-07-23 DIAGNOSIS — M5432 Sciatica, left side: Secondary | ICD-10-CM | POA: Diagnosis not present

## 2017-07-23 DIAGNOSIS — M545 Low back pain: Secondary | ICD-10-CM | POA: Diagnosis present

## 2017-07-23 DIAGNOSIS — M4716 Other spondylosis with myelopathy, lumbar region: Secondary | ICD-10-CM

## 2017-07-23 DIAGNOSIS — Z79899 Other long term (current) drug therapy: Secondary | ICD-10-CM | POA: Diagnosis not present

## 2017-07-23 DIAGNOSIS — R52 Pain, unspecified: Secondary | ICD-10-CM

## 2017-07-23 MED ORDER — SODIUM CHLORIDE 0.9 % IJ SOLN
INTRAMUSCULAR | Status: AC
  Filled 2017-07-23: qty 10

## 2017-07-23 MED ORDER — LIDOCAINE HCL (PF) 1 % IJ SOLN
INTRAMUSCULAR | Status: AC
  Filled 2017-07-23: qty 5

## 2017-07-23 MED ORDER — TRIAMCINOLONE ACETONIDE 40 MG/ML IJ SUSP
INTRAMUSCULAR | Status: AC
  Filled 2017-07-23: qty 1

## 2017-07-23 MED ORDER — IOPAMIDOL (ISOVUE-M 200) INJECTION 41%
INTRAMUSCULAR | Status: AC
  Filled 2017-07-23: qty 10

## 2017-07-23 MED ORDER — ROPIVACAINE HCL 2 MG/ML IJ SOLN
10.0000 mL | Freq: Once | INTRAMUSCULAR | Status: AC
  Administered 2017-07-23: 10 mL via EPIDURAL

## 2017-07-23 MED ORDER — LIDOCAINE HCL (PF) 1 % IJ SOLN
5.0000 mL | Freq: Once | INTRAMUSCULAR | Status: AC
  Administered 2017-07-23: 5 mL via SUBCUTANEOUS

## 2017-07-23 MED ORDER — TRIAMCINOLONE ACETONIDE 40 MG/ML IJ SUSP
40.0000 mg | Freq: Once | INTRAMUSCULAR | Status: AC
  Administered 2017-07-23: 40 mg

## 2017-07-23 MED ORDER — IOPAMIDOL (ISOVUE-M 200) INJECTION 41%
20.0000 mL | Freq: Once | INTRAMUSCULAR | Status: DC | PRN
  Administered 2017-07-23: 20 mL
  Filled 2017-07-23: qty 20

## 2017-07-23 MED ORDER — ROPIVACAINE HCL 2 MG/ML IJ SOLN
INTRAMUSCULAR | Status: AC
  Filled 2017-07-23: qty 10

## 2017-07-23 MED ORDER — SODIUM CHLORIDE 0.9% FLUSH
10.0000 mL | Freq: Once | INTRAVENOUS | Status: AC
  Administered 2017-07-23: 10 mL

## 2017-07-23 NOTE — Progress Notes (Signed)
Subjective:  Patient ID: Kelsey Diaz, female    DOB: Jul 16, 1945  Age: 72 y.o. MRN: [id number]  CC: Back Pain (low)   Procedure: L4 5 epidural steroid under fluoroscopic guidance without sedation  HPI SHAKA ZECH presents for reevaluation.  She is continued to have right anterior lower leg pain and posterior calf pain.  The quality characteristic distribution is as previously documented.  She occasionally has some give way weakness in both legs and occasional pain down both legs as well however the right leg pain appears more intense than the left in general.  She is also taking her medications as prescribed taking hydrocodone 3 times a day and tolerating this well.  Based on her narcotic assessment sheet she seems to be continued to derive good functional lifestyle improvement and better sleep with the medications.  She has had previous epidurals in the past and is responded favorably with approximately 75% relief lasting generally 2 to 3 months.  Her last set of injections was in October to November December 2018.  He is continue to try and do her stretching strengthening exercises as tolerated.  Outpatient Medications Prior to Visit  Medication Sig Dispense Refill  . albuterol (PROAIR HFA) 108 (90 BASE) MCG/ACT inhaler Inhale 2 puffs into the lungs every 6 (six) hours as needed for shortness of breath.     . Calcium Carbonate-Vitamin D (CALCIUM + D) 600-200 MG-UNIT TABS Take 1 tablet by mouth daily.     . clobetasol cream (TEMOVATE) 7.37 % Apply 1 application topically 2 (two) times daily as needed (irritation).     . DULoxetine (CYMBALTA) 60 MG capsule Take 60 mg by mouth daily.    . fluocinonide (LIDEX) 0.05 % external solution Apply 1 application topically 2 (two) times daily as needed (skin irritation).     . Fluticasone-Umeclidin-Vilant (TRELEGY ELLIPTA) 100-62.5-25 MCG/INH AEPB Inhale 1 puff into the lungs daily. 1 each 6  . HYDROcodone-acetaminophen (NORCO) 7.5-325 MG tablet  Take 1 tablet by mouth 3 (three) times daily. 90 tablet 0  . LORazepam (ATIVAN) 0.5 MG tablet Take 0.5 mg by mouth every 8 (eight) hours as needed for anxiety.     . meloxicam (MOBIC) 15 MG tablet Take 1 tablet (15 mg total) by mouth daily.    . methocarbamol (ROBAXIN) 500 MG tablet Take 1 tablet (500 mg total) by mouth 4 (four) times daily. (Patient taking differently: Take 500 mg by mouth 4 (four) times daily as needed for muscle spasms. ) 120 tablet 2  . Multiple Vitamin (MULTI-VITAMIN PO) Take 1 tablet by mouth daily.     . Omega-3 Fatty Acids (FISH OIL PO) Take 1,000 mg by mouth daily.     Marland Kitchen omeprazole (PRILOSEC) 40 MG capsule Take 1 capsule by mouth daily.  1  . Ospemifene (OSPHENA) 60 MG TABS Take 60 mg by mouth every other day.     Marland Kitchen PREMARIN vaginal cream Place 1 Applicatorful vaginally daily as needed.     . valsartan-hydrochlorothiazide (DIOVAN-HCT) 160-12.5 MG tablet Take 1 tablet by mouth daily.      Facility-Administered Medications Prior to Visit  Medication Dose Route Frequency Provider Last Rate Last Dose  . betamethasone acetate-betamethasone sodium phosphate (CELESTONE) injection 3 mg  3 mg Intramuscular Once Edrick Kins, DPM        Review of Systems CNS: No confusion or sedation Cardiac: No angina or palpitations GI: No abdominal pain or constipation Constitutional: No nausea vomiting fevers or chills  Objective:  BP (!) 131/94   Pulse 79   Temp 98.1 F (36.7 C)   Resp 14   Ht 5\' 6"  (1.676 m)   Wt 198 lb (89.8 kg)   SpO2 93%   BMI 31.96 kg/m    BP Readings from Last 3 Encounters:  07/23/17 (!) 131/94  07/14/17 111/73  06/08/17 121/78     Wt Readings from Last 3 Encounters:  07/23/17 198 lb (89.8 kg)  07/14/17 198 lb (89.8 kg)  06/08/17 200 lb (90.7 kg)     Physical Exam Pt is alert and oriented PERRL EOMI HEART IS RRR no murmur or rub LCTA no wheezing or rales MUSCULOSKELETAL reveals a positive straight leg raise on the right side negative  on the left with preservation of good muscle tone and bulk.  Labs  No results found for: HGBA1C Lab Results  Component Value Date   CREATININE 0.69 06/03/2017    -------------------------------------------------------------------------------------------------------------------- Lab Results  Component Value Date   WBC 5.9 06/03/2017   HGB 14.2 06/03/2017   HCT 42.1 06/03/2017   PLT 244 06/03/2017   GLUCOSE 134 (H) 06/03/2017   ALT 20 06/03/2017   AST 22 06/03/2017   NA 137 06/03/2017   K 4.1 06/03/2017   CL 100 06/03/2017   CREATININE 0.69 06/03/2017   BUN 19 06/03/2017   CO2 21 06/03/2017   TSH 1.260 06/03/2017   INR 1.0 06/03/2017    --------------------------------------------------------------------------------------------------------------------- No results found.   Assessment & Plan:   Alessandria was seen today for back pain.  Diagnoses and all orders for this visit:  Lumbar spondylosis with myelopathy  DDD (degenerative disc disease), lumbar -     Lumbar Epidural Injection  Bilateral sciatica -     Lumbar Epidural Injection  Other orders -     triamcinolone acetonide (KENALOG-40) injection 40 mg -     sodium chloride flush (NS) 0.9 % injection 10 mL -     ropivacaine (PF) 2 mg/mL (0.2%) (NAROPIN) injection 10 mL -     lidocaine (PF) (XYLOCAINE) 1 % injection 5 mL -     iopamidol (ISOVUE-M) 41 % intrathecal injection 20 mL        ----------------------------------------------------------------------------------------------------------------------  Problem List Items Addressed This Visit    None    Visit Diagnoses    Lumbar spondylosis with myelopathy    -  Primary   DDD (degenerative disc disease), lumbar       Relevant Medications   triamcinolone acetonide (KENALOG-40) injection 40 mg (Completed)   Bilateral sciatica             ----------------------------------------------------------------------------------------------------------------------  1. DDD (degenerative disc disease), lumbar We will proceed with an L4-5 epidural steroid to see if this will give better coverage for the anterior leg pain.  We have gone over the risks and benefits of the procedure with return to clinic planned in approximately 1 month for reevaluation possible repeat injection at that time.  I have encouraged her to continue with her stretching strengthening exercise program. - Lumbar Epidural Injection  2. Bilateral sciatica As above - Lumbar Epidural Injection  3. Lumbar spondylosis with myelopathy As above.  Based on the chronicity pain we will continue her on her current regimen with hydrocodone.  She has existing prescriptions for June 6 and July 6 of this year.    ----------------------------------------------------------------------------------------------------------------------  I am having Beckie Busing. Stawicki maintain her Calcium Carbonate-Vitamin D, Multiple Vitamin (MULTI-VITAMIN PO), DULoxetine, albuterol, Ospemifene, Omega-3 Fatty Acids (FISH OIL PO), valsartan-hydrochlorothiazide, PREMARIN,  LORazepam, clobetasol cream, fluocinonide, methocarbamol, omeprazole, Fluticasone-Umeclidin-Vilant, meloxicam, and HYDROcodone-acetaminophen. We administered triamcinolone acetonide, sodium chloride flush, ropivacaine (PF) 2 mg/mL (0.2%), lidocaine (PF), and iopamidol. We will continue to administer betamethasone acetate-betamethasone sodium phosphate.   Meds ordered this encounter  Medications  . triamcinolone acetonide (KENALOG-40) injection 40 mg  . sodium chloride flush (NS) 0.9 % injection 10 mL  . ropivacaine (PF) 2 mg/mL (0.2%) (NAROPIN) injection 10 mL  . lidocaine (PF) (XYLOCAINE) 1 % injection 5 mL  . iopamidol (ISOVUE-M) 41 % intrathecal injection 20 mL   Patient's Medications  New Prescriptions   No medications  on file  Previous Medications   ALBUTEROL (PROAIR HFA) 108 (90 BASE) MCG/ACT INHALER    Inhale 2 puffs into the lungs every 6 (six) hours as needed for shortness of breath.    CALCIUM CARBONATE-VITAMIN D (CALCIUM + D) 600-200 MG-UNIT TABS    Take 1 tablet by mouth daily.    CLOBETASOL CREAM (TEMOVATE) 0.05 %    Apply 1 application topically 2 (two) times daily as needed (irritation).    DULOXETINE (CYMBALTA) 60 MG CAPSULE    Take 60 mg by mouth daily.   FLUOCINONIDE (LIDEX) 0.05 % EXTERNAL SOLUTION    Apply 1 application topically 2 (two) times daily as needed (skin irritation).    FLUTICASONE-UMECLIDIN-VILANT (TRELEGY ELLIPTA) 100-62.5-25 MCG/INH AEPB    Inhale 1 puff into the lungs daily.   HYDROCODONE-ACETAMINOPHEN (NORCO) 7.5-325 MG TABLET    Take 1 tablet by mouth 3 (three) times daily.   LORAZEPAM (ATIVAN) 0.5 MG TABLET    Take 0.5 mg by mouth every 8 (eight) hours as needed for anxiety.    MELOXICAM (MOBIC) 15 MG TABLET    Take 1 tablet (15 mg total) by mouth daily.   METHOCARBAMOL (ROBAXIN) 500 MG TABLET    Take 1 tablet (500 mg total) by mouth 4 (four) times daily.   MULTIPLE VITAMIN (MULTI-VITAMIN PO)    Take 1 tablet by mouth daily.    OMEGA-3 FATTY ACIDS (FISH OIL PO)    Take 1,000 mg by mouth daily.    OMEPRAZOLE (PRILOSEC) 40 MG CAPSULE    Take 1 capsule by mouth daily.   OSPEMIFENE (OSPHENA) 60 MG TABS    Take 60 mg by mouth every other day.    PREMARIN VAGINAL CREAM    Place 1 Applicatorful vaginally daily as needed.    VALSARTAN-HYDROCHLOROTHIAZIDE (DIOVAN-HCT) 160-12.5 MG TABLET    Take 1 tablet by mouth daily.   Modified Medications   No medications on file  Discontinued Medications   No medications on file   ----------------------------------------------------------------------------------------------------------------------  Follow-up: Return in about 1 month (around 08/22/2017) for evaluation, procedure.  Procedure: L4-L5 epidural steroid under fluoroscopic guidance  without sedation   Procedure: 4 5 LESI with fluoroscopic guidance and moderate sedation  NOTE: The risks, benefits, and expectations of the procedure have been discussed and explained to the patient who was understanding and in agreement with suggested treatment plan. No guarantees were made.  DESCRIPTION OF PROCEDURE: Lumbar epidural steroid injection with no IV Versed, EKG, blood pressure, pulse, and pulse oximetry monitoring. The procedure was performed with the patient in the prone position under fluoroscopic guidance.  Sterile prep x3 was initiated and I then injected subcutaneous lidocaine to the overlying 4 5 site after its fluoroscopic identifictation.  Using strict aseptic technique, I then advanced an 18-gauge Tuohy epidural needle in the midline using interlaminar approach via loss-of-resistance to saline technique. There was negative aspiration for  heme or  CSF.  I then confirmed position with both AP and Lateral fluoroscan.  2 cc of Isovue were injected and a  total of 5 mL of Preservative-Free normal saline mixed with 40 mg of Kenalog and 1cc Ropicaine 0.2 percent were injected incrementally via the  epidurally placed needle. The needle was removed. The patient tolerated the injection well and was convalesced and discharged to home in stable condition. Should the patient have any post procedure difficulty they have been instructed on how to contact us for assistance.    Molli Barrows, MD

## 2017-07-23 NOTE — Progress Notes (Signed)
Safety precautions to be maintained throughout the outpatient stay will include: orient to surroundings, keep bed in low position, maintain call bell within reach at all times, provide assistance with transfer out of bed and ambulation.  

## 2017-07-23 NOTE — Patient Instructions (Signed)
Pain Management Discharge Instructions  General Discharge Instructions :  If you need to reach your doctor call: Monday-Friday 8:00 am - 4:00 pm at [phone] or toll free [phone].  After clinic hours [phone] to have operator reach doctor.  Bring all of your medication bottles to all your appointments in the pain clinic.  To cancel or reschedule your appointment with Pain Management please remember to call 24 hours in advance to avoid a fee.  Refer to the educational materials which you have been given on: General Risks, I had my Procedure. Discharge Instructions, Post Sedation.  Post Procedure Instructions:  The drugs you were given will stay in your system until tomorrow, so for the next 24 hours you should not drive, make any legal decisions or drink any alcoholic beverages.  You may eat anything you prefer, but it is better to start with liquids then soups and crackers, and gradually work up to solid foods.  Please notify your doctor immediately if you have any unusual bleeding, trouble breathing or pain that is not related to your normal pain.  Depending on the type of procedure that was done, some parts of your body may feel week and/or numb.  This usually clears up by tonight or the next day.  Walk with the use of an assistive device or accompanied by an adult for the 24 hours.  You may use ice on the affected area for the first 24 hours.  Put ice in a Ziploc bag and cover with a towel and place against area 15 minutes on 15 minutes off.  You may switch to heat after 24 hours.Epidural Steroid Injection An epidural steroid injection is a shot of steroid medicine and numbing medicine that is given into the space between the spinal cord and the bones in your back (epidural space). The shot helps relieve pain caused by an irritated or swollen nerve root. The amount of pain relief you get from the injection depends on what is causing the nerve to be swollen and irritated,  and how long your pain lasts. You are more likely to benefit from this injection if your pain is strong and comes on suddenly rather than if you have had pain for a long time. Tell a health care provider about:  Any allergies you have.  All medicines you are taking, including vitamins, herbs, eye drops, creams, and over-the-counter medicines.  Any problems you or family members have had with anesthetic medicines.  Any blood disorders you have.  Any surgeries you have had.  Any medical conditions you have.  Whether you are pregnant or may be pregnant. What are the risks? Generally, this is a safe procedure. However, problems may occur, including:  Headache.  Bleeding.  Infection.  Allergic reaction to medicines.  Damage to your nerves.  What happens before the procedure? Staying hydrated Follow instructions from your health care provider about hydration, which may include:  Up to 2 hours before the procedure - you may continue to drink clear liquids, such as water, clear fruit juice, black coffee, and plain tea.  Eating and drinking restrictions Follow instructions from your health care provider about eating and drinking, which may include:  8 hours before the procedure - stop eating heavy meals or foods such as meat, fried foods, or fatty foods.  6 hours before the procedure - stop eating light meals or foods, such as toast or cereal.  6 hours before the procedure - stop drinking milk or drinks that contain milk.    2 hours before the procedure - stop drinking clear liquids.  Medicine  You may be given medicines to lower anxiety.  Ask your health care provider about: ? Changing or stopping your regular medicines. This is especially important if you are taking diabetes medicines or blood thinners. ? Taking medicines such as aspirin and ibuprofen. These medicines can thin your blood. Do not take these medicines before your procedure if your health care provider  instructs you not to. General instructions  Plan to have someone take you home from the hospital or clinic. What happens during the procedure?  You may receive a medicine to help you relax (sedative).  You will be asked to lie on your abdomen.  The injection site will be cleaned.  A numbing medicine (local anesthetic) will be used to numb the injection site.  A needle will be inserted through your skin into the epidural space. You may feel some discomfort when this happens. An X-ray machine will be used to make sure the needle is put as close as possible to the affected nerve.  A steroid medicine and a local anesthetic will be injected into the epidural space.  The needle will be removed.  A bandage (dressing) will be put over the injection site. What happens after the procedure?  Your blood pressure, heart rate, breathing rate, and blood oxygen level will be monitored until the medicines you were given have worn off.  Your arm or leg may feel weak or numb for a few hours.  The injection site may feel sore.  Do not drive for 24 hours if you received a sedative. This information is not intended to replace advice given to you by your health care provider. Make sure you discuss any questions you have with your health care provider. Document Released: 05/19/2007 Document Revised: 07/24/2015 Document Reviewed: 05/28/2015 [employer] Interactive Patient Education  2018 [employer]  

## 2017-07-26 ENCOUNTER — Telehealth: Payer: Self-pay | Admitting: *Deleted

## 2017-07-26 NOTE — Telephone Encounter (Signed)
No problems post procedure. 

## 2017-08-09 ENCOUNTER — Ambulatory Visit: Payer: Medicare Other | Admitting: Pulmonary Disease

## 2017-08-10 ENCOUNTER — Ambulatory Visit: Payer: Medicare Other | Admitting: Pulmonary Disease

## 2017-08-10 ENCOUNTER — Encounter: Payer: Self-pay | Admitting: Pulmonary Disease

## 2017-08-10 VITALS — BP 126/78 | HR 84 | Ht 66.5 in | Wt 198.0 lb

## 2017-08-10 DIAGNOSIS — J449 Chronic obstructive pulmonary disease, unspecified: Secondary | ICD-10-CM | POA: Diagnosis not present

## 2017-08-10 DIAGNOSIS — G4733 Obstructive sleep apnea (adult) (pediatric): Secondary | ICD-10-CM

## 2017-08-10 MED ORDER — FLUTICASONE-UMECLIDIN-VILANT 100-62.5-25 MCG/INH IN AEPB: 1.0000 | INHALATION_SPRAY | Freq: Every day | RESPIRATORY_TRACT | 6 refills | Status: DC

## 2017-08-10 NOTE — Progress Notes (Signed)
Kelsey Diaz    [id number]    08-22-1945  Primary Care Physician:White, Caren Griffins, MD  Referring Physician: Harlan Stains, MD [place], [place] [address]  Chief complaint: Follow-up for COPD, asthma, sleep apnea  HPI: 72 year old with history of hypertension, COPD, fibromyalgia, GERD, hypothyroidism, sleep apnea,.  She has previously seen Dr. Halford Chessman at the pulmonary clinic in 2014.  Referred back for management of COPD, asthma She reports worsening dyspnea on exertion or the past 6 months associated with chest tightness, wheezing.  She is just on albuterol inhaler and has been intolerant ofFollow-up Advair, Pulmicort and Spiriva in the past.  She denies any nighttime awakening.  No sputum production, fevers, chills, hemoptysis.   History noted for significant GERD, hiatal hernia, esophageal strictures.  She follows with Dr. Teena Irani, GI heartburn. Reports worsening heartburn, indigestion over the past few months.  She has history of sleep apnea and was previously on CPAP.  She stopped using it many years ago as she was intolerant of the mask and does not want to try again.  Pets: No pets Occupation: Retired Secondary school teacher at [employer] Exposures: No mold, hot tubs.  No other significant exposure. Smoking history: 80-pack-year smoking history.  Quit in 2001 Travel History: Not significant.  Lived in [place] all her life.  Interim history: Breo changed to Trelegy at last visit.  She feels that this has made a big difference to her breathing.  She hardly needs to use her rescue inhaler She underwent EGD by [facility] and was told she had acid reflux.  She is currently on PPI.  Outpatient Encounter Medications as of 08/10/2017  Medication Sig  . albuterol (PROAIR HFA) 108 (90 BASE) MCG/ACT inhaler Inhale 2 puffs into the lungs every 6 (six) hours as needed for shortness of breath.   . Calcium Carbonate-Vitamin D (CALCIUM + D) 600-200 MG-UNIT  TABS Take 1 tablet by mouth daily.   . clobetasol cream (TEMOVATE) 7.41 % Apply 1 application topically 2 (two) times daily as needed (irritation).   . DULoxetine (CYMBALTA) 60 MG capsule Take 60 mg by mouth daily.  . fluocinonide (LIDEX) 0.05 % external solution Apply 1 application topically 2 (two) times daily as needed (skin irritation).   . Fluticasone-Umeclidin-Vilant (TRELEGY ELLIPTA) 100-62.5-25 MCG/INH AEPB Inhale 1 puff into the lungs daily.  Marland Kitchen HYDROcodone-acetaminophen (NORCO) 7.5-325 MG tablet Take 1 tablet by mouth 3 (three) times daily.  Marland Kitchen LORazepam (ATIVAN) 0.5 MG tablet Take 0.5 mg by mouth every 8 (eight) hours as needed for anxiety.   . meloxicam (MOBIC) 15 MG tablet Take 1 tablet (15 mg total) by mouth daily.  . methocarbamol (ROBAXIN) 500 MG tablet Take 1 tablet (500 mg total) by mouth 4 (four) times daily. (Patient taking differently: Take 500 mg by mouth 4 (four) times daily as needed for muscle spasms. )  . Multiple Vitamin (MULTI-VITAMIN PO) Take 1 tablet by mouth daily.   . Omega-3 Fatty Acids (FISH OIL PO) Take 1,000 mg by mouth daily.   Marland Kitchen omeprazole (PRILOSEC) 40 MG capsule Take 1 capsule by mouth daily.  . Ospemifene (OSPHENA) 60 MG TABS Take 60 mg by mouth every other day.   Marland Kitchen PREMARIN vaginal cream Place 1 Applicatorful vaginally daily as needed.   . valsartan-hydrochlorothiazide (DIOVAN-HCT) 160-12.5 MG tablet Take 1 tablet by mouth daily.    Facility-Administered Encounter Medications as of 08/10/2017  Medication  . betamethasone acetate-betamethasone sodium phosphate (  CELESTONE) injection 3 mg    Allergies as of 08/10/2017 - Review Complete 08/10/2017  Allergen Reaction Noted  . Morphine Other (See Comments) 10/17/2009  . Advair diskus [fluticasone-salmeterol]  09/07/2011  . Ambien [zolpidem tartrate]  09/07/2011  . Amlodipine besylate  09/07/2011  . Bextra [valdecoxib]  09/07/2011  . Captopril  09/07/2011  . Latex Itching 01/19/2017  . Lisinopril   09/07/2011  . Oxycodone hcl  10/17/2009  . Pulmicort [budesonide]  09/07/2011  . Savella [milnacipran hcl]  09/07/2011  . Spiriva [tiotropium bromide monohydrate]  09/07/2011    Past Medical History:  Diagnosis Date  . Allergic rhinitis   . Anxiety   . Asthma   . Colitis   . COPD (chronic obstructive pulmonary disease) ([place])   . Fibromyalgia   . GERD (gastroesophageal reflux disease)    With esophageal strictures  . Hypertension   . Hyperthyroidism   . OSA (obstructive sleep apnea) 02/09/2012   No mask.  Did not tolerate  . Osteoarthritis     Past Surgical History:  Procedure Laterality Date  . BREAST BIOPSY     x2  . CHOLECYSTECTOMY     2005  . KNEE SURGERY Right 2013   two torn ligaments repaired.   Marland Kitchen LEFT HEART CATH AND CORONARY ANGIOGRAPHY N/A 06/08/2017   Procedure: LEFT HEART CATH AND CORONARY ANGIOGRAPHY;  Surgeon: Troy Sine, MD;  Location: [facility];  Service: Cardiovascular;  Laterality: N/A;  . NASAL SINUS SURGERY    . VAGINAL HYSTERECTOMY      Family History  Problem Relation Age of Onset  . Hypertension Mother   . Diabetes Mother   . Allergies Mother   . Heart attack Father 79  . Cancer Father        kidney  . Emphysema Father   . Heart disease Father        CHF age 6  . Heart attack Brother 61       Died of MI age 65    Social History   Socioeconomic History  . Marital status: Married    Spouse name: Not on file  . Number of children: 3  . Years of education: Not on file  . Highest education level: Not on file  Occupational History  . Occupation: Retired    Fish farm manager: [employer]  Social Needs  . Financial resource strain: Not on file  . Food insecurity:    Worry: Not on file    Inability: Not on file  . Transportation needs:    Medical: Not on file    Non-medical: Not on file  Tobacco Use  . Smoking status: Former Smoker    Packs/day: 2.00    Years: 40.00    Pack years: 80.00    Types: Cigarettes    Last attempt to  quit: 02/24/1999    Years since quitting: 18.4  . Smokeless tobacco: Never Used  Substance and Sexual Activity  . Alcohol use: Yes    Comment: occassioal  . Drug use: No  . Sexual activity: Not on file  Lifestyle  . Physical activity:    Days per week: Not on file    Minutes per session: Not on file  . Stress: Not on file  Relationships  . Social connections:    Talks on phone: Not on file    Gets together: Not on file    Attends religious service: Not on file    Active member of club or organization: Not on file  Attends meetings of clubs or organizations: Not on file    Relationship status: Not on file  . Intimate partner violence:    Fear of current or ex partner: Not on file    Emotionally abused: Not on file    Physically abused: Not on file    Forced sexual activity: Not on file  Other Topics Concern  . Not on file  Social History Narrative   Lives with husband.  5 children together. (She has 3).     Review of systems: Review of Systems  Constitutional: Negative for fever and chills.  HENT: Negative.   Eyes: Negative for blurred vision.  Respiratory: as per HPI  Cardiovascular: Negative for chest pain and palpitations.  Gastrointestinal: Negative for vomiting, diarrhea, blood per rectum. Genitourinary: Negative for dysuria, urgency, frequency and hematuria.  Musculoskeletal: Negative for myalgias, back pain and joint pain.  Skin: Negative for itching and rash.  Neurological: Negative for dizziness, tremors, focal weakness, seizures and loss of consciousness.  Endo/Heme/Allergies: Negative for environmental allergies.  Psychiatric/Behavioral: Negative for depression, suicidal ideas and hallucinations.  All other systems reviewed and are negative.  Physical Exam: Blood pressure 126/78, pulse 84, height 5' 6.5" (1.689 m), weight 198 lb (89.8 kg), SpO2 94 %. Gen:      No acute distress HEENT:  EOMI, sclera anicteric Neck:     No masses; no thyromegaly Lungs:     Clear to auscultation bilaterally; normal respiratory effort CV:         Regular rate and rhythm; no murmurs Abd:      + bowel sounds; soft, non-tender; no palpable masses, no distension Ext:    No edema; adequate peripheral perfusion Skin:      Warm and dry; no rash Neuro: alert and oriented x 3 Psych: normal mood and affect  Data Reviewed: PFTs  10/09/11 FVC 3.51 [104%], FEV1 2.20 [89%], F/F 63, TLC 92%, DLCO 52% Mild obstructive airway disease.  Moderate decrease in diffusion capacity.  No bronchodilator response.  05/04/17 FVC 3.20 [101%], FEV1 2.23 [97%], F/F 73, TLC 108%, DLCO 53% Minimal obstructive airway disease, moderate decrease in diffusion capacity.  No bronchodilator response.  Labs CBC 03/25/17-WBC 6.9, eos 3.8%, absolute eosinophil count 300 Allergy profile 03/25/17-IgE 52, RAST panel negative Alpha-1 antitrypsin 03/25/17-193, PIMM  FENO 03/25/17-37 FENO 05/04/17-34  Imaging Chest x-ray 03/18/17-mild apical thickening.  No acute cardiopulmonary process.  CT abdomen pelvis 12/01/14- visualized lung bases are clear. I have reviewed the images personally.  Assessment:  COPD, moderate persistent asthma  PFTs reviewed. Although she does not have obstruction by F/F criteria at this curvature to the loop suggestive of minimal obstructive airway disease. Reduction in diffusion capacity noted.  There is no evidence of interstitial lung disease.  Has responded well to Trelegy and will continue the same. Suspect that her acid reflux is playing a big role in her symptoms.  GERD Follows with [facility].  Continues on PPI.  Obstructive sleep apnea Noncompliant with CPAP.  She is not interested in retrying CPAP therapy.  Health maintenance 11/02/2016- Influenza  03/16/2013-Prevnar 03/01/2017-Pneumovax  Plan/Recommendations: - Continue trelegy  Marshell Garfinkel MD [facility] 08/10/2017, 2:01 PM  CC: Harlan Stains, MD

## 2017-08-10 NOTE — Addendum Note (Signed)
Addended by: Maryanna Shape A on: 08/10/2017 02:32 PM   Modules accepted: Orders

## 2017-08-10 NOTE — Patient Instructions (Signed)
I am glad that your breathing is doing better We will continue on trelegy inhaler as it seems to be working for you.  Will make sure that you have enough refills the inhaler I will see you back in clinic in 6 months.

## 2017-08-10 NOTE — Progress Notes (Signed)
Inhaler training given. In-check peak flow #:70 

## 2017-09-06 ENCOUNTER — Ambulatory Visit: Payer: Medicare Other | Admitting: Anesthesiology

## 2017-09-08 ENCOUNTER — Encounter (HOSPITAL_COMMUNITY): Payer: Self-pay | Admitting: Emergency Medicine

## 2017-09-08 ENCOUNTER — Emergency Department (HOSPITAL_COMMUNITY): Payer: Medicare Other

## 2017-09-08 ENCOUNTER — Emergency Department (HOSPITAL_COMMUNITY)
Admission: EM | Admit: 2017-09-08 | Discharge: 2017-09-09 | Disposition: A | Discharge status: Home / Self Care | Payer: Medicare Other | Attending: Emergency Medicine | Admitting: Emergency Medicine

## 2017-09-08 ENCOUNTER — Other Ambulatory Visit: Payer: Self-pay

## 2017-09-08 DIAGNOSIS — E0590 Thyrotoxicosis, unspecified without thyrotoxic crisis or storm: Secondary | ICD-10-CM | POA: Insufficient documentation

## 2017-09-08 DIAGNOSIS — E785 Hyperlipidemia, unspecified: Secondary | ICD-10-CM | POA: Insufficient documentation

## 2017-09-08 DIAGNOSIS — J189 Pneumonia, unspecified organism: Secondary | ICD-10-CM | POA: Diagnosis not present

## 2017-09-08 DIAGNOSIS — I10 Essential (primary) hypertension: Secondary | ICD-10-CM | POA: Diagnosis not present

## 2017-09-08 DIAGNOSIS — R079 Chest pain, unspecified: Secondary | ICD-10-CM | POA: Diagnosis present

## 2017-09-08 DIAGNOSIS — Z87891 Personal history of nicotine dependence: Secondary | ICD-10-CM | POA: Diagnosis not present

## 2017-09-08 DIAGNOSIS — J449 Chronic obstructive pulmonary disease, unspecified: Secondary | ICD-10-CM | POA: Diagnosis not present

## 2017-09-08 DIAGNOSIS — Z79899 Other long term (current) drug therapy: Secondary | ICD-10-CM | POA: Insufficient documentation

## 2017-09-08 NOTE — ED Notes (Signed)
Patient transported to X-ray 

## 2017-09-08 NOTE — ED Triage Notes (Signed)
Pt BIB [facility] for chest pain that has been going on for 3 days. EMS reports chest pain is on the right side and radiates to the right shoulder and right side of her neck.  EMS reports the pt has been self treating this chest pain with muscle relaxers and hydrocodone. EMS reports this pain to be intermittent. EMS IV as documented. EMS reports giving 3 SL nitro with no relief and 324mg  of Baby ASA. EMS reports unremarkable 12 lead.   Pt reports she has COPD and thought the pain was from coughing. Pt reports that she has been using hydrocodone and muscle relaxers which have been helping her. Pt reports today she could not get the pain under control.

## 2017-09-09 LAB — CBC
HEMATOCRIT: 35.3 % — AB (ref 36.0–46.0)
Hemoglobin: 11.8 g/dL — ABNORMAL LOW (ref 12.0–15.0)
MCH: 29.6 pg (ref 26.0–34.0)
MCHC: 33.4 g/dL (ref 30.0–36.0)
MCV: 88.7 fL (ref 78.0–100.0)
Platelets: 189 10*3/uL (ref 150–400)
RBC: 3.98 MIL/uL (ref 3.87–5.11)
RDW: 12.9 % (ref 11.5–15.5)
WBC: 9.8 10*3/uL (ref 4.0–10.5)

## 2017-09-09 LAB — BASIC METABOLIC PANEL
Anion gap: 9 (ref 5–15)
BUN: 16 mg/dL (ref 8–23)
CALCIUM: 8.8 mg/dL — AB (ref 8.9–10.3)
CHLORIDE: 103 mmol/L (ref 98–111)
CO2: 25 mmol/L (ref 22–32)
Creatinine, Ser: 0.62 mg/dL (ref 0.44–1.00)
GFR calc Af Amer: >60 mL/min (ref >60)
GFR calc non Af Amer: >60 mL/min (ref >60)
GLUCOSE: 130 mg/dL — AB (ref 70–99)
Potassium: 3.7 mmol/L (ref 3.5–5.1)
Sodium: 137 mmol/L (ref 135–145)

## 2017-09-09 LAB — I-STAT TROPONIN, ED: Troponin i, poc: 0.01 ng/mL (ref 0.00–0.08)

## 2017-09-09 LAB — I-STAT CG4 LACTIC ACID, ED: Lactic Acid, Venous: 1.15 mmol/L (ref 0.5–1.9)

## 2017-09-09 LAB — BRAIN NATRIURETIC PEPTIDE: B Natriuretic Peptide: 95.1 pg/mL (ref 0.0–100.0)

## 2017-09-09 MED ORDER — SODIUM CHLORIDE 0.9 % IV SOLN
500.0000 mg | Freq: Once | INTRAVENOUS | Status: AC
  Administered 2017-09-09: 500 mg via INTRAVENOUS
  Filled 2017-09-09: qty 500

## 2017-09-09 MED ORDER — IPRATROPIUM-ALBUTEROL 0.5-2.5 (3) MG/3ML IN SOLN
3.0000 mL | Freq: Once | RESPIRATORY_TRACT | Status: AC
  Administered 2017-09-09: 3 mL via RESPIRATORY_TRACT
  Filled 2017-09-09: qty 3

## 2017-09-09 MED ORDER — AZITHROMYCIN 250 MG PO TABS: 250.0000 mg | ORAL_TABLET | Freq: Every day | ORAL | 0 refills | Status: DC

## 2017-09-09 NOTE — ED Provider Notes (Signed)
[facility] EMERGENCY DEPARTMENT Provider Note  CSN: [id number] Arrival date & time: 09/08/17 2256  Chief Complaint(s) Chest Pain  HPI Kelsey Diaz is a 72 y.o. female   The history is provided by the patient.  Chest Pain   This is a new problem. Episode onset: 3 days. The problem occurs constantly. The problem has not changed since onset.The pain is associated with movement, coughing and breathing. Pain location: right anterior chest, right shoulder; left anterior chest. The pain is moderate. The quality of the pain is described as pressure-like. The pain radiates to the upper back. The symptoms are aggravated by certain positions and deep breathing. Associated symptoms include back pain, cough (1 week of dry cough), a fever, nausea and shortness of breath (DOE). Pertinent negatives include no hemoptysis, no leg pain, no lower extremity edema, no sputum production and no vomiting. Risk factors include obesity.  Her past medical history is significant for COPD, hyperlipidemia and hypertension.  Pertinent negatives for past medical history include no CHF, no diabetes, no PE and no TIA.  Procedure history is positive for cardiac catheterization (05/2017 with clean coronary arteries. EF 55%).    Past Medical History Past Medical History:  Diagnosis Date  . Allergic rhinitis   . Anxiety   . Asthma   . Colitis   . COPD (chronic obstructive pulmonary disease) ([place])   . Fibromyalgia   . GERD (gastroesophageal reflux disease)    With esophageal strictures  . Hypertension   . Hyperthyroidism   . OSA (obstructive sleep apnea) 02/09/2012   No mask.  Did not tolerate  . Osteoarthritis    Patient Active Problem List   Diagnosis Date Noted  . Abnormal nuclear stress test 06/03/2017  . Chest pain 06/03/2017  . Dizzy 06/13/2012  . OSA (obstructive sleep apnea) 02/09/2012  . COPD with asthma ([place]) 09/07/2011   Home Medication(s) Prior to Admission medications     Medication Sig Start Date End Date Taking? Authorizing Provider  albuterol (PROAIR HFA) 108 (90 BASE) MCG/ACT inhaler Inhale 2 puffs into the lungs every 6 (six) hours as needed for shortness of breath.     [provider]  azithromycin (ZITHROMAX) 250 MG tablet Take 1 tablet (250 mg total) by mouth daily. Take 1 tablet every day until finished. 09/09/17   CardamaGrayce Sessions, MD  Calcium Carbonate-Vitamin D (CALCIUM + D) 600-200 MG-UNIT TABS Take 1 tablet by mouth daily.     [provider]  clobetasol cream (TEMOVATE) 8.09 % Apply 1 application topically 2 (two) times daily as needed (irritation).     [provider]  DULoxetine (CYMBALTA) 60 MG capsule Take 60 mg by mouth daily.    [provider]  fluocinonide (LIDEX) 0.05 % external solution Apply 1 application topically 2 (two) times daily as needed (skin irritation).     [provider]  Fluticasone-Umeclidin-Vilant (TRELEGY ELLIPTA) 100-62.5-25 MCG/INH AEPB Inhale 1 puff into the lungs daily. 08/10/17   Mannam, Hart Robinsons, MD  HYDROcodone-acetaminophen (NORCO) 7.5-325 MG tablet Take 1 tablet by mouth 3 (three) times daily. 07/14/17   Molli Barrows, MD  LORazepam (ATIVAN) 0.5 MG tablet Take 0.5 mg by mouth every 8 (eight) hours as needed for anxiety.     [provider]  meloxicam (MOBIC) 15 MG tablet Take 1 tablet (15 mg total) by mouth daily. 06/07/17   Minus Breeding, MD  methocarbamol (ROBAXIN) 500 MG tablet Take 1 tablet (500 mg total) by mouth 4 (four) times  daily. Patient taking differently: Take 500 mg by mouth 4 (four) times daily as needed for muscle spasms.  03/24/17   Molli Barrows, MD  Multiple Vitamin (MULTI-VITAMIN PO) Take 1 tablet by mouth daily.     [provider]  Omega-3 Fatty Acids (FISH OIL PO) Take 1,000 mg by mouth daily.     [provider]  omeprazole (PRILOSEC) 40 MG capsule Take 1 capsule by mouth daily. 04/08/17   [provider]   Ospemifene (OSPHENA) 60 MG TABS Take 60 mg by mouth every other day.     [provider]  PREMARIN vaginal cream Place 1 Applicatorful vaginally daily as needed.  01/27/16   [provider]  valsartan-hydrochlorothiazide (DIOVAN-HCT) 160-12.5 MG tablet Take 1 tablet by mouth daily.  11/19/15   [provider]                                                                                                                                    Past Surgical History Past Surgical History:  Procedure Laterality Date  . BREAST BIOPSY     x2  . CHOLECYSTECTOMY     2005  . KNEE SURGERY Right 2013   two torn ligaments repaired.   Marland Kitchen LEFT HEART CATH AND CORONARY ANGIOGRAPHY N/A 06/08/2017   Procedure: LEFT HEART CATH AND CORONARY ANGIOGRAPHY;  Surgeon: Troy Sine, MD;  Location: [facility] [facility];  Service: Cardiovascular;  Laterality: N/A;  . NASAL SINUS SURGERY    . VAGINAL HYSTERECTOMY     Family History Family History  Problem Relation Age of Onset  . Hypertension Mother   . Diabetes Mother   . Allergies Mother   . Heart attack Father 52  . Cancer Father        kidney  . Emphysema Father   . Heart disease Father        CHF age 26  . Heart attack Brother 46       Died of MI age 3    Social History Social History   Tobacco Use  . Smoking status: Former Smoker    Packs/day: 2.00    Years: 40.00    Pack years: 80.00    Types: Cigarettes    Last attempt to quit: 02/24/1999    Years since quitting: 18.5  . Smokeless tobacco: Never Used  Substance Use Topics  . Alcohol use: Yes    Comment: occassioal  . Drug use: No   Allergies Morphine; Advair diskus [fluticasone-salmeterol]; Ambien [zolpidem tartrate]; Amlodipine besylate; Bextra [valdecoxib]; Captopril; Latex; Lisinopril; Oxycodone hcl; Pulmicort [budesonide]; Savella [milnacipran hcl]; and Spiriva [tiotropium bromide monohydrate]  Review of Systems Review of Systems  Constitutional: Positive  for fever.  Respiratory: Positive for cough (1 week of dry cough) and shortness of breath (DOE). Negative for hemoptysis and sputum production.   Cardiovascular: Positive for chest pain.  Gastrointestinal: Positive for nausea. Negative for vomiting.  Musculoskeletal: Positive for back pain.   All other systems are reviewed and are negative for acute change except as noted in the HPI  Physical Exam Vital Signs  I have reviewed the triage vital signs BP 126/66   Pulse 98   Temp (!) 100.6 F (38.1 C) (Oral)   Resp 18   Ht 5\' 6"  (1.676 m)   Wt 89.8 kg (198 lb)   LMP  (Exact Date)   SpO2 94%   BMI 31.96 kg/m   Physical Exam  Constitutional: She is oriented to person, place, and time. She appears well-developed and well-nourished. No distress.  HENT:  Head: Normocephalic and atraumatic.  Nose: Nose normal.  Eyes: Pupils are equal, round, and reactive to light. Conjunctivae and EOM are normal. Right eye exhibits no discharge. Left eye exhibits no discharge. No scleral icterus.  Neck: Normal range of motion. Neck supple.  Cardiovascular: Normal rate and regular rhythm. Exam reveals no gallop and no friction rub.  No murmur heard. Pulmonary/Chest: Effort normal. No stridor. No respiratory distress. She has rales (fine) in the right middle field, the right lower field, the left middle field and the left lower field.     She exhibits tenderness.    Abdominal: Soft. She exhibits no distension. There is no tenderness.  Musculoskeletal: She exhibits no edema or tenderness.  Neurological: She is alert and oriented to person, place, and time.  Skin: Skin is warm and dry. No rash noted. She is not diaphoretic. No erythema.  Psychiatric: She has a normal mood and affect.  Vitals reviewed.   ED Results and Treatments Labs (all labs ordered are listed, but only abnormal results are displayed) Labs Reviewed  BASIC METABOLIC PANEL - Abnormal; Notable for the following components:       Result Value   Glucose, Bld 130 (*)    Calcium 8.8 (*)    All other components within normal limits  CBC - Abnormal; Notable for the following components:   Hemoglobin 11.8 (*)    HCT 35.3 (*)    All other components within normal limits  BRAIN NATRIURETIC PEPTIDE  I-STAT TROPONIN, ED  I-STAT CG4 LACTIC ACID, ED                                                                                                                         EKG  EKG Interpretation  Date/Time:  Wednesday September 08 2017 22:58:13 EDT Ventricular Rate:  112 PR Interval:    QRS Duration: 83 QT Interval:  315 QTC Calculation: 430 R Axis:   -9 Text Interpretation:  Sinus tachycardia Low voltage, precordial leads Otherwise no significant change Confirmed by Addison Lank [phone]) on 09/08/2017 11:28:29 PM      Radiology Dg Chest 2 View  Result Date: 09/08/2017 CLINICAL DATA:  Right-sided chest pain ongoing for 3 days radiating to the right shoulder and neck. EXAM: CHEST - 2 VIEW COMPARISON:  03/18/2017 FINDINGS: AP and lateral views of the chest. Mild  cardiac enlargement in part due to the AP projection. Moderate aortic atherosclerosis without aneurysm. Mild interstitial edema and bibasilar atelectasis is noted. Slight blunting the posterior costophrenic angles may reflect trace effusions. IMPRESSION: Mild cardiac enlargement with interstitial edema and trace posterior pleural effusions. Aortic atherosclerosis. Electronically Signed   By: Ashley Royalty M.D.   On: 09/08/2017 23:43   Pertinent labs & imaging results that were available during my care of the patient were reviewed by me and considered in my medical decision making (see chart for details).  Medications Ordered in ED Medications  azithromycin (ZITHROMAX) 500 mg in sodium chloride 0.9 % 250 mL IVPB (0 mg Intravenous Stopped 09/09/17 0155)  ipratropium-albuterol (DUONEB) 0.5-2.5 (3) MG/3ML nebulizer solution 3 mL (3 mLs Nebulization Given 09/09/17 0045)                                                                                                                                     Procedures Procedures  (including critical care time)  Medical Decision Making / ED Course I have reviewed the nursing notes for this encounter and the patient's prior records (if available in EHR or on provided paperwork).    Patient here with 1 week of cough and several days of bilateral anterior chest wall and right shoulder girdle pain.  Pain is inconsistent with ACS.  EKG without acute ischemic changes or evidence of pericarditis.  Initial troponin was negative.  Given duration of symptomatology with negative troponin, I feel this is sufficient to rule out ACS at this time.  Additionally patient had a nearly clean heart catheterization in April with an EF of 55%.  Low suspicion for pulmonary embolism.  Presentation not classic for aortic dissection or esophageal perforation.  Chest x-ray does reveal evidence of diffuse interstitial opacities concerning for edema however given her symptomatology I have suspicion for atypical pneumonia.  BMP was ordered and negative.  Unlikely new onset heart failure.  Patient was given dose of IV azithromycin.  She was able to ambulate and maintain her saturations on room air.  I feel the patient is stable enough to be discharged home with outpatient antibiotic medication.  Her chest pain appears to be muscular in nature.  She was provided with Toradol resulting in some improvement.  Final Clinical Impression(s) / ED Diagnoses Final diagnoses:  Atypical pneumonia   Disposition: Discharge  Condition: Good  I have discussed the results, Dx and Tx plan with the patient and family who expressed understanding and agree(s) with the plan. Discharge instructions discussed at great length. The patient and family was given strict return precautions who verbalized understanding of the instructions. No further questions at time of discharge.      ED Discharge Orders        Ordered    azithromycin (ZITHROMAX) 250 MG tablet  Daily     09/09/17 0314       Follow Up: Harlan Stains,  MD [place] [address] [phone]  Schedule an appointment as soon as possible for a visit  in 3-5 days, For close follow up.      This chart was dictated using voice recognition software.  Despite best efforts to proofread,  errors can occur which can change the documentation meaning.   Fatima Blank, MD 09/09/17 [phone]

## 2017-09-09 NOTE — ED Notes (Signed)
Ambulated pt in hall with steady gait, pulse ox stayed within normal limits. Notified Scott(RN)

## 2017-09-13 ENCOUNTER — Ambulatory Visit: Payer: Medicare Other | Admitting: Primary Care

## 2017-09-13 ENCOUNTER — Ambulatory Visit (INDEPENDENT_AMBULATORY_CARE_PROVIDER_SITE_OTHER)
Admission: RE | Admit: 2017-09-13 | Discharge: 2017-09-13 | Disposition: A | Discharge status: Home / Self Care | Payer: Medicare Other | Source: Ambulatory Visit | Attending: Primary Care | Admitting: Primary Care

## 2017-09-13 ENCOUNTER — Other Ambulatory Visit (INDEPENDENT_AMBULATORY_CARE_PROVIDER_SITE_OTHER): Payer: Medicare Other

## 2017-09-13 ENCOUNTER — Encounter: Payer: Self-pay | Admitting: Primary Care

## 2017-09-13 ENCOUNTER — Telehealth: Payer: Self-pay | Admitting: Pulmonary Disease

## 2017-09-13 VITALS — BP 168/90 | HR 82 | Ht 66.5 in | Wt 201.6 lb

## 2017-09-13 DIAGNOSIS — I10 Essential (primary) hypertension: Secondary | ICD-10-CM

## 2017-09-13 DIAGNOSIS — J189 Pneumonia, unspecified organism: Secondary | ICD-10-CM

## 2017-09-13 HISTORY — DX: Pneumonia, unspecified organism: J18.9

## 2017-09-13 LAB — BASIC METABOLIC PANEL
BUN: 11 mg/dL (ref 6–23)
CHLORIDE: 100 meq/L (ref 96–112)
CO2: 30 mEq/L (ref 19–32)
CREATININE: 0.72 mg/dL (ref 0.40–1.20)
Calcium: 8.9 mg/dL (ref 8.4–10.5)
GFR: 84.47 mL/min (ref >60.00)
Glucose, Bld: 98 mg/dL (ref 70–99)
Potassium: 3.4 mEq/L — ABNORMAL LOW (ref 3.5–5.1)
Sodium: 138 mEq/L (ref 135–145)

## 2017-09-13 NOTE — Telephone Encounter (Signed)
Called and spoke to Kelsey Diaz. Kelsey Diaz states she was seen in ED last week for pna. Kelsey Diaz states she was d/c with oral abx but states she could not finish the course as it made her sick to her stomach. Kelsey Diaz requesting an appt. Appt made with Eustaquio Maize, NP, today (09/13/17) to discuss. Kelsey Diaz verbalized understanding and denied any further questions or concerns at this time.

## 2017-09-13 NOTE — Assessment & Plan Note (Signed)
-   HCTZ in valsartan reportedly discontinued. Continues Diovan 160mg   - BP elevated, 168/90  - Instructed patient to take additional tab of Diovan this afternoon (for total of 360mg  today) - Restart Valsartan tomorrow am and follow up with PCP  - Check BP daily x 3 days, present to ED/CALL if SBP>190  - Ordered BMET today, BUN/Cr normal. K 3.4, instructed to eat foods rich in potassium.

## 2017-09-13 NOTE — Assessment & Plan Note (Signed)
-   ED visit on 7/17 for pleuritic chest pain, cough, sob and fever - CXR showing interstitial opacities concerning for edema, given symptoms suspected atypical PNA treated with azithromycin - Reports nausea and HA with abx, stopped taking (has 1 day left) - Will recheck CXR, if persistent will change to levaquin.

## 2017-09-13 NOTE — Patient Instructions (Addendum)
Recheck CXR today, will call tomorrow with results  -Please take additional Diovan tab this afternoon for elevated Blood pressure today in office (168/90) -Restart Valsartan (diovan-HCTZ) tomorrow am and follow up/ call primary care d/t high blood pressure -Check BP daily for the next three days. Please let us know how you are doing.  -If SBP>190 present to ED - Labs today

## 2017-09-13 NOTE — Progress Notes (Signed)
@Patient  ID: Kelsey Diaz, female    DOB: [birth year], 72 y.o.   MRN: [id number]  Chief Complaint  Patient presents with  . Acute Visit    recent ED visit-dx with PNA. c/o non prod cough, sob with exertion & chest tightness.      Referring provider: Harlan Stains, MD  HPI: 72 year old female. Hx COPD, HTN. Patient of Dr. Vaughan Browner, last seen 6/18. Patient recently seen in ED with pleuritic chest pain, cough, sob and fever. Dx with atypical PNA and discharged home on azithromycin.   09/13/2017  Presents today with complaints of nausea and HA from abx.Reports feeling tired and winded with a deep breath. Pleuritic pain has resolved. She has one day left of azithromycin. BP elevated during today's visit, 168/90 left arm. Patient states that her PCP, Dr. Joya Gaskins, took her off the HCTZ in her valsartan. Patient thinks she is currently taking Diovan 160mg  for her blood pressure. She reports taking a decongestant yesterday. HA and nausea have improved somewhat since stopping abx. Denies cp, cough, wheeze.   Allergies  Allergen Reactions  . Morphine Other (See Comments)    Stopped heart  . Advair Diskus [Fluticasone-Salmeterol]     Sensitivity to smells  . Ambien [Zolpidem Tartrate]     irritable  . Amlodipine Besylate     Feels bad  . Bextra [Valdecoxib]     Stomach issues  . Captopril     Ache  . Latex Itching  . Lisinopril     cough  . Oxycodone Hcl   . Pulmicort [Budesonide]     Worse breathing, sensitivity to smells  . Savella [Milnacipran Hcl]     Mental fog  . Spiriva [Tiotropium Bromide Monohydrate]     Dry mouth    Immunization History  Administered Date(s) Administered  . Influenza Split 11/24/2010  . Influenza Whole 11/24/2011  . Influenza, High Dose Seasonal PF 11/02/2016  . Pneumococcal Conjugate-13 03/16/2013  . Pneumococcal Polysaccharide-23 11/24/2010, 03/01/2017    Past Medical History:  Diagnosis Date  . Allergic rhinitis   . Anxiety   . Asthma   .  Colitis   . COPD (chronic obstructive pulmonary disease) ([place])   . Fibromyalgia   . GERD (gastroesophageal reflux disease)    With esophageal strictures  . Hypertension   . Hyperthyroidism   . OSA (obstructive sleep apnea) 02/09/2012   No mask.  Did not tolerate  . Osteoarthritis     Tobacco History: Social History   Tobacco Use  Smoking Status Former Smoker  . Packs/day: 2.00  . Years: 40.00  . Pack years: 80.00  . Types: Cigarettes  . Last attempt to quit: 02/24/1999  . Years since quitting: 18.5  Smokeless Tobacco Never Used   Counseling given: Not Answered   Outpatient Medications Prior to Visit  Medication Sig Dispense Refill  . albuterol (PROAIR HFA) 108 (90 BASE) MCG/ACT inhaler Inhale 2 puffs into the lungs every 6 (six) hours as needed for shortness of breath.     . Calcium Carbonate-Vitamin D (CALCIUM + D) 600-200 MG-UNIT TABS Take 1 tablet by mouth daily.     . clobetasol cream (TEMOVATE) 4.09 % Apply 1 application topically 2 (two) times daily as needed (irritation).     . DULoxetine (CYMBALTA) 60 MG capsule Take 60 mg by mouth daily.    . fluocinonide (LIDEX) 0.05 % external solution Apply 1 application topically 2 (two) times daily as needed (skin irritation).     . Fluticasone-Umeclidin-Vilant (TRELEGY  ELLIPTA) 100-62.5-25 MCG/INH AEPB Inhale 1 puff into the lungs daily. 1 each 6  . HYDROcodone-acetaminophen (NORCO) 7.5-325 MG tablet Take 1 tablet by mouth 3 (three) times daily. 90 tablet 0  . LORazepam (ATIVAN) 0.5 MG tablet Take 0.5 mg by mouth every 8 (eight) hours as needed for anxiety.     . meloxicam (MOBIC) 15 MG tablet Take 1 tablet (15 mg total) by mouth daily.    . methocarbamol (ROBAXIN) 500 MG tablet Take 1 tablet (500 mg total) by mouth 4 (four) times daily. (Patient taking differently: Take 500 mg by mouth 4 (four) times daily as needed for muscle spasms. ) 120 tablet 2  . Multiple Vitamin (MULTI-VITAMIN PO) Take 1 tablet by mouth daily.     .  Omega-3 Fatty Acids (FISH OIL PO) Take 1,000 mg by mouth daily.     Marland Kitchen omeprazole (PRILOSEC) 40 MG capsule Take 1 capsule by mouth daily.  1  . Ospemifene (OSPHENA) 60 MG TABS Take 60 mg by mouth every other day.     Marland Kitchen PREMARIN vaginal cream Place 1 Applicatorful vaginally daily as needed.     . valsartan-hydrochlorothiazide (DIOVAN-HCT) 160-12.5 MG tablet Take 1 tablet by mouth daily.     Marland Kitchen azithromycin (ZITHROMAX) 250 MG tablet Take 1 tablet (250 mg total) by mouth daily. Take 1 tablet every day until finished. 4 tablet 0   Facility-Administered Medications Prior to Visit  Medication Dose Route Frequency Provider Last Rate Last Dose  . betamethasone acetate-betamethasone sodium phosphate (CELESTONE) injection 3 mg  3 mg Intramuscular Once Edrick Kins, DPM          Review of Systems  Review of Systems  Constitutional: Negative.   HENT: Negative.   Respiratory: Positive for shortness of breath. Negative for cough, wheezing and stridor.   Cardiovascular: Negative.   Gastrointestinal: Negative for nausea.  Skin: Negative.   Neurological: Positive for headaches. Negative for dizziness, seizures and speech difficulty.     Physical Exam  BP (!) 168/90 (BP Location: Left Arm, Patient Position: Sitting, Cuff Size: Normal)   Pulse 82   Ht 5' 6.5" (1.689 m)   Wt 201 lb 9.6 oz (91.4 kg)   SpO2 98%   BMI 32.05 kg/m  Physical Exam  Constitutional: She is oriented to person, place, and time. She appears well-developed and well-nourished.  HENT:  Head: Normocephalic and atraumatic.  Eyes: Pupils are equal, round, and reactive to light. EOM are normal.  Neck: Normal range of motion. Neck supple.  Cardiovascular: Normal rate, regular rhythm and normal heart sounds.  No murmur heard. Pulmonary/Chest: Effort normal and breath sounds normal. No respiratory distress. She has no wheezes.  Abdominal: Soft. Bowel sounds are normal. There is no tenderness.  Neurological: She is alert and  oriented to person, place, and time.  Skin: Skin is warm and dry. No rash noted. No erythema.  Psychiatric: She has a normal mood and affect. Her behavior is normal. Judgment normal.     Lab Results:  CBC    Component Value Date/Time   WBC 9.8 09/08/2017 2350   RBC 3.98 09/08/2017 2350   HGB 11.8 (L) 09/08/2017 2350   HGB 14.2 06/03/2017 1054   HCT 35.3 (L) 09/08/2017 2350   HCT 42.1 06/03/2017 1054   PLT 189 09/08/2017 2350   PLT 244 06/03/2017 1054   MCV 88.7 09/08/2017 2350   MCV 87 06/03/2017 1054   MCH 29.6 09/08/2017 2350   MCHC 33.4 09/08/2017 2350  RDW 12.9 09/08/2017 2350   RDW 13.1 06/03/2017 1054   LYMPHSABS 1.9 03/25/2017 1517   MONOABS 0.7 03/25/2017 1517   EOSABS 0.3 03/25/2017 1517   BASOSABS 0.1 03/25/2017 1517    BMET    Component Value Date/Time   NA 138 09/13/2017 1628   NA 137 06/03/2017 1054   K 3.4 (L) 09/13/2017 1628   CL 100 09/13/2017 1628   CO2 30 09/13/2017 1628   GLUCOSE 98 09/13/2017 1628   BUN 11 09/13/2017 1628   BUN 19 06/03/2017 1054   CREATININE 0.72 09/13/2017 1628   CALCIUM 8.9 09/13/2017 1628   GFRNONAA >60 09/08/2017 2350   GFRAA >60 09/08/2017 2350    BNP    Component Value Date/Time   BNP 95.1 09/08/2017 2350    ProBNP No results found for: PROBNP  Imaging: Dg Chest 2 View  Result Date: 09/08/2017 CLINICAL DATA:  Right-sided chest pain ongoing for 3 days radiating to the right shoulder and neck. EXAM: CHEST - 2 VIEW COMPARISON:  03/18/2017 FINDINGS: AP and lateral views of the chest. Mild cardiac enlargement in part due to the AP projection. Moderate aortic atherosclerosis without aneurysm. Mild interstitial edema and bibasilar atelectasis is noted. Slight blunting the posterior costophrenic angles may reflect trace effusions. IMPRESSION: Mild cardiac enlargement with interstitial edema and trace posterior pleural effusions. Aortic atherosclerosis. Electronically Signed   By: Ashley Royalty M.D.   On: 09/08/2017 23:43      Assessment & Plan:   HTN (hypertension) - HCTZ in valsartan reportedly discontinued. Continues Diovan 160mg   - BP elevated, 168/90  - Instructed patient to take additional tab of Diovan this afternoon (for total of 360mg  today) - Restart Valsartan tomorrow am and follow up with PCP  - Check BP daily x 3 days, present to ED/CALL if SBP>190  - Ordered BMET today, BUN/Cr normal. K 3.4, instructed to eat foods rich in potassium.   PNA (pneumonia) - ED visit on 7/17 for pleuritic chest pain, cough, sob and fever - CXR showing interstitial opacities concerning for edema, given symptoms suspected atypical PNA treated with azithromycin - Reports nausea and HA with abx, stopped taking (has 1 day left) - Will recheck CXR, if persistent will change to levaquin.    Martyn Ehrich, NP 09/13/2017

## 2017-09-14 ENCOUNTER — Telehealth: Payer: Self-pay | Admitting: Primary Care

## 2017-09-14 NOTE — Telephone Encounter (Signed)
Please let patient know CXR did not show an active disease. No PNA. Chronic bronchitic and emphysemic changes. How is her BP?

## 2017-09-14 NOTE — Telephone Encounter (Signed)
Pt is aware of results and voiced her understanding. Pt has pending apt today at 10:30a with PCP to discuss BP. BP this morning at 7:30a was 179/91 prior to taking Diovan. Pt stated she waited 1hr after taking diovan and BP was 168/81. Kelsey Diaz has been made aware of BP reading verbally.  Nothing further is needed.

## 2017-09-27 ENCOUNTER — Ambulatory Visit: Payer: Medicare Other | Admitting: Anesthesiology

## 2017-09-30 ENCOUNTER — Ambulatory Visit: Payer: Medicare Other | Attending: Anesthesiology | Admitting: Anesthesiology

## 2017-09-30 ENCOUNTER — Other Ambulatory Visit: Payer: Self-pay

## 2017-09-30 ENCOUNTER — Encounter: Payer: Self-pay | Admitting: Anesthesiology

## 2017-09-30 VITALS — BP 105/73 | HR 93 | Temp 98.4°F | Resp 16 | Ht 66.5 in | Wt 199.0 lb

## 2017-09-30 DIAGNOSIS — M5116 Intervertebral disc disorders with radiculopathy, lumbar region: Secondary | ICD-10-CM | POA: Diagnosis not present

## 2017-09-30 DIAGNOSIS — M47817 Spondylosis without myelopathy or radiculopathy, lumbosacral region: Secondary | ICD-10-CM | POA: Insufficient documentation

## 2017-09-30 DIAGNOSIS — Z79891 Long term (current) use of opiate analgesic: Secondary | ICD-10-CM | POA: Insufficient documentation

## 2017-09-30 DIAGNOSIS — M5136 Other intervertebral disc degeneration, lumbar region: Secondary | ICD-10-CM

## 2017-09-30 DIAGNOSIS — J189 Pneumonia, unspecified organism: Secondary | ICD-10-CM | POA: Diagnosis not present

## 2017-09-30 DIAGNOSIS — M4716 Other spondylosis with myelopathy, lumbar region: Secondary | ICD-10-CM | POA: Diagnosis not present

## 2017-09-30 DIAGNOSIS — M5106 Intervertebral disc disorders with myelopathy, lumbar region: Secondary | ICD-10-CM | POA: Insufficient documentation

## 2017-09-30 DIAGNOSIS — M5432 Sciatica, left side: Secondary | ICD-10-CM

## 2017-09-30 DIAGNOSIS — M5431 Sciatica, right side: Secondary | ICD-10-CM

## 2017-09-30 DIAGNOSIS — Z7951 Long term (current) use of inhaled steroids: Secondary | ICD-10-CM | POA: Insufficient documentation

## 2017-09-30 DIAGNOSIS — G894 Chronic pain syndrome: Secondary | ICD-10-CM | POA: Diagnosis not present

## 2017-09-30 DIAGNOSIS — Z79899 Other long term (current) drug therapy: Secondary | ICD-10-CM | POA: Diagnosis not present

## 2017-09-30 DIAGNOSIS — M545 Low back pain: Secondary | ICD-10-CM | POA: Diagnosis present

## 2017-09-30 DIAGNOSIS — M25552 Pain in left hip: Secondary | ICD-10-CM | POA: Diagnosis not present

## 2017-09-30 DIAGNOSIS — M1388 Other specified arthritis, other site: Secondary | ICD-10-CM | POA: Diagnosis not present

## 2017-09-30 DIAGNOSIS — F1190 Opioid use, unspecified, uncomplicated: Secondary | ICD-10-CM

## 2017-09-30 MED ORDER — METHOCARBAMOL 500 MG PO TABS: 500.0000 mg | ORAL_TABLET | Freq: Four times a day (QID) | ORAL | 2 refills | Status: DC

## 2017-09-30 MED ORDER — HYDROCODONE-ACETAMINOPHEN 7.5-325 MG PO TABS: 1.0000 | ORAL_TABLET | Freq: Three times a day (TID) | ORAL | 0 refills | Status: DC

## 2017-09-30 NOTE — Patient Instructions (Signed)
____________________________________________________________________________________________  Preparing for your procedure (without sedation)  Instructions: . Oral Intake: Do not eat or drink anything for at least 3 hours prior to your procedure. . Transportation: Unless otherwise stated by your physician, you may drive yourself after the procedure. . Blood Pressure Medicine: Take your blood pressure medicine with a sip of water the morning of the procedure. . Blood thinners: Notify our staff if you are taking any blood thinners. Depending on which one you take, there will be specific instructions on how and when to stop it. . Diabetics on insulin: Notify the staff so that you can be scheduled 1st case in the morning. If your diabetes requires high dose insulin, take only  of your normal insulin dose the morning of the procedure and notify the staff that you have done so. . Preventing infections: Shower with an antibacterial soap the morning of your procedure.  . Build-up your immune system: Take 1000 mg of Vitamin C with every meal (3 times a day) the day prior to your procedure. Marland Kitchen Antibiotics: Inform the staff if you have a condition or reason that requires you to take antibiotics before dental procedures. . Pregnancy: If you are pregnant, call and cancel the procedure. . Sickness: If you have a cold, fever, or any active infections, call and cancel the procedure. . Arrival: You must be in the facility at least 30 minutes prior to your scheduled procedure. . Children: Do not bring any children with you. . Dress appropriately: Bring dark clothing that you would not mind if they get stained. . Valuables: Do not bring any jewelry or valuables.  Procedure appointments are reserved for interventional treatments only. Marland Kitchen No Prescription Refills. . No medication changes will be discussed during procedure appointments. . No disability issues will be discussed.  Reasons to call and reschedule or  cancel your procedure: (Following these recommendations will minimize the risk of a serious complication.) . Surgeries: Avoid having procedures within 2 weeks of any surgery. (Avoid for 2 weeks before or after any surgery). . Flu Shots: Avoid having procedures within 2 weeks of a flu shots or . (Avoid for 2 weeks before or after immunizations). . Barium: Avoid having a procedure within 7-10 days after having had a radiological study involving the use of radiological contrast. (Myelograms, Barium swallow or enema study). . Heart attacks: Avoid any elective procedures or surgeries for the initial 6 months after a "Myocardial Infarction" (Heart Attack). . Blood thinners: It is imperative that you stop these medications before procedures. Let us know if you if you take any blood thinner.  . Infection: Avoid procedures during or within two weeks of an infection (including chest colds or gastrointestinal problems). Symptoms associated with infections include: Localized redness, fever, chills, night sweats or profuse sweating, burning sensation when voiding, cough, congestion, stuffiness, runny nose, sore throat, diarrhea, nausea, vomiting, cold or Flu symptoms, recent or current infections. It is specially important if the infection is over the area that we intend to treat. Marland Kitchen Heart and lung problems: Symptoms that may suggest an active cardiopulmonary problem include: cough, chest pain, breathing difficulties or shortness of breath, dizziness, ankle swelling, uncontrolled high or unusually low blood pressure, and/or palpitations. If you are experiencing any of these symptoms, cancel your procedure and contact your primary care physician for an evaluation.  Remember:  Regular Business hours are:  Monday to Thursday 8:00 AM to 4:00 PM  Provider's Schedule: Milinda Pointer, MD:  Procedure days: Tuesday and Thursday 7:30 AM to  4:00 PM  Bilal Lateef, MD:  Procedure days: Monday and Wednesday 7:30 AM to 4:00  PM ____________________________________________________________________________________________    

## 2017-09-30 NOTE — Progress Notes (Signed)
Subjective:  Patient ID: Kelsey Diaz, female    DOB: May 04, 1945  Age: 72 y.o. MRN: [id number]  CC: Back Pain (lower)   Procedure: None  HPI Kelsey Diaz presents for reevaluation.  She has chronic low back pain and this is been stable in nature but she is had a mild exacerbation in the bilateral lower extremity pain that she experiences.  She receives periodic lumbar epidural steroids and her last injection was in May and she feels like this did help her significantly with a 50 to 75% reduction in her lower extremity pain.  This lasted 2 months and she is had some recurrence and desires to proceed with a repeat epidural however she is currently being treated with antibiotics for a pneumonia.  She is making good progress there and has been afebrile but we we will defer on her injection today.  She is doing her physical therapy stretching strengthening exercises and her pain has been stable in nature.  She is taking her medications as prescribed and using these approximately 2-3 times a day at the Vicodin 7.5 mg strength.  No diverting or illicit use is noted and no side effects are noted based on her narcotic assessment sheet and she continues to derive good functional lifestyle improvement with her medications.  Outpatient Medications Prior to Visit  Medication Sig Dispense Refill  . albuterol (PROAIR HFA) 108 (90 BASE) MCG/ACT inhaler Inhale 2 puffs into the lungs every 6 (six) hours as needed for shortness of breath.     . clobetasol cream (TEMOVATE) 4.40 % Apply 1 application topically 2 (two) times daily as needed (irritation).     . DULoxetine (CYMBALTA) 60 MG capsule Take 60 mg by mouth daily.    . fluocinonide (LIDEX) 0.05 % external solution Apply 1 application topically 2 (two) times daily as needed (skin irritation).     . Fluticasone-Umeclidin-Vilant (TRELEGY ELLIPTA) 100-62.5-25 MCG/INH AEPB Inhale 1 puff into the lungs daily. 1 each 6  . LORazepam (ATIVAN) 0.5 MG tablet  Take 0.5 mg by mouth every 8 (eight) hours as needed for anxiety.     . meloxicam (MOBIC) 15 MG tablet Take 1 tablet (15 mg total) by mouth daily.    . Multiple Vitamin (MULTI-VITAMIN PO) Take 1 tablet by mouth daily.     . Omega-3 Fatty Acids (FISH OIL PO) Take 1,000 mg by mouth daily.     Marland Kitchen omeprazole (PRILOSEC) 40 MG capsule Take 1 capsule by mouth daily.  1  . Ospemifene (OSPHENA) 60 MG TABS Take 60 mg by mouth every other day.     Marland Kitchen PREMARIN vaginal cream Place 1 Applicatorful vaginally daily as needed.     . valsartan-hydrochlorothiazide (DIOVAN-HCT) 160-12.5 MG tablet Take 1 tablet by mouth daily.     Marland Kitchen HYDROcodone-acetaminophen (NORCO) 7.5-325 MG tablet Take 1 tablet by mouth 3 (three) times daily. 90 tablet 0  . methocarbamol (ROBAXIN) 500 MG tablet Take 1 tablet (500 mg total) by mouth 4 (four) times daily. (Patient taking differently: Take 500 mg by mouth 4 (four) times daily as needed for muscle spasms. ) 120 tablet 2  . Calcium Carbonate-Vitamin D (CALCIUM + D) 600-200 MG-UNIT TABS Take 1 tablet by mouth daily.      Facility-Administered Medications Prior to Visit  Medication Dose Route Frequency Provider Last Rate Last Dose  . betamethasone acetate-betamethasone sodium phosphate (CELESTONE) injection 3 mg  3 mg Intramuscular Once Edrick Kins, DPM  Review of Systems CNS: No confusion or sedation Cardiac: No angina or palpitations GI: No abdominal pain or constipation Restitution: No nausea vomiting fevers or chills  Objective:  BP 105/73   Pulse 93   Temp 98.4 F (36.9 C) (Oral)   Resp 16   Ht 5' 6.5" (1.689 m)   Wt 199 lb (90.3 kg)   SpO2 97%   BMI 31.64 kg/m    BP Readings from Last 3 Encounters:  09/30/17 105/73  09/13/17 (!) 168/90  09/09/17 (!) 156/73     Wt Readings from Last 3 Encounters:  09/30/17 199 lb (90.3 kg)  09/13/17 201 lb 9.6 oz (91.4 kg)  09/08/17 198 lb (89.8 kg)     Physical Exam Pt is alert and oriented PERRL EOMI HEART  IS RRR no murmur or rub LCTA no wheezing or rales MUSCULOSKELETAL feels some paraspinous muscle tenderness in the lumbar region but no overt trigger points.  She is ambulating with an antalgic gait but seems to be getting around well her muscle tone and bulk is at baseline.  Labs  No results found for: HGBA1C Lab Results  Component Value Date   CREATININE 0.72 09/13/2017    -------------------------------------------------------------------------------------------------------------------- Lab Results  Component Value Date   WBC 9.8 09/08/2017   HGB 11.8 (L) 09/08/2017   HCT 35.3 (L) 09/08/2017   PLT 189 09/08/2017   GLUCOSE 98 09/13/2017   ALT 20 06/03/2017   AST 22 06/03/2017   NA 138 09/13/2017   K 3.4 (L) 09/13/2017   CL 100 09/13/2017   CREATININE 0.72 09/13/2017   BUN 11 09/13/2017   CO2 30 09/13/2017   TSH 1.260 06/03/2017   INR 1.0 06/03/2017    --------------------------------------------------------------------------------------------------------------------- Dg Chest 2 View  Result Date: 09/13/2017 CLINICAL DATA:  Follow-up pneumonia. No complaints. History of asthma, emphysema, hypertension. Previous smoker. EXAM: CHEST - 2 VIEW COMPARISON:  09/08/2017 FINDINGS: Mild cardiac enlargement. Pulmonary vascularity is normal. Emphysematous changes in the lungs. Slight interstitial pattern to the lungs is similar to previous study and probably represents mild fibrosis or chronic bronchitic changes. No focal consolidation or airspace disease. No blunting of costophrenic angles. No pneumothorax. Mediastinal contours appear intact. Calcification of the aorta. IMPRESSION: Emphysematous and chronic bronchitic changes in the lungs. No evidence of active pulmonary disease. Electronically Signed   By: Lucienne Capers M.D.   On: 09/13/2017 23:04     Assessment & Plan:   Mileigh was seen today for back pain.  Diagnoses and all orders for this visit:  DDD (degenerative disc  disease), lumbar -     Lumbar Epidural Injection; Future  Bilateral sciatica -     Lumbar Epidural Injection; Future  Lumbar spondylosis with myelopathy -     Lumbar Epidural Injection; Future  Chronic pain syndrome  Chronic, continuous use of opioids  Facet arthritis of lumbosacral region  Hip pain, acute, left  Other orders -     Discontinue: HYDROcodone-acetaminophen (NORCO) 7.5-325 MG tablet; Take 1 tablet by mouth 3 (three) times daily. -     HYDROcodone-acetaminophen (NORCO) 7.5-325 MG tablet; Take 1 tablet by mouth 3 (three) times daily. -     methocarbamol (ROBAXIN) 500 MG tablet; Take 1 tablet (500 mg total) by mouth 4 (four) times daily.        ----------------------------------------------------------------------------------------------------------------------  Problem List Items Addressed This Visit    None    Visit Diagnoses    DDD (degenerative disc disease), lumbar    -  Primary   Relevant  Medications   HYDROcodone-acetaminophen (NORCO) 7.5-325 MG tablet   methocarbamol (ROBAXIN) 500 MG tablet   Other Relevant Orders   Lumbar Epidural Injection   Bilateral sciatica       Relevant Medications   methocarbamol (ROBAXIN) 500 MG tablet   Other Relevant Orders   Lumbar Epidural Injection   Lumbar spondylosis with myelopathy       Relevant Orders   Lumbar Epidural Injection   Chronic pain syndrome       Chronic, continuous use of opioids       Facet arthritis of lumbosacral region       Relevant Medications   HYDROcodone-acetaminophen (NORCO) 7.5-325 MG tablet   methocarbamol (ROBAXIN) 500 MG tablet   Hip pain, acute, left            ----------------------------------------------------------------------------------------------------------------------  1. DDD (degenerative disc disease), lumbar We will schedule her for repeat epidural in 1 month.  We have gone over the risks and benefits of the procedure with her in full detail.  In the meantime she  is to continue with her physical therapy stretching strengthening regimen. - Lumbar Epidural Injection; Future  2. Bilateral sciatica As above - Lumbar Epidural Injection; Future  3. Lumbar spondylosis with myelopathy As above - Lumbar Epidural Injection; Future  4. Chronic pain syndrome We have reviewed the [facility] practitioner database information and it is appropriate.  We will give her refills for August 8 and September 7 for her Vicodin 7.5 mg tablets.  She is to continue with her Carmol 4 times daily as needed as necessary.  5. Chronic, continuous use of opioids   6. Facet arthritis of lumbosacral region   7. Hip pain, acute, left     ----------------------------------------------------------------------------------------------------------------------  I am having Beckie Busing. Schemm maintain her Calcium Carbonate-Vitamin D, Multiple Vitamin (MULTI-VITAMIN PO), DULoxetine, albuterol, Ospemifene, Omega-3 Fatty Acids (FISH OIL PO), valsartan-hydrochlorothiazide, PREMARIN, LORazepam, clobetasol cream, fluocinonide, omeprazole, meloxicam, Fluticasone-Umeclidin-Vilant, HYDROcodone-acetaminophen, and methocarbamol. We will continue to administer betamethasone acetate-betamethasone sodium phosphate.   Meds ordered this encounter  Medications  . DISCONTD: HYDROcodone-acetaminophen (NORCO) 7.5-325 MG tablet    Sig: Take 1 tablet by mouth 3 (three) times daily.    Dispense:  90 tablet    Refill:  0    Do not fill until [id number]  . HYDROcodone-acetaminophen (NORCO) 7.5-325 MG tablet    Sig: Take 1 tablet by mouth 3 (three) times daily.    Dispense:  90 tablet    Refill:  0    Do not fill until [id number]  . methocarbamol (ROBAXIN) 500 MG tablet    Sig: Take 1 tablet (500 mg total) by mouth 4 (four) times daily.    Dispense:  120 tablet    Refill:  2   Patient's Medications  New Prescriptions   No medications on file  Previous Medications   ALBUTEROL (PROAIR HFA) 108  (90 BASE) MCG/ACT INHALER    Inhale 2 puffs into the lungs every 6 (six) hours as needed for shortness of breath.    CALCIUM CARBONATE-VITAMIN D (CALCIUM + D) 600-200 MG-UNIT TABS    Take 1 tablet by mouth daily.    CLOBETASOL CREAM (TEMOVATE) 0.05 %    Apply 1 application topically 2 (two) times daily as needed (irritation).    DULOXETINE (CYMBALTA) 60 MG CAPSULE    Take 60 mg by mouth daily.   FLUOCINONIDE (LIDEX) 0.05 % EXTERNAL SOLUTION    Apply 1 application topically 2 (two) times daily as needed (skin irritation).  FLUTICASONE-UMECLIDIN-VILANT (TRELEGY ELLIPTA) 100-62.5-25 MCG/INH AEPB    Inhale 1 puff into the lungs daily.   LORAZEPAM (ATIVAN) 0.5 MG TABLET    Take 0.5 mg by mouth every 8 (eight) hours as needed for anxiety.    MELOXICAM (MOBIC) 15 MG TABLET    Take 1 tablet (15 mg total) by mouth daily.   MULTIPLE VITAMIN (MULTI-VITAMIN PO)    Take 1 tablet by mouth daily.    OMEGA-3 FATTY ACIDS (FISH OIL PO)    Take 1,000 mg by mouth daily.    OMEPRAZOLE (PRILOSEC) 40 MG CAPSULE    Take 1 capsule by mouth daily.   OSPEMIFENE (OSPHENA) 60 MG TABS    Take 60 mg by mouth every other day.    PREMARIN VAGINAL CREAM    Place 1 Applicatorful vaginally daily as needed.    VALSARTAN-HYDROCHLOROTHIAZIDE (DIOVAN-HCT) 160-12.5 MG TABLET    Take 1 tablet by mouth daily.   Modified Medications   Modified Medication Previous Medication   HYDROCODONE-ACETAMINOPHEN (NORCO) 7.5-325 MG TABLET HYDROcodone-acetaminophen (NORCO) 7.5-325 MG tablet      Take 1 tablet by mouth 3 (three) times daily.    Take 1 tablet by mouth 3 (three) times daily.   METHOCARBAMOL (ROBAXIN) 500 MG TABLET methocarbamol (ROBAXIN) 500 MG tablet      Take 1 tablet (500 mg total) by mouth 4 (four) times daily.    Take 1 tablet (500 mg total) by mouth 4 (four) times daily.  Discontinued Medications   No medications on file    ----------------------------------------------------------------------------------------------------------------------  Follow-up: Return in about 1 month (around 10/31/2017) for procedure.    Molli Barrows, MD

## 2017-09-30 NOTE — Progress Notes (Signed)
Nursing Pain Medication Assessment:  Safety precautions to be maintained throughout the outpatient stay will include: orient to surroundings, keep bed in low position, maintain call bell within reach at all times, provide assistance with transfer out of bed and ambulation.  Medication Inspection Compliance: Pill count conducted under aseptic conditions, in front of the patient. Neither the pills nor the bottle was removed from the patient's sight at any time. Once count was completed pills were immediately returned to the patient in their original bottle.  Medication: Hydrocodone/APAP Pill/Patch Count: 06 of 90 pills remain Pill/Patch Appearance: Markings consistent with prescribed medication Bottle Appearance: Standard pharmacy container. Clearly labeled. Filled Date: 07 / 10 / 2019 Last Medication intake:  Today

## 2017-11-08 ENCOUNTER — Ambulatory Visit
Admission: RE | Admit: 2017-11-08 | Discharge: 2017-11-08 | Disposition: A | Discharge status: Home / Self Care | Payer: Medicare Other | Source: Ambulatory Visit | Attending: Anesthesiology | Admitting: Anesthesiology

## 2017-11-08 ENCOUNTER — Ambulatory Visit: Payer: Medicare Other | Admitting: Anesthesiology

## 2017-11-08 ENCOUNTER — Other Ambulatory Visit: Payer: Self-pay | Admitting: Anesthesiology

## 2017-11-08 ENCOUNTER — Encounter: Payer: Self-pay | Admitting: Anesthesiology

## 2017-11-08 DIAGNOSIS — R52 Pain, unspecified: Secondary | ICD-10-CM

## 2017-11-08 MED ORDER — LIDOCAINE HCL (PF) 1 % IJ SOLN
INTRAMUSCULAR | Status: AC
  Filled 2017-11-08: qty 5

## 2017-11-08 MED ORDER — IOPAMIDOL (ISOVUE-M 200) INJECTION 41%
INTRAMUSCULAR | Status: AC
Start: 2017-11-08 — End: ?
  Filled 2017-11-08: qty 10

## 2017-11-08 MED ORDER — ROPIVACAINE HCL 2 MG/ML IJ SOLN
INTRAMUSCULAR | Status: AC
  Filled 2017-11-08: qty 10

## 2017-11-08 MED ORDER — SODIUM CHLORIDE 0.9 % IJ SOLN
INTRAMUSCULAR | Status: AC
  Filled 2017-11-08: qty 20

## 2017-11-10 ENCOUNTER — Ambulatory Visit
Admission: RE | Admit: 2017-11-10 | Discharge: 2017-11-10 | Disposition: A | Discharge status: Home / Self Care | Payer: Medicare Other | Source: Ambulatory Visit | Attending: Anesthesiology | Admitting: Anesthesiology

## 2017-11-10 ENCOUNTER — Other Ambulatory Visit: Payer: Self-pay | Admitting: Anesthesiology

## 2017-11-10 ENCOUNTER — Encounter: Payer: Self-pay | Admitting: Anesthesiology

## 2017-11-10 ENCOUNTER — Ambulatory Visit: Payer: Medicare Other | Admitting: Anesthesiology

## 2017-11-10 ENCOUNTER — Other Ambulatory Visit: Payer: Self-pay

## 2017-11-10 VITALS — BP 134/81 | HR 75 | Temp 98.2°F | Resp 16 | Ht 66.5 in | Wt 190.0 lb

## 2017-11-10 DIAGNOSIS — F1190 Opioid use, unspecified, uncomplicated: Secondary | ICD-10-CM

## 2017-11-10 DIAGNOSIS — G894 Chronic pain syndrome: Secondary | ICD-10-CM | POA: Diagnosis not present

## 2017-11-10 DIAGNOSIS — M5136 Other intervertebral disc degeneration, lumbar region: Secondary | ICD-10-CM | POA: Insufficient documentation

## 2017-11-10 DIAGNOSIS — Z791 Long term (current) use of non-steroidal anti-inflammatories (NSAID): Secondary | ICD-10-CM | POA: Insufficient documentation

## 2017-11-10 DIAGNOSIS — R52 Pain, unspecified: Secondary | ICD-10-CM

## 2017-11-10 DIAGNOSIS — F419 Anxiety disorder, unspecified: Secondary | ICD-10-CM | POA: Insufficient documentation

## 2017-11-10 DIAGNOSIS — M5442 Lumbago with sciatica, left side: Secondary | ICD-10-CM | POA: Diagnosis not present

## 2017-11-10 DIAGNOSIS — M5431 Sciatica, right side: Secondary | ICD-10-CM

## 2017-11-10 DIAGNOSIS — Z79899 Other long term (current) drug therapy: Secondary | ICD-10-CM | POA: Diagnosis not present

## 2017-11-10 DIAGNOSIS — Z79891 Long term (current) use of opiate analgesic: Secondary | ICD-10-CM | POA: Insufficient documentation

## 2017-11-10 DIAGNOSIS — M5441 Lumbago with sciatica, right side: Secondary | ICD-10-CM | POA: Diagnosis not present

## 2017-11-10 DIAGNOSIS — M5432 Sciatica, left side: Secondary | ICD-10-CM

## 2017-11-10 DIAGNOSIS — M47817 Spondylosis without myelopathy or radiculopathy, lumbosacral region: Secondary | ICD-10-CM

## 2017-11-10 DIAGNOSIS — M545 Low back pain: Secondary | ICD-10-CM | POA: Diagnosis present

## 2017-11-10 DIAGNOSIS — M51369 Other intervertebral disc degeneration, lumbar region without mention of lumbar back pain or lower extremity pain: Secondary | ICD-10-CM

## 2017-11-10 DIAGNOSIS — M4716 Other spondylosis with myelopathy, lumbar region: Secondary | ICD-10-CM

## 2017-11-10 MED ORDER — HYDROCODONE-ACETAMINOPHEN 7.5-325 MG PO TABS: 1.0000 | ORAL_TABLET | Freq: Three times a day (TID) | ORAL | 0 refills | Status: DC

## 2017-11-10 MED ORDER — TRIAMCINOLONE ACETONIDE 40 MG/ML IJ SUSP
40.0000 mg | Freq: Once | INTRAMUSCULAR | Status: AC
  Administered 2017-11-10: 40 mg

## 2017-11-10 MED ORDER — IOPAMIDOL (ISOVUE-M 200) INJECTION 41%
20.0000 mL | Freq: Once | INTRAMUSCULAR | Status: DC | PRN
  Administered 2017-11-10: 10 mL
  Filled 2017-11-10: qty 20

## 2017-11-10 MED ORDER — SODIUM CHLORIDE 0.9 % IJ SOLN
INTRAMUSCULAR | Status: AC
  Filled 2017-11-10: qty 10

## 2017-11-10 MED ORDER — IOPAMIDOL (ISOVUE-M 200) INJECTION 41%
INTRAMUSCULAR | Status: AC
  Filled 2017-11-10: qty 10

## 2017-11-10 MED ORDER — LIDOCAINE HCL (PF) 1 % IJ SOLN
INTRAMUSCULAR | Status: AC
  Filled 2017-11-10: qty 10

## 2017-11-10 MED ORDER — ROPIVACAINE HCL 2 MG/ML IJ SOLN
INTRAMUSCULAR | Status: AC
  Filled 2017-11-10: qty 10

## 2017-11-10 MED ORDER — TRIAMCINOLONE ACETONIDE 40 MG/ML IJ SUSP
INTRAMUSCULAR | Status: AC
  Filled 2017-11-10: qty 1

## 2017-11-10 MED ORDER — LIDOCAINE HCL (PF) 1 % IJ SOLN
5.0000 mL | Freq: Once | INTRAMUSCULAR | Status: AC
  Administered 2017-11-10: 10 mL via SUBCUTANEOUS

## 2017-11-10 MED ORDER — SODIUM CHLORIDE 0.9% FLUSH
10.0000 mL | Freq: Once | INTRAVENOUS | Status: AC
  Administered 2017-11-10: 10 mL

## 2017-11-10 MED ORDER — ROPIVACAINE HCL 2 MG/ML IJ SOLN
10.0000 mL | Freq: Once | INTRAMUSCULAR | Status: AC
  Administered 2017-11-10: 1 mL via EPIDURAL

## 2017-11-10 NOTE — Progress Notes (Signed)
Nursing Pain Medication Assessment:  Safety precautions to be maintained throughout the outpatient stay will include: orient to surroundings, keep bed in low position, maintain call bell within reach at all times, provide assistance with transfer out of bed and ambulation.  Medication Inspection Compliance: Pill count conducted under aseptic conditions, in front of the patient. Neither the pills nor the bottle was removed from the patient's sight at any time. Once count was completed pills were immediately returned to the patient in their original bottle.  Medication: Hydrocodone/APAP Pill/Patch Count: 61.5 of 90 pills remain Pill/Patch Appearance: Markings consistent with prescribed medication Bottle Appearance: Standard pharmacy container. Clearly labeled. Filled Date: 77 / 8 / 2019 Last Medication intake:  Today

## 2017-11-10 NOTE — Progress Notes (Signed)
Subjective:  Patient ID: Kelsey Diaz, female    DOB: 12-27-1945  Age: 72 y.o. MRN: [id number]  CC: Back Pain (lower)   Procedure: L4-5 epidural steroid under fluoroscopic guidance without sedation  HPI Kelsey Diaz presents for reevaluation.  She was last seen in August with a previous epidural in May of this year.  She generally gets approximately 70% relief lasting several months with the epidural injections.  She has had recurrence of the same quality characteristic and distribution of pain as previously documented.  This seems to be preferential to the right lower back and right posterior lateral leg.  No change in bowel or bladder function is noted at this time.  She is taking her medications as prescribed and getting good relief from these.  Based on the narcotic assessment sheet she continues to derive good functional lifestyle improvement with these medications.  No side effects are noted.  Unfortunately she has failed conservative therapy with physical therapy and other conservative approaches.  The epidurals keep her pain under reasonable control and she has these periodically for her low back pain and sciatica.  Outpatient Medications Prior to Visit  Medication Sig Dispense Refill  . albuterol (PROAIR HFA) 108 (90 BASE) MCG/ACT inhaler Inhale 2 puffs into the lungs every 6 (six) hours as needed for shortness of breath.     . Calcium Carbonate-Vitamin D (CALCIUM + D) 600-200 MG-UNIT TABS Take 1 tablet by mouth daily.     . clobetasol cream (TEMOVATE) 7.12 % Apply 1 application topically 2 (two) times daily as needed (irritation).     . DULoxetine (CYMBALTA) 60 MG capsule Take 60 mg by mouth daily.    . fluocinonide (LIDEX) 0.05 % external solution Apply 1 application topically 2 (two) times daily as needed (skin irritation).     . Fluticasone-Umeclidin-Vilant (TRELEGY ELLIPTA) 100-62.5-25 MCG/INH AEPB Inhale 1 puff into the lungs daily. 1 each 6  . LORazepam (ATIVAN) 0.5 MG  tablet Take 0.5 mg by mouth every 8 (eight) hours as needed for anxiety.     . meloxicam (MOBIC) 15 MG tablet Take 1 tablet (15 mg total) by mouth daily.    . methocarbamol (ROBAXIN) 500 MG tablet Take 1 tablet (500 mg total) by mouth 4 (four) times daily. 120 tablet 2  . Multiple Vitamin (MULTI-VITAMIN PO) Take 1 tablet by mouth daily.     . Omega-3 Fatty Acids (FISH OIL PO) Take 1,000 mg by mouth daily.     Marland Kitchen omeprazole (PRILOSEC) 40 MG capsule Take 1 capsule by mouth daily.  1  . Ospemifene (OSPHENA) 60 MG TABS Take 60 mg by mouth every other day.     Marland Kitchen PREMARIN vaginal cream Place 1 Applicatorful vaginally daily as needed.     . valsartan-hydrochlorothiazide (DIOVAN-HCT) 160-12.5 MG tablet Take 1 tablet by mouth daily.     Marland Kitchen HYDROcodone-acetaminophen (NORCO) 7.5-325 MG tablet Take 1 tablet by mouth 3 (three) times daily. 90 tablet 0   Facility-Administered Medications Prior to Visit  Medication Dose Route Frequency Provider Last Rate Last Dose  . betamethasone acetate-betamethasone sodium phosphate (CELESTONE) injection 3 mg  3 mg Intramuscular Once Edrick Kins, DPM        Review of Systems CNS: No confusion or sedation Cardiac: No angina or palpitations GI: No abdominal pain or constipation Constitutional: No nausea vomiting fevers or chills  Objective:  BP 134/81   Pulse 75   Temp 98.2 F (36.8 C)   Resp 16  Ht 5' 6.5" (1.689 m)   Wt 190 lb (86.2 kg)   SpO2 92%   BMI 30.21 kg/m    BP Readings from Last 3 Encounters:  11/10/17 134/81  11/08/17 124/84  09/30/17 105/73     Wt Readings from Last 3 Encounters:  11/10/17 190 lb (86.2 kg)  11/08/17 190 lb (86.2 kg)  09/30/17 199 lb (90.3 kg)     Physical Exam Pt is alert and oriented PERRL EOMI HEART IS RRR no murmur or rub LCTA no wheezing or rales MUSCULOSKELETAL reveals a positive straight leg raise on the right side negative on the left her muscle tone and bulk is at baseline and she is ambulating with a  mildly antalgic gait.  Labs  No results found for: HGBA1C Lab Results  Component Value Date   CREATININE 0.72 09/13/2017    -------------------------------------------------------------------------------------------------------------------- Lab Results  Component Value Date   WBC 9.8 09/08/2017   HGB 11.8 (L) 09/08/2017   HCT 35.3 (L) 09/08/2017   PLT 189 09/08/2017   GLUCOSE 98 09/13/2017   ALT 20 06/03/2017   AST 22 06/03/2017   NA 138 09/13/2017   K 3.4 (L) 09/13/2017   CL 100 09/13/2017   CREATININE 0.72 09/13/2017   BUN 11 09/13/2017   CO2 30 09/13/2017   TSH 1.260 06/03/2017   INR 1.0 06/03/2017    --------------------------------------------------------------------------------------------------------------------- Dg C-arm 1-60 Min-no Report  Result Date: 11/10/2017 Fluoroscopy was utilized by the requesting physician.  No radiographic interpretation.     Assessment & Plan:   Kelsey Diaz was seen today for back pain.  Diagnoses and all orders for this visit:  DDD (degenerative disc disease), lumbar -     Lumbar Epidural Injection; Future  Bilateral sciatica -     Lumbar Epidural Injection; Future  Lumbar spondylosis with myelopathy -     Lumbar Epidural Injection; Future  Chronic pain syndrome  Chronic, continuous use of opioids  Facet arthritis of lumbosacral region  Sciatica, right side  Other orders -     Discontinue: HYDROcodone-acetaminophen (NORCO) 7.5-325 MG tablet; Take 1 tablet by mouth 3 (three) times daily. -     HYDROcodone-acetaminophen (NORCO) 7.5-325 MG tablet; Take 1 tablet by mouth 3 (three) times daily. -     triamcinolone acetonide (KENALOG-40) injection 40 mg -     sodium chloride flush (NS) 0.9 % injection 10 mL -     ropivacaine (PF) 2 mg/mL (0.2%) (NAROPIN) injection 10 mL -     lidocaine (PF) (XYLOCAINE) 1 % injection 5 mL -     iopamidol (ISOVUE-M) 41 % intrathecal injection 20  mL        ----------------------------------------------------------------------------------------------------------------------  Problem List Items Addressed This Visit    None    Visit Diagnoses    DDD (degenerative disc disease), lumbar    -  Primary   Relevant Medications   HYDROcodone-acetaminophen (NORCO) 7.5-325 MG tablet   triamcinolone acetonide (KENALOG-40) injection 40 mg (Completed)   Other Relevant Orders   Lumbar Epidural Injection   Bilateral sciatica       Relevant Orders   Lumbar Epidural Injection   Lumbar spondylosis with myelopathy       Relevant Orders   Lumbar Epidural Injection   Chronic pain syndrome       Chronic, continuous use of opioids       Facet arthritis of lumbosacral region       Relevant Medications   HYDROcodone-acetaminophen (NORCO) 7.5-325 MG tablet   triamcinolone acetonide (KENALOG-40)  injection 40 mg (Completed)   Sciatica, right side            ----------------------------------------------------------------------------------------------------------------------  1. DDD (degenerative disc disease), lumbar We will proceed with a repeat epidural injection today.  We have gone over the risks and benefits of the procedure with her in full detail all questions been answered.  Also refill her medications today for the next 2 months with return to clinic in 1 month for possible repeat injection at that time.  In the meantime she is to continue with core stretching strengthening exercises as once again discussed and reviewed today. - Lumbar Epidural Injection; Future  2. Bilateral sciatica As above - Lumbar Epidural Injection; Future  3. Lumbar spondylosis with myelopathy As above - Lumbar Epidural Injection; Future  4. Chronic pain syndrome We reviewed the [facility] practitioner database information and it is appropriate.  Vicodin 7.5 mg tablets will be prescribed for October 7 and November 6.  5. Chronic, continuous use of  opioids As above  6. Facet arthritis of lumbosacral region To new core stretching and strengthening exercises  7. Sciatica, right side     ----------------------------------------------------------------------------------------------------------------------  I am having Kelsey Diaz. Gray maintain her Calcium Carbonate-Vitamin D, Multiple Vitamin (MULTI-VITAMIN PO), DULoxetine, albuterol, Ospemifene, Omega-3 Fatty Acids (FISH OIL PO), valsartan-hydrochlorothiazide, PREMARIN, LORazepam, clobetasol cream, fluocinonide, omeprazole, meloxicam, Fluticasone-Umeclidin-Vilant, methocarbamol, and HYDROcodone-acetaminophen. We administered triamcinolone acetonide, sodium chloride flush, ropivacaine (PF) 2 mg/mL (0.2%), lidocaine (PF), and iopamidol. We will continue to administer betamethasone acetate-betamethasone sodium phosphate.   Meds ordered this encounter  Medications  . DISCONTD: HYDROcodone-acetaminophen (NORCO) 7.5-325 MG tablet    Sig: Take 1 tablet by mouth 3 (three) times daily.    Dispense:  90 tablet    Refill:  0    Do not fill until [id number]  . HYDROcodone-acetaminophen (NORCO) 7.5-325 MG tablet    Sig: Take 1 tablet by mouth 3 (three) times daily.    Dispense:  90 tablet    Refill:  0    Do not fill until [id number]  . triamcinolone acetonide (KENALOG-40) injection 40 mg  . sodium chloride flush (NS) 0.9 % injection 10 mL  . ropivacaine (PF) 2 mg/mL (0.2%) (NAROPIN) injection 10 mL  . lidocaine (PF) (XYLOCAINE) 1 % injection 5 mL  . iopamidol (ISOVUE-M) 41 % intrathecal injection 20 mL   Patient's Medications  New Prescriptions   No medications on file  Previous Medications   ALBUTEROL (PROAIR HFA) 108 (90 BASE) MCG/ACT INHALER    Inhale 2 puffs into the lungs every 6 (six) hours as needed for shortness of breath.    CALCIUM CARBONATE-VITAMIN D (CALCIUM + D) 600-200 MG-UNIT TABS    Take 1 tablet by mouth daily.    CLOBETASOL CREAM (TEMOVATE) 0.05 %    Apply 1 application  topically 2 (two) times daily as needed (irritation).    DULOXETINE (CYMBALTA) 60 MG CAPSULE    Take 60 mg by mouth daily.   FLUOCINONIDE (LIDEX) 0.05 % EXTERNAL SOLUTION    Apply 1 application topically 2 (two) times daily as needed (skin irritation).    FLUTICASONE-UMECLIDIN-VILANT (TRELEGY ELLIPTA) 100-62.5-25 MCG/INH AEPB    Inhale 1 puff into the lungs daily.   LORAZEPAM (ATIVAN) 0.5 MG TABLET    Take 0.5 mg by mouth every 8 (eight) hours as needed for anxiety.    MELOXICAM (MOBIC) 15 MG TABLET    Take 1 tablet (15 mg total) by mouth daily.   METHOCARBAMOL (ROBAXIN) 500 MG TABLET  Take 1 tablet (500 mg total) by mouth 4 (four) times daily.   MULTIPLE VITAMIN (MULTI-VITAMIN PO)    Take 1 tablet by mouth daily.    OMEGA-3 FATTY ACIDS (FISH OIL PO)    Take 1,000 mg by mouth daily.    OMEPRAZOLE (PRILOSEC) 40 MG CAPSULE    Take 1 capsule by mouth daily.   OSPEMIFENE (OSPHENA) 60 MG TABS    Take 60 mg by mouth every other day.    PREMARIN VAGINAL CREAM    Place 1 Applicatorful vaginally daily as needed.    VALSARTAN-HYDROCHLOROTHIAZIDE (DIOVAN-HCT) 160-12.5 MG TABLET    Take 1 tablet by mouth daily.   Modified Medications   Modified Medication Previous Medication   HYDROCODONE-ACETAMINOPHEN (NORCO) 7.5-325 MG TABLET HYDROcodone-acetaminophen (NORCO) 7.5-325 MG tablet      Take 1 tablet by mouth 3 (three) times daily.    Take 1 tablet by mouth 3 (three) times daily.  Discontinued Medications   No medications on file   ----------------------------------------------------------------------------------------------------------------------  Follow-up: No follow-ups on file.   Procedure: L4-L5 LESI with fluoroscopic guidance and no moderate sedation  NOTE: The risks, benefits, and expectations of the procedure have been discussed and explained to the patient who was understanding and in agreement with suggested treatment plan. No guarantees were made.  DESCRIPTION OF PROCEDURE: Lumbar epidural  steroid injection with no IV Versed, EKG, blood pressure, pulse, and pulse oximetry monitoring. The procedure was performed with the patient in the prone position under fluoroscopic guidance.  Sterile prep x3 was initiated and I then injected subcutaneous lidocaine to the overlying L4-5 site after its fluoroscopic identifictation.  Using strict aseptic technique, I then advanced an 18-gauge Tuohy epidural needle in the midline using interlaminar approach via loss-of-resistance to saline technique. There was negative aspiration for heme or  CSF.  I then confirmed position with both AP and Lateral fluoroscan.  2 cc of Isovue were injected and a  total of 5 mL of Preservative-Free normal saline mixed with 40 mg of Kenalog and 1cc Ropicaine 0.2 percent were injected incrementally via the  epidurally placed needle. The needle was removed. The patient tolerated the injection well and was convalesced and discharged to home in stable condition. Should the patient have any post procedure difficulty they have been instructed on how to contact us for assistance.    Molli Barrows, MD

## 2017-11-10 NOTE — Patient Instructions (Signed)
Epidural Steroid Injection Patient Information  Description: The epidural space surrounds the nerves as they exit the spinal cord.  In some patients, the nerves can be compressed and inflamed by a bulging disc or a tight spinal canal (spinal stenosis).  By injecting steroids into the epidural space, we can bring irritated nerves into direct contact with a potentially helpful medication.  These steroids act directly on the irritated nerves and can reduce swelling and inflammation which often leads to decreased pain.  Epidural steroids may be injected anywhere along the spine and from the neck to the low back depending upon the location of your pain.   After numbing the skin with local anesthetic (like Novocaine), a small needle is passed into the epidural space slowly.  You may experience a sensation of pressure while this is being done.  The entire block usually last less than 10 minutes.  Conditions which may be treated by epidural steroids:   Low back and leg pain  Neck and arm pain  Spinal stenosis  Post-laminectomy syndrome  Herpes zoster (shingles) pain  Pain from compression fractures  Preparation for the injection:  1. Do not eat any solid food or dairy products within 8 hours of your appointment.  2. You may drink clear liquids up to 3 hours before appointment.  Clear liquids include water, black coffee, juice or soda.  No milk or cream please. 3. You may take your regular medication, including pain medications, with a sip of water before your appointment  Diabetics should hold regular insulin (if taken separately) and take 1/2 normal NPH dos the morning of the procedure.  Carry some sugar containing items with you to your appointment. 4. A driver must accompany you and be prepared to drive you home after your procedure.  5. Bring all your current medications with your. 6. An IV may be inserted and sedation may be given at the discretion of the physician.   7. A blood pressure  cuff, EKG and other monitors will often be applied during the procedure.  Some patients may need to have extra oxygen administered for a short period. 8. You will be asked to provide medical information, including your allergies, prior to the procedure.  We must know immediately if you are taking blood thinners (like Coumadin/Warfarin)  Or if you are allergic to IV iodine contrast (dye). We must know if you could possible be pregnant.  Possible side-effects:  Bleeding from needle site  Infection (rare, may require surgery)  Nerve injury (rare)  Numbness & tingling (temporary)  Difficulty urinating (rare, temporary)  Spinal headache ( a headache worse with upright posture)  Light -headedness (temporary)  Pain at injection site (several days)  Decreased blood pressure (temporary)  Weakness in arm/leg (temporary)  Pressure sensation in back/neck (temporary)  Call if you experience:  Fever/chills associated with headache or increased back/neck pain.  Headache worsened by an upright position.  New onset weakness or numbness of an extremity below the injection site  Hives or difficulty breathing (go to the emergency room)  Inflammation or drainage at the infection site  Severe back/neck pain  Any new symptoms which are concerning to you  Please note:  Although the local anesthetic injected can often make your back or neck feel good for several hours after the injection, the pain will likely return.  It takes 3-7 days for steroids to work in the epidural space.  You may not notice any pain relief for at least that one week.    If effective, we will often do a series of three injections spaced 3-6 weeks apart to maximally decrease your pain.  After the initial series, we generally will wait several months before considering a repeat injection of the same type.  If you have any questions, please call [phone] [facility]  Discharge Instructions  General Discharge Instructions :  If you need to reach your doctor call: Monday-Friday 8:00 am - 4:00 pm at [phone] or toll free [phone].  After clinic hours [phone] to have operator reach doctor.  Bring all of your medication bottles to all your appointments in the pain clinic.  To cancel or reschedule your appointment with Pain Management please remember to call 24 hours in advance to avoid a fee.  Refer to the educational materials which you have been given on: General Risks, I had my Procedure. Discharge Instructions, Post Sedation.  Post Procedure Instructions:  The drugs you were given will stay in your system until tomorrow, so for the next 24 hours you should not drive, make any legal decisions or drink any alcoholic beverages.  You may eat anything you prefer, but it is better to start with liquids then soups and crackers, and gradually work up to solid foods.  Please notify your doctor immediately if you have any unusual bleeding, trouble breathing or pain that is not related to your normal pain.  Depending on the type of procedure that was done, some parts of your body may feel week and/or numb.  This usually clears up by tonight or the next day.  Walk with the use of an assistive device or accompanied by an adult for the 24 hours.  You may use ice on the affected area for the first 24 hours.  Put ice in a Ziploc bag and cover with a towel and place against area 15 minutes on 15 minutes off.  You may switch to heat after 24 hours. 

## 2017-11-11 ENCOUNTER — Telehealth: Payer: Self-pay | Admitting: *Deleted

## 2017-11-11 NOTE — Telephone Encounter (Signed)
No problems post procedure. 

## 2017-12-10 ENCOUNTER — Ambulatory Visit: Payer: Medicare Other | Admitting: Podiatry

## 2017-12-14 ENCOUNTER — Ambulatory Visit: Payer: Medicare Other | Admitting: Podiatry

## 2017-12-14 DIAGNOSIS — M7751 Other enthesopathy of right foot: Secondary | ICD-10-CM | POA: Diagnosis not present

## 2017-12-14 NOTE — Progress Notes (Signed)
   HPI: 72 year old female presenting today for follow up evaluation of right second toe pain. She states she is experiencing continued pain. She reports some temporary relief after receiving the injection at her last appointment and would like another one. She has been using her custom orthotics which help alleviate the pain somewhat. Patient is here for further evaluation and treatment.   Past Medical History:  Diagnosis Date  . Allergic rhinitis   . Anxiety   . Asthma   . Colitis   . COPD (chronic obstructive pulmonary disease) ([place])   . Fibromyalgia   . GERD (gastroesophageal reflux disease)    With esophageal strictures  . Hypertension   . Hyperthyroidism   . OSA (obstructive sleep apnea) 02/09/2012   No mask.  Did not tolerate  . Osteoarthritis   . Pneumonia 08/2017     Physical Exam: General: The patient is alert and oriented x3 in no acute distress.  Dermatology: Skin is warm, dry and supple bilateral lower extremities. Negative for open lesions or macerations.  Vascular: Palpable pedal pulses bilaterally. No edema or erythema noted. Capillary refill within normal limits.  Neurological: Epicritic and protective threshold grossly intact bilaterally.   Musculoskeletal Exam: Range of motion within normal limits to all pedal and ankle joints bilateral. Muscle strength 5/5 in all groups bilateral.   Assessment: 1. Capsulitis right 2nd toe   Plan of Care:  1. Patient evaluated. X-Rays reviewed.  2. Injection of 0.5 mLs Celestone Soluspan injected into the 2nd MPJ of the right foot.  3. Continue using custom orthotics and good shoe gear.  4. Return to clinic as needed.      Edrick Kins, DPM [facility]  Dr. Edrick Kins, DPM    [address], [place] [address]                Office [phone]  Fax [phone]

## 2017-12-28 ENCOUNTER — Ambulatory Visit
Admission: RE | Admit: 2017-12-28 | Discharge: 2017-12-28 | Disposition: A | Discharge status: Home / Self Care | Payer: Medicare Other | Source: Ambulatory Visit | Attending: Family Medicine | Admitting: Family Medicine

## 2017-12-28 ENCOUNTER — Other Ambulatory Visit: Payer: Self-pay | Admitting: Family Medicine

## 2017-12-28 DIAGNOSIS — S20212A Contusion of left front wall of thorax, initial encounter: Secondary | ICD-10-CM

## 2017-12-30 ENCOUNTER — Telehealth: Payer: Self-pay

## 2017-12-30 NOTE — Telephone Encounter (Signed)
Pt called and stated that she fell and broke some ribs and the dr that evaluated her is sending over x-rays. Pt wanted dr Andree Elk to increase her pain medicine dose for acute pain.

## 2017-12-30 NOTE — Telephone Encounter (Signed)
Advised to have PCP treat fx ribs. It is not a violation of contract to receive opioid analgesics for acute condition.

## 2018-01-25 ENCOUNTER — Other Ambulatory Visit: Payer: Self-pay

## 2018-01-25 ENCOUNTER — Encounter: Payer: Self-pay | Admitting: Anesthesiology

## 2018-01-25 ENCOUNTER — Ambulatory Visit: Payer: Medicare Other | Attending: Anesthesiology | Admitting: Anesthesiology

## 2018-01-25 VITALS — BP 135/77 | HR 82 | Temp 98.0°F | Resp 16 | Ht 66.0 in | Wt 199.0 lb

## 2018-01-25 DIAGNOSIS — M5432 Sciatica, left side: Secondary | ICD-10-CM | POA: Diagnosis not present

## 2018-01-25 DIAGNOSIS — M5431 Sciatica, right side: Secondary | ICD-10-CM | POA: Insufficient documentation

## 2018-01-25 DIAGNOSIS — M4716 Other spondylosis with myelopathy, lumbar region: Secondary | ICD-10-CM | POA: Insufficient documentation

## 2018-01-25 DIAGNOSIS — M545 Low back pain, unspecified: Secondary | ICD-10-CM

## 2018-01-25 DIAGNOSIS — G894 Chronic pain syndrome: Secondary | ICD-10-CM | POA: Diagnosis present

## 2018-01-25 DIAGNOSIS — Z79891 Long term (current) use of opiate analgesic: Secondary | ICD-10-CM | POA: Insufficient documentation

## 2018-01-25 DIAGNOSIS — M5136 Other intervertebral disc degeneration, lumbar region: Secondary | ICD-10-CM | POA: Diagnosis not present

## 2018-01-25 DIAGNOSIS — M47817 Spondylosis without myelopathy or radiculopathy, lumbosacral region: Secondary | ICD-10-CM | POA: Diagnosis not present

## 2018-01-25 DIAGNOSIS — F1190 Opioid use, unspecified, uncomplicated: Secondary | ICD-10-CM

## 2018-01-25 MED ORDER — HYDROCODONE-ACETAMINOPHEN 7.5-325 MG PO TABS: 1.0000 | ORAL_TABLET | Freq: Three times a day (TID) | ORAL | 0 refills | Status: DC

## 2018-01-25 MED ORDER — DEXAMETHASONE SODIUM PHOSPHATE 10 MG/ML IJ SOLN
INTRAMUSCULAR | Status: AC
  Filled 2018-01-25: qty 1

## 2018-01-25 MED ORDER — ROPIVACAINE HCL 2 MG/ML IJ SOLN
INTRAMUSCULAR | Status: AC
  Filled 2018-01-25: qty 10

## 2018-01-25 MED ORDER — ROPIVACAINE HCL 2 MG/ML IJ SOLN
10.0000 mL | Freq: Once | INTRAMUSCULAR | Status: AC
  Administered 2018-01-25: 10 mL via EPIDURAL

## 2018-01-25 MED ORDER — DEXAMETHASONE SODIUM PHOSPHATE 10 MG/ML IJ SOLN
10.0000 mg | Freq: Once | INTRAMUSCULAR | Status: AC
  Administered 2018-01-25: 10 mg

## 2018-01-25 NOTE — Progress Notes (Signed)
Nursing Pain Medication Assessment:  Safety precautions to be maintained throughout the outpatient stay will include: orient to surroundings, keep bed in low position, maintain call bell within reach at all times, provide assistance with transfer out of bed and ambulation.  Medication Inspection Compliance: Pill count conducted under aseptic conditions, in front of the patient. Neither the pills nor the bottle was removed from the patient's sight at any time. Once count was completed pills were immediately returned to the patient in their original bottle.  Medication #1: Oxycodone/APAP Pill/Patch Count: 17 of 20 pills remain Pill/Patch Appearance: Markings consistent with prescribed medication Bottle Appearance: Standard pharmacy container. Clearly labeled. Filled Date: 3 / 7 / 2019 Last Medication intake:  greater than 1 week  Medication #2: Hydrocodone/APAP Pill/Patch Count: 17.5 of 90 pills remain Pill/Patch Appearance: Markings consistent with prescribed medication Bottle Appearance: Standard pharmacy container. Clearly labeled. Filled Date: 85 / 7/ 2019 Last Medication intake:  Today

## 2018-01-25 NOTE — Progress Notes (Signed)
Subjective:  Patient ID: Kelsey Diaz, female    DOB: 1945-06-11  Age: 72 y.o. MRN: [id number]  CC: Back Pain (low)   Procedure: Left L5 trigger point injection x2  HPI Kelsey Diaz presents for reevaluation.  She was last seen a few months ago and has been having some more pain following a recent fall.  This is primarily in the left lower back and is associated with some spasming.  She has had previous radicular pain in the past but this is less prevalent at this point.  She was recently scheduled for an epidural however had to cancel secondary to an infection.  At this point she denies change in lower extremity strength or function or bowel bladder function.  She is also taking her medications as prescribed.  She generally uses Vicodin 7.5 mg tablets 3 times a day.  Otherwise she is in her usual state of health.  Based on her narcotic assessment sheet she is continued to derive good functional lifestyle improvement with these medicines with no side effects noted.  Outpatient Medications Prior to Visit  Medication Sig Dispense Refill  . albuterol (PROAIR HFA) 108 (90 BASE) MCG/ACT inhaler Inhale 2 puffs into the lungs every 6 (six) hours as needed for shortness of breath.     . Calcium Carbonate-Vitamin D (CALCIUM + D) 600-200 MG-UNIT TABS Take 1 tablet by mouth daily.     . clobetasol cream (TEMOVATE) 9.24 % Apply 1 application topically 2 (two) times daily as needed (irritation).     . DULoxetine (CYMBALTA) 60 MG capsule Take 60 mg by mouth daily.    . fluocinonide (LIDEX) 0.05 % external solution Apply 1 application topically 2 (two) times daily as needed (skin irritation).     . Fluticasone-Umeclidin-Vilant (TRELEGY ELLIPTA) 100-62.5-25 MCG/INH AEPB Inhale 1 puff into the lungs daily. 1 each 6  . LORazepam (ATIVAN) 0.5 MG tablet Take 0.5 mg by mouth every 8 (eight) hours as needed for anxiety.     . meloxicam (MOBIC) 15 MG tablet Take 1 tablet (15 mg total) by mouth daily.    .  methocarbamol (ROBAXIN) 500 MG tablet Take 1 tablet (500 mg total) by mouth 4 (four) times daily. 120 tablet 2  . Multiple Vitamin (MULTI-VITAMIN PO) Take 1 tablet by mouth daily.     . Omega-3 Fatty Acids (FISH OIL PO) Take 1,000 mg by mouth daily.     Marland Kitchen omeprazole (PRILOSEC) 40 MG capsule Take 1 capsule by mouth daily.  1  . Ospemifene (OSPHENA) 60 MG TABS Take 60 mg by mouth every other day.     . oxyCODONE-acetaminophen (PERCOCET/ROXICET) 5-325 MG tablet Take 1 tablet by mouth every 6 (six) hours as needed.  0  . PREMARIN vaginal cream Place 1 Applicatorful vaginally daily as needed.     . valsartan-hydrochlorothiazide (DIOVAN-HCT) 160-12.5 MG tablet Take 1 tablet by mouth daily.     Marland Kitchen HYDROcodone-acetaminophen (NORCO) 7.5-325 MG tablet Take 1 tablet by mouth 3 (three) times daily. 90 tablet 0   Facility-Administered Medications Prior to Visit  Medication Dose Route Frequency Provider Last Rate Last Dose  . betamethasone acetate-betamethasone sodium phosphate (CELESTONE) injection 3 mg  3 mg Intramuscular Once Edrick Kins, DPM        Review of Systems CNS: No confusion or sedation Cardiac: No angina or palpitations GI: No abdominal pain or constipation Constitutional: No nausea vomiting fevers or chills  Objective:  BP 135/77   Pulse 82   Temp  98 F (36.7 C)   Resp 16   Ht 5\' 6"  (1.676 m)   Wt 199 lb (90.3 kg)   SpO2 98%   BMI 32.12 kg/m    BP Readings from Last 3 Encounters:  01/25/18 135/77  11/10/17 134/81  11/08/17 124/84     Wt Readings from Last 3 Encounters:  01/25/18 199 lb (90.3 kg)  11/10/17 190 lb (86.2 kg)  11/08/17 190 lb (86.2 kg)     Physical Exam Pt is alert and oriented PERRL EOMI HEART IS RRR no murmur or rub LCTA no wheezing or rales MUSCULOSKELETAL reveals a trigger point in the left paraspinous muscle lumbar region approximately 5 cm to the left of midline x2.  Muscle tone and bulk to the lower extremities is at baseline.  Labs  No  results found for: HGBA1C Lab Results  Component Value Date   CREATININE 0.72 09/13/2017    -------------------------------------------------------------------------------------------------------------------- Lab Results  Component Value Date   WBC 9.8 09/08/2017   HGB 11.8 (L) 09/08/2017   HCT 35.3 (L) 09/08/2017   PLT 189 09/08/2017   GLUCOSE 98 09/13/2017   ALT 20 06/03/2017   AST 22 06/03/2017   NA 138 09/13/2017   K 3.4 (L) 09/13/2017   CL 100 09/13/2017   CREATININE 0.72 09/13/2017   BUN 11 09/13/2017   CO2 30 09/13/2017   TSH 1.260 06/03/2017   INR 1.0 06/03/2017    --------------------------------------------------------------------------------------------------------------------- Dg Ribs Unilateral W/chest Left  Result Date: 12/28/2017 CLINICAL DATA:  Fall from bed 6 days ago.  Left upper chest pain. EXAM: LEFT RIBS AND CHEST - 3+ VIEW COMPARISON:  Radiographs 09/13/2017. FINDINGS: The heart size and mediastinal contours are stable. There is mild aortic atherosclerosis. There is stable mild biapical scarring. The lungs are otherwise clear. There is no pleural effusion or pneumothorax. There is a mildly displaced fracture of the left 3rd rib anteriorly. There is a possible nondisplaced fracture of the left 4th rib. IMPRESSION: 1. Mildly displaced fracture of the left 3rd rib anteriorly. 2. No evidence of pneumothorax or pleural effusion. Electronically Signed   By: Richardean Sale M.D.   On: 12/28/2017 16:44     Assessment & Plan:   Kelsey Diaz was seen today for back pain.  Diagnoses and all orders for this visit:  DDD (degenerative disc disease), lumbar -     Lumbar Epidural Injection; Future  Bilateral sciatica -     Lumbar Epidural Injection; Future  Lumbar spondylosis with myelopathy -     Lumbar Epidural Injection; Future  Chronic pain syndrome  Chronic, continuous use of opioids  Facet arthritis of lumbosacral region  Acute left-sided low back pain  without sciatica  Other orders -     Discontinue: HYDROcodone-acetaminophen (NORCO) 7.5-325 MG tablet; Take 1 tablet by mouth 3 (three) times daily. -     HYDROcodone-acetaminophen (NORCO) 7.5-325 MG tablet; Take 1 tablet by mouth 3 (three) times daily. -     dexamethasone (DECADRON) injection 10 mg -     ropivacaine (PF) 2 mg/mL (0.2%) (NAROPIN) injection 10 mL        ----------------------------------------------------------------------------------------------------------------------  Problem List Items Addressed This Visit    None    Visit Diagnoses    DDD (degenerative disc disease), lumbar    -  Primary   Relevant Medications   oxyCODONE-acetaminophen (PERCOCET/ROXICET) 5-325 MG tablet   HYDROcodone-acetaminophen (NORCO) 7.5-325 MG tablet   dexamethasone (DECADRON) injection 10 mg (Completed)   Other Relevant Orders   Lumbar Epidural Injection  Bilateral sciatica       Relevant Orders   Lumbar Epidural Injection   Lumbar spondylosis with myelopathy       Relevant Orders   Lumbar Epidural Injection   Chronic pain syndrome       Chronic, continuous use of opioids       Facet arthritis of lumbosacral region       Relevant Medications   oxyCODONE-acetaminophen (PERCOCET/ROXICET) 5-325 MG tablet   HYDROcodone-acetaminophen (NORCO) 7.5-325 MG tablet   dexamethasone (DECADRON) injection 10 mg (Completed)   Acute left-sided low back pain without sciatica       Relevant Medications   oxyCODONE-acetaminophen (PERCOCET/ROXICET) 5-325 MG tablet   HYDROcodone-acetaminophen (NORCO) 7.5-325 MG tablet   dexamethasone (DECADRON) injection 10 mg (Completed)        ----------------------------------------------------------------------------------------------------------------------  1. DDD (degenerative disc disease), lumbar We will defer on epidural injections today with planning on doing this in approximately 1 month.  In the meantime I am going to proceed with a trigger point  injection for the lumbar paraspinous muscle acute spasming she is having.  This is failed conservative therapy and she is tried stretching exercises without success.  I have given her some other stretches to help with this going forward. - Lumbar Epidural Injection; Future  2. Bilateral sciatica Plan on having her return to clinic in 1 month for an epidural injection at that time. - Lumbar Epidural Injection; Future  3. Lumbar spondylosis with myelopathy As above - Lumbar Epidural Injection; Future  4. Chronic pain syndrome She is to continue her current medication management.  Refills were given today for December 7 and January 6 of next year.  This is for her Vicodin 7.5 mg tablets.  We have reviewed the [facility] practitioner database information and it is appropriate.  5. Chronic, continuous use of opioids As above  6. Facet arthritis of lumbosacral region   7. Acute left-sided low back pain without sciatica     ----------------------------------------------------------------------------------------------------------------------  I am having Kelsey Diaz maintain her Calcium Carbonate-Vitamin D, Multiple Vitamin (MULTI-VITAMIN PO), DULoxetine, albuterol, Ospemifene, Omega-3 Fatty Acids (FISH OIL PO), valsartan-hydrochlorothiazide, PREMARIN, LORazepam, clobetasol cream, fluocinonide, omeprazole, meloxicam, Fluticasone-Umeclidin-Vilant, methocarbamol, oxyCODONE-acetaminophen, and HYDROcodone-acetaminophen. We administered dexamethasone and ropivacaine (PF) 2 mg/mL (0.2%). We will continue to administer betamethasone acetate-betamethasone sodium phosphate.   Meds ordered this encounter  Medications  . DISCONTD: HYDROcodone-acetaminophen (NORCO) 7.5-325 MG tablet    Sig: Take 1 tablet by mouth 3 (three) times daily.    Dispense:  90 tablet    Refill:  0    Do not fill until [id number]  . HYDROcodone-acetaminophen (NORCO) 7.5-325 MG tablet    Sig: Take 1 tablet by mouth 3  (three) times daily.    Dispense:  90 tablet    Refill:  0    Do not fill until [id number]  . dexamethasone (DECADRON) injection 10 mg  . ropivacaine (PF) 2 mg/mL (0.2%) (NAROPIN) injection 10 mL   Patient's Medications  New Prescriptions   No medications on file  Previous Medications   ALBUTEROL (PROAIR HFA) 108 (90 BASE) MCG/ACT INHALER    Inhale 2 puffs into the lungs every 6 (six) hours as needed for shortness of breath.    CALCIUM CARBONATE-VITAMIN D (CALCIUM + D) 600-200 MG-UNIT TABS    Take 1 tablet by mouth daily.    CLOBETASOL CREAM (TEMOVATE) 0.05 %    Apply 1 application topically 2 (two) times daily as needed (irritation).    DULOXETINE (CYMBALTA) 60 MG  CAPSULE    Take 60 mg by mouth daily.   FLUOCINONIDE (LIDEX) 0.05 % EXTERNAL SOLUTION    Apply 1 application topically 2 (two) times daily as needed (skin irritation).    FLUTICASONE-UMECLIDIN-VILANT (TRELEGY ELLIPTA) 100-62.5-25 MCG/INH AEPB    Inhale 1 puff into the lungs daily.   LORAZEPAM (ATIVAN) 0.5 MG TABLET    Take 0.5 mg by mouth every 8 (eight) hours as needed for anxiety.    MELOXICAM (MOBIC) 15 MG TABLET    Take 1 tablet (15 mg total) by mouth daily.   METHOCARBAMOL (ROBAXIN) 500 MG TABLET    Take 1 tablet (500 mg total) by mouth 4 (four) times daily.   MULTIPLE VITAMIN (MULTI-VITAMIN PO)    Take 1 tablet by mouth daily.    OMEGA-3 FATTY ACIDS (FISH OIL PO)    Take 1,000 mg by mouth daily.    OMEPRAZOLE (PRILOSEC) 40 MG CAPSULE    Take 1 capsule by mouth daily.   OSPEMIFENE (OSPHENA) 60 MG TABS    Take 60 mg by mouth every other day.    OXYCODONE-ACETAMINOPHEN (PERCOCET/ROXICET) 5-325 MG TABLET    Take 1 tablet by mouth every 6 (six) hours as needed.   PREMARIN VAGINAL CREAM    Place 1 Applicatorful vaginally daily as needed.    VALSARTAN-HYDROCHLOROTHIAZIDE (DIOVAN-HCT) 160-12.5 MG TABLET    Take 1 tablet by mouth daily.   Modified Medications   Modified Medication Previous Medication   HYDROCODONE-ACETAMINOPHEN  (NORCO) 7.5-325 MG TABLET HYDROcodone-acetaminophen (NORCO) 7.5-325 MG tablet      Take 1 tablet by mouth 3 (three) times daily.    Take 1 tablet by mouth 3 (three) times daily.  Discontinued Medications   No medications on file   ----------------------------------------------------------------------------------------------------------------------  Follow-up: Return for evaluation, procedure.  Trigger point injection: The area overlying the aforementioned trigger points were prepped with alcohol. They were then injected with a 25-gauge needle with 4 cc of ropivacaine 0.2% and Decadron 4 mg at each site after negative aspiration for heme. This was performed after informed consent was obtained and risks and benefits reviewed. She tolerated this procedure without difficulty and was convalesced and discharged to home in stable condition for follow-up as mentioned.  @James  Andree Elk, MD@  Molli Barrows, MD

## 2018-01-25 NOTE — Patient Instructions (Addendum)
You have been handed 2 scripts for Hydrocodone today.     Preparing for your procedure (without sedation) Instructions: . Oral Intake: Do not eat or drink anything for at least 3 hours prior to your procedure. . Transportation: Unless otherwise stated by your physician, you may drive yourself after the procedure. . Blood Pressure Medicine: Take your blood pressure medicine with a sip of water the morning of the procedure. . Insulin: Take only  of your normal insulin dose. . Preventing infections: Shower with an antibacterial soap the morning of your procedure. . Build-up your immune system: Take 1000 mg of Vitamin C with every meal (3 times a day) the day prior to your procedure. . Pregnancy: If you are pregnant, call and cancel the procedure. . Sickness: If you have a cold, fever, or any active infections, call and cancel the procedure. . Arrival: You must be in the facility at least 30 minutes prior to your scheduled procedure. . Children: Do not bring any children with you. . Dress appropriately: Bring dark clothing that you would not mind if they get stained. . Valuables: Do not bring any jewelry or valuables. Procedure appointments are reserved for interventional treatments only. Marland Kitchen No Prescription Refills. . No medication changes will be discussed during procedure appointments. No disability issues will be discussed. Epidural Steroid Injection An epidural steroid injection is given to relieve pain in your neck, back, or legs that is caused by the irritation or swelling of a nerve root. This procedure involves injecting a steroid and numbing medicine (anesthetic) into the epidural space. The epidural space is the space between the outer covering of your spinal cord and the bones that form your backbone (vertebra).  LET [facility] CARE PROVIDER KNOW ABOUT:  Any allergies you have. All medicines you are taking, including vitamins, herbs, eye drops, creams, and over-the-counter medicines  such as aspirin. Previous problems you or members of your family have had with the use of anesthetics. Any blood disorders or blood clotting disorders you have. Previous surgeries you have had. Medical conditions you have.  RISKS AND COMPLICATIONS Generally, this is a safe procedure. However, as with any procedure, complications can occur. Possible complications of epidural steroid injection include: Headache. Bleeding. Infection. Allergic reaction to the medicines. Damage to your nerves. The response to this procedure depends on the underlying cause of the pain and its duration. People who have long-term (chronic) pain are less likely to benefit from epidural steroids than are those people whose pain comes on strong and suddenly.  BEFORE THE PROCEDURE  Ask your health care provider about changing or stopping your regular medicines. You may be advised to stop taking blood-thinning medicines a few days before the procedure. You may be given medicines to reduce anxiety. Arrange for someone to take you home after the procedure.  PROCEDURE  You will remain awake during the procedure. You may receive medicine to make you relaxed. You will be asked to lie on your stomach. The injection site will be cleaned. The injection site will be numbed with a medicine (local anesthetic). A needle will be injected through your skin into the epidural space. Your health care provider will use an X-ray machine to ensure that the steroid is delivered closest to the affected nerve. You may have minimal discomfort at this time. Once the needle is in the right position, the local anesthetic and the steroid will be injected into the epidural space. The needle will then be removed and a bandage will be  applied to the injection site.  AFTER THE PROCEDURE  You may be monitored for a short time before you go home. You may feel weakness or numbness in your arm or leg, which disappears within hours. You may be  allowed to eat, drink, and take your regular medicine. You may have soreness at the site of the injection.   This information is not intended to replace advice given to you by your health care provider. Make sure you discuss any questions you have with your health care provider.   Document Released: 05/19/2007 Document Revised: 10/12/2012 Document Reviewed: 07/29/2012 Elsevier Interactive Patient Education 2016 [place]  What are the risk, side effects and possible complications? Generally speaking, most procedures are safe.  However, with any procedure there are risks, side effects, and the possibility of complications.  The risks and complications are dependent upon the sites that are lesioned, or the type of nerve block to be performed.  The closer the procedure is to the spine, the more serious the risks are.  Great care is taken when placing the radio frequency needles, block needles or lesioning probes, but sometimes complications can occur. Infection: Any time there is an injection through the skin, there is a risk of infection.  This is why sterile conditions are used for these blocks. There are four possible types of infection: 1. Localized skin infection. 2. Central Nervous System Infection: This can be in the form of Meningitis, which can be deadly. 3. Epidural Infections: This can be in the form of an epidural abscess, which can cause pressure inside of the spine, causing compression of the spinal cord with subsequent paralysis. This would require an emergency surgery to decompress, and there are no guarantees that the patient would recover from the paralysis. 4. Discitis: This is an infection of the intervertebral discs. It occurs in about 1% of discography procedures. It is difficult to treat and it may lead to surgery. Pain: the needles have to go through skin and soft tissues, will cause soreness. Damage to internal structures:  The nerves to  be lesioned may be near blood vessels or other nerves which can be potentially damaged. Bleeding: Bleeding is more common if the patient is taking blood thinners such as  aspirin, Coumadin, Ticiid, Plavix, etc., or if he/she have some genetic predisposition such as hemophilia. Bleeding into the spinal canal can cause compression of the spinal  cord with subsequent paralysis.  This would require an emergency surgery to decompress and there are no guarantees that the patient would recover from the paralysis. Pneumothorax: Puncturing of a lung is a possibility, every time a needle is introduced in the area of the chest or upper back.  Pneumothorax refers to free air around the collapsed lung(s), inside of the thoracic cavity (chest cavity).  Another two possible complications related to a similar event would include: Hemothorax and Chylothorax. These are variations of the Pneumothorax, where instead of air around the collapsed lung(s), you may have blood or chyle, respectively. Spinal headaches: They may occur with any procedures in the area of the spine. Persistent CSF (Cerebro-Spinal Fluid) leakage: This is a rare problem, but may occur with prolonged intrathecal or epidural catheters either due to the formation of a fistulous track or a dural tear. Nerve damage: By working so close to the spinal cord, there is always a possibility of nerve damage, which could be as serious as a permanent spinal cord injury with paralysis. Death: Although rare,  severe deadly allergic reactions known as "Anaphylactic reaction" can occur to any of the medications used. Worsening of the symptoms: We can always make thing worse.  What are the chances of something like this happening? Chances of any of this occuring are extremely low.  By statistics, you have more of a chance of getting killed in a motor vehicle accident: while driving to the hospital than any of the above occurring .  Nevertheless, you should be aware that they  are possibilities.  In general, it is similar to taking a shower.  Everybody knows that you can slip, hit your head and get killed.  Does that mean that you should not shower again?  Nevertheless always keep in mind that statistics do not mean anything if you happen to be on the wrong side of them.  Even if a procedure has a 1 (one) in a 1,000,000 (million) chance of going wrong, it you happen to be that one..Also, keep in mind that by statistics, you have more of a chance of having something go wrong when taking medications.  Who should not have this procedure? If you are on a blood thinning medication (e.g. Coumadin, Plavix, see list of "Blood Thinners"), or if you have an active infection going on, you should not have the procedure.  If you are taking any blood thinners, please inform your physician.  Preparing for your procedure: Do not eat or drink anything at least eight (8) hours prior to the procedure. Bring a driver with you .  It cannot be a taxi. Come accompanied by an adult that can drive you back, and that is strong enough to help you if your legs get weak or numb from the local anesthetic. Take all of your medicines the morning of the procedure with just enough water to swallow them. If you have diabetes, make sure that you are scheduled to have your procedure done first thing in the morning, whenever possible. If you have diabetes, take only half of your insulin dose and notify our nurse that you have done so as soon as you arrive at the clinic. If you are diabetic, but only take blood sugar pills (oral hypoglycemic), then do not take them on the morning of your procedure.  You may take them after you have had the procedure. Do not take aspirin or any aspirin-containing medications, at least eleven (11) days prior to the procedure.  They may prolong bleeding. Wear loose fitting clothing that may be easy to take off and that you would not mind if it got stained with Betadine or blood. Do  not wear any jewelry or perfume Remove any nail coloring.  It will interfere with some of our monitoring equipment. If you take Metformin for your diabetes, stop it 48 hours prior to the procedure.  NOTE: Remember that this is not meant to be interpreted as a complete list of all possible complications.  Unforeseen problems may occur.  BLOOD THINNERS The following drugs contain aspirin or other products, which can cause increased bleeding during surgery and should not be taken for 2 weeks prior to and 1 week after surgery.  If you should need take something for relief of minor pain, you may take acetaminophen which is found in Tylenol,m Datril, Anacin-3 and Panadol. It is not blood thinner. The products listed below are.  Do not take any of the products listed below in addition to any listed on your instruction sheet.  A.P.C or A.P.C with Codeine Codeine Phosphate  Capsules #3 Ibuprofen Ridaura  ABC compound Congesprin Imuran rimadil  Advil Cope Indocin Robaxisal  Alka-Seltzer Effervescent Pain Reliever and Antacid Coricidin or Coricidin-D  Indomethacin Rufen  Alka-Seltzer plus Cold Medicine Cosprin Ketoprofen S-A-C Tablets  Anacin Analgesic Tablets or Capsules Coumadin Korlgesic Salflex  Anacin Extra Strength Analgesic tablets or capsules CP-2 Tablets Lanoril Salicylate  Anaprox Cuprimine Capsules Levenox Salocol  Anexsia-D Dalteparin Magan Salsalate  Anodynos Darvon compound Magnesium Salicylate Sine-off  Ansaid Dasin Capsules Magsal Sodium Salicylate  Anturane Depen Capsules Marnal Soma  APF Arthritis pain formula Dewitt's Pills Measurin Stanback  Argesic Dia-Gesic Meclofenamic Sulfinpyrazone  Arthritis Bayer Timed Release Aspirin Diclofenac Meclomen Sulindac  Arthritis pain formula Anacin Dicumarol Medipren Supac  Analgesic (Safety coated) Arthralgen Diffunasal Mefanamic Suprofen  Arthritis Strength Bufferin Dihydrocodeine Mepro Compound Suprol  Arthropan liquid Dopirydamole  Methcarbomol with Aspirin Synalgos  ASA tablets/Enseals Disalcid Micrainin Tagament  Ascriptin Doan's Midol Talwin  Ascriptin A/D Dolene Mobidin Tanderil  Ascriptin Extra Strength Dolobid Moblgesic Ticlid  Ascriptin with Codeine Doloprin or Doloprin with Codeine Momentum Tolectin  Asperbuf Duoprin Mono-gesic Trendar  Aspergum Duradyne Motrin or Motrin IB Triminicin  Aspirin plain, buffered or enteric coated Durasal Myochrisine Trigesic  Aspirin Suppositories Easprin Nalfon Trillsate  Aspirin with Codeine Ecotrin Regular or Extra Strength Naprosyn Uracel  Atromid-S Efficin Naproxen Ursinus  Auranofin Capsules Elmiron Neocylate Vanquish  Axotal Emagrin Norgesic Verin  Azathioprine Empirin or Empirin with Codeine Normiflo Vitamin E  Azolid Emprazil Nuprin Voltaren  Bayer Aspirin plain, buffered or children's or timed BC Tablets or powders Encaprin Orgaran Warfarin Sodium  Buff-a-Comp Enoxaparin Orudis Zorpin  Buff-a-Comp with Codeine Equegesic Os-Cal-Gesic   Buffaprin Excedrin plain, buffered or Extra Strength Oxalid   Bufferin Arthritis Strength Feldene Oxphenbutazone   Bufferin plain or Extra Strength Feldene Capsules Oxycodone with Aspirin   Bufferin with Codeine Fenoprofen Fenoprofen Pabalate or Pabalate-SF   Buffets II Flogesic Panagesic   Buffinol plain or Extra Strength Florinal or Florinal with Codeine Panwarfarin   Buf-Tabs Flurbiprofen Penicillamine   Butalbital Compound Four-way cold tablets Penicillin   Butazolidin Fragmin Pepto-Bismol   Carbenicillin Geminisyn Percodan   Carna Arthritis Reliever Geopen Persantine   Carprofen Gold's salt Persistin   Chloramphenicol Goody's Phenylbutazone   Chloromycetin Haltrain Piroxlcam   Clmetidine heparin Plaquenil   Cllnoril Hyco-pap Ponstel   Clofibrate Hydroxy chloroquine Propoxyphen         Before stopping any of these medications, be sure to consult the physician who ordered them.  Some, such as Coumadin (Warfarin) are ordered  to prevent or treat serious conditions such as "deep thrombosis", "pumonary embolisms", and other heart problems.  The amount of time that you may need off of the medication may also vary with the medication and the reason for which you were taking it.  If you are taking any of these medications, please make sure you notify your pain physician before you undergo any procedures.Post-procedure Information What to expect: Most procedures involve the use of a local anesthetic (numbing medicine), and a steroid (anti-inflammatory medicine).  The local anesthetics may cause temporary numbness and weakness of the legs or arms, depending on the location of the block. This numbness/weakness may last 4-6 hours, depending on the local anesthetic used. In rare instances, it can last up to 24 hours. While numb, you must be very careful not to injure the extremity.  After any procedure, you could expect the pain to get better within 15-20 minutes. This relief is temporary and may last 4-6 hours. Once the local  anesthetics wears off, you could experience discomfort, possibly more than usual, for up to 10 (ten) days. In the case of radiofrequencies, it may last up to 6 weeks. Surgeries may take up to 8 weeks for the healing process. The discomfort is due to the irritation caused by needles going through skin and muscle. To minimize the discomfort, we recommend using ice the first day, and heat from then on. The ice should be applied for 15 minutes on, and 15 minutes off. Keep repeating this cycle until bedtime. Avoid applying the ice directly to the skin, to prevent frostbite. Heat should be used daily, until the pain improves (4-10 days). Be careful not to burn yourself.  Occasionally you may experience muscle spasms or cramps. These occur as a consequence of the irritation caused by the needle sticks to the muscle and the blood that will inevitably be lost into the surrounding muscle tissue. Blood tends to be very  irritating to tissues, which tend to react by going into spasm. These spasms may start the same day of your procedure, but they may also take days to develop. This late onset type of spasm or cramp is usually caused by electrolyte imbalances triggered by the steroids, at the level of the kidney. Cramps and spasms tend to respond well to muscle relaxants, multivitamins (some are triggered by the procedure, but may have their origins in vitamin deficiencies), and "Gatorade", or any sports drinks that can replenish any electrolyte imbalances. (If you are a diabetic, ask your pharmacist to get you a sugar-free brand.) Warm showers or baths may also be helpful. Stretching exercises are highly recommended. General Instructions:  Be alert for signs of possible infection: redness, swelling, heat, red streaks, elevated temperature, and/or fever. These typically appear 4 to 6 days after the procedure. Immediately notify your doctor if you experience unusual bleeding, difficulty breathing, or loss of bowel or bladder control. If you experience increased pain, do not increase your pain medicine intake, unless instructed by your pain physician. Post-Procedure Care:  Be careful in moving about. Muscle spasms in the area of the injection may occur. Applying ice or heat to the area is often helpful. The incidence of spinal headaches after epidural injections ranges between 1.4% and 6%. If you develop a headache that does not seem to respond to conservative therapy, please let your physician know. This can be treated with an epidural blood patch.   Post-procedure numbness or redness is to be expected, however it should average 4 to 6 hours. If numbness and weakness of your extremities begins to develop 4 to 6 hours after your procedure, and is felt to be progressing and worsening, immediately contact your physician.   Diet:  If you experience nausea, do not eat until this sensation goes away. If you had a "Stellate Ganglion  Block" for upper extremity "Reflex Sympathetic Dystrophy", do not eat or drink until your hoarseness goes away. In any case, always start with liquids first and if you tolerate them well, then slowly progress to more solid foods. Activity:  For the first 4 to 6 hours after the procedure, use caution in moving about as you may experience numbness and/or weakness. Use caution in cooking, using household electrical appliances, and climbing steps. If you need to reach your Doctor call our office: [phone] Monday-Thursday 8:00 am - 4:00 PM    Fridays: Closed     In case of an emergency: In case of emergency, call 911 or go to the nearest emergency  room and have the physician there call us.  Interpretation of Procedure Every nerve block has two components: a diagnostic component, and a treatment component. Unrealistic expectations are the most common causes of "perceived failure".  In a perfect world, a single nerve block should be able to completely and permanently eliminate the pain. Sadly, the world is not perfect.  Most pain management nerve blocks are performed using local anesthetics and steroids. Steroids are responsible for any long-term benefit that you may experience. Their purpose is to decrease any chronic swelling that may exist in the area. Steroids begin to work immediately after being injected. However, most patients will not experience any benefits until 5 to 10 days after the injection, when the swelling has come down to the point where they can tell a difference. Steroids will only help if there is swelling to be treated. As such, they can assist with the diagnosis. If effective, they suggest an inflammatory component to the pain, and if ineffective, they rule out inflammation as the main cause or component of the problem. If the problem is one of mechanical compression, you will get no benefit from those steroids.   In the case of local anesthetics, they have a crucial role in the  diagnosis of your condition. Most will begin to work within15 to 20 minutes after injection. The duration will depend on the type used (short- vs. Long-acting). It is of outmost importance that patients keep tract of their pain, after the procedure. To assist with this matter, a "Post-procedure Pain Diary" is provided. Make sure to complete it and to bring it back to your follow-up appointment.  As long as the patient keeps accurate, detailed records of their symptoms after every procedure, and returns to have those interpreted, every procedure will provide [place] with invaluable information. Even a block that does not provide the patient with any relief, will always provide [place] with information about the mechanism and the origin of the pain. The only time a nerve block can be considered a waste of time is when patients do not keep track of the results, or do not keep their post-procedure appointment.  Reporting the results back to your physician The Pain Score  Pain is a subjective complaint. It cannot be seen, touched, or measured. We depend entirely on the patient's report of the pain in order to assess your condition and treatment. To evaluate the pain, we use a pain scale, where "0" means "No Pain", and a "10" is "the worst possible pain that you can even imagine" (i.e. something like been eaten alive by a shark or being torn apart by a lion).  .  You will frequently be asked to rate your pain. Please be as accurate, remember that medical decisions will be based on your responses. Please do not rate your pain above a 10. Doing so is actually interpreted as "symptom magnification" (exaggeration), as well as lack of understanding with regards to the scale. To put this into perspective, when you tell us that your pain is at a 10 (ten), what you are saying is that there is nothing we can do to make this pain any worse. (Carefully think about that.)

## 2018-03-10 ENCOUNTER — Telehealth: Payer: Self-pay | Admitting: Anesthesiology

## 2018-03-10 NOTE — Telephone Encounter (Signed)
Can you answer this question?

## 2018-03-10 NOTE — Telephone Encounter (Signed)
Patient is inquiring why she was charged for a treatment room when she only had an injection in room the last time she was here. Says the charge is quite a bit more and she didn't have anything different.

## 2018-03-28 ENCOUNTER — Telehealth: Payer: Self-pay | Admitting: *Deleted

## 2018-03-28 NOTE — Telephone Encounter (Signed)
Spoke with patient regarding charges. The last visit was charged for a procedure and the procedure visit prior should have been charged for a procedure and was only charged for medications. Encouraged to speak with billing that can see itemized charges. Patient agrees she will call.

## 2018-03-29 ENCOUNTER — Encounter: Payer: Medicare Other | Admitting: Anesthesiology

## 2018-03-30 ENCOUNTER — Encounter: Payer: Self-pay | Admitting: Anesthesiology

## 2018-03-30 ENCOUNTER — Other Ambulatory Visit: Payer: Self-pay

## 2018-03-30 ENCOUNTER — Ambulatory Visit: Payer: Medicare Other | Attending: Anesthesiology | Admitting: Anesthesiology

## 2018-03-30 VITALS — BP 118/67 | HR 93 | Temp 98.2°F | Resp 18 | Ht 67.5 in | Wt 196.0 lb

## 2018-03-30 DIAGNOSIS — F1190 Opioid use, unspecified, uncomplicated: Secondary | ICD-10-CM | POA: Diagnosis present

## 2018-03-30 DIAGNOSIS — G894 Chronic pain syndrome: Secondary | ICD-10-CM | POA: Insufficient documentation

## 2018-03-30 DIAGNOSIS — M5431 Sciatica, right side: Secondary | ICD-10-CM

## 2018-03-30 DIAGNOSIS — M4716 Other spondylosis with myelopathy, lumbar region: Secondary | ICD-10-CM

## 2018-03-30 DIAGNOSIS — M5441 Lumbago with sciatica, right side: Secondary | ICD-10-CM | POA: Diagnosis present

## 2018-03-30 DIAGNOSIS — G8929 Other chronic pain: Secondary | ICD-10-CM | POA: Insufficient documentation

## 2018-03-30 DIAGNOSIS — M5442 Lumbago with sciatica, left side: Secondary | ICD-10-CM | POA: Diagnosis present

## 2018-03-30 DIAGNOSIS — M47817 Spondylosis without myelopathy or radiculopathy, lumbosacral region: Secondary | ICD-10-CM | POA: Insufficient documentation

## 2018-03-30 DIAGNOSIS — M5136 Other intervertebral disc degeneration, lumbar region: Secondary | ICD-10-CM | POA: Diagnosis present

## 2018-03-30 DIAGNOSIS — M5432 Sciatica, left side: Secondary | ICD-10-CM | POA: Diagnosis present

## 2018-03-30 MED ORDER — HYDROCODONE-ACETAMINOPHEN 7.5-325 MG PO TABS: 1.0000 | ORAL_TABLET | Freq: Four times a day (QID) | ORAL | 0 refills | Status: DC | PRN

## 2018-03-30 MED ORDER — HYDROCODONE-ACETAMINOPHEN 7.5-325 MG PO TABS: 1.0000 | ORAL_TABLET | Freq: Three times a day (TID) | ORAL | 0 refills | Status: AC

## 2018-03-30 NOTE — Patient Instructions (Signed)
Hydrocodone with acetaminophen to last until 05/29/2018 has been escribed to your pharmacy.

## 2018-03-30 NOTE — Progress Notes (Signed)
Nursing Pain Medication Assessment:  Safety precautions to be maintained throughout the outpatient stay will include: orient to surroundings, keep bed in low position, maintain call bell within reach at all times, provide assistance with transfer out of bed and ambulation.  Medication Inspection Compliance: Pill count conducted under aseptic conditions, in front of the patient. Neither the pills nor the bottle was removed from the patient's sight at any time. Once count was completed pills were immediately returned to the patient in their original bottle.  Medication: Hydrocodone/APAP Pill/Patch Count: 8 of 90 pills remain Pill/Patch Appearance: Markings consistent with prescribed medication Bottle Appearance: Standard pharmacy container. Clearly labeled. Filled Date: 01 / 06 / 2020 Last Medication intake:  Today

## 2018-04-02 NOTE — Progress Notes (Signed)
Subjective:  Patient ID: Kelsey Diaz, female    DOB: 20-Jul-1945  Age: 73 y.o. MRN: [id number]  CC: Back Pain (low)   Procedure: None  HPI Kelsey Diaz presents for reevaluation.  Kelsey Diaz was last seen few months ago and has been doing well with her current regimen and medication management.  The pain that she presents with has been stable in nature with no significant changes in the quality characteristic or distribution.  Otherwise she is been doing well with this regimen of medication management and based on her narcotic assessment sheet she continues to derive good functional lifestyle improvement with the medicine.  No untoward side effects are noted.  The distribution also has been stable in nature with no new changes to the lower extremity strength or function or bowel or bladder function.  Outpatient Medications Prior to Visit  Medication Sig Dispense Refill  . albuterol (PROAIR HFA) 108 (90 BASE) MCG/ACT inhaler Inhale 2 puffs into the lungs every 6 (six) hours as needed for shortness of breath.     . Calcium Carbonate-Vitamin D (CALCIUM + D) 600-200 MG-UNIT TABS Take 1 tablet by mouth daily.     . clobetasol cream (TEMOVATE) 7.74 % Apply 1 application topically 2 (two) times daily as needed (irritation).     . colestipol (COLESTID) 1 g tablet Take 1 g by mouth daily.    . DULoxetine (CYMBALTA) 60 MG capsule Take 60 mg by mouth daily.    . fluocinonide (LIDEX) 0.05 % external solution Apply 1 application topically 2 (two) times daily as needed (skin irritation).     . Fluticasone-Umeclidin-Vilant (TRELEGY ELLIPTA) 100-62.5-25 MCG/INH AEPB Inhale 1 puff into the lungs daily. 1 each 6  . LORazepam (ATIVAN) 0.5 MG tablet Take 0.5 mg by mouth every 8 (eight) hours as needed for anxiety.     . meloxicam (MOBIC) 15 MG tablet Take 1 tablet (15 mg total) by mouth daily.    . methocarbamol (ROBAXIN) 500 MG tablet Take 1 tablet (500 mg total) by mouth 4 (four) times daily. 120 tablet 2   . Multiple Vitamin (MULTI-VITAMIN PO) Take 1 tablet by mouth daily.     . Omega-3 Fatty Acids (FISH OIL PO) Take 1,000 mg by mouth daily.     Marland Kitchen omeprazole (PRILOSEC) 40 MG capsule Take 1 capsule by mouth daily.  1  . Ospemifene (OSPHENA) 60 MG TABS Take 60 mg by mouth every other day.     Marland Kitchen PREMARIN vaginal cream Place 1 Applicatorful vaginally daily as needed.     . valsartan-hydrochlorothiazide (DIOVAN-HCT) 160-12.5 MG tablet Take 1 tablet by mouth daily.     Marland Kitchen HYDROcodone-acetaminophen (NORCO) 7.5-325 MG tablet Take 1 tablet by mouth 3 (three) times daily. 90 tablet 0  . oxyCODONE-acetaminophen (PERCOCET/ROXICET) 5-325 MG tablet Take 1 tablet by mouth every 6 (six) hours as needed.  0   Facility-Administered Medications Prior to Visit  Medication Dose Route Frequency Provider Last Rate Last Dose  . betamethasone acetate-betamethasone sodium phosphate (CELESTONE) injection 3 mg  3 mg Intramuscular Once Edrick Kins, DPM        Review of Systems CNS: No confusion or sedation Cardiac: No angina or palpitations GI: No abdominal pain or constipation Constitutional: No nausea vomiting fevers or chills  Objective:  BP 118/67   Pulse 93   Temp 98.2 F (36.8 C) (Oral)   Resp 18   Ht 5' 7.5" (1.715 m)   Wt 196 lb (88.9 kg)   SpO2  96%   BMI 30.24 kg/m    BP Readings from Last 3 Encounters:  03/30/18 118/67  01/25/18 135/77  11/10/17 134/81     Wt Readings from Last 3 Encounters:  03/30/18 196 lb (88.9 kg)  01/25/18 199 lb (90.3 kg)  11/10/17 190 lb (86.2 kg)     Physical Exam Pt is alert and oriented PERRL EOMI HEART IS RRR no murmur or rub LCTA no wheezing or rales MUSCULOSKELETAL reveals some paraspinous muscle tenderness in the lower lumbar region.  No overt trigger points are noted.  Labs  No results found for: HGBA1C Lab Results  Component Value Date   CREATININE 0.72 09/13/2017     -------------------------------------------------------------------------------------------------------------------- Lab Results  Component Value Date   WBC 9.8 09/08/2017   HGB 11.8 (L) 09/08/2017   HCT 35.3 (L) 09/08/2017   PLT 189 09/08/2017   GLUCOSE 98 09/13/2017   ALT 20 06/03/2017   AST 22 06/03/2017   NA 138 09/13/2017   K 3.4 (L) 09/13/2017   CL 100 09/13/2017   CREATININE 0.72 09/13/2017   BUN 11 09/13/2017   CO2 30 09/13/2017   TSH 1.260 06/03/2017   INR 1.0 06/03/2017    --------------------------------------------------------------------------------------------------------------------- Dg Ribs Unilateral W/chest Left  Result Date: 12/28/2017 CLINICAL DATA:  Fall from bed 6 days ago.  Left upper chest pain. EXAM: LEFT RIBS AND CHEST - 3+ VIEW COMPARISON:  Radiographs 09/13/2017. FINDINGS: The heart size and mediastinal contours are stable. There is mild aortic atherosclerosis. There is stable mild biapical scarring. The lungs are otherwise clear. There is no pleural effusion or pneumothorax. There is a mildly displaced fracture of the left 3rd rib anteriorly. There is a possible nondisplaced fracture of the left 4th rib. IMPRESSION: 1. Mildly displaced fracture of the left 3rd rib anteriorly. 2. No evidence of pneumothorax or pleural effusion. Electronically Signed   By: Richardean Sale M.D.   On: 12/28/2017 16:44     Assessment & Plan:   Kelsey Diaz was seen today for back pain.  Diagnoses and all orders for this visit:  DDD (degenerative disc disease), lumbar  Bilateral sciatica  Lumbar spondylosis with myelopathy  Chronic pain syndrome -     ToxASSURE Select 13 (MW), Urine  Chronic, continuous use of opioids -     ToxASSURE Select 13 (MW), Urine  Facet arthritis of lumbosacral region  Chronic bilateral low back pain with bilateral sciatica  Other orders -     HYDROcodone-acetaminophen (NORCO) 7.5-325 MG tablet; Take 1 tablet by mouth 3 (three) times  daily for 30 days. -     HYDROcodone-acetaminophen (NORCO) 7.5-325 MG tablet; Take 1 tablet by mouth every 6 (six) hours as needed for up to 30 days for moderate pain or severe pain.        ----------------------------------------------------------------------------------------------------------------------  Problem List Items Addressed This Visit    None    Visit Diagnoses    DDD (degenerative disc disease), lumbar    -  Primary   Relevant Medications   HYDROcodone-acetaminophen (NORCO) 7.5-325 MG tablet   HYDROcodone-acetaminophen (NORCO) 7.5-325 MG tablet (Start on 04/29/2018)   Bilateral sciatica       Lumbar spondylosis with myelopathy       Chronic pain syndrome       Relevant Orders   ToxASSURE Select 13 (MW), Urine   Chronic, continuous use of opioids       Relevant Orders   ToxASSURE Select 13 (MW), Urine   Facet arthritis of lumbosacral region  Relevant Medications   HYDROcodone-acetaminophen (NORCO) 7.5-325 MG tablet   HYDROcodone-acetaminophen (NORCO) 7.5-325 MG tablet (Start on 04/29/2018)   Chronic bilateral low back pain with bilateral sciatica       Relevant Medications   HYDROcodone-acetaminophen (NORCO) 7.5-325 MG tablet   HYDROcodone-acetaminophen (NORCO) 7.5-325 MG tablet (Start on 04/29/2018)        ----------------------------------------------------------------------------------------------------------------------  1. DDD (degenerative disc disease), lumbar Continue with core stretching strengthening exercises.  2. Bilateral sciatica As above  3. Lumbar spondylosis with myelopathy As above  4. Chronic pain syndrome Unfortunately she has failed conservative therapy and continues to require chronic opioid management.  She has been compliant with her regimen this seems to be working well for her.  Refills will be given for February 5 and March 6 with return to clinic in 2 months. - ToxASSURE Select 13 (MW), Urine  5. Chronic, continuous use of  opioids Reviewed the [facility] practitioner database information and it is appropriate. - ToxASSURE Select 13 (MW), Urine  6. Facet arthritis of lumbosacral region As above  7. Chronic bilateral low back pain with bilateral sciatica     ----------------------------------------------------------------------------------------------------------------------  I have changed Kelsey Diaz. Demond's HYDROcodone-acetaminophen. I am also having her start on HYDROcodone-acetaminophen. Additionally, I am having her maintain her Calcium Carbonate-Vitamin D, Multiple Vitamin (MULTI-VITAMIN PO), DULoxetine, albuterol, Ospemifene, Omega-3 Fatty Acids (FISH OIL PO), valsartan-hydrochlorothiazide, PREMARIN, LORazepam, clobetasol cream, fluocinonide, omeprazole, meloxicam, Fluticasone-Umeclidin-Vilant, methocarbamol, oxyCODONE-acetaminophen, and colestipol. We will continue to administer betamethasone acetate-betamethasone sodium phosphate.   Meds ordered this encounter  Medications  . HYDROcodone-acetaminophen (NORCO) 7.5-325 MG tablet    Sig: Take 1 tablet by mouth 3 (three) times daily for 30 days.    Dispense:  90 tablet    Refill:  0    30 day supply..  . HYDROcodone-acetaminophen (NORCO) 7.5-325 MG tablet    Sig: Take 1 tablet by mouth every 6 (six) hours as needed for up to 30 days for moderate pain or severe pain.    Dispense:  90 tablet    Refill:  0   Patient's Medications  New Prescriptions   HYDROCODONE-ACETAMINOPHEN (NORCO) 7.5-325 MG TABLET    Take 1 tablet by mouth every 6 (six) hours as needed for up to 30 days for moderate pain or severe pain.  Previous Medications   ALBUTEROL (PROAIR HFA) 108 (90 BASE) MCG/ACT INHALER    Inhale 2 puffs into the lungs every 6 (six) hours as needed for shortness of breath.    CALCIUM CARBONATE-VITAMIN D (CALCIUM + D) 600-200 MG-UNIT TABS    Take 1 tablet by mouth daily.    CLOBETASOL CREAM (TEMOVATE) 0.05 %    Apply 1 application topically 2 (two)  times daily as needed (irritation).    COLESTIPOL (COLESTID) 1 G TABLET    Take 1 g by mouth daily.   DULOXETINE (CYMBALTA) 60 MG CAPSULE    Take 60 mg by mouth daily.   FLUOCINONIDE (LIDEX) 0.05 % EXTERNAL SOLUTION    Apply 1 application topically 2 (two) times daily as needed (skin irritation).    FLUTICASONE-UMECLIDIN-VILANT (TRELEGY ELLIPTA) 100-62.5-25 MCG/INH AEPB    Inhale 1 puff into the lungs daily.   LORAZEPAM (ATIVAN) 0.5 MG TABLET    Take 0.5 mg by mouth every 8 (eight) hours as needed for anxiety.    MELOXICAM (MOBIC) 15 MG TABLET    Take 1 tablet (15 mg total) by mouth daily.   METHOCARBAMOL (ROBAXIN) 500 MG TABLET    Take 1 tablet (500  mg total) by mouth 4 (four) times daily.   MULTIPLE VITAMIN (MULTI-VITAMIN PO)    Take 1 tablet by mouth daily.    OMEGA-3 FATTY ACIDS (FISH OIL PO)    Take 1,000 mg by mouth daily.    OMEPRAZOLE (PRILOSEC) 40 MG CAPSULE    Take 1 capsule by mouth daily.   OSPEMIFENE (OSPHENA) 60 MG TABS    Take 60 mg by mouth every other day.    OXYCODONE-ACETAMINOPHEN (PERCOCET/ROXICET) 5-325 MG TABLET    Take 1 tablet by mouth every 6 (six) hours as needed.   PREMARIN VAGINAL CREAM    Place 1 Applicatorful vaginally daily as needed.    VALSARTAN-HYDROCHLOROTHIAZIDE (DIOVAN-HCT) 160-12.5 MG TABLET    Take 1 tablet by mouth daily.   Modified Medications   Modified Medication Previous Medication   HYDROCODONE-ACETAMINOPHEN (NORCO) 7.5-325 MG TABLET HYDROcodone-acetaminophen (NORCO) 7.5-325 MG tablet      Take 1 tablet by mouth 3 (three) times daily for 30 days.    Take 1 tablet by mouth 3 (three) times daily.  Discontinued Medications   No medications on file   ----------------------------------------------------------------------------------------------------------------------  Follow-up: Return in about 2 months (around 05/29/2018) for evaluation, med refill.    Molli Barrows, MD

## 2018-05-18 ENCOUNTER — Ambulatory Visit: Payer: Medicare Other | Attending: Anesthesiology | Admitting: Anesthesiology

## 2018-05-18 ENCOUNTER — Other Ambulatory Visit: Payer: Self-pay

## 2018-05-18 DIAGNOSIS — M4716 Other spondylosis with myelopathy, lumbar region: Secondary | ICD-10-CM

## 2018-05-18 DIAGNOSIS — M5431 Sciatica, right side: Secondary | ICD-10-CM

## 2018-05-18 DIAGNOSIS — M5136 Other intervertebral disc degeneration, lumbar region: Secondary | ICD-10-CM | POA: Diagnosis not present

## 2018-05-18 DIAGNOSIS — M5432 Sciatica, left side: Secondary | ICD-10-CM

## 2018-05-18 DIAGNOSIS — M47817 Spondylosis without myelopathy or radiculopathy, lumbosacral region: Secondary | ICD-10-CM

## 2018-05-18 DIAGNOSIS — M25552 Pain in left hip: Secondary | ICD-10-CM

## 2018-05-18 DIAGNOSIS — F1190 Opioid use, unspecified, uncomplicated: Secondary | ICD-10-CM

## 2018-05-18 DIAGNOSIS — G894 Chronic pain syndrome: Secondary | ICD-10-CM

## 2018-05-18 MED ORDER — HYDROCODONE-ACETAMINOPHEN 7.5-325 MG PO TABS: 1.0000 | ORAL_TABLET | Freq: Four times a day (QID) | ORAL | 0 refills | Status: AC | PRN

## 2018-05-18 MED ORDER — HYDROCODONE-ACETAMINOPHEN 7.5-325 MG PO TABS: 1.0000 | ORAL_TABLET | Freq: Three times a day (TID) | ORAL | 0 refills | Status: AC

## 2018-05-18 NOTE — Progress Notes (Signed)
Virtual Visit via Telephone Note  I connected with Brand Males on 05/18/18 at 12:30 PM EDT by telephone and verified that I am speaking with the correct person using two identifiers.   I discussed the limitations, risks, security and privacy concerns of performing an evaluation and management service by telephone and the availability of in person appointments. I also discussed with the patient that there may be a patient responsible charge related to this service. The patient expressed understanding and agreed to proceed.   History of Present Illness: Wynter continues to struggle with her low back pain.  The quality characteristic distribution been the same but otherwise she is having some increased intensity low back spasming.  Secondary to the globin issue she is not been able to get out and about as much.  She had epidural back in the fall of last year and this gave her some relief and she is interested in pursuing a repeat epidural once this viral situation improves.  Otherwise she is doing well with her current medication.  She denies any changes in the narcotic assessment questions since her last visit and she is continuing to derive good functional lifestyle improvement with the medicines.  She is due for refill in the near future.  Otherwise she is in her usual state of health at this point.    Observations/Objective:   Assessment and Plan: 1.  Chronic low back pain 2.  Chronic sciatica that is recently worsened.  She has been instructed to contact us if she wants to come in for repeat epidural. 3.  Chronic pain syndrome.  We have reviewed the [facility] practitioner database information and it is appropriate.  Refills will be given for her Vicodin for April 8 and May 8. 4.  Chronic opioid management    Follow Up Instructions:    I discussed the assessment and treatment plan with the patient. The patient was provided an opportunity to ask questions and all were answered. The  patient agreed with the plan and demonstrated an understanding of the instructions.   The patient was advised to call back or seek an in-person evaluation if the symptoms worsen or if the condition fails to improve as anticipated.  I provided 20 minutes of non-face-to-face time during this encounter.   Molli Barrows, MD

## 2018-08-01 ENCOUNTER — Ambulatory Visit: Payer: Medicare Other | Attending: Anesthesiology | Admitting: Anesthesiology

## 2018-08-01 ENCOUNTER — Encounter: Payer: Self-pay | Admitting: Anesthesiology

## 2018-08-01 ENCOUNTER — Other Ambulatory Visit: Payer: Self-pay

## 2018-08-01 DIAGNOSIS — G894 Chronic pain syndrome: Secondary | ICD-10-CM

## 2018-08-01 DIAGNOSIS — M4716 Other spondylosis with myelopathy, lumbar region: Secondary | ICD-10-CM | POA: Diagnosis not present

## 2018-08-01 DIAGNOSIS — M5136 Other intervertebral disc degeneration, lumbar region: Secondary | ICD-10-CM | POA: Diagnosis not present

## 2018-08-01 DIAGNOSIS — M5431 Sciatica, right side: Secondary | ICD-10-CM

## 2018-08-01 DIAGNOSIS — M5432 Sciatica, left side: Secondary | ICD-10-CM

## 2018-08-01 DIAGNOSIS — M25552 Pain in left hip: Secondary | ICD-10-CM

## 2018-08-01 DIAGNOSIS — M47817 Spondylosis without myelopathy or radiculopathy, lumbosacral region: Secondary | ICD-10-CM

## 2018-08-01 DIAGNOSIS — F1190 Opioid use, unspecified, uncomplicated: Secondary | ICD-10-CM

## 2018-08-01 MED ORDER — LORAZEPAM 0.5 MG PO TABS: 0.5000 mg | ORAL_TABLET | Freq: Every day | ORAL | 0 refills | Status: AC

## 2018-08-01 MED ORDER — HYDROCODONE-ACETAMINOPHEN 7.5-325 MG PO TABS: 1.0000 | ORAL_TABLET | Freq: Four times a day (QID) | ORAL | 0 refills | Status: AC | PRN

## 2018-08-01 NOTE — Progress Notes (Signed)
Virtual Visit via Telephone Note  I connected with Kelsey Diaz on 08/01/18 at  3:15 PM EDT by telephone and verified that I am speaking with the correct person using two identifiers.  Location: Patient: Home Provider: [facility] control center   I discussed the limitations, risks, security and privacy concerns of performing an evaluation and management service by telephone and the availability of in person appointments. I also discussed with the patient that there may be a patient responsible charge related to this service. The patient expressed understanding and agreed to proceed.   History of Present Illness: I spoke with Kelsey Diaz over the phone today.  She was unable to do a visual voice meeting.  She states that she still having diffuse body pain consistent with her fibromyalgia.  Her back pain is stable in nature and the hydrocodone 3 times a day seems to help with both conditions and presents no side effects.  Her functional status is better with the medications.  She is noticing that she does have more pain in the morning when she gets up but after about 2 or 3 hours that seems to subside.  Otherwise she is in her usual state of health at this time.    Observations/Objective: Current Outpatient Medications:  .  albuterol (PROAIR HFA) 108 (90 BASE) MCG/ACT inhaler, Inhale 2 puffs into the lungs every 6 (six) hours as needed for shortness of breath. , Disp: , Rfl:  .  Calcium Carbonate-Vitamin D (CALCIUM + D) 600-200 MG-UNIT TABS, Take 1 tablet by mouth daily. , Disp: , Rfl:  .  clobetasol cream (TEMOVATE) 1.24 %, Apply 1 application topically 2 (two) times daily as needed (irritation). , Disp: , Rfl:  .  colestipol (COLESTID) 1 g tablet, Take 1 g by mouth daily., Disp: , Rfl:  .  DULoxetine (CYMBALTA) 60 MG capsule, Take 60 mg by mouth daily., Disp: , Rfl:  .  fluocinonide (LIDEX) 0.05 % external solution, Apply 1 application topically 2 (two) times daily as needed (skin irritation). , Disp:  , Rfl:  .  Fluticasone-Umeclidin-Vilant (TRELEGY ELLIPTA) 100-62.5-25 MCG/INH AEPB, Inhale 1 puff into the lungs daily., Disp: 1 each, Rfl: 6 .  HYDROcodone-acetaminophen (NORCO) 7.5-325 MG tablet, Take 1 tablet by mouth every 6 (six) hours as needed for up to 30 days for moderate pain or severe pain., Disp: 90 tablet, Rfl: 0 .  [START ON 08/31/2018] HYDROcodone-acetaminophen (NORCO) 7.5-325 MG tablet, Take 1 tablet by mouth every 6 (six) hours as needed for up to 30 days for moderate pain or severe pain., Disp: 90 tablet, Rfl: 0 .  LORazepam (ATIVAN) 0.5 MG tablet, Take 0.5 mg by mouth every 8 (eight) hours as needed for anxiety. , Disp: , Rfl:  .  meloxicam (MOBIC) 15 MG tablet, Take 1 tablet (15 mg total) by mouth daily., Disp: , Rfl:  .  methocarbamol (ROBAXIN) 500 MG tablet, Take 1 tablet (500 mg total) by mouth 4 (four) times daily., Disp: 120 tablet, Rfl: 2 .  Multiple Vitamin (MULTI-VITAMIN PO), Take 1 tablet by mouth daily. , Disp: , Rfl:  .  Omega-3 Fatty Acids (FISH OIL PO), Take 1,000 mg by mouth daily. , Disp: , Rfl:  .  omeprazole (PRILOSEC) 40 MG capsule, Take 1 capsule by mouth daily., Disp: , Rfl: 1 .  Ospemifene (OSPHENA) 60 MG TABS, Take 60 mg by mouth every other day. , Disp: , Rfl:  .  oxyCODONE-acetaminophen (PERCOCET/ROXICET) 5-325 MG tablet, Take 1 tablet by mouth every 6 (  six) hours as needed., Disp: , Rfl: 0 .  PREMARIN vaginal cream, Place 1 Applicatorful vaginally daily as needed. , Disp: , Rfl:  .  valsartan-hydrochlorothiazide (DIOVAN-HCT) 160-12.5 MG tablet, Take 1 tablet by mouth daily. , Disp: , Rfl:   Current Facility-Administered Medications:  .  betamethasone acetate-betamethasone sodium phosphate (CELESTONE) injection 3 mg, 3 mg, Intramuscular, Once, Edrick Kins, DPM   Assessment and Plan: 1. DDD (degenerative disc disease), lumbar   2. Bilateral sciatica   3. Lumbar spondylosis with myelopathy   4. Chronic pain syndrome   5. Chronic, continuous use of  opioids   6. Facet arthritis of lumbosacral region   7. Hip pain, acute, left    Based on our discussion today I am going to refill her medications for the hydrocodone 7.5 mg tablets.  This will be dated for June 8 and July 8.  I talked to her about taking her Mobic at nighttime before she goes to bed and going straight to a shower in the morning to help loosen up her musculature secondary to the fibromyalgia.  I want her to continue stretching strengthening exercises.  I have reviewed the [facility] practitioner database information and it is appropriate.  She is scheduled for return in 2 months and to continue follow-up with her primary care physicians.  Follow Up Instructions:    I discussed the assessment and treatment plan with the patient. The patient was provided an opportunity to ask questions and all were answered. The patient agreed with the plan and demonstrated an understanding of the instructions.   The patient was advised to call back or seek an in-person evaluation if the symptoms worsen or if the condition fails to improve as anticipated.  I provided 30 minutes of non-face-to-face time during this encounter.   Molli Barrows, MD

## 2018-08-30 ENCOUNTER — Other Ambulatory Visit: Payer: Self-pay

## 2018-08-30 ENCOUNTER — Encounter: Payer: Self-pay | Admitting: Emergency Medicine

## 2018-08-30 ENCOUNTER — Emergency Department
Admission: EM | Admit: 2018-08-30 | Discharge: 2018-08-30 | Disposition: A | Discharge status: Home / Self Care | Payer: Medicare Other | Attending: Emergency Medicine | Admitting: Emergency Medicine

## 2018-08-30 DIAGNOSIS — Z87891 Personal history of nicotine dependence: Secondary | ICD-10-CM | POA: Insufficient documentation

## 2018-08-30 DIAGNOSIS — Z7982 Long term (current) use of aspirin: Secondary | ICD-10-CM | POA: Insufficient documentation

## 2018-08-30 DIAGNOSIS — Z79899 Other long term (current) drug therapy: Secondary | ICD-10-CM | POA: Insufficient documentation

## 2018-08-30 DIAGNOSIS — J449 Chronic obstructive pulmonary disease, unspecified: Secondary | ICD-10-CM | POA: Diagnosis not present

## 2018-08-30 DIAGNOSIS — J45909 Unspecified asthma, uncomplicated: Secondary | ICD-10-CM | POA: Insufficient documentation

## 2018-08-30 DIAGNOSIS — I10 Essential (primary) hypertension: Secondary | ICD-10-CM | POA: Diagnosis not present

## 2018-08-30 DIAGNOSIS — Z91040 Latex allergy status: Secondary | ICD-10-CM | POA: Insufficient documentation

## 2018-08-30 DIAGNOSIS — E039 Hypothyroidism, unspecified: Secondary | ICD-10-CM | POA: Insufficient documentation

## 2018-08-30 DIAGNOSIS — M79601 Pain in right arm: Secondary | ICD-10-CM | POA: Diagnosis present

## 2018-08-30 MED ORDER — KETOROLAC TROMETHAMINE 30 MG/ML IJ SOLN
30.0000 mg | Freq: Once | INTRAMUSCULAR | Status: AC
  Administered 2018-08-30: 30 mg via INTRAVENOUS
  Filled 2018-08-30: qty 1

## 2018-08-30 MED ORDER — LIDOCAINE 5 % EX PTCH
1.0000 | MEDICATED_PATCH | CUTANEOUS | Status: DC
  Administered 2018-08-30: 1 via TRANSDERMAL
  Filled 2018-08-30: qty 1

## 2018-08-30 MED ORDER — GABAPENTIN 300 MG PO CAPS
300.0000 mg | ORAL_CAPSULE | Freq: Once | ORAL | Status: AC
  Administered 2018-08-30: 300 mg via ORAL
  Filled 2018-08-30: qty 1

## 2018-08-30 MED ORDER — FENTANYL CITRATE (PF) 100 MCG/2ML IJ SOLN
50.0000 ug | Freq: Once | INTRAMUSCULAR | Status: AC
  Administered 2018-08-30: 50 ug via INTRAVENOUS
  Filled 2018-08-30: qty 2

## 2018-08-30 MED ORDER — ONDANSETRON 4 MG PO TBDP
4.0000 mg | ORAL_TABLET | Freq: Once | ORAL | Status: AC | PRN
  Administered 2018-08-30: 4 mg via ORAL
  Filled 2018-08-30: qty 1

## 2018-08-30 NOTE — ED Provider Notes (Signed)
[facility] Emergency Department Provider Note   ____________________________________________   I have reviewed the triage vital signs and the nursing notes.   HISTORY  Chief Complaint Hand Pain   History limited by: Not Limited   HPI Kelsey Diaz is a 73 y.o. female who presents to the emergency department today because of concern for right hand pain. The pain started this morning. She says that it started in her fingers on the right hand and has since spread down her arm towards her elbow. She did feel that it was swollen at one time and that her veins were dark. The patient also has had some associated tingling to her finger tips. The patient denies any trauma to her arm.   Records reviewed. Per medical record review patient has a history of fibromyalgia.   Past Medical History:  Diagnosis Date  . Allergic rhinitis   . Anxiety   . Asthma   . Colitis   . COPD (chronic obstructive pulmonary disease) ([place])   . Fibromyalgia   . GERD (gastroesophageal reflux disease)    With esophageal strictures  . Hypertension   . Hyperthyroidism   . OSA (obstructive sleep apnea) 02/09/2012   No mask.  Did not tolerate  . Osteoarthritis   . Pneumonia 08/2017  . Pneumonia   . Rib fracture     Patient Active Problem List   Diagnosis Date Noted  . HTN (hypertension) 09/13/2017  . PNA (pneumonia) 09/13/2017  . Abnormal nuclear stress test 06/03/2017  . Chest pain 06/03/2017  . Dizzy 06/13/2012  . OSA (obstructive sleep apnea) 02/09/2012  . COPD with asthma ([place]) 09/07/2011    Past Surgical History:  Procedure Laterality Date  . BREAST BIOPSY     x2  . CHOLECYSTECTOMY     2005  . KNEE SURGERY Right 2013   two torn ligaments repaired.   Marland Kitchen LEFT HEART CATH AND CORONARY ANGIOGRAPHY N/A 06/08/2017   Procedure: LEFT HEART CATH AND CORONARY ANGIOGRAPHY;  Surgeon: Troy Sine, MD;  Location: [facility];  Service: Cardiovascular;  Laterality: N/A;   . NASAL SINUS SURGERY    . VAGINAL HYSTERECTOMY      Prior to Admission medications   Medication Sig Start Date End Date Taking? Authorizing Provider  albuterol (PROAIR HFA) 108 (90 BASE) MCG/ACT inhaler Inhale 2 puffs into the lungs every 6 (six) hours as needed for shortness of breath.     [provider]  Calcium Carbonate-Vitamin D (CALCIUM + D) 600-200 MG-UNIT TABS Take 1 tablet by mouth daily.     [provider]  clobetasol cream (TEMOVATE) 0.86 % Apply 1 application topically 2 (two) times daily as needed (irritation).     [provider]  colestipol (COLESTID) 1 g tablet Take 1 g by mouth daily.    [provider]  DULoxetine (CYMBALTA) 60 MG capsule Take 60 mg by mouth daily.    [provider]  fluocinonide (LIDEX) 0.05 % external solution Apply 1 application topically 2 (two) times daily as needed (skin irritation).     [provider]  Fluticasone-Umeclidin-Vilant (TRELEGY ELLIPTA) 100-62.5-25 MCG/INH AEPB Inhale 1 puff into the lungs daily. 08/10/17   Mannam, Hart Robinsons, MD  HYDROcodone-acetaminophen (NORCO) 7.5-325 MG tablet Take 1 tablet by mouth every 6 (six) hours as needed for up to 30 days for moderate pain or severe pain. 08/01/18 08/31/18  Molli Barrows, MD  HYDROcodone-acetaminophen (NORCO) 7.5-325 MG tablet Take 1 tablet by mouth every  6 (six) hours as needed for up to 30 days for moderate pain or severe pain. 08/31/18 09/30/18  Molli Barrows, MD  LORazepam (ATIVAN) 0.5 MG tablet Take 1 tablet (0.5 mg total) by mouth at bedtime for 30 days. 08/01/18 08/31/18  Molli Barrows, MD  meloxicam (MOBIC) 15 MG tablet Take 1 tablet (15 mg total) by mouth daily. 06/07/17   Minus Breeding, MD  methocarbamol (ROBAXIN) 500 MG tablet Take 1 tablet (500 mg total) by mouth 4 (four) times daily. 09/30/17   Molli Barrows, MD  Multiple Vitamin (MULTI-VITAMIN PO) Take 1 tablet by mouth daily.     [provider]  Omega-3 Fatty Acids (FISH OIL  PO) Take 1,000 mg by mouth daily.     [provider]  omeprazole (PRILOSEC) 40 MG capsule Take 1 capsule by mouth daily. 04/08/17   [provider]  Ospemifene (OSPHENA) 60 MG TABS Take 60 mg by mouth every other day.     [provider]  oxyCODONE-acetaminophen (PERCOCET/ROXICET) 5-325 MG tablet Take 1 tablet by mouth every 6 (six) hours as needed. 12/30/17   [provider]  PREMARIN vaginal cream Place 1 Applicatorful vaginally daily as needed.  01/27/16   [provider]  valsartan-hydrochlorothiazide (DIOVAN-HCT) 160-12.5 MG tablet Take 1 tablet by mouth daily.  11/19/15   [provider]    Allergies Morphine, Advair diskus [fluticasone-salmeterol], Ambien [zolpidem tartrate], Amlodipine besylate, Bextra [valdecoxib], Captopril, Latex, Lisinopril, Oxycodone hcl, Pulmicort [budesonide], Savella [milnacipran hcl], and Spiriva [tiotropium bromide monohydrate]  Family History  Problem Relation Age of Onset  . Hypertension Mother   . Diabetes Mother   . Allergies Mother   . Heart attack Father 34  . Cancer Father        kidney  . Emphysema Father   . Heart disease Father        CHF age 76  . Heart attack Brother 54       Died of MI age 72    Social History Social History   Tobacco Use  . Smoking status: Former Smoker    Packs/day: 2.00    Years: 40.00    Pack years: 80.00    Types: Cigarettes    Quit date: 02/24/1999    Years since quitting: 19.5  . Smokeless tobacco: Never Used  Substance Use Topics  . Alcohol use: Yes    Comment: occassioal  . Drug use: No    Review of Systems Constitutional: No fever/chills Eyes: No visual changes. ENT: No sore throat. Cardiovascular: Denies chest pain. Respiratory: Denies shortness of breath. Gastrointestinal: No abdominal pain.  No nausea, no vomiting.  No diarrhea.   Genitourinary: Negative for dysuria. Musculoskeletal: Positive for right arm pain.  Skin: Negative for  rash. Neurological: Negative for headaches, focal weakness or numbness.  ____________________________________________   PHYSICAL EXAM:  VITAL SIGNS: ED Triage Vitals  Enc Vitals Group     BP 08/30/18 0136 (!) 154/96     Pulse Rate 08/30/18 0136 81     Resp 08/30/18 0136 20     Temp 08/30/18 0136 98.9 F (37.2 C)     Temp Source 08/30/18 0136 Oral     SpO2 08/30/18 0136 96 %     Weight 08/30/18 0137 195 lb 15.8 oz (88.9 kg)     Height 08/30/18 0137 5\' 7"  (1.702 m)     Head Circumference --      Peak Flow --      Pain Score 08/30/18  0137 10   Constitutional: Alert and oriented.  Eyes: Conjunctivae are normal.  ENT      Head: Normocephalic and atraumatic.      Nose: No congestion/rhinnorhea.      Mouth/Throat: Mucous membranes are moist.      Neck: No stridor. Hematological/Lymphatic/Immunilogical: No cervical lymphadenopathy. Cardiovascular: Normal rate, regular rhythm.  No murmurs, rubs, or gallops.  Respiratory: Normal respiratory effort without tachypnea nor retractions. Breath sounds are clear and equal bilaterally. No wheezes/rales/rhonchi. Gastrointestinal: Soft and non tender. No rebound. No guarding.  Genitourinary: Deferred Musculoskeletal: Right arm without any deformity or swelling. RP 2+. Sensation intact. Extremely tender to even the lightest touch.  Neurologic:  Normal speech and language. No gross focal neurologic deficits are appreciated.  Skin:  Skin is warm, dry and intact. No rash noted. Psychiatric: Mood and affect are normal. Speech and behavior are normal. Patient exhibits appropriate insight and judgment.  ____________________________________________    LABS (pertinent positives/negatives)  None  ____________________________________________   EKG  None  ____________________________________________     RADIOLOGY  None  ____________________________________________   PROCEDURES  Procedures  ____________________________________________   INITIAL IMPRESSION / ASSESSMENT AND PLAN / ED COURSE  Pertinent labs & imaging results that were available during my care of the patient were reviewed by me and considered in my medical decision making (see chart for details).   Patient presented to the emergency department today because of concern for right arm pain. On exam arm appears normal, good pulses and sensation is intact. Do wonder if pain is related to either radiculopathy or fibromyalgia. Will attempt pain control in the emergency department.   ____________________________________________   FINAL CLINICAL IMPRESSION(S) / ED DIAGNOSES  Final diagnoses:  Right arm pain     Note: This dictation was prepared with Dragon dictation. Any transcriptional errors that result from this process are unintentional     Nance Pear, MD 08/30/18 [phone]

## 2018-08-30 NOTE — ED Triage Notes (Signed)
Patient to ER for c/o right sided hand pain. Patient states she woke up with a little soreness in hand this am, but has progressively gotten worse. Patient reports h/o arthritis in one of her fingers on same hand, but has never been told anything particular about her hand. Denies any history of any nerve damage/nerve issues/injury to hand. Patient reports trying arthritis cream at home with no relief.

## 2018-08-30 NOTE — Discharge Instructions (Addendum)
Please seek medical attention for any high fevers, chest pain, shortness of breath, change in behavior, persistent vomiting, bloody stool or any other new or concerning symptoms.  

## 2018-08-30 NOTE — ED Notes (Signed)
Pt up to use bathroom 

## 2018-08-30 NOTE — ED Notes (Signed)
EDP at bedside  

## 2018-08-30 NOTE — ED Notes (Signed)
Patient states pain to hand was making her nauseated. Patient given Zofran while waiting.

## 2018-09-20 ENCOUNTER — Other Ambulatory Visit: Payer: Self-pay

## 2018-09-20 ENCOUNTER — Encounter: Payer: Self-pay | Admitting: *Deleted

## 2018-09-21 NOTE — Anesthesia Preprocedure Evaluation (Addendum)
Anesthesia Evaluation  Patient identified by MRN, date of birth, ID band Patient awake    Reviewed: Allergy & Precautions, NPO status , Patient's Chart, lab work & pertinent test results  History of Anesthesia Complications Negative for: history of anesthetic complications  Airway Mallampati: III   Neck ROM: Full    Dental  (+) Lower Dentures, Upper Dentures   Pulmonary asthma , sleep apnea , COPD, former smoker (quit 2001),    Pulmonary exam normal breath sounds clear to auscultation       Cardiovascular hypertension, + CAD (s/p MI)  Normal cardiovascular exam Rhythm:Regular Rate:Normal  Cardiac cath 06/08/17:   Ost LAD to Prox LAD lesion is 5% stenosed.  The left ventricular systolic function is normal.  LV end diastolic pressure is normal.   Evidence for myocardial calcification involving the superior aspect of the very proximal near ostial LAD without significant focal intraluminal stenosis.  Normal intermediate, normal left circumflex, and normal large dominant RCA.  Normal LV function with an EF at approximately 55%.  RECOMMENDATION: Medical therapy.  The patient's chest pain has some atypical features; consider possible noncardiac etiology.   Neuro/Psych Chronic lower back pain    GI/Hepatic GERD  ,  Endo/Other  Hyperthyroidism   Renal/GU negative Renal ROS     Musculoskeletal  (+) Arthritis , Osteoarthritis and Rheumatoid disorders,  Fibromyalgia -  Abdominal   Peds  Hematology negative hematology ROS (+)   Anesthesia Other Findings   Reproductive/Obstetrics                            Anesthesia Physical Anesthesia Plan  ASA: III  Anesthesia Plan: MAC   Post-op Pain Management:    Induction: Intravenous  PONV Risk Score and Plan: 2 and TIVA and Midazolam  Airway Management Planned: Natural Airway  Additional Equipment:   Intra-op Plan:   Post-operative  Plan:   Informed Consent: I have reviewed the patients History and Physical, chart, labs and discussed the procedure including the risks, benefits and alternatives for the proposed anesthesia with the patient or authorized representative who has indicated his/her understanding and acceptance.       Plan Discussed with: CRNA  Anesthesia Plan Comments:        Anesthesia Quick Evaluation

## 2018-09-22 NOTE — Discharge Instructions (Signed)

## 2018-09-23 ENCOUNTER — Encounter
Admission: RE | Admit: 2018-09-23 | Discharge: 2018-09-23 | Disposition: A | Discharge status: Home / Self Care | Payer: Medicare Other | Source: Ambulatory Visit | Attending: Ophthalmology | Admitting: Ophthalmology

## 2018-09-23 DIAGNOSIS — Z20828 Contact with and (suspected) exposure to other viral communicable diseases: Secondary | ICD-10-CM | POA: Diagnosis present

## 2018-09-23 LAB — SARS CORONAVIRUS 2 (TAT 6-24 HRS): SARS Coronavirus 2: NEGATIVE

## 2018-09-25 ENCOUNTER — Other Ambulatory Visit: Payer: Self-pay

## 2018-09-27 ENCOUNTER — Other Ambulatory Visit: Payer: Self-pay

## 2018-09-27 ENCOUNTER — Encounter
Admission: RE | Disposition: A | Discharge status: Home / Self Care | Payer: Self-pay | Source: Home / Self Care | Attending: Ophthalmology

## 2018-09-27 ENCOUNTER — Ambulatory Visit: Payer: Medicare Other | Admitting: Anesthesiology

## 2018-09-27 ENCOUNTER — Ambulatory Visit
Admission: RE | Admit: 2018-09-27 | Discharge: 2018-09-27 | Disposition: A | Discharge status: Home / Self Care | Payer: Medicare Other | Attending: Ophthalmology | Admitting: Ophthalmology

## 2018-09-27 DIAGNOSIS — Z791 Long term (current) use of non-steroidal anti-inflammatories (NSAID): Secondary | ICD-10-CM | POA: Diagnosis not present

## 2018-09-27 DIAGNOSIS — Z9861 Coronary angioplasty status: Secondary | ICD-10-CM | POA: Diagnosis not present

## 2018-09-27 DIAGNOSIS — I252 Old myocardial infarction: Secondary | ICD-10-CM | POA: Diagnosis not present

## 2018-09-27 DIAGNOSIS — Z885 Allergy status to narcotic agent status: Secondary | ICD-10-CM | POA: Diagnosis not present

## 2018-09-27 DIAGNOSIS — H2512 Age-related nuclear cataract, left eye: Secondary | ICD-10-CM | POA: Insufficient documentation

## 2018-09-27 DIAGNOSIS — I2510 Atherosclerotic heart disease of native coronary artery without angina pectoris: Secondary | ICD-10-CM | POA: Diagnosis not present

## 2018-09-27 DIAGNOSIS — Z7952 Long term (current) use of systemic steroids: Secondary | ICD-10-CM | POA: Insufficient documentation

## 2018-09-27 DIAGNOSIS — J449 Chronic obstructive pulmonary disease, unspecified: Secondary | ICD-10-CM | POA: Insufficient documentation

## 2018-09-27 DIAGNOSIS — M797 Fibromyalgia: Secondary | ICD-10-CM | POA: Insufficient documentation

## 2018-09-27 DIAGNOSIS — F329 Major depressive disorder, single episode, unspecified: Secondary | ICD-10-CM | POA: Insufficient documentation

## 2018-09-27 DIAGNOSIS — K219 Gastro-esophageal reflux disease without esophagitis: Secondary | ICD-10-CM | POA: Insufficient documentation

## 2018-09-27 DIAGNOSIS — J45909 Unspecified asthma, uncomplicated: Secondary | ICD-10-CM | POA: Insufficient documentation

## 2018-09-27 DIAGNOSIS — M069 Rheumatoid arthritis, unspecified: Secondary | ICD-10-CM | POA: Insufficient documentation

## 2018-09-27 DIAGNOSIS — Z87891 Personal history of nicotine dependence: Secondary | ICD-10-CM | POA: Diagnosis not present

## 2018-09-27 DIAGNOSIS — Z79899 Other long term (current) drug therapy: Secondary | ICD-10-CM | POA: Insufficient documentation

## 2018-09-27 DIAGNOSIS — E0590 Thyrotoxicosis, unspecified without thyrotoxic crisis or storm: Secondary | ICD-10-CM | POA: Insufficient documentation

## 2018-09-27 DIAGNOSIS — Z79891 Long term (current) use of opiate analgesic: Secondary | ICD-10-CM | POA: Insufficient documentation

## 2018-09-27 DIAGNOSIS — M1990 Unspecified osteoarthritis, unspecified site: Secondary | ICD-10-CM | POA: Diagnosis not present

## 2018-09-27 DIAGNOSIS — I10 Essential (primary) hypertension: Secondary | ICD-10-CM | POA: Diagnosis not present

## 2018-09-27 DIAGNOSIS — G4730 Sleep apnea, unspecified: Secondary | ICD-10-CM | POA: Diagnosis not present

## 2018-09-27 HISTORY — PX: CATARACT EXTRACTION W/PHACO: SHX586

## 2018-09-27 SURGERY — PHACOEMULSIFICATION, CATARACT, WITH IOL INSERTION
Anesthesia: Monitor Anesthesia Care | Site: Eye | Laterality: Left

## 2018-09-27 MED ORDER — ONDANSETRON HCL 4 MG/2ML IJ SOLN: 4.0000 mg | Freq: Once | INTRAMUSCULAR | Status: DC | PRN

## 2018-09-27 MED ORDER — NA CHONDROIT SULF-NA HYALURON 40-17 MG/ML IO SOLN
INTRAOCULAR | Status: DC | PRN
  Administered 2018-09-27: 1 mL via INTRAOCULAR

## 2018-09-27 MED ORDER — FENTANYL CITRATE (PF) 100 MCG/2ML IJ SOLN
INTRAMUSCULAR | Status: DC | PRN
  Administered 2018-09-27: 50 ug via INTRAVENOUS

## 2018-09-27 MED ORDER — EPINEPHRINE PF 1 MG/ML IJ SOLN
INTRAOCULAR | Status: DC | PRN
  Administered 2018-09-27: 60 mL via OPHTHALMIC

## 2018-09-27 MED ORDER — BRIMONIDINE TARTRATE-TIMOLOL 0.2-0.5 % OP SOLN
OPHTHALMIC | Status: DC | PRN
  Administered 2018-09-27: 1 [drp] via OPHTHALMIC

## 2018-09-27 MED ORDER — MOXIFLOXACIN HCL 0.5 % OP SOLN
OPHTHALMIC | Status: DC | PRN
  Administered 2018-09-27: 0.2 mL via OPHTHALMIC

## 2018-09-27 MED ORDER — ACETAMINOPHEN 325 MG PO TABS: 650.0000 mg | ORAL_TABLET | Freq: Once | ORAL | Status: DC | PRN

## 2018-09-27 MED ORDER — LIDOCAINE HCL (PF) 2 % IJ SOLN
INTRAOCULAR | Status: DC | PRN
  Administered 2018-09-27: 1 mL

## 2018-09-27 MED ORDER — TETRACAINE HCL 0.5 % OP SOLN
1.0000 [drp] | OPHTHALMIC | Status: DC | PRN
  Administered 2018-09-27 (×3): 1 [drp] via OPHTHALMIC

## 2018-09-27 MED ORDER — ACETAMINOPHEN 160 MG/5ML PO SOLN: 325.0000 mg | ORAL | Status: DC | PRN

## 2018-09-27 MED ORDER — MIDAZOLAM HCL 2 MG/2ML IJ SOLN
INTRAMUSCULAR | Status: DC | PRN
  Administered 2018-09-27: 2 mg via INTRAVENOUS

## 2018-09-27 MED ORDER — ARMC OPHTHALMIC DILATING DROPS
1.0000 "application " | OPHTHALMIC | Status: DC | PRN
  Administered 2018-09-27 (×3): 1 via OPHTHALMIC

## 2018-09-27 SURGICAL SUPPLY — 19 items
CANNULA ANT/CHMB 27G (MISCELLANEOUS) ×1 IMPLANT
CANNULA ANT/CHMB 27GA (MISCELLANEOUS) ×2 IMPLANT
GLOVE SURG LX 8.0 MICRO (GLOVE) ×1
GLOVE SURG LX STRL 8.0 MICRO (GLOVE) ×1 IMPLANT
GLOVE SURG TRIUMPH 8.0 PF LTX (GLOVE) ×2 IMPLANT
GOWN STRL REUS W/ TWL LRG LVL3 (GOWN DISPOSABLE) ×2 IMPLANT
GOWN STRL REUS W/TWL LRG LVL3 (GOWN DISPOSABLE) ×4
LENS IOL TECNIS ITEC 20.0 (Intraocular Lens) ×1 IMPLANT
MARKER SKIN DUAL TIP RULER LAB (MISCELLANEOUS) ×2 IMPLANT
NDL FILTER BLUNT 18X1 1/2 (NEEDLE) ×1 IMPLANT
NDL RETROBULBAR .5 NSTRL (NEEDLE) ×2 IMPLANT
NEEDLE FILTER BLUNT 18X 1/2SAF (NEEDLE) ×1
NEEDLE FILTER BLUNT 18X1 1/2 (NEEDLE) ×1 IMPLANT
PACK EYE AFTER SURG (MISCELLANEOUS) ×2 IMPLANT
PACK OPTHALMIC (MISCELLANEOUS) ×2 IMPLANT
PACK PORFILIO (MISCELLANEOUS) ×2 IMPLANT
SYR 3ML LL SCALE MARK (SYRINGE) ×2 IMPLANT
SYR TB 1ML LUER SLIP (SYRINGE) ×2 IMPLANT
WIPE NON LINTING 3.25X3.25 (MISCELLANEOUS) ×2 IMPLANT

## 2018-09-27 NOTE — Op Note (Signed)
PREOPERATIVE DIAGNOSIS:  Nuclear sclerotic cataract of the left eye.   POSTOPERATIVE DIAGNOSIS:  Nuclear sclerotic cataract of the left eye.   OPERATIVE PROCEDURE: Procedure(s): CATARACT EXTRACTION PHACO AND INTRAOCULAR LENS PLACEMENT (IOC) LEFT   SURGEON:  Birder Robson, MD.   ANESTHESIA:  Anesthesiologist: Darrin Nipper, MD CRNA: Jeannene Patella, CRNA  1.      Managed anesthesia care. 2.     0.7ml of Shugarcaine was instilled following the paracentesis   COMPLICATIONS:  None.   TECHNIQUE:   Stop and chop   DESCRIPTION OF PROCEDURE:  The patient was examined and consented in the preoperative holding area where the aforementioned topical anesthesia was applied to the left eye and then brought back to the Operating Room where the left eye was prepped and draped in the usual sterile ophthalmic fashion and a lid speculum was placed. A paracentesis was created with the side port blade and the anterior chamber was filled with viscoelastic. A near clear corneal incision was performed with the steel keratome. A continuous curvilinear capsulorrhexis was performed with a cystotome followed by the capsulorrhexis forceps. Hydrodissection and hydrodelineation were carried out with BSS on a blunt cannula. The lens was removed in a stop and chop  technique and the remaining cortical material was removed with the irrigation-aspiration handpiece. The capsular bag was inflated with viscoelastic and the Technis [id number] lens was placed in the capsular bag without complication. The remaining viscoelastic was removed from the eye with the irrigation-aspiration handpiece. The wounds were hydrated. The anterior chamber was flushed with BSS and the eye was inflated to physiologic pressure. 0.71ml Vigamox was placed in the anterior chamber. The wounds were found to be water tight. The eye was dressed with Combigan. The patient was given protective glasses to wear throughout the day and a shield with which to sleep  tonight. The patient was also given drops with which to begin a drop regimen today and will follow-up with me in one day. Implant Name Type Inv. Item Serial No. Manufacturer Lot No. LRB No. Used Action  LENS IOL DIOP 20.0 - [id number] Intraocular Lens LENS IOL DIOP 20.0 [id number] AMO  Left 1 Implanted    Procedure(s): CATARACT EXTRACTION PHACO AND INTRAOCULAR LENS PLACEMENT (IOC) LEFT (Left)  Electronically signed: Birder Robson 09/27/2018 10:47 AM

## 2018-09-27 NOTE — H&P (Signed)
All labs reviewed. Abnormal studies sent to patients PCP when indicated.  Previous H&P reviewed, patient examined, there are NO CHANGES.  Kelsey Diaz Porfilio8/4/202010:21 AM

## 2018-09-27 NOTE — Anesthesia Postprocedure Evaluation (Signed)
Anesthesia Post Note  Patient: Kelsey Diaz  Procedure(s) Performed: CATARACT EXTRACTION PHACO AND INTRAOCULAR LENS PLACEMENT (IOC) LEFT (Left Eye)  Patient location during evaluation: PACU Anesthesia Type: MAC Level of consciousness: awake and alert, oriented and patient cooperative Pain management: pain level controlled Vital Signs Assessment: post-procedure vital signs reviewed and stable Respiratory status: spontaneous breathing, nonlabored ventilation and respiratory function stable Cardiovascular status: blood pressure returned to baseline and stable Postop Assessment: adequate PO intake Anesthetic complications: no    Darrin Nipper

## 2018-09-27 NOTE — Transfer of Care (Signed)
Immediate Anesthesia Transfer of Care Note  Patient: Kelsey Diaz  Procedure(s) Performed: CATARACT EXTRACTION PHACO AND INTRAOCULAR LENS PLACEMENT (IOC) LEFT (Left Eye)  Patient Location: PACU  Anesthesia Type: MAC  Level of Consciousness: awake, alert  and patient cooperative  Airway and Oxygen Therapy: Patient Spontanous Breathing and Patient connected to supplemental oxygen  Post-op Assessment: Post-op Vital signs reviewed, Patient's Cardiovascular Status Stable, Respiratory Function Stable, Patent Airway and No signs of Nausea or vomiting  Post-op Vital Signs: Reviewed and stable  Complications: No apparent anesthesia complications

## 2018-09-28 ENCOUNTER — Encounter: Payer: Self-pay | Admitting: Ophthalmology

## 2018-10-03 ENCOUNTER — Ambulatory Visit: Payer: Medicare Other | Attending: Anesthesiology | Admitting: Anesthesiology

## 2018-10-03 ENCOUNTER — Encounter: Payer: Self-pay | Admitting: Anesthesiology

## 2018-10-03 ENCOUNTER — Other Ambulatory Visit: Payer: Self-pay

## 2018-10-03 DIAGNOSIS — M5431 Sciatica, right side: Secondary | ICD-10-CM | POA: Diagnosis not present

## 2018-10-03 DIAGNOSIS — G894 Chronic pain syndrome: Secondary | ICD-10-CM | POA: Diagnosis not present

## 2018-10-03 DIAGNOSIS — M5432 Sciatica, left side: Secondary | ICD-10-CM

## 2018-10-03 DIAGNOSIS — M069 Rheumatoid arthritis, unspecified: Secondary | ICD-10-CM

## 2018-10-03 DIAGNOSIS — M47817 Spondylosis without myelopathy or radiculopathy, lumbosacral region: Secondary | ICD-10-CM

## 2018-10-03 DIAGNOSIS — F1190 Opioid use, unspecified, uncomplicated: Secondary | ICD-10-CM

## 2018-10-03 DIAGNOSIS — M4716 Other spondylosis with myelopathy, lumbar region: Secondary | ICD-10-CM | POA: Diagnosis not present

## 2018-10-03 DIAGNOSIS — M5136 Other intervertebral disc degeneration, lumbar region: Secondary | ICD-10-CM | POA: Diagnosis not present

## 2018-10-03 DIAGNOSIS — M0579 Rheumatoid arthritis with rheumatoid factor of multiple sites without organ or systems involvement: Secondary | ICD-10-CM

## 2018-10-03 HISTORY — DX: Rheumatoid arthritis, unspecified: M06.9

## 2018-10-03 MED ORDER — HYDROCODONE-ACETAMINOPHEN 7.5-325 MG PO TABS: 1.0000 | ORAL_TABLET | Freq: Four times a day (QID) | ORAL | 0 refills | Status: AC | PRN

## 2018-10-03 MED ORDER — HYDROCODONE-ACETAMINOPHEN 7.5-325 MG PO TABS: 1.0000 | ORAL_TABLET | Freq: Four times a day (QID) | ORAL | 0 refills | Status: DC | PRN

## 2018-10-03 NOTE — Progress Notes (Signed)
Virtual Visit via Telephone Note  I connected with Kelsey Diaz on 10/03/18 at  3:15 PM EDT by telephone and verified that I am speaking with the correct person using two identifiers.  Location: Patient: Home Provider: Pain control center   I discussed the limitations, risks, security and privacy concerns of performing an evaluation and management service by telephone and the availability of in person appointments. I also discussed with the patient that there may be a patient responsible charge related to this service. The patient expressed understanding and agreed to proceed.   History of Present Illness: I spoke with Kelsey Diaz today regarding her low back pain and it has been significantly worse.  She also reports recent diagnosis of rheumatoid arthritis primarily affecting her hands and fingers in addition to hips.  She has been taking her hydrocodone 7.5 mg tablets effectively but has recently run out.  She notes a big difference and severe increase in pain when she is not taking them.  She denies any side effects of the medication and is getting better relief with good sleep while taking them.  She was recently started on methotrexate.  Overall the quality characteristic and distribution of her low back pain and other pains have been stable in nature with no recent changes.    Observations/Objective: Current Outpatient Medications:  .  albuterol (PROAIR HFA) 108 (90 BASE) MCG/ACT inhaler, Inhale 2 puffs into the lungs every 6 (six) hours as needed for shortness of breath. , Disp: , Rfl:  .  Calcium Carbonate-Vitamin D (CALCIUM + D) 600-200 MG-UNIT TABS, Take 1 tablet by mouth daily. , Disp: , Rfl:  .  clobetasol cream (TEMOVATE) 9.37 %, Apply 1 application topically 2 (two) times daily as needed (irritation). , Disp: , Rfl:  .  DULoxetine (CYMBALTA) 60 MG capsule, Take 90 mg by mouth daily. , Disp: , Rfl:  .  fluocinonide (LIDEX) 0.05 % external solution, Apply 1 application topically 2  (two) times daily as needed (skin irritation). , Disp: , Rfl:  .  Fluticasone-Umeclidin-Vilant (TRELEGY ELLIPTA) 100-62.5-25 MCG/INH AEPB, Inhale 1 puff into the lungs daily. (Patient not taking: Reported on 09/20/2018), Disp: 1 each, Rfl: 6 .  folic acid (FOLVITE) 1 MG tablet, Take 1 mg by mouth daily., Disp: , Rfl:  .  Glucosamine-Chondroitin (MOVE FREE PO), Take by mouth daily., Disp: , Rfl:  .  HYDROcodone-acetaminophen (NORCO) 7.5-325 MG tablet, Take 1 tablet by mouth every 6 (six) hours as needed for moderate pain or severe pain., Disp: 90 tablet, Rfl: 0 .  [START ON 11/02/2018] HYDROcodone-acetaminophen (NORCO) 7.5-325 MG tablet, Take 1 tablet by mouth every 6 (six) hours as needed for moderate pain or severe pain., Disp: 90 tablet, Rfl: 0 .  meloxicam (MOBIC) 15 MG tablet, Take 1 tablet (15 mg total) by mouth daily., Disp: , Rfl:  .  methocarbamol (ROBAXIN) 500 MG tablet, Take 1 tablet (500 mg total) by mouth 4 (four) times daily. (Patient not taking: Reported on 09/27/2018), Disp: 120 tablet, Rfl: 2 .  methotrexate (RHEUMATREX) 2.5 MG tablet, Take 10 mg by mouth once a week. Caution:Chemotherapy. Protect from light., Disp: , Rfl:  .  Multiple Vitamin (MULTI-VITAMIN PO), Take 1 tablet by mouth daily. , Disp: , Rfl:  .  Omega-3 Fatty Acids (FISH OIL PO), Take 1,000 mg by mouth daily. , Disp: , Rfl:  .  omeprazole (PRILOSEC) 40 MG capsule, Take 1 capsule by mouth daily., Disp: , Rfl: 1 .  predniSONE (DELTASONE) 10 MG tablet,  Take 10 mg by mouth daily with breakfast., Disp: , Rfl:  .  PREMARIN vaginal cream, Place 1 Applicatorful vaginally daily as needed. , Disp: , Rfl:  .  Probiotic Product (PROBIOTIC DAILY PO), Take by mouth., Disp: , Rfl:  .  valsartan-hydrochlorothiazide (DIOVAN-HCT) 160-12.5 MG tablet, Take 0.5 tablets by mouth daily. , Disp: , Rfl:   Current Facility-Administered Medications:  .  betamethasone acetate-betamethasone sodium phosphate (CELESTONE) injection 3 mg, 3 mg,  Intramuscular, Once, Edrick Kins, DPM   Assessment and Plan: 1. DDD (degenerative disc disease), lumbar   2. Bilateral sciatica   3. Lumbar spondylosis with myelopathy   4. Chronic pain syndrome   5. Chronic, continuous use of opioids   6. Facet arthritis of lumbosacral region   7. Rheumatoid arthritis involving multiple sites with positive rheumatoid factor ([place])   Based on our discussion today and upon review of the [facility] practitioner database information I am going to refill her medications for August 10 and September 9.  I want her to continue with her current stretching strengthening exercises as tolerated for her low back pain and start her methotrexate as per her rheumatology doctors with continued follow-up with them.  For pain related questions and problems she is to continue follow-up with [place] with return to clinic in 2 months.  Follow Up Instructions:    I discussed the assessment and treatment plan with the patient. The patient was provided an opportunity to ask questions and all were answered. The patient agreed with the plan and demonstrated an understanding of the instructions.   The patient was advised to call back or seek an in-person evaluation if the symptoms worsen or if the condition fails to improve as anticipated.  I provided 30 minutes of non-face-to-face time during this encounter.   Molli Barrows, MD

## 2018-10-06 ENCOUNTER — Ambulatory Visit: Payer: Medicare Other | Admitting: Anesthesiology

## 2018-11-23 ENCOUNTER — Encounter: Payer: Self-pay | Admitting: *Deleted

## 2018-11-23 ENCOUNTER — Other Ambulatory Visit: Payer: Self-pay

## 2018-11-25 ENCOUNTER — Other Ambulatory Visit
Admission: RE | Admit: 2018-11-25 | Discharge: 2018-11-25 | Disposition: A | Discharge status: Home / Self Care | Payer: Medicare Other | Source: Ambulatory Visit | Attending: Ophthalmology | Admitting: Ophthalmology

## 2018-11-25 ENCOUNTER — Other Ambulatory Visit: Payer: Self-pay

## 2018-11-25 DIAGNOSIS — Z01812 Encounter for preprocedural laboratory examination: Secondary | ICD-10-CM | POA: Insufficient documentation

## 2018-11-25 DIAGNOSIS — Z20828 Contact with and (suspected) exposure to other viral communicable diseases: Secondary | ICD-10-CM | POA: Diagnosis not present

## 2018-11-25 NOTE — Discharge Instructions (Signed)

## 2018-11-26 LAB — SARS CORONAVIRUS 2 (TAT 6-24 HRS): SARS Coronavirus 2: NEGATIVE

## 2018-11-28 ENCOUNTER — Encounter: Payer: Self-pay | Admitting: Anesthesiology

## 2018-11-28 ENCOUNTER — Other Ambulatory Visit: Payer: Self-pay

## 2018-11-28 ENCOUNTER — Ambulatory Visit: Payer: Medicare Other | Attending: Anesthesiology | Admitting: Anesthesiology

## 2018-11-28 DIAGNOSIS — G894 Chronic pain syndrome: Secondary | ICD-10-CM

## 2018-11-28 DIAGNOSIS — M51369 Other intervertebral disc degeneration, lumbar region without mention of lumbar back pain or lower extremity pain: Secondary | ICD-10-CM

## 2018-11-28 DIAGNOSIS — M79643 Pain in unspecified hand: Secondary | ICD-10-CM

## 2018-11-28 DIAGNOSIS — F1190 Opioid use, unspecified, uncomplicated: Secondary | ICD-10-CM

## 2018-11-28 DIAGNOSIS — M0579 Rheumatoid arthritis with rheumatoid factor of multiple sites without organ or systems involvement: Secondary | ICD-10-CM | POA: Diagnosis not present

## 2018-11-28 DIAGNOSIS — M5432 Sciatica, left side: Secondary | ICD-10-CM

## 2018-11-28 DIAGNOSIS — M4716 Other spondylosis with myelopathy, lumbar region: Secondary | ICD-10-CM

## 2018-11-28 DIAGNOSIS — M5136 Other intervertebral disc degeneration, lumbar region: Secondary | ICD-10-CM

## 2018-11-28 DIAGNOSIS — M5431 Sciatica, right side: Secondary | ICD-10-CM | POA: Diagnosis not present

## 2018-11-28 DIAGNOSIS — M79642 Pain in left hand: Secondary | ICD-10-CM

## 2018-11-28 DIAGNOSIS — M79641 Pain in right hand: Secondary | ICD-10-CM

## 2018-11-28 DIAGNOSIS — M25551 Pain in right hip: Secondary | ICD-10-CM

## 2018-11-28 DIAGNOSIS — M25552 Pain in left hip: Secondary | ICD-10-CM

## 2018-11-28 HISTORY — DX: Pain in left hip: M25.551

## 2018-11-28 HISTORY — DX: Pain in unspecified hand: M79.643

## 2018-11-28 MED ORDER — OXYCODONE-ACETAMINOPHEN 7.5-325 MG PO TABS: 1.0000 | ORAL_TABLET | Freq: Three times a day (TID) | ORAL | 0 refills | Status: DC | PRN

## 2018-11-28 MED ORDER — HYDROCODONE-ACETAMINOPHEN 7.5-325 MG PO TABS: 1.0000 | ORAL_TABLET | Freq: Four times a day (QID) | ORAL | 0 refills | Status: AC | PRN

## 2018-11-28 NOTE — Progress Notes (Signed)
Virtual Visit via Telephone Note  I connected with Brand Males on 11/28/18 at  3:00 PM EDT by telephone and verified that I am speaking with the correct person using two identifiers.  Location: Patient: Home Provider: Pain control center   I discussed the limitations, risks, security and privacy concerns of performing an evaluation and management service by telephone and the availability of in person appointments. I also discussed with the patient that there may be a patient responsible charge related to this service. The patient expressed understanding and agreed to proceed.   History of Present Illness: I spoke with Ms. Baird Cancer today regarding her chronic low back pain and diffuse body pain.  She has had more problems with her rheumatoid arthritis and more hand pain affecting the wrist and fingers.  She has recently started methotrexate per her rheumatologist.  Otherwise she is doing well with her medication management from the pain control center and this is working well for her keeping her pain under good control.  She denies any troubles with the medication no side effects are reported and this is giving her good relief.  Otherwise she is in her usual state of health this time.    Observations/Objective:No current facility-administered medications for this visit.  No current outpatient medications on file.  Facility-Administered Medications Ordered in Other Visits:  .  [facility] ophthalmic dilating drops (tropicamide 1%, cylcopentolate 1%, phenylephrine 2.5%, ketorolac 0.5% per 0.5 mL), 1 application, Right Eye, PRN, Birder Robson, MD, 1 application at [id number] .  tetracaine (PONTOCAINE) 0.5 % ophthalmic solution 1 drop, 1 drop, Right Eye, PRN, Birder Robson, MD, 1 drop at 11/29/18 0852   Assessment and Plan: 1. DDD (degenerative disc disease), lumbar   2. Bilateral sciatica   3. Lumbar spondylosis with myelopathy   4. Rheumatoid arthritis involving multiple sites with  positive rheumatoid factor ([place])   5. Chronic pain syndrome   6. Chronic, continuous use of opioids   7. Bilateral hip pain   8. Pain in both hands   Based upon our discussion today and review of the [facility] practitioner database information going to refill her medications for October 9 and November 8.  We will schedule her for return to clinic in 2 months.  I want her to continue follow-up with her rheumatology doctors for the methotrexate management and hopefully this will help aid in her generalized pain control.  She is instructed to contact us the pain control center should she have any problems with this in the meantime.   Follow Up Instructions:    I discussed the assessment and treatment plan with the patient. The patient was provided an opportunity to ask questions and all were answered. The patient agreed with the plan and demonstrated an understanding of the instructions.   The patient was advised to call back or seek an in-person evaluation if the symptoms worsen or if the condition fails to improve as anticipated.  I provided 30 minutes of non-face-to-face time during this encounter.   Molli Barrows, MD

## 2018-11-29 ENCOUNTER — Ambulatory Visit
Admission: RE | Admit: 2018-11-29 | Discharge: 2018-11-29 | Disposition: A | Discharge status: Home / Self Care | Payer: Medicare Other | Attending: Ophthalmology | Admitting: Ophthalmology

## 2018-11-29 ENCOUNTER — Ambulatory Visit: Payer: Medicare Other | Admitting: Anesthesiology

## 2018-11-29 ENCOUNTER — Encounter
Admission: RE | Disposition: A | Discharge status: Home / Self Care | Payer: Self-pay | Source: Home / Self Care | Attending: Ophthalmology

## 2018-11-29 ENCOUNTER — Other Ambulatory Visit: Payer: Self-pay

## 2018-11-29 DIAGNOSIS — H2511 Age-related nuclear cataract, right eye: Secondary | ICD-10-CM | POA: Diagnosis present

## 2018-11-29 DIAGNOSIS — J449 Chronic obstructive pulmonary disease, unspecified: Secondary | ICD-10-CM | POA: Diagnosis not present

## 2018-11-29 DIAGNOSIS — M797 Fibromyalgia: Secondary | ICD-10-CM | POA: Diagnosis not present

## 2018-11-29 DIAGNOSIS — Z87891 Personal history of nicotine dependence: Secondary | ICD-10-CM | POA: Diagnosis not present

## 2018-11-29 DIAGNOSIS — I252 Old myocardial infarction: Secondary | ICD-10-CM | POA: Diagnosis not present

## 2018-11-29 DIAGNOSIS — G4730 Sleep apnea, unspecified: Secondary | ICD-10-CM | POA: Insufficient documentation

## 2018-11-29 DIAGNOSIS — I10 Essential (primary) hypertension: Secondary | ICD-10-CM | POA: Diagnosis not present

## 2018-11-29 DIAGNOSIS — K219 Gastro-esophageal reflux disease without esophagitis: Secondary | ICD-10-CM | POA: Diagnosis not present

## 2018-11-29 DIAGNOSIS — Z79899 Other long term (current) drug therapy: Secondary | ICD-10-CM | POA: Insufficient documentation

## 2018-11-29 DIAGNOSIS — M1990 Unspecified osteoarthritis, unspecified site: Secondary | ICD-10-CM | POA: Insufficient documentation

## 2018-11-29 HISTORY — PX: CATARACT EXTRACTION W/PHACO: SHX586

## 2018-11-29 SURGERY — PHACOEMULSIFICATION, CATARACT, WITH IOL INSERTION
Anesthesia: Monitor Anesthesia Care | Site: Eye | Laterality: Right

## 2018-11-29 MED ORDER — MOXIFLOXACIN HCL 0.5 % OP SOLN
OPHTHALMIC | Status: DC | PRN
  Administered 2018-11-29: 0.2 mL via OPHTHALMIC

## 2018-11-29 MED ORDER — BRIMONIDINE TARTRATE-TIMOLOL 0.2-0.5 % OP SOLN
OPHTHALMIC | Status: DC | PRN
  Administered 2018-11-29: 1 [drp] via OPHTHALMIC

## 2018-11-29 MED ORDER — LIDOCAINE HCL (PF) 2 % IJ SOLN
INTRAOCULAR | Status: DC | PRN
  Administered 2018-11-29: 1 mL

## 2018-11-29 MED ORDER — TETRACAINE HCL 0.5 % OP SOLN
1.0000 [drp] | OPHTHALMIC | Status: DC | PRN
  Administered 2018-11-29 (×3): 1 [drp] via OPHTHALMIC

## 2018-11-29 MED ORDER — ACETAMINOPHEN 160 MG/5ML PO SOLN: 325.0000 mg | ORAL | Status: DC | PRN

## 2018-11-29 MED ORDER — ACETAMINOPHEN 325 MG PO TABS: 325.0000 mg | ORAL_TABLET | ORAL | Status: DC | PRN

## 2018-11-29 MED ORDER — EPINEPHRINE PF 1 MG/ML IJ SOLN
INTRAOCULAR | Status: DC | PRN
  Administered 2018-11-29: 72 mL via OPHTHALMIC

## 2018-11-29 MED ORDER — ARMC OPHTHALMIC DILATING DROPS
1.0000 "application " | OPHTHALMIC | Status: DC | PRN
  Administered 2018-11-29 (×3): 1 via OPHTHALMIC

## 2018-11-29 MED ORDER — MIDAZOLAM HCL 2 MG/2ML IJ SOLN
INTRAMUSCULAR | Status: DC | PRN
  Administered 2018-11-29 (×2): 1 mg via INTRAVENOUS

## 2018-11-29 MED ORDER — NA CHONDROIT SULF-NA HYALURON 40-17 MG/ML IO SOLN
INTRAOCULAR | Status: DC | PRN
  Administered 2018-11-29: 1 mL via INTRAOCULAR

## 2018-11-29 MED ORDER — FENTANYL CITRATE (PF) 100 MCG/2ML IJ SOLN
INTRAMUSCULAR | Status: DC | PRN
  Administered 2018-11-29: 50 ug via INTRAVENOUS

## 2018-11-29 SURGICAL SUPPLY — 21 items
CANNULA ANT/CHMB 27G (MISCELLANEOUS) ×2 IMPLANT
CANNULA ANT/CHMB 27GA (MISCELLANEOUS) ×4 IMPLANT
GLOVE BIOGEL PI IND STRL 7.5 (GLOVE) IMPLANT
GLOVE BIOGEL PI INDICATOR 7.5 (GLOVE) ×3
GOWN STRL REUS W/ TWL LRG LVL3 (GOWN DISPOSABLE) ×2 IMPLANT
GOWN STRL REUS W/TWL LRG LVL3 (GOWN DISPOSABLE) ×4
LENS IOL TECNIS ITEC 20.0 (Intraocular Lens) ×1 IMPLANT
MARKER SKIN DUAL TIP RULER LAB (MISCELLANEOUS) ×2 IMPLANT
NDL FILTER BLUNT 18X1 1/2 (NEEDLE) ×1 IMPLANT
NDL RETROBULBAR .5 NSTRL (NEEDLE) ×2 IMPLANT
NEEDLE FILTER BLUNT 18X 1/2SAF (NEEDLE) ×1
NEEDLE FILTER BLUNT 18X1 1/2 (NEEDLE) ×1 IMPLANT
PACK EYE AFTER SURG (MISCELLANEOUS) ×2 IMPLANT
PACK OPTHALMIC (MISCELLANEOUS) ×2 IMPLANT
PACK PORFILIO (MISCELLANEOUS) ×2 IMPLANT
SUT ETHILON 10-0 CS-B-6CS-B-6 (SUTURE)
SUTURE EHLN 10-0 CS-B-6CS-B-6 (SUTURE) IMPLANT
SYR 3ML LL SCALE MARK (SYRINGE) ×2 IMPLANT
SYR TB 1ML LUER SLIP (SYRINGE) ×2 IMPLANT
WATER STERILE IRR 250ML POUR (IV SOLUTION) ×2 IMPLANT
WIPE NON LINTING 3.25X3.25 (MISCELLANEOUS) ×2 IMPLANT

## 2018-11-29 NOTE — Anesthesia Procedure Notes (Signed)
Procedure Name: MAC Date/Time: 11/29/2018 9:24 AM Performed by: Georga Bora, CRNA Pre-anesthesia Checklist: Patient identified, Emergency Drugs available, Patient being monitored, Timeout performed and Suction available Patient Re-evaluated:Patient Re-evaluated prior to induction Oxygen Delivery Method: Nasal cannula

## 2018-11-29 NOTE — H&P (Signed)
All labs reviewed. Abnormal studies sent to patients PCP when indicated.  Previous H&P reviewed, patient examined, there are NO CHANGES.  Britni Driscoll Porfilio10/6/20209:08 AM

## 2018-11-29 NOTE — Anesthesia Postprocedure Evaluation (Signed)
Anesthesia Post Note  Patient: Kelsey Diaz  Procedure(s) Performed: CATARACT EXTRACTION PHACO AND INTRAOCULAR LENS PLACEMENT (IOC) RIGHT (Right Eye)  Patient location during evaluation: PACU Anesthesia Type: MAC Level of consciousness: awake and alert Pain management: pain level controlled Vital Signs Assessment: post-procedure vital signs reviewed and stable Respiratory status: spontaneous breathing Cardiovascular status: blood pressure returned to baseline Postop Assessment: no apparent nausea or vomiting, adequate PO intake and no headache Anesthetic complications: no    Adele Barthel Nai Borromeo

## 2018-11-29 NOTE — Transfer of Care (Signed)
Immediate Anesthesia Transfer of Care Note  Patient: Kelsey Diaz  Procedure(s) Performed: CATARACT EXTRACTION PHACO AND INTRAOCULAR LENS PLACEMENT (IOC) RIGHT (Right Eye)  Patient Location: PACU  Anesthesia Type: MAC  Level of Consciousness: awake, alert  and patient cooperative  Airway and Oxygen Therapy: Patient Spontanous Breathing and Patient connected to supplemental oxygen  Post-op Assessment: Post-op Vital signs reviewed, Patient's Cardiovascular Status Stable, Respiratory Function Stable, Patent Airway and No signs of Nausea or vomiting  Post-op Vital Signs: Reviewed and stable  Complications: No apparent anesthesia complications

## 2018-11-29 NOTE — Anesthesia Preprocedure Evaluation (Signed)
Anesthesia Evaluation  Patient identified by MRN, date of birth, ID band Patient awake    History of Anesthesia Complications Negative for: history of anesthetic complications  Airway Mallampati: II  TM Distance: >3 FB Neck ROM: Full    Dental  (+) Edentulous Upper, Edentulous Lower   Pulmonary asthma , COPD,  COPD inhaler, former smoker,    Pulmonary exam normal        Cardiovascular hypertension, + Past MI (many years ago, no stent/surgery)  Normal cardiovascular exam     Neuro/Psych  Neuromuscular disease (fibromyalgia)    GI/Hepatic GERD  Medicated,  Endo/Other    Renal/GU      Musculoskeletal  (+) Arthritis , Rheumatoid disorders,    Abdominal   Peds  Hematology   Anesthesia Other Findings   Reproductive/Obstetrics                             Anesthesia Physical Anesthesia Plan  ASA: III  Anesthesia Plan: MAC   Post-op Pain Management:    Induction: Intravenous  PONV Risk Score and Plan: 2 and Midazolam  Airway Management Planned: Natural Airway and Nasal Cannula  Additional Equipment:   Intra-op Plan:   Post-operative Plan:   Informed Consent: I have reviewed the patients History and Physical, chart, labs and discussed the procedure including the risks, benefits and alternatives for the proposed anesthesia with the patient or authorized representative who has indicated his/her understanding and acceptance.       Plan Discussed with:   Anesthesia Plan Comments:         Anesthesia Quick Evaluation

## 2018-11-29 NOTE — Op Note (Addendum)
All labs reviewed. Abnormal studies sent to patients PCP when indicated.  Previous H&P reviewed, patient examined, there are NO CHANGES.  Kelsey Omura Porfilio10/6/20209:40 AM  PREOPERATIVE DIAGNOSIS:  Nuclear sclerotic cataract of the right eye.   POSTOPERATIVE DIAGNOSIS:  Cataract   OPERATIVE PROCEDURE:@   SURGEON:  Birder Robson, MD.   ANESTHESIA:  Anesthesiologist: Page, Adele Barthel, MD CRNA: Georga Bora, CRNA  1.      Managed anesthesia care. 2.      0.71ml of Shugarcaine was instilled in the eye following the paracentesis.   COMPLICATIONS:  None.   TECHNIQUE:   Stop and chop   DESCRIPTION OF PROCEDURE:  The patient was examined and consented in the preoperative holding area where the aforementioned topical anesthesia was applied to the right eye and then brought back to the Operating Room where the right eye was prepped and draped in the usual sterile ophthalmic fashion and a lid speculum was placed. A paracentesis was created with the side port blade and the anterior chamber was filled with viscoelastic. A near clear corneal incision was performed with the steel keratome. A continuous curvilinear capsulorrhexis was performed with a cystotome followed by the capsulorrhexis forceps. Hydrodissection and hydrodelineation were carried out with BSS on a blunt cannula. The lens was removed in a stop and chop  technique and the remaining cortical material was removed with the irrigation-aspiration handpiece. The capsular bag was inflated with viscoelastic and the Technis [id number]  lens was placed in the capsular bag without complication. The remaining viscoelastic was removed from the eye with the irrigation-aspiration handpiece. The wounds were hydrated. The anterior chamber was flushed with BSS and the eye was inflated to physiologic pressure. 0.91ml of Vigamox was placed in the anterior chamber. The wounds were found to be water tight. The eye was dressed with Combigan. The patient was given  protective glasses to wear throughout the day and a shield with which to sleep tonight. The patient was also given drops with which to begin a drop regimen today and will follow-up with me in one day. Implant Name Type Inv. Item Serial No. Manufacturer Lot No. LRB No. Used Action  LENS IOL DIOP 20.0 - [id number] Intraocular Lens LENS IOL DIOP 20.0 [id number]  Right 1 Implanted   Procedure(s) with comments: CATARACT EXTRACTION PHACO AND INTRAOCULAR LENS PLACEMENT (IOC) RIGHT (Right) - 1:22 20.1% 16.64  Electronically signed: Birder Robson 11/29/2018 9:40 AM

## 2018-11-30 ENCOUNTER — Encounter: Payer: Self-pay | Admitting: Ophthalmology

## 2019-01-08 ENCOUNTER — Other Ambulatory Visit: Payer: Self-pay | Admitting: Anesthesiology

## 2019-01-16 ENCOUNTER — Telehealth: Payer: Self-pay | Admitting: Anesthesiology

## 2019-01-16 NOTE — Telephone Encounter (Signed)
Patient wants to come in for procedure, next available is Dec 22. She is scheduled Dec 7 for Virtual Eval, Can you ask Dr. Andree Elk if patient can be added to earlier date. He is schedule to do a procedure on Wed for 1 patient, maybe see if we can add her to that date.

## 2019-01-16 NOTE — Telephone Encounter (Signed)
Dr Andree Elk is not here today to ask him.

## 2019-01-17 NOTE — Telephone Encounter (Signed)
Dr Andree Elk,    What do you think about this. Let our secretaries know.

## 2019-01-18 ENCOUNTER — Other Ambulatory Visit: Payer: Self-pay

## 2019-01-18 ENCOUNTER — Other Ambulatory Visit: Payer: Self-pay | Admitting: Anesthesiology

## 2019-01-18 ENCOUNTER — Encounter: Payer: Self-pay | Admitting: Anesthesiology

## 2019-01-18 ENCOUNTER — Ambulatory Visit (HOSPITAL_BASED_OUTPATIENT_CLINIC_OR_DEPARTMENT_OTHER): Payer: Medicare Other | Admitting: Anesthesiology

## 2019-01-18 ENCOUNTER — Ambulatory Visit
Admission: RE | Admit: 2019-01-18 | Discharge: 2019-01-18 | Disposition: A | Discharge status: Home / Self Care | Payer: Medicare Other | Source: Ambulatory Visit | Attending: Anesthesiology | Admitting: Anesthesiology

## 2019-01-18 VITALS — BP 122/91 | HR 76 | Temp 99.3°F | Resp 13 | Ht 67.0 in | Wt 207.0 lb

## 2019-01-18 DIAGNOSIS — F1190 Opioid use, unspecified, uncomplicated: Secondary | ICD-10-CM | POA: Insufficient documentation

## 2019-01-18 DIAGNOSIS — M47817 Spondylosis without myelopathy or radiculopathy, lumbosacral region: Secondary | ICD-10-CM

## 2019-01-18 DIAGNOSIS — M0579 Rheumatoid arthritis with rheumatoid factor of multiple sites without organ or systems involvement: Secondary | ICD-10-CM

## 2019-01-18 DIAGNOSIS — R52 Pain, unspecified: Secondary | ICD-10-CM

## 2019-01-18 DIAGNOSIS — M5136 Other intervertebral disc degeneration, lumbar region: Secondary | ICD-10-CM | POA: Insufficient documentation

## 2019-01-18 DIAGNOSIS — M5431 Sciatica, right side: Secondary | ICD-10-CM | POA: Insufficient documentation

## 2019-01-18 DIAGNOSIS — M5432 Sciatica, left side: Secondary | ICD-10-CM | POA: Diagnosis present

## 2019-01-18 DIAGNOSIS — M25552 Pain in left hip: Secondary | ICD-10-CM | POA: Insufficient documentation

## 2019-01-18 DIAGNOSIS — M4716 Other spondylosis with myelopathy, lumbar region: Secondary | ICD-10-CM

## 2019-01-18 DIAGNOSIS — M25551 Pain in right hip: Secondary | ICD-10-CM | POA: Diagnosis present

## 2019-01-18 DIAGNOSIS — G894 Chronic pain syndrome: Secondary | ICD-10-CM | POA: Insufficient documentation

## 2019-01-18 MED ORDER — ROPIVACAINE HCL 2 MG/ML IJ SOLN
10.0000 mL | Freq: Once | INTRAMUSCULAR | Status: AC
  Administered 2019-01-18: 1 mL via EPIDURAL

## 2019-01-18 MED ORDER — TRIAMCINOLONE ACETONIDE 40 MG/ML IJ SUSP
40.0000 mg | Freq: Once | INTRAMUSCULAR | Status: AC
  Administered 2019-01-18: 40 mg

## 2019-01-18 MED ORDER — OXYCODONE-ACETAMINOPHEN 7.5-325 MG PO TABS: 1.0000 | ORAL_TABLET | Freq: Three times a day (TID) | ORAL | 0 refills | Status: AC | PRN

## 2019-01-18 MED ORDER — SODIUM CHLORIDE 0.9% FLUSH
10.0000 mL | Freq: Once | INTRAVENOUS | Status: AC
  Administered 2019-01-18: 10 mL

## 2019-01-18 MED ORDER — SODIUM CHLORIDE (PF) 0.9 % IJ SOLN
INTRAMUSCULAR | Status: AC
  Filled 2019-01-18: qty 10

## 2019-01-18 MED ORDER — IOHEXOL 180 MG/ML  SOLN
INTRAMUSCULAR | Status: AC
  Filled 2019-01-18: qty 20

## 2019-01-18 MED ORDER — IOPAMIDOL (ISOVUE-M 200) INJECTION 41%: 20.0000 mL | Freq: Once | INTRAMUSCULAR | Status: DC | PRN

## 2019-01-18 MED ORDER — ROPIVACAINE HCL 2 MG/ML IJ SOLN
INTRAMUSCULAR | Status: AC
  Filled 2019-01-18: qty 10

## 2019-01-18 MED ORDER — LIDOCAINE HCL (PF) 1 % IJ SOLN
5.0000 mL | Freq: Once | INTRAMUSCULAR | Status: AC
  Administered 2019-01-18: 5 mL via SUBCUTANEOUS

## 2019-01-18 MED ORDER — TRIAMCINOLONE ACETONIDE 40 MG/ML IJ SUSP
INTRAMUSCULAR | Status: AC
  Filled 2019-01-18: qty 1

## 2019-01-18 MED ORDER — LIDOCAINE HCL (PF) 1 % IJ SOLN
INTRAMUSCULAR | Status: AC
  Filled 2019-01-18: qty 10

## 2019-01-18 NOTE — Telephone Encounter (Signed)
Patient scheduled today at 12:45 per Dr. Andree Elk

## 2019-01-18 NOTE — Progress Notes (Signed)
Safety precautions to be maintained throughout the outpatient stay will include: orient to surroundings, keep bed in low position, maintain call bell within reach at all times, provide assistance with transfer out of bed and ambulation.  

## 2019-01-18 NOTE — Progress Notes (Signed)
Subjective:  Patient ID: Kelsey Diaz, female    DOB: 11/28/1945  Age: 73 y.o. MRN: [id number]  CC: Back Pain (lumbar bilateral )   Procedure L5-S1 epidural under fluoroscopic guidance without sedation  HPI Kelsey Diaz presents for reevaluation.  She was last seen via virtual visit approximately a month and half ago and had a previous epidural back in 2019.  She reported good relief with that at approximately 75 to 80% lasting many months.  She has been more active over the last several weeks and has had intensifying low back pain radiating into the bilateral hips and buttocks into the posterior legs.  No problem with numbness or tingling in the feet or calves.  Her strength is been well preserved and bowel and bladder function been well preserved.  She takes her medicines for pain control but these have been less effective as of recent and in the past she has had epidural steroids for similar pain that have been effective in reducing the severity and making the medications work better.  Furthermore she is had more problems with sleep and spasming in the back.  She desires to proceed with a repeat epidural injection today.  Outpatient Medications Prior to Visit  Medication Sig Dispense Refill  . albuterol (PROAIR HFA) 108 (90 BASE) MCG/ACT inhaler Inhale 2 puffs into the lungs every 6 (six) hours as needed for shortness of breath.     . Calcium Carbonate-Vitamin D (CALCIUM + D) 600-200 MG-UNIT TABS Take 1 tablet by mouth daily.     . clobetasol cream (TEMOVATE) [id number] % Apply 1 application topically 2 (two) times daily as needed (irritation).     . DULoxetine (CYMBALTA) 60 MG capsule Take 90 mg by mouth daily.     . fluocinonide (LIDEX) 0.05 % external solution Apply 1 application topically 2 (two) times daily as needed (skin irritation).     . folic acid (FOLVITE) 1 MG tablet Take 1 mg by mouth daily.    . Glucosamine-Chondroitin (MOVE FREE PO) Take by mouth daily.    . meloxicam  (MOBIC) 15 MG tablet Take 1 tablet (15 mg total) by mouth daily.    . methocarbamol (ROBAXIN) 500 MG tablet Take 1 tablet (500 mg total) by mouth 4 (four) times daily. 120 tablet 2  . methotrexate (RHEUMATREX) 2.5 MG tablet Take 10 mg by mouth once a week. Caution:Chemotherapy. Protect from light.    . Multiple Vitamin (MULTI-VITAMIN PO) Take 1 tablet by mouth daily.     . Omega-3 Fatty Acids (FISH OIL PO) Take 1,000 mg by mouth daily.     Marland Kitchen omeprazole (PRILOSEC) 40 MG capsule Take 1 capsule by mouth daily.  1  . predniSONE (DELTASONE) 10 MG tablet Take 10 mg by mouth daily with breakfast.    . Probiotic Product (PROBIOTIC DAILY PO) Take by mouth.    . valsartan-hydrochlorothiazide (DIOVAN-HCT) 160-12.5 MG tablet Take 0.5 tablets by mouth daily.     Marland Kitchen oxyCODONE-acetaminophen (PERCOCET) 7.5-325 MG tablet Take 1 tablet by mouth every 8 (eight) hours as needed for moderate pain or severe pain. 100 tablet 0   No facility-administered medications prior to visit.     Review of Systems CNS: No confusion or sedation Cardiac: No angina or palpitations GI: No abdominal pain or constipation Constitutional: No nausea vomiting fevers or chills  Objective:  BP 131/83   Pulse 76   Temp 99.3 F (37.4 C) (Oral)   Resp 10   Ht 5\' 7"  (1.702  m)   Wt 207 lb (93.9 kg)   SpO2 93%   BMI 32.42 kg/m    BP Readings from Last 3 Encounters:  01/18/19 131/83  11/29/18 110/71  09/27/18 118/74     Wt Readings from Last 3 Encounters:  01/18/19 207 lb (93.9 kg)  11/29/18 207 lb (93.9 kg)  09/27/18 202 lb (91.6 kg)     Physical Exam Pt is alert and oriented PERRL EOMI HEART IS RRR no murmur or rub LCTA no wheezing or rales MUSCULOSKELETAL reveals some paraspinous muscle tenderness in the lumbar region but no overt trigger points.  She walks with a mildly antalgic gait and her lower extremity muscle tone and bulk is at baseline.  Labs  No results found for: HGBA1C Lab Results  Component Value  Date   CREATININE 0.72 09/13/2017    -------------------------------------------------------------------------------------------------------------------- Lab Results  Component Value Date   WBC 9.8 09/08/2017   HGB 11.8 (L) 09/08/2017   HCT 35.3 (L) 09/08/2017   PLT 189 09/08/2017   GLUCOSE 98 09/13/2017   ALT 20 06/03/2017   AST 22 06/03/2017   NA 138 09/13/2017   K 3.4 (L) 09/13/2017   CL 100 09/13/2017   CREATININE 0.72 09/13/2017   BUN 11 09/13/2017   CO2 30 09/13/2017   TSH 1.260 06/03/2017   INR 1.0 06/03/2017    --------------------------------------------------------------------------------------------------------------------- No results found.   Assessment & Plan:   Kelsey Diaz was seen today for back pain.  Diagnoses and all orders for this visit:  DDD (degenerative disc disease), lumbar -     Lumbar Epidural Injection; Future  Bilateral sciatica -     Lumbar Epidural Injection; Future  Lumbar spondylosis with myelopathy -     Lumbar Epidural Injection; Future  Rheumatoid arthritis involving multiple sites with positive rheumatoid factor (HCC)  Chronic pain syndrome  Chronic, continuous use of opioids  Bilateral hip pain  Facet arthritis of lumbosacral region  Other orders -     oxyCODONE-acetaminophen (PERCOCET) 7.5-325 MG tablet; Take 1 tablet by mouth every 8 (eight) hours as needed for moderate pain or severe pain. -     triamcinolone acetonide (KENALOG-40) injection 40 mg -     sodium chloride flush (NS) 0.9 % injection 10 mL -     ropivacaine (PF) 2 mg/mL (0.2%) (NAROPIN) injection 10 mL -     lidocaine (PF) (XYLOCAINE) 1 % injection 5 mL -     iopamidol (ISOVUE-M) 41 % intrathecal injection 20 mL        ----------------------------------------------------------------------------------------------------------------------  Problem List Items Addressed This Visit      Unprioritized   Bilateral hip pain   Rheumatoid arthritis ([place])    Relevant Medications   oxyCODONE-acetaminophen (PERCOCET) 7.5-325 MG tablet (Start on 01/31/2019)    Other Visit Diagnoses    DDD (degenerative disc disease), lumbar    -  Primary   Relevant Medications   oxyCODONE-acetaminophen (PERCOCET) 7.5-325 MG tablet (Start on 01/31/2019)   triamcinolone acetonide (KENALOG-40) injection 40 mg (Completed)   Other Relevant Orders   Lumbar Epidural Injection   Bilateral sciatica       Relevant Orders   Lumbar Epidural Injection   Lumbar spondylosis with myelopathy       Relevant Medications   oxyCODONE-acetaminophen (PERCOCET) 7.5-325 MG tablet (Start on 01/31/2019)   triamcinolone acetonide (KENALOG-40) injection 40 mg (Completed)   Other Relevant Orders   Lumbar Epidural Injection   Chronic pain syndrome       Relevant Medications  oxyCODONE-acetaminophen (PERCOCET) 7.5-325 MG tablet (Start on 01/31/2019)   triamcinolone acetonide (KENALOG-40) injection 40 mg (Completed)   ropivacaine (PF) 2 mg/mL (0.2%) (NAROPIN) injection 10 mL (Completed)   lidocaine (PF) (XYLOCAINE) 1 % injection 5 mL (Completed)   Chronic, continuous use of opioids       Facet arthritis of lumbosacral region       Relevant Medications   oxyCODONE-acetaminophen (PERCOCET) 7.5-325 MG tablet (Start on 01/31/2019)   triamcinolone acetonide (KENALOG-40) injection 40 mg (Completed)        ----------------------------------------------------------------------------------------------------------------------  1. DDD (degenerative disc disease), lumbar As reviewed with her today I think it would be reasonable to proceed with a repeat epidural injection.  She is done well with these in the past and have kept her pain under good control.  Unfortunately she has failed more conservative therapy.  She is done her physical therapy exercises with minimal relief and her medications are currently less effective for her.  We have gone over the risks and benefits of the procedure with her in  full detail and all questions have been answered. - Lumbar Epidural Injection; Future  2. Bilateral sciatica As above and continue with efforts at weight loss and physical therapy stretching strengthening exercises.  We will have her return to clinic in 1 month for reevaluation possible repeat injection at that time. - Lumbar Epidural Injection; Future  3. Lumbar spondylosis with myelopathy As above - Lumbar Epidural Injection; Future  4. Rheumatoid arthritis involving multiple sites with positive rheumatoid factor ([place]) As above and continue follow-up with her primary care physicians  5. Chronic pain syndrome I have reviewed the [facility] practitioner database information and it is appropriate.  We will refill her medications for the opioids December of this year.  6. Chronic, continuous use of opioids As above  7. Bilateral hip pain   8. Facet arthritis of lumbosacral region     ----------------------------------------------------------------------------------------------------------------------  I am having Kelsey Diaz. Kelsey Diaz maintain her Calcium Carbonate-Vitamin D, Multiple Vitamin (MULTI-VITAMIN PO), DULoxetine, albuterol, Omega-3 Fatty Acids (FISH OIL PO), valsartan-hydrochlorothiazide, clobetasol cream, fluocinonide, omeprazole, meloxicam, methocarbamol, folic acid, predniSONE, Glucosamine-Chondroitin (MOVE FREE PO), Probiotic Product (PROBIOTIC DAILY PO), methotrexate, and oxyCODONE-acetaminophen. We administered triamcinolone acetonide, sodium chloride flush, ropivacaine (PF) 2 mg/mL (0.2%), and lidocaine (PF).   Meds ordered this encounter  Medications  . oxyCODONE-acetaminophen (PERCOCET) 7.5-325 MG tablet    Sig: Take 1 tablet by mouth every 8 (eight) hours as needed for moderate pain or severe pain.    Dispense:  100 tablet    Refill:  0  . triamcinolone acetonide (KENALOG-40) injection 40 mg  . sodium chloride flush (NS) 0.9 % injection 10 mL  . ropivacaine  (PF) 2 mg/mL (0.2%) (NAROPIN) injection 10 mL  . lidocaine (PF) (XYLOCAINE) 1 % injection 5 mL  . iopamidol (ISOVUE-M) 41 % intrathecal injection 20 mL   Patient's Medications  New Prescriptions   No medications on file  Previous Medications   ALBUTEROL (PROAIR HFA) 108 (90 BASE) MCG/ACT INHALER    Inhale 2 puffs into the lungs every 6 (six) hours as needed for shortness of breath.    CALCIUM CARBONATE-VITAMIN D (CALCIUM + D) 600-200 MG-UNIT TABS    Take 1 tablet by mouth daily.    CLOBETASOL CREAM (TEMOVATE) 0.05 %    Apply 1 application topically 2 (two) times daily as needed (irritation).    DULOXETINE (CYMBALTA) 60 MG CAPSULE    Take 90 mg by mouth daily.    FLUOCINONIDE ([place])  0.05 % EXTERNAL SOLUTION    Apply 1 application topically 2 (two) times daily as needed (skin irritation).    FOLIC ACID (FOLVITE) 1 MG TABLET    Take 1 mg by mouth daily.   GLUCOSAMINE-CHONDROITIN (MOVE FREE PO)    Take by mouth daily.   MELOXICAM (MOBIC) 15 MG TABLET    Take 1 tablet (15 mg total) by mouth daily.   METHOCARBAMOL (ROBAXIN) 500 MG TABLET    Take 1 tablet (500 mg total) by mouth 4 (four) times daily.   METHOTREXATE (RHEUMATREX) 2.5 MG TABLET    Take 10 mg by mouth once a week. Caution:Chemotherapy. Protect from light.   MULTIPLE VITAMIN (MULTI-VITAMIN PO)    Take 1 tablet by mouth daily.    OMEGA-3 FATTY ACIDS (FISH OIL PO)    Take 1,000 mg by mouth daily.    OMEPRAZOLE (PRILOSEC) 40 MG CAPSULE    Take 1 capsule by mouth daily.   PREDNISONE (DELTASONE) 10 MG TABLET    Take 10 mg by mouth daily with breakfast.   PROBIOTIC PRODUCT (PROBIOTIC DAILY PO)    Take by mouth.   VALSARTAN-HYDROCHLOROTHIAZIDE (DIOVAN-HCT) 160-12.5 MG TABLET    Take 0.5 tablets by mouth daily.   Modified Medications   Modified Medication Previous Medication   OXYCODONE-ACETAMINOPHEN (PERCOCET) 7.5-325 MG TABLET oxyCODONE-acetaminophen (PERCOCET) 7.5-325 MG tablet      Take 1 tablet by mouth every 8 (eight) hours as needed  for moderate pain or severe pain.    Take 1 tablet by mouth every 8 (eight) hours as needed for moderate pain or severe pain.  Discontinued Medications   No medications on file   ----------------------------------------------------------------------------------------------------------------------  Follow-up: Return in about 1 month (around 02/17/2019) for evaluation, procedure.   Procedure: L5-S1 LESI with fluoroscopic guidance and no moderate sedation  NOTE: The risks, benefits, and expectations of the procedure have been discussed and explained to the patient who was understanding and in agreement with suggested treatment plan. No guarantees were made.  DESCRIPTION OF PROCEDURE: Lumbar epidural steroid injection with no IV Versed, EKG, blood pressure, pulse, and pulse oximetry monitoring. The procedure was performed with the patient in the prone position under fluoroscopic guidance.  Sterile prep x3 was initiated and I then injected subcutaneous lidocaine to the overlying L5-S1 site after its fluoroscopic identifictation.  Using strict aseptic technique, I then advanced an 18-gauge Tuohy epidural needle in the midline using interlaminar approach via loss-of-resistance to saline technique. There was negative aspiration for heme or  CSF.  I then confirmed position with both AP and Lateral fluoroscan.  2 cc of contrast dye were injected and a  total of 5 mL of Preservative-Free normal saline mixed with 40 mg of Kenalog and 1cc Ropicaine 0.2 percent were injected incrementally via the  epidurally placed needle. The needle was removed. The patient tolerated the injection well and was convalesced and discharged to home in stable condition. Should the patient have any post procedure difficulty they have been instructed on how to contact us for assistance.    Molli Barrows, MD

## 2019-01-23 ENCOUNTER — Telehealth: Payer: Self-pay | Admitting: *Deleted

## 2019-01-23 NOTE — Telephone Encounter (Signed)
Pt had LESI on 01/18/2019 and states she is having a lot of pain. Instructed the patient that it could take up to 10 days for the steroid to start working. Her pain is 9/10 lower back radiating to left hip. Encourage patient to use heat and to stretch. Instructed patient that she should not take any addt. Tylenol because it was in her pain medication and she states she will take some Ibuprophen. Patient with understanding. Dr Andree Elk not in office today, will send message and  will notify him tomorrow.

## 2019-01-24 NOTE — Telephone Encounter (Signed)
Pt with severe left lower back spasm despite recent epidural.  Bowel and bladder function ok and strength ok.  No fevers or chills.  I have requested pt come in for eval and possible trigger point injection in am.  Greggory Brandy

## 2019-01-24 NOTE — Telephone Encounter (Signed)
What are your thoughts?

## 2019-01-25 NOTE — Telephone Encounter (Signed)
Can you see if this person is scheduled? This was sent after we left.

## 2019-01-26 NOTE — Telephone Encounter (Signed)
I was out of office yesterday.

## 2019-01-26 NOTE — Telephone Encounter (Signed)
I called patient, she had spoken with Dr. Andree Elk and he told her to take a muscle relaxer after pain meds. She states this has helped. She does not want to come in at this time.

## 2019-01-30 ENCOUNTER — Encounter: Payer: Medicare Other | Admitting: Anesthesiology

## 2019-03-01 ENCOUNTER — Encounter: Payer: Self-pay | Admitting: Anesthesiology

## 2019-03-01 ENCOUNTER — Other Ambulatory Visit: Payer: Self-pay

## 2019-03-01 ENCOUNTER — Ambulatory Visit: Payer: Medicare Other | Attending: Anesthesiology | Admitting: Anesthesiology

## 2019-03-01 DIAGNOSIS — M79641 Pain in right hand: Secondary | ICD-10-CM

## 2019-03-01 DIAGNOSIS — M5432 Sciatica, left side: Secondary | ICD-10-CM

## 2019-03-01 DIAGNOSIS — M4716 Other spondylosis with myelopathy, lumbar region: Secondary | ICD-10-CM | POA: Diagnosis not present

## 2019-03-01 DIAGNOSIS — G894 Chronic pain syndrome: Secondary | ICD-10-CM

## 2019-03-01 DIAGNOSIS — M5431 Sciatica, right side: Secondary | ICD-10-CM

## 2019-03-01 DIAGNOSIS — M79642 Pain in left hand: Secondary | ICD-10-CM

## 2019-03-01 DIAGNOSIS — M25552 Pain in left hip: Secondary | ICD-10-CM

## 2019-03-01 DIAGNOSIS — M0579 Rheumatoid arthritis with rheumatoid factor of multiple sites without organ or systems involvement: Secondary | ICD-10-CM

## 2019-03-01 DIAGNOSIS — F1190 Opioid use, unspecified, uncomplicated: Secondary | ICD-10-CM

## 2019-03-01 DIAGNOSIS — M5136 Other intervertebral disc degeneration, lumbar region: Secondary | ICD-10-CM | POA: Diagnosis not present

## 2019-03-01 DIAGNOSIS — M47817 Spondylosis without myelopathy or radiculopathy, lumbosacral region: Secondary | ICD-10-CM

## 2019-03-01 DIAGNOSIS — M25551 Pain in right hip: Secondary | ICD-10-CM

## 2019-03-01 MED ORDER — OXYCODONE-ACETAMINOPHEN 5-325 MG PO TABS: 1.0000 | ORAL_TABLET | Freq: Three times a day (TID) | ORAL | 0 refills | Status: AC | PRN

## 2019-03-01 NOTE — Progress Notes (Signed)
Virtual Visit via Telephone Note  I connected with Kelsey Diaz on 03/01/19 at  1:15 PM EST by telephone and verified that I am speaking with the correct person using two identifiers.  Location: Patient: Home Provider: Pain control center   I discussed the limitations, risks, security and privacy concerns of performing an evaluation and management service by telephone and the availability of in person appointments. I also discussed with the patient that there may be a patient responsible charge related to this service. The patient expressed understanding and agreed to proceed.   History of Present Illness: I spoke with Kelsey Diaz via telephone for her virtual conference.  She was unable to do the video portion of the interview.  She states that she has been doing reasonably well with her low back pain but has had some recent problems with some dizziness especially going from seated to standing.  She has had some slight graying of her vision with this and does have a past medical history consistent with dizziness.  We have gone over her medications via the chart review.  She is taking Percocet 7.5 mg 3 times a day and has discontinued her methocarbamol.  The relief she gets with her medications is good and it is helping with her pain control but she is concerned that the dose may be causing some of the dizziness.  She did check her heart rate and was 55 she reports.  Otherwise the quality characteristic and distribution of the pain to been stable with no changes noted.    Observations/Objective:  Current Outpatient Medications:  .  albuterol (PROAIR HFA) 108 (90 BASE) MCG/ACT inhaler, Inhale 2 puffs into the lungs every 6 (six) hours as needed for shortness of breath. , Disp: , Rfl:  .  Calcium Carbonate-Vitamin D (CALCIUM + D) 600-200 MG-UNIT TABS, Take 1 tablet by mouth daily. , Disp: , Rfl:  .  clobetasol cream (TEMOVATE) AB-123456789 %, Apply 1 application topically 2 (two) times daily as needed  (irritation). , Disp: , Rfl:  .  DULoxetine (CYMBALTA) 60 MG capsule, Take 90 mg by mouth daily. , Disp: , Rfl:  .  fluocinonide (LIDEX) 0.05 % external solution, Apply 1 application topically 2 (two) times daily as needed (skin irritation). , Disp: , Rfl:  .  folic acid (FOLVITE) 1 MG tablet, Take 1 mg by mouth daily., Disp: , Rfl:  .  Glucosamine-Chondroitin (MOVE FREE PO), Take by mouth daily., Disp: , Rfl:  .  meloxicam (MOBIC) 15 MG tablet, Take 1 tablet (15 mg total) by mouth daily., Disp: , Rfl:  .  methocarbamol (ROBAXIN) 500 MG tablet, Take 1 tablet (500 mg total) by mouth 4 (four) times daily., Disp: 120 tablet, Rfl: 2 .  methotrexate (RHEUMATREX) 2.5 MG tablet, Take 10 mg by mouth once a week. Caution:Chemotherapy. Protect from light., Disp: , Rfl:  .  Multiple Vitamin (MULTI-VITAMIN PO), Take 1 tablet by mouth daily. , Disp: , Rfl:  .  Omega-3 Fatty Acids (FISH OIL PO), Take 1,000 mg by mouth daily. , Disp: , Rfl:  .  omeprazole (PRILOSEC) 40 MG capsule, Take 1 capsule by mouth daily., Disp: , Rfl: 1 .  oxyCODONE-acetaminophen (PERCOCET) 5-325 MG tablet, Take 1 tablet by mouth every 8 (eight) hours as needed for moderate pain or severe pain., Disp: 90 tablet, Rfl: 0 .  oxyCODONE-acetaminophen (PERCOCET) 7.5-325 MG tablet, Take 1 tablet by mouth every 8 (eight) hours as needed for moderate pain or severe pain., Disp: 100  tablet, Rfl: 0 .  predniSONE (DELTASONE) 10 MG tablet, Take 10 mg by mouth daily with breakfast., Disp: , Rfl:  .  Probiotic Product (PROBIOTIC DAILY PO), Take by mouth., Disp: , Rfl:  .  valsartan-hydrochlorothiazide (DIOVAN-HCT) 160-12.5 MG tablet, Take 0.5 tablets by mouth daily. , Disp: , Rfl:   Assessment and Plan:  1. DDD (degenerative disc disease), lumbar   2. Bilateral sciatica   3. Lumbar spondylosis with myelopathy   4. Rheumatoid arthritis involving multiple sites with positive rheumatoid factor ([place])   5. Chronic pain syndrome   6. Chronic, continuous  use of opioids   7. Bilateral hip pain   8. Facet arthritis of lumbosacral region   9. Pain in both hands   Based on our discussion today and upon review of the [facility] practitioner database information going to refill her medications but reduce her to a Percocet 5 mg tablets that she can take 2 or 3 times a day.  I want her to discontinue the Robaxin for now as well and I have asked that she see her primary care physician for baseline evaluation.  Based on the heart rate she may need further diagnostic evaluation.  Her refill will be dated for today.  I will schedule her for a 1 month return to the pain control center as well.  She is instructed to contact us should she have any more problems with pain management Follow Up Instructions:    I discussed the assessment and treatment plan with the patient. The patient was provided an opportunity to ask questions and all were answered. The patient agreed with the plan and demonstrated an understanding of the instructions.   The patient was advised to call back or seek an in-person evaluation if the symptoms worsen or if the condition fails to improve as anticipated.  I provided 30 minutes of non-face-to-face time during this encounter.   Molli Barrows, MD

## 2019-03-06 ENCOUNTER — Telehealth: Payer: Self-pay | Admitting: *Deleted

## 2019-03-06 NOTE — Telephone Encounter (Signed)
I put her on for Wednesday and she is crying wanting to know if there is an emergency number you can call Dr. Andree Elk about this. She said she cant wait till Wednesday, she only has 2 hydrocodone and will go into withdrawals by then. I told all I could do was send a note back.

## 2019-03-06 NOTE — Telephone Encounter (Signed)
She stopped the Oxycodone on her own because it caused dizziness, causing her to "almost pass out." She has been taking Hydrocodone from an older prescription. I told her that per our Medication Agreement, no medication changes are made outside of an appointment. I transferred the phone to a secretary to schedule a VV. I also sent a bubble message to Dr. Andree Elk, asking if he will change her meds without an appointment.

## 2019-03-07 MED ORDER — HYDROCODONE-ACETAMINOPHEN 7.5-325 MG PO TABS: 1.0000 | ORAL_TABLET | Freq: Four times a day (QID) | ORAL | 0 refills | Status: AC | PRN

## 2019-03-07 NOTE — Addendum Note (Signed)
Addended by: Molli Barrows on: 03/07/2019 10:38 AM   Modules accepted: Orders

## 2019-03-08 ENCOUNTER — Ambulatory Visit: Payer: Medicare Other | Attending: Anesthesiology | Admitting: Anesthesiology

## 2019-03-08 ENCOUNTER — Other Ambulatory Visit: Payer: Self-pay

## 2019-03-08 ENCOUNTER — Encounter: Payer: Self-pay | Admitting: Anesthesiology

## 2019-03-08 DIAGNOSIS — M5136 Other intervertebral disc degeneration, lumbar region: Secondary | ICD-10-CM

## 2019-03-08 DIAGNOSIS — G894 Chronic pain syndrome: Secondary | ICD-10-CM

## 2019-03-08 DIAGNOSIS — M5106 Intervertebral disc disorders with myelopathy, lumbar region: Secondary | ICD-10-CM | POA: Diagnosis not present

## 2019-03-08 DIAGNOSIS — Z79891 Long term (current) use of opiate analgesic: Secondary | ICD-10-CM

## 2019-03-08 DIAGNOSIS — M5432 Sciatica, left side: Secondary | ICD-10-CM | POA: Diagnosis not present

## 2019-03-08 DIAGNOSIS — M0579 Rheumatoid arthritis with rheumatoid factor of multiple sites without organ or systems involvement: Secondary | ICD-10-CM

## 2019-03-08 DIAGNOSIS — M4716 Other spondylosis with myelopathy, lumbar region: Secondary | ICD-10-CM

## 2019-03-08 DIAGNOSIS — F1190 Opioid use, unspecified, uncomplicated: Secondary | ICD-10-CM

## 2019-03-08 DIAGNOSIS — M5431 Sciatica, right side: Secondary | ICD-10-CM

## 2019-03-08 DIAGNOSIS — M25552 Pain in left hip: Secondary | ICD-10-CM

## 2019-03-08 DIAGNOSIS — M25551 Pain in right hip: Secondary | ICD-10-CM

## 2019-03-08 DIAGNOSIS — M47817 Spondylosis without myelopathy or radiculopathy, lumbosacral region: Secondary | ICD-10-CM

## 2019-03-08 MED ORDER — HYDROCODONE-ACETAMINOPHEN 7.5-325 MG PO TABS: 1.0000 | ORAL_TABLET | Freq: Four times a day (QID) | ORAL | 0 refills | Status: AC | PRN

## 2019-03-08 MED ORDER — MELOXICAM 15 MG PO TABS: 15.0000 mg | ORAL_TABLET | Freq: Every day | ORAL | 3 refills | Status: AC

## 2019-03-09 NOTE — Progress Notes (Signed)
Virtual Visit via Telephone Note  I connected with Kelsey Diaz on 03/09/19 at  3:15 PM EST by telephone and verified that I am speaking with the correct person using two identifiers.  Location: Patient: Home Provider: Pain control center   I discussed the limitations, risks, security and privacy concerns of performing an evaluation and management service by telephone and the availability of in person appointments. I also discussed with the patient that there may be a patient responsible charge related to this service. The patient expressed understanding and agreed to proceed.   History of Present Illness: I spoke with Kelsey Diaz via telephone for her virtual conference.  She was unable to do the video portion of the conference.  She states that she is doing reasonably well with her low back pain and it has been stable in nature.  No significant changes are noted.  She is taking her medications as prescribed and these continue to work well for her.  She is getting good relief from these with no side effects reported.  Otherwise she is in her usual state of health and the quality characteristic and distribution of her low back pain hip pain and lower extremity pain is been stable in nature.    Observations/Objective:  Current Outpatient Medications:  .  albuterol (PROAIR HFA) 108 (90 BASE) MCG/ACT inhaler, Inhale 2 puffs into the lungs every 6 (six) hours as needed for shortness of breath. , Disp: , Rfl:  .  Calcium Carbonate-Vitamin D (CALCIUM + D) 600-200 MG-UNIT TABS, Take 1 tablet by mouth daily. , Disp: , Rfl:  .  clobetasol cream (TEMOVATE) AB-123456789 %, Apply 1 application topically 2 (two) times daily as needed (irritation). , Disp: , Rfl:  .  DULoxetine (CYMBALTA) 60 MG capsule, Take 90 mg by mouth daily. , Disp: , Rfl:  .  fluocinonide (LIDEX) 0.05 % external solution, Apply 1 application topically 2 (two) times daily as needed (skin irritation). , Disp: , Rfl:  .  folic acid (FOLVITE)  1 MG tablet, Take 1 mg by mouth daily., Disp: , Rfl:  .  Glucosamine-Chondroitin (MOVE FREE PO), Take by mouth daily., Disp: , Rfl:  .  HYDROcodone-acetaminophen (NORCO) 7.5-325 MG tablet, Take 1 tablet by mouth every 6 (six) hours as needed for moderate pain or severe pain., Disp: 90 tablet, Rfl: 0 .  [START ON 04/06/2019] HYDROcodone-acetaminophen (NORCO) 7.5-325 MG tablet, Take 1 tablet by mouth every 6 (six) hours as needed for moderate pain or severe pain., Disp: 100 tablet, Rfl: 0 .  meloxicam (MOBIC) 15 MG tablet, Take 1 tablet (15 mg total) by mouth daily., Disp: 30 tablet, Rfl: 3 .  methocarbamol (ROBAXIN) 500 MG tablet, Take 1 tablet (500 mg total) by mouth 4 (four) times daily., Disp: 120 tablet, Rfl: 2 .  methotrexate (RHEUMATREX) 2.5 MG tablet, Take 10 mg by mouth once a week. Caution:Chemotherapy. Protect from light., Disp: , Rfl:  .  Multiple Vitamin (MULTI-VITAMIN PO), Take 1 tablet by mouth daily. , Disp: , Rfl:  .  Omega-3 Fatty Acids (FISH OIL PO), Take 1,000 mg by mouth daily. , Disp: , Rfl:  .  omeprazole (PRILOSEC) 40 MG capsule, Take 1 capsule by mouth daily., Disp: , Rfl: 1 .  oxyCODONE-acetaminophen (PERCOCET) 5-325 MG tablet, Take 1 tablet by mouth every 8 (eight) hours as needed for moderate pain or severe pain., Disp: 90 tablet, Rfl: 0 .  predniSONE (DELTASONE) 10 MG tablet, Take 10 mg by mouth daily with breakfast., Disp: ,  Rfl:  .  Probiotic Product (PROBIOTIC DAILY PO), Take by mouth., Disp: , Rfl:  .  valsartan-hydrochlorothiazide (DIOVAN-HCT) 160-12.5 MG tablet, Take 0.5 tablets by mouth daily. , Disp: , Rfl:   Assessment and Plan: 1. DDD (degenerative disc disease), lumbar   2. Bilateral sciatica   3. Lumbar spondylosis with myelopathy   4. Rheumatoid arthritis involving multiple sites with positive rheumatoid factor ([place])   5. Chronic pain syndrome   6. Chronic, continuous use of opioids   7. Bilateral hip pain   8. Facet arthritis of lumbosacral region   As  per our review today and upon review of the [facility] practitioner database information I am going to refill her medications for the next 2 months.  To be dated for January 7 and February 6.  However scheduled for return to clinic in 2 months.  She is to contact us at the pain control center should she have any problems in the meantime and continue follow-up with her primary care physicians for baseline medical care.  Follow Up Instructions:    I discussed the assessment and treatment plan with the patient. The patient was provided an opportunity to ask questions and all were answered. The patient agreed with the plan and demonstrated an understanding of the instructions.   The patient was advised to call back or seek an in-person evaluation if the symptoms worsen or if the condition fails to improve as anticipated.  I provided 30 minutes of non-face-to-face time during this encounter.   Molli Barrows, MD

## 2019-03-30 ENCOUNTER — Encounter: Payer: Medicare Other | Admitting: Anesthesiology

## 2019-05-11 ENCOUNTER — Ambulatory Visit: Payer: Medicare Other | Attending: Anesthesiology | Admitting: Anesthesiology

## 2019-05-11 ENCOUNTER — Other Ambulatory Visit: Payer: Self-pay

## 2019-05-16 ENCOUNTER — Encounter: Payer: Self-pay | Admitting: Anesthesiology

## 2019-05-16 ENCOUNTER — Ambulatory Visit: Payer: Medicare Other | Attending: Anesthesiology | Admitting: Anesthesiology

## 2019-05-16 ENCOUNTER — Telehealth: Payer: Self-pay | Admitting: Anesthesiology

## 2019-05-16 ENCOUNTER — Other Ambulatory Visit: Payer: Self-pay

## 2019-05-16 DIAGNOSIS — M5431 Sciatica, right side: Secondary | ICD-10-CM

## 2019-05-16 DIAGNOSIS — M25552 Pain in left hip: Secondary | ICD-10-CM

## 2019-05-16 DIAGNOSIS — M5136 Other intervertebral disc degeneration, lumbar region: Secondary | ICD-10-CM | POA: Diagnosis not present

## 2019-05-16 DIAGNOSIS — M4716 Other spondylosis with myelopathy, lumbar region: Secondary | ICD-10-CM | POA: Diagnosis not present

## 2019-05-16 DIAGNOSIS — M25551 Pain in right hip: Secondary | ICD-10-CM

## 2019-05-16 DIAGNOSIS — M0579 Rheumatoid arthritis with rheumatoid factor of multiple sites without organ or systems involvement: Secondary | ICD-10-CM

## 2019-05-16 DIAGNOSIS — F1190 Opioid use, unspecified, uncomplicated: Secondary | ICD-10-CM

## 2019-05-16 DIAGNOSIS — G894 Chronic pain syndrome: Secondary | ICD-10-CM

## 2019-05-16 DIAGNOSIS — M5432 Sciatica, left side: Secondary | ICD-10-CM

## 2019-05-16 DIAGNOSIS — M797 Fibromyalgia: Secondary | ICD-10-CM

## 2019-05-16 HISTORY — DX: Fibromyalgia: M79.7

## 2019-05-16 MED ORDER — OXYCODONE-ACETAMINOPHEN 7.5-325 MG PO TABS: 1.0000 | ORAL_TABLET | Freq: Four times a day (QID) | ORAL | 0 refills | Status: DC | PRN

## 2019-05-16 MED ORDER — HYDROCODONE-ACETAMINOPHEN 7.5-325 MG PO TABS: 1.0000 | ORAL_TABLET | Freq: Four times a day (QID) | ORAL | 0 refills | Status: AC | PRN

## 2019-05-16 MED ORDER — HYDROCODONE-ACETAMINOPHEN 7.5-325 MG PO TABS: 1.0000 | ORAL_TABLET | Freq: Four times a day (QID) | ORAL | 0 refills | Status: DC | PRN

## 2019-05-16 NOTE — Telephone Encounter (Signed)
Patient called, Dr. Andree Elk sent in wrong medication, she cannot take Oxy She needs Hydrocodone sent in asap. She has been out of meds for over a week. Please call Dr. Andree Elk asap so he can fix this. She was kind of irritated.

## 2019-05-16 NOTE — Progress Notes (Signed)
Virtual Visit via Telephone Note  I connected with Kelsey Diaz on 05/16/19 at  1:00 PM EDT by telephone and verified that I am speaking with the correct person using two identifiers.  Location: Patient: Home Provider: Pain control center   I discussed the limitations, risks, security and privacy concerns of performing an evaluation and management service by telephone and the availability of in person appointments. I also discussed with the patient that there may be a patient responsible charge related to this service. The patient expressed understanding and agreed to proceed.   History of Present Illness: I spoke with Kelsey Diaz today via telephone as she was unable to do the video portion of the virtual conference.  She reports that she has had an increasing amount of low back pain similar to what she has experienced in the past with occasional worsening exacerbations.  Generally speaking the opioid medications keep her pain under good control but she recently ran short of these.  Secondary to a scheduling confusion with her appointment she was able to utilize some reserve medication she has had and use these sparingly which is gotten her to today's date.  She reports no untoward side effects with this regimen.  She has done quite well with the 7.5 mg Vicodin tablets with no side effects and getting good relief generally lasting about 4 to 8 hours following her medications.  She has been averaging approximately 3 times daily to 4 times daily as needed dosing historically.  Otherwise the quality characteristic and distribution of her low back pain and lower extremity pain are of the same nature.  They also give her relief with the fibromyalgia.  No change in bowel or bladder function is noted.  Otherwise she is in her usual state of health at this time.    Observations/Objective:  Current Outpatient Medications:  .  albuterol (PROAIR HFA) 108 (90 BASE) MCG/ACT inhaler, Inhale 2 puffs into the  lungs every 6 (six) hours as needed for shortness of breath. , Disp: , Rfl:  .  Calcium Carbonate-Vitamin D (CALCIUM + D) 600-200 MG-UNIT TABS, Take 1 tablet by mouth daily. , Disp: , Rfl:  .  clobetasol cream (TEMOVATE) AB-123456789 %, Apply 1 application topically 2 (two) times daily as needed (irritation). , Disp: , Rfl:  .  DULoxetine (CYMBALTA) 60 MG capsule, Take 90 mg by mouth daily. , Disp: , Rfl:  .  fluocinonide (LIDEX) 0.05 % external solution, Apply 1 application topically 2 (two) times daily as needed (skin irritation). , Disp: , Rfl:  .  folic acid (FOLVITE) 1 MG tablet, Take 1 mg by mouth daily., Disp: , Rfl:  .  Glucosamine-Chondroitin (MOVE FREE PO), Take by mouth daily., Disp: , Rfl:  .  methocarbamol (ROBAXIN) 500 MG tablet, Take 1 tablet (500 mg total) by mouth 4 (four) times daily., Disp: 120 tablet, Rfl: 2 .  methotrexate (RHEUMATREX) 2.5 MG tablet, Take 10 mg by mouth once a week. Caution:Chemotherapy. Protect from light., Disp: , Rfl:  .  Multiple Vitamin (MULTI-VITAMIN PO), Take 1 tablet by mouth daily. , Disp: , Rfl:  .  Omega-3 Fatty Acids (FISH OIL PO), Take 1,000 mg by mouth daily. , Disp: , Rfl:  .  omeprazole (PRILOSEC) 40 MG capsule, Take 1 capsule by mouth daily., Disp: , Rfl: 1 .  oxyCODONE-acetaminophen (PERCOCET) 7.5-325 MG tablet, Take 1 tablet by mouth every 6 (six) hours as needed for moderate pain or severe pain., Disp: 100 tablet, Rfl: 0 .  [  START ON 06/15/2019] oxyCODONE-acetaminophen (PERCOCET) 7.5-325 MG tablet, Take 1 tablet by mouth every 6 (six) hours as needed for moderate pain or severe pain., Disp: 100 tablet, Rfl: 0 .  predniSONE (DELTASONE) 10 MG tablet, Take 10 mg by mouth daily with breakfast., Disp: , Rfl:  .  Probiotic Product (PROBIOTIC DAILY PO), Take by mouth., Disp: , Rfl:  .  valsartan-hydrochlorothiazide (DIOVAN-HCT) 160-12.5 MG tablet, Take 0.5 tablets by mouth daily. , Disp: , Rfl:  Assessment and Plan: 1. DDD (degenerative disc disease),  lumbar   2. Bilateral sciatica   3. Lumbar spondylosis with myelopathy   4. Rheumatoid arthritis involving multiple sites with positive rheumatoid factor ([place])   5. Chronic pain syndrome   6. Chronic, continuous use of opioids   7. Bilateral hip pain   8. Fibromyalgia   Based on our discussion today and upon review of the [facility] practitioner database information I think is appropriate to refill her medications for March 23 and April 22.  This will be for Vicodin 7.5 mg tablets.  I want her to continue with her stretching strengthening exercises.  With the current Covid crisis she mentions that she is less active and I feel that this is playing into some of the exacerbation in pain.  She does not desire to proceed with any type of interventional pain treatments at this point.  I want her to continue follow-up with her primary care physicians as well with return to clinic in 2 months. Follow Up Instructions:    I discussed the assessment and treatment plan with the patient. The patient was provided an opportunity to ask questions and all were answered. The patient agreed with the plan and demonstrated an understanding of the instructions.   The patient was advised to call back or seek an in-person evaluation if the symptoms worsen or if the condition fails to improve as anticipated.  I provided 30 minutes of non-face-to-face time during this encounter.   Molli Barrows, MD

## 2019-05-16 NOTE — Telephone Encounter (Signed)
Message has been routed to Dr Andree Elk.

## 2019-05-16 NOTE — Addendum Note (Signed)
Addended by: Molli Barrows on: 05/16/2019 02:42 PM   Modules accepted: Orders

## 2019-05-18 ENCOUNTER — Other Ambulatory Visit: Payer: Self-pay | Admitting: Family Medicine

## 2019-05-18 DIAGNOSIS — N6310 Unspecified lump in the right breast, unspecified quadrant: Secondary | ICD-10-CM

## 2019-06-07 ENCOUNTER — Ambulatory Visit
Admission: RE | Admit: 2019-06-07 | Discharge: 2019-06-07 | Disposition: A | Discharge status: Home / Self Care | Payer: Medicare Other | Source: Ambulatory Visit | Attending: Family Medicine | Admitting: Family Medicine

## 2019-06-07 ENCOUNTER — Ambulatory Visit: Payer: Medicare Other

## 2019-06-07 ENCOUNTER — Other Ambulatory Visit: Payer: Self-pay

## 2019-06-07 DIAGNOSIS — N6310 Unspecified lump in the right breast, unspecified quadrant: Secondary | ICD-10-CM

## 2019-06-19 ENCOUNTER — Other Ambulatory Visit: Payer: Self-pay

## 2019-06-19 ENCOUNTER — Encounter: Payer: Self-pay | Admitting: Pulmonary Disease

## 2019-06-19 ENCOUNTER — Ambulatory Visit: Payer: Medicare Other | Admitting: Pulmonary Disease

## 2019-06-19 DIAGNOSIS — R0602 Shortness of breath: Secondary | ICD-10-CM | POA: Diagnosis not present

## 2019-06-19 NOTE — Progress Notes (Signed)
Kelsey Diaz    [id number]    08-Nov-1945  Primary Care Physician:White, Caren Griffins, MD  Referring Physician: Harlan Stains, MD [address] [place],  [place] [address]  Chief complaint: Follow-up for COPD, asthma, sleep apnea  HPI: 74 year old with history of hypertension, COPD, fibromyalgia, GERD, hypothyroidism, sleep apnea,.  She has previously seen Dr. Halford Chessman at the pulmonary clinic in 2014.  Referred back for management of COPD, asthma She reports worsening dyspnea on exertion or the past 6 months associated with chest tightness, wheezing.  She is just on albuterol inhaler and has been intolerant ofFollow-up Advair, Pulmicort and Spiriva in the past.  She denies any nighttime awakening.  No sputum production, fevers, chills, hemoptysis.   History noted for significant GERD, hiatal hernia, esophageal strictures.  She follows with Dr. Teena Irani, GI heartburn. Reports worsening heartburn, indigestion over the past few months.  She has history of sleep apnea and was previously on CPAP.  She stopped using it many years ago as she was intolerant of the mask and does not want to try again.  Pets: No pets Occupation: Retired Secondary school teacher at [employer] Exposures: No mold, hot tubs.  No other significant exposure. Smoking history: 80-pack-year smoking history.  Quit in 2001 Travel History: Not significant.  Lived in [place] all her life.  Interim history: Was on Trelegy.  She stopped using it as there is no difference in breathing and it made her cough Is just using the albuterol every few weeks.  Diagnosed with rheumatoid arthritis with positive rheumatoid factor, small joint arthritis in summer 2020 by Dr. Dossie Der.  On methotrexate therapy.  Outpatient Encounter Medications as of 06/19/2019  Medication Sig  . albuterol (PROAIR HFA) 108 (90 BASE) MCG/ACT inhaler Inhale 2 puffs into the lungs every 6 (six) hours as needed for shortness of breath.   .  Calcium Carbonate-Vitamin D (CALCIUM + D) 600-200 MG-UNIT TABS Take 1 tablet by mouth daily.   . clobetasol cream (TEMOVATE) AB-123456789 % Apply 1 application topically 2 (two) times daily as needed (irritation).   . DULoxetine (CYMBALTA) 60 MG capsule Take 90 mg by mouth daily.   . fluocinonide (LIDEX) 0.05 % external solution Apply 1 application topically 2 (two) times daily as needed (skin irritation).   . folic acid (FOLVITE) 1 MG tablet Take 1 mg by mouth daily.  . Glucosamine-Chondroitin (MOVE FREE PO) Take by mouth daily.  Marland Kitchen HYDROcodone-acetaminophen (NORCO) 7.5-325 MG tablet Take 1 tablet by mouth every 6 (six) hours as needed for moderate pain or severe pain.  . methocarbamol (ROBAXIN) 500 MG tablet Take 1 tablet (500 mg total) by mouth 4 (four) times daily.  . methotrexate (RHEUMATREX) 2.5 MG tablet Take 10 mg by mouth once a week. Caution:Chemotherapy. Protect from light.  Taking 7 pills per day  . Multiple Vitamin (MULTI-VITAMIN PO) Take 1 tablet by mouth daily.   . Omega-3 Fatty Acids (FISH OIL PO) Take 1,000 mg by mouth daily.   Marland Kitchen omeprazole (PRILOSEC) 40 MG capsule Take 1 capsule by mouth daily.  . predniSONE (DELTASONE) 10 MG tablet Take 10 mg by mouth daily with breakfast.  . Probiotic Product (PROBIOTIC DAILY PO) Take by mouth.  . valsartan-hydrochlorothiazide (DIOVAN-HCT) 160-12.5 MG tablet Take 0.5 tablets by mouth daily. Takes 40mg  daily   No facility-administered encounter medications on file as of 06/19/2019.   Physical Exam: Blood pressure 134/72, pulse 69, temperature 97.7 F (36.5 C), temperature source  Temporal, height 5' 6.5" (1.689 m), weight 205 lb 9.6 oz (93.3 kg), SpO2 98 %. Gen:      No acute distress HEENT:  EOMI, sclera anicteric Neck:     No masses; no thyromegaly Lungs:    Clear to auscultation bilaterally; normal respiratory effort CV:         Regular rate and rhythm; no murmurs Abd:      + bowel sounds; soft, non-tender; no palpable masses, no  distension Ext:    No edema; adequate peripheral perfusion Skin:      Warm and dry; no rash Neuro: alert and oriented x 3 Psych: normal mood and affect  Data Reviewed: PFTs  10/09/11 FVC 3.51 [104%], FEV1 2.20 [89%], F/F 63, TLC 92%, DLCO 52% Mild obstructive airway disease.  Moderate decrease in diffusion capacity.  No bronchodilator response.  05/04/17 FVC 3.20 [101%], FEV1 2.23 [97%], F/F 73, TLC 108%, DLCO 53% Minimal obstructive airway disease, moderate decrease in diffusion capacity.  No bronchodilator response.  Labs CBC 03/25/17-WBC 6.9, eos 3.8%, absolute eosinophil count 300 Allergy profile 03/25/17-IgE 52, RAST panel negative Alpha-1 antitrypsin 03/25/17-193, PIMM  FENO 03/25/17-37 FENO 05/04/17-34  Imaging Chest x-ray 09/13/2017-emphysema, chronic bronchitis.  With no active pulmonary disease. CT abdomen pelvis 12/01/14- visualized lung bases are clear. I have reviewed the images personally.  Assessment:  COPD, moderate persistent asthma  PFTs reviewed. Although she does not have obstruction by F/F criteria at this curvature to the loop suggestive of minimal obstructive airway disease. Reduction in diffusion capacity noted.  There is no evidence of interstitial lung disease.  Has not tolerated multiple inhalers in the past including Advair, Breo, Trelegy. Currently on just albuterol as needed.  As per symptoms are not severe so okay to observe her off controller medication  Rheumatoid arthritis, on methotrexate This is a recent diagnosis We will get high-res CT and PFTs for baseline evaluation to make sure there is no interstitial lung disease.  GERD Follows with [facility].  Continues on PPI.  Obstructive sleep apnea Noncompliant with CPAP.  Discussed with patient and she is not interested in retrying CPAP therapy.  Health maintenance 03/16/2013-Prevnar 03/01/2017-Pneumovax  Plan/Recommendations: - Albuterol as needed - High-res CT, PFTs  Marshell Garfinkel  MD [facility] 06/19/2019, 11:14 AM  CC: Harlan Stains, MD

## 2019-06-19 NOTE — Patient Instructions (Signed)
We will schedule you for high-resolution CT and PFTs for evaluation of the lung Continue your current inhalers as prescribed Follow-up in 3 months.

## 2019-07-02 DIAGNOSIS — Z7189 Other specified counseling: Secondary | ICD-10-CM | POA: Insufficient documentation

## 2019-07-02 DIAGNOSIS — R0602 Shortness of breath: Secondary | ICD-10-CM

## 2019-07-02 HISTORY — DX: Shortness of breath: R06.02

## 2019-07-02 HISTORY — DX: Other specified counseling: Z71.89

## 2019-07-02 NOTE — Progress Notes (Signed)
Cardiology Office Note   Date:  07/03/2019   ID:  Kelsey Diaz, Kelsey Diaz 1946-01-27, MRN [id number]  PCP:  Harlan Stains, MD  Cardiologist:   No primary care provider on file. Referring:  Harlan Stains, MD  Chief Complaint  Patient presents with  . Dizziness      History of Present Illness: Kelsey Diaz is a 74 y.o. female who I saw last in 2019 for chest pain and SOB.  Cardiac cath in 2019 demonstrated minimal coronary plaque.    She is now here for new reason.  She has had difficult to control hypertension.  She reports that this has been going on for about 6 weeks.  She is seen a high reading of 200/100.  Very often in the 180s over 90s.  However, sometimes with diastolics in the [id number].  She has had lots of presyncopal episodes because of this.  She does not really think she is lost consciousness necessarily completely although she might have about 6 weeks ago.  A few weekends ago she had multiple episodes of presyncope having to slide down to the floor from a standing position although one time she was seated in her husband's rolling walker when she had to ease herself down.  She gets lightheaded but she is not really describing palpitations.  She had no trauma.  Because her blood pressure was running low she actually was taken off of her blood pressure medicines temporarily and her valsartan HCTZ was reduced to a lower dose of valsartan alone.  She has not had chest pressure, neck or arm discomfort.  She had no weight gain or edema.  There has not been any sudden change in her medications other than the blood pressure meds as listed.   Past Medical History:  Diagnosis Date  . Allergic rhinitis   . Anxiety   . Asthma   . Bilateral hip pain 11/28/2018  . Colitis   . COPD (chronic obstructive pulmonary disease) ([place])   . Fibromyalgia 05/16/2019  . GERD (gastroesophageal reflux disease)    With esophageal strictures  . Hypertension   . Hyperthyroidism   . OSA (obstructive  sleep apnea) 02/09/2012   No mask.  Did not tolerate  . Osteoarthritis   . Rheumatoid arthritis ([place]) 10/03/2018    Past Surgical History:  Procedure Laterality Date  . BREAST BIOPSY     x2  . CATARACT EXTRACTION W/PHACO Left 09/27/2018   Procedure: CATARACT EXTRACTION PHACO AND INTRAOCULAR LENS PLACEMENT ([place]) LEFT;  Surgeon: Birder Robson, MD;  Location: [facility];  Service: Ophthalmology;  Laterality: Left;  . CATARACT EXTRACTION W/PHACO Right 11/29/2018   Procedure: CATARACT EXTRACTION PHACO AND INTRAOCULAR LENS PLACEMENT ([place]) RIGHT;  Surgeon: Birder Robson, MD;  Location: [facility];  Service: Ophthalmology;  Laterality: Right;  1:22 20.1% 16.64  . CHOLECYSTECTOMY     2005  . KNEE SURGERY Right 2013   two torn ligaments repaired.   Marland Kitchen LEFT HEART CATH AND CORONARY ANGIOGRAPHY N/A 06/08/2017   Procedure: LEFT HEART CATH AND CORONARY ANGIOGRAPHY;  Surgeon: Troy Sine, MD;  Location: [facility];  Service: Cardiovascular;  Laterality: N/A;  . NASAL SINUS SURGERY    . VAGINAL HYSTERECTOMY       Current Outpatient Medications  Medication Sig Dispense Refill  . albuterol (PROAIR HFA) 108 (90 BASE) MCG/ACT inhaler Inhale 2 puffs into the lungs every 6 (six) hours as needed for shortness of breath.     . Calcium  Carbonate-Vitamin D (CALCIUM + D) 600-200 MG-UNIT TABS Take 1 tablet by mouth daily.     . Clobetasol Prop Emollient Base 0.05 % emollient cream APPLY 1 APPLICATION ONCE A WEEK AS NEEDED EXTERNALLY 90 DAYS    . DULoxetine (CYMBALTA) 30 MG capsule TAKE ONE CAPSULE BY MOUTH ONCE DAILY along with 60mg     . DULoxetine (CYMBALTA) 60 MG capsule Take 90 mg by mouth daily.     . fluocinonide (LIDEX) 0.05 % external solution Apply 1 application topically 2 (two) times daily as needed (skin irritation).     . folic acid (FOLVITE) 1 MG tablet Take 1 mg by mouth daily.    . Glucosamine-Chondroitin (MOVE FREE PO) Take by mouth daily.    Marland Kitchen  HYDROcodone-acetaminophen (NORCO) 7.5-325 MG tablet Take 1 tablet by mouth every 6 (six) hours as needed for moderate pain or severe pain. 100 tablet 0  . methocarbamol (ROBAXIN) 500 MG tablet Take 1 tablet (500 mg total) by mouth 4 (four) times daily. 120 tablet 2  . methotrexate (RHEUMATREX) 2.5 MG tablet Take 10 mg by mouth once a week. Caution:Chemotherapy. Protect from light.  Taking 7 pills per day    . Multiple Vitamin (MULTI-VITAMIN PO) Take 1 tablet by mouth daily.     . Omega-3 Fatty Acids (FISH OIL PO) Take 1,000 mg by mouth daily.     Marland Kitchen omeprazole (PRILOSEC) 40 MG capsule Take 1 capsule by mouth daily.  1  . OSPHENA 60 MG TABS Take 1 tablet by mouth daily.    . predniSONE (DELTASONE) 10 MG tablet Take 10 mg by mouth daily with breakfast.    . PREMARIN vaginal cream Place 0.5 g vaginally 2 (two) times a week.    . Probiotic Product (PROBIOTIC DAILY PO) Take by mouth.    . valsartan (DIOVAN) 40 MG tablet Take 40 mg by mouth daily.     No current facility-administered medications for this visit.    Allergies:   Morphine, Advair diskus [fluticasone-salmeterol], Ambien [zolpidem tartrate], Amlodipine besylate, Bextra [valdecoxib], Captopril, Latex, Lisinopril, Pulmicort [budesonide], Savella [milnacipran hcl], and Spiriva [tiotropium bromide monohydrate]    Social History:  The patient  reports that she quit smoking about 20 years ago. Her smoking use included cigarettes. She has a 80.00 pack-year smoking history. She has never used smokeless tobacco. She reports previous alcohol use. She reports that she does not use drugs.   Family History:  The patient's family history includes Allergies in her mother; Cancer in her father; Diabetes in her mother; Emphysema in her father; Heart attack (age of onset: 74) in her father; Heart attack (age of onset: 65) in her brother; Heart disease in her father; Hypertension in her mother.    ROS:  Please see the history of present illness.    Otherwise, review of systems are positive for back pain.   All other systems are reviewed and negative.    PHYSICAL EXAM: VS:  BP (!) 146/84   Pulse 79   Temp (!) 97.3 F (36.3 C)   Resp 20   Ht 5' 6.5" (1.689 m)   Wt 203 lb 9.6 oz (92.4 kg)   SpO2 94%   BMI 32.37 kg/m  , BMI Body mass index is 32.37 kg/m. GENERAL:  Well appearing HEENT:  Pupils equal round and reactive, fundi not visualized, oral mucosa unremarkable NECK:  No jugular venous distention, waveform within normal limits, carotid upstroke brisk and symmetric, no bruits, no thyromegaly LYMPHATICS:  No cervical, inguinal adenopathy  LUNGS:  Clear to auscultation bilaterally BACK:  No CVA tenderness CHEST:  Unremarkable HEART:  PMI not displaced or sustained,S1 and S2 within normal limits, no S3, no S4, no clicks, no rubs, no murmurs ABD:  Flat, positive bowel sounds normal in frequency in pitch, no bruits, no rebound, no guarding, no midline pulsatile mass, no hepatomegaly, no splenomegaly EXT:  2 plus pulses throughout, no edema, no cyanosis no clubbing SKIN:  No rashes no nodules NEURO:  Cranial nerves II through XII grossly intact, motor grossly intact throughout PSYCH:  Cognitively intact, oriented to person place and time    EKG:  EKG is ordered today. The ekg ordered today demonstrates sinus rhythm, rate 72, left axis deviation, no acute ST-T wave changes.   Recent Labs: No results found for requested labs within last 8760 hours.    Lipid Panel No results found for: CHOL, TRIG, HDL, CHOLHDL, VLDL, LDLCALC, LDLDIRECT    Wt Readings from Last 3 Encounters:  07/03/19 203 lb 9.6 oz (92.4 kg)  06/19/19 205 lb 9.6 oz (93.3 kg)  01/18/19 207 lb (93.9 kg)      Other studies Reviewed: Additional studies/ records that were reviewed today include: Labs and primary care records . Review of the above records demonstrates:  Please see elsewhere in the note.     ASSESSMENT AND PLAN:  PRESYNCOPE: The patient is  having episodes possibly related to her blood pressure dropping.  I am going to have her wear a 24-hour blood pressure monitor.  I am not can make any changes to her regimen until I see what her fluctuations are.  I am also going to apply a 3-day monitor.  Further evaluation will be based on this.  HTN: This will be assessed as above.  COVID EDUCATION: She has had her vaccine.   Current medicines are reviewed at length with the patient today.  The patient does not have concerns regarding medicines.  The following changes have been made:  no change  Labs/ tests ordered today include:   Orders Placed This Encounter  Procedures  . CAR 24HR BLOOD PRESSURE MONITOR  . LONG TERM MONITOR (3-14 DAYS)  . EKG 12-Lead     Disposition:   FU with me after the above studies.     Signed, Minus Breeding, MD  07/03/2019 1:17 PM    [place]  '

## 2019-07-03 ENCOUNTER — Encounter: Payer: Self-pay | Admitting: Cardiology

## 2019-07-03 ENCOUNTER — Telehealth: Payer: Self-pay | Admitting: *Deleted

## 2019-07-03 ENCOUNTER — Other Ambulatory Visit: Payer: Self-pay

## 2019-07-03 ENCOUNTER — Ambulatory Visit: Payer: Medicare Other | Admitting: Cardiology

## 2019-07-03 VITALS — BP 146/84 | HR 79 | Temp 97.3°F | Resp 20 | Ht 66.5 in | Wt 203.6 lb

## 2019-07-03 DIAGNOSIS — R0602 Shortness of breath: Secondary | ICD-10-CM

## 2019-07-03 DIAGNOSIS — I10 Essential (primary) hypertension: Secondary | ICD-10-CM

## 2019-07-03 DIAGNOSIS — Z7189 Other specified counseling: Secondary | ICD-10-CM

## 2019-07-03 DIAGNOSIS — R072 Precordial pain: Secondary | ICD-10-CM

## 2019-07-03 DIAGNOSIS — R002 Palpitations: Secondary | ICD-10-CM

## 2019-07-03 NOTE — Patient Instructions (Signed)
Medication Instructions:  No changes *If you need a refill on your cardiac medications before your next appointment, please call your pharmacy*  Lab Work: None ordered this visit  Testing/Procedures: Your physician has recommended that you wear an event monitor. Event monitors are medical devices that record the heart's electrical activity. Doctors most often [place] these monitors to diagnose arrhythmias. Arrhythmias are problems with the speed or rhythm of the heartbeat. The monitor is a small, portable device. You can wear one while you do your normal daily activities. This is usually used to diagnose what is causing palpitations/syncope (passing out).  24 Hour Blood Pressure Monitor  Follow-Up: At [facility], you and your health needs are our priority.  As part of our continuing mission to provide you with exceptional heart care, we have created designated Provider Care Teams.  These Care Teams include your primary Cardiologist (physician) and Advanced Practice Providers (APPs -  Physician Assistants and Nurse Practitioners) who all work together to provide you with the care you need, when you need it.  Your next appointment:   1 month(s)  The format for your next appointment:   In Person  Provider:   Minus Breeding, MD  Other Instructions: Your physician has recommended that you wear a 3 DAY ZIO-PATCH monitor. The Zio patch cardiac monitor continuously records heart rhythm data for up to 14 days, this is for patients being evaluated for multiple types heart rhythms. For the first 24 hours post application, please avoid getting the Zio monitor wet in the shower or by excessive sweating during exercise. After that, feel free to carry on with regular activities. Keep soaps and lotions away from the ZIO XT Patch.  This will be mailed to you, please expect 7-10 days to receive.  Can be placed at our [facility] location - [address].

## 2019-07-04 ENCOUNTER — Ambulatory Visit (INDEPENDENT_AMBULATORY_CARE_PROVIDER_SITE_OTHER): Payer: Medicare Other

## 2019-07-04 DIAGNOSIS — I10 Essential (primary) hypertension: Secondary | ICD-10-CM

## 2019-07-04 DIAGNOSIS — R002 Palpitations: Secondary | ICD-10-CM

## 2019-07-05 ENCOUNTER — Other Ambulatory Visit: Payer: Medicare Other

## 2019-07-12 ENCOUNTER — Ambulatory Visit: Payer: Medicare Other | Attending: Anesthesiology | Admitting: Anesthesiology

## 2019-07-12 ENCOUNTER — Telehealth: Payer: Self-pay | Admitting: *Deleted

## 2019-07-12 ENCOUNTER — Encounter: Payer: Self-pay | Admitting: Anesthesiology

## 2019-07-12 ENCOUNTER — Other Ambulatory Visit: Payer: Self-pay

## 2019-07-12 DIAGNOSIS — M79642 Pain in left hand: Secondary | ICD-10-CM

## 2019-07-12 DIAGNOSIS — M5432 Sciatica, left side: Secondary | ICD-10-CM

## 2019-07-12 DIAGNOSIS — M5136 Other intervertebral disc degeneration, lumbar region: Secondary | ICD-10-CM | POA: Diagnosis not present

## 2019-07-12 DIAGNOSIS — M79641 Pain in right hand: Secondary | ICD-10-CM

## 2019-07-12 DIAGNOSIS — G894 Chronic pain syndrome: Secondary | ICD-10-CM

## 2019-07-12 DIAGNOSIS — M25552 Pain in left hip: Secondary | ICD-10-CM

## 2019-07-12 DIAGNOSIS — M25551 Pain in right hip: Secondary | ICD-10-CM

## 2019-07-12 DIAGNOSIS — F1190 Opioid use, unspecified, uncomplicated: Secondary | ICD-10-CM

## 2019-07-12 DIAGNOSIS — M0579 Rheumatoid arthritis with rheumatoid factor of multiple sites without organ or systems involvement: Secondary | ICD-10-CM | POA: Diagnosis not present

## 2019-07-12 DIAGNOSIS — M4716 Other spondylosis with myelopathy, lumbar region: Secondary | ICD-10-CM | POA: Diagnosis not present

## 2019-07-12 DIAGNOSIS — M5431 Sciatica, right side: Secondary | ICD-10-CM | POA: Diagnosis not present

## 2019-07-12 DIAGNOSIS — M797 Fibromyalgia: Secondary | ICD-10-CM

## 2019-07-12 MED ORDER — HYDROCODONE-ACETAMINOPHEN 7.5-325 MG PO TABS: 1.0000 | ORAL_TABLET | Freq: Four times a day (QID) | ORAL | 0 refills | Status: DC | PRN

## 2019-07-12 MED ORDER — HYDROCODONE-ACETAMINOPHEN 7.5-325 MG PO TABS: 1.0000 | ORAL_TABLET | Freq: Four times a day (QID) | ORAL | 0 refills | Status: AC | PRN

## 2019-07-12 NOTE — Progress Notes (Signed)
Virtual Visit via Telephone Note  I connected with Kelsey Diaz on 07/12/19 at  1:30 PM EDT by telephone and verified that I am speaking with the correct person using two identifiers.  Location: Patient: Home Provider: Pain control center   I discussed the limitations, risks, security and privacy concerns of performing an evaluation and management service by telephone and the availability of in person appointments. I also discussed with the patient that there may be a patient responsible charge related to this service. The patient expressed understanding and agreed to proceed.   History of Present Illness: I spoke with Kelsey Diaz via telephone as she was unable to do the video portion of the virtual conference.  She reports that her low back pain has been relatively stable but considerable in severity.  This is baseline for her.  No changes in lower extremity strength or function or bowel or bladder function are noted.  She has rheumatoid arthritis and has diffuse body pain secondary to this in addition to fibromyalgia.  She is generally taking her Vicodin up to 4 times a day and we did discuss this issue at her last visit.  Furthermore they have weaned back on her meloxicam secondary to an increase in creatinine and concerns over her kidney function.  She is also had some cardiovascular issues as well that are currently being addressed.  Otherwise she gets good relief with the medication and no untoward side effects are noted    Observations/Objective:  Current Outpatient Medications:  .  albuterol (PROAIR HFA) 108 (90 BASE) MCG/ACT inhaler, Inhale 2 puffs into the lungs every 6 (six) hours as needed for shortness of breath. , Disp: , Rfl:  .  Calcium Carbonate-Vitamin D (CALCIUM + D) 600-200 MG-UNIT TABS, Take 1 tablet by mouth daily. , Disp: , Rfl:  .  Clobetasol Prop Emollient Base 0.05 % emollient cream, APPLY 1 APPLICATION ONCE A WEEK AS NEEDED EXTERNALLY 90 DAYS, Disp: , Rfl:  .   DULoxetine (CYMBALTA) 30 MG capsule, TAKE ONE CAPSULE BY MOUTH ONCE DAILY along with 60mg , Disp: , Rfl:  .  DULoxetine (CYMBALTA) 60 MG capsule, Take 90 mg by mouth daily. , Disp: , Rfl:  .  fluocinonide (LIDEX) 0.05 % external solution, Apply 1 application topically 2 (two) times daily as needed (skin irritation). , Disp: , Rfl:  .  folic acid (FOLVITE) 1 MG tablet, Take 1 mg by mouth daily., Disp: , Rfl:  .  Glucosamine-Chondroitin (MOVE FREE PO), Take by mouth daily., Disp: , Rfl:  .  [START ON 07/16/2019] HYDROcodone-acetaminophen (NORCO) 7.5-325 MG tablet, Take 1 tablet by mouth every 6 (six) hours as needed for moderate pain or severe pain., Disp: 120 tablet, Rfl: 0 .  [START ON 08/15/2019] HYDROcodone-acetaminophen (NORCO) 7.5-325 MG tablet, Take 1 tablet by mouth every 6 (six) hours as needed for moderate pain or severe pain., Disp: 120 tablet, Rfl: 0 .  methocarbamol (ROBAXIN) 500 MG tablet, Take 1 tablet (500 mg total) by mouth 4 (four) times daily., Disp: 120 tablet, Rfl: 2 .  methotrexate (RHEUMATREX) 2.5 MG tablet, Take 10 mg by mouth once a week. Caution:Chemotherapy. Protect from light.  Taking 7 pills per day, Disp: , Rfl:  .  Multiple Vitamin (MULTI-VITAMIN PO), Take 1 tablet by mouth daily. , Disp: , Rfl:  .  Omega-3 Fatty Acids (FISH OIL PO), Take 1,000 mg by mouth daily. , Disp: , Rfl:  .  omeprazole (PRILOSEC) 40 MG capsule, Take 1 capsule by mouth  daily., Disp: , Rfl: 1 .  OSPHENA 60 MG TABS, Take 1 tablet by mouth daily., Disp: , Rfl:  .  predniSONE (DELTASONE) 10 MG tablet, Take 10 mg by mouth daily with breakfast., Disp: , Rfl:  .  PREMARIN vaginal cream, Place 0.5 g vaginally 2 (two) times a week., Disp: , Rfl:  .  Probiotic Product (PROBIOTIC DAILY PO), Take by mouth., Disp: , Rfl:  .  valsartan (DIOVAN) 40 MG tablet, Take 40 mg by mouth daily., Disp: , Rfl:   Assessment and Plan:  1. DDD (degenerative disc disease), lumbar   2. Bilateral sciatica   3. Lumbar  spondylosis with myelopathy   4. Rheumatoid arthritis involving multiple sites with positive rheumatoid factor ([place])   5. Chronic pain syndrome   6. Chronic, continuous use of opioids   7. Fibromyalgia   8. Bilateral hip pain   9. Pain in both hands   10. Hip pain, acute, left   As discussed with her today I am going to refill her medications and I have reviewed the [facility] practitioner database information these will be dated for 523 and 622.  I am going to increase her to 4 times daily dosing for 120 tablets/month.  UDS was requested.  We will have her return to clinic in 2 months for follow-up.  Have also encouraged her to continue with back stretching strengthening exercises and aerobic conditioning as best possible.  Continue follow-up with her kidney doctors and cardiologist as well. Follow Up Instructions:    I discussed the assessment and treatment plan with the patient. The patient was provided an opportunity to ask questions and all were answered. The patient agreed with the plan and demonstrated an understanding of the instructions.   The patient was advised to call back or seek an in-person evaluation if the symptoms worsen or if the condition fails to improve as anticipated.  I provided 25 minutes of non-face-to-face time during this encounter.   Molli Barrows, MD

## 2019-07-21 LAB — TOXASSURE SELECT 13 (MW), URINE

## 2019-07-31 ENCOUNTER — Encounter: Payer: Self-pay | Admitting: Medical

## 2019-07-31 ENCOUNTER — Other Ambulatory Visit: Payer: Self-pay

## 2019-07-31 ENCOUNTER — Ambulatory Visit: Payer: Medicare Other | Admitting: Medical

## 2019-07-31 VITALS — BP 130/86 | HR 94 | Ht 66.5 in | Wt 200.6 lb

## 2019-07-31 DIAGNOSIS — I10 Essential (primary) hypertension: Secondary | ICD-10-CM | POA: Diagnosis not present

## 2019-07-31 DIAGNOSIS — I2510 Atherosclerotic heart disease of native coronary artery without angina pectoris: Secondary | ICD-10-CM

## 2019-07-31 DIAGNOSIS — R55 Syncope and collapse: Secondary | ICD-10-CM | POA: Diagnosis not present

## 2019-07-31 DIAGNOSIS — R002 Palpitations: Secondary | ICD-10-CM | POA: Diagnosis not present

## 2019-07-31 MED ORDER — METOPROLOL TARTRATE 25 MG PO TABS: 25.0000 mg | ORAL_TABLET | Freq: Two times a day (BID) | ORAL | 3 refills | Status: DC

## 2019-07-31 NOTE — Patient Instructions (Addendum)
Medication Instructions:  START- Metoprolol Tartrate 25 mg by mouth twice a day  *If you need a refill on your cardiac medications before your next appointment, please call your pharmacy*   Lab Work: None Ordered   Testing/Procedures: None ordered   Follow-Up: At [facility], you and your health needs are our priority.  As part of our continuing mission to provide you with exceptional heart care, we have created designated Provider Care Teams.  These Care Teams include your primary Cardiologist (physician) and Advanced Practice Providers (APPs -  Physician Assistants and Nurse Practitioners) who all work together to provide you with the care you need, when you need it.  We recommend signing up for the patient portal called "MyChart".  Sign up information is provided on this After Visit Summary.  MyChart is used to connect with patients for Virtual Visits (Telemedicine).  Patients are able to view lab/test results, encounter notes, upcoming appointments, etc.  Non-urgent messages can be sent to your provider as well.   To learn more about what you can do with MyChart, go to [email or web address].    Your next appointment:   3 month(s)  The format for your next appointment:   In Person  Provider:   You may see Minus Breeding, MD or one of the following Advanced Practice Providers on your designated Care Team:    Rosaria Ferries, PA-C  Jory Sims, DNP, ANP  Cadence Kathlen Mody, NP     Supraventricular Tachycardia, Adult Supraventricular tachycardia (SVT) is a kind of abnormal heartbeat. It makes your heart beat very fast and then beat at a normal speed. A normal resting heartbeat is 60-100 times a minute. This condition can make your heart beat more than 150 times a minute. Times of having a fast heartbeat (episodes) can be scary, but they are usually not dangerous. In some cases, they may lead to heart failure if:  They happen many times per day.  Last longer than a few  seconds. What are the causes?  A normal heartbeat starts when an area called the sinoatrial node sends out an electrical signal. In SVT, other areas of the heart send out signals that get in the way of the signal from the sinoatrial node. What increases the risk? You are more likely to develop this condition if you are:  13-84 years old.  A woman. The following factors may make you more likely to develop this condition:  Stress.  Tiredness.  Smoking.  Stimulant drugs, such as cocaine and methamphetamine.  Alcohol.  Caffeine.  Pregnancy.  Feeling worried or nervous (anxiety). What are the signs or symptoms?  A pounding heart.  A feeling that your heart is skipping beats (palpitations).  Weakness.  Trouble getting enough air.  Pain or tightness in your chest.  Feeling like you are going to pass out (faint).  Feeling worried or nervous.  Dizziness.  Sweating.  Feeling sick to your stomach (nausea).  Passing out.  Tiredness. Sometimes, there are no symptoms. How is this treated?  Vagal nerve stimulation. Ways to do this include: ? Holding your breath and pushing, as though you are pooping (having a bowel movement). ? Massaging an area on one side of your neck. Do not try this yourself. Only a doctor should do this. If done the wrong way, it can lead to a stroke. ? Bending forward with your head between your legs. ? Coughing while bending forward with your head between your legs. ? Closing your eyes and massaging your eyeballs.  Ask a doctor how to do this.  Medicines that prevent attacks.  Medicine to stop an attack given through an IV tube at the hospital.  A small electric shock (cardioversion) that stops an attack.  Radiofrequency ablation. In this procedure, a small, thin tube (catheter) is used to send energy to the area that is causing the rapid heartbeats. If you do not have symptoms, you may not need treatment. Follow these instructions at  home: Stress  Avoid things that make you feel stressed.  To deal with stress, try: ? Doing yoga or meditation, or being out in nature. ? Listening to relaxing music. ? Doing deep breathing. ? Taking steps to be healthy, such as getting lots of sleep, exercising, and eating a balanced diet. ? Talking with a mental health doctor. Lifestyle   Try to get at least 7 hours of sleep each night.  Do not use any products that contain nicotine or tobacco, such as cigarettes, e-cigarettes, and chewing tobacco. If you need help quitting, ask your doctor.  Be aware of how alcohol affects you. ? If alcohol gives you a fast heartbeat, do not drink alcohol. ? If alcohol does not seem to give you a fast heartbeat, limit alcohol use to no more than 1 drink a day for women who are not pregnant, and 2 drinks a day for men. In the [place] one drink is one of these:  12 oz of beer (355 mL).  5 oz of wine (148 mL).  1 oz of hard liquor (44 mL).  Be aware of how caffeine affects you. ? If caffeine gives you a fast heartbeat, do not eat, drink, or use anything with caffeine in it. ? If caffeine does not seem to give you a fast heartbeat, limit how much caffeine you eat, drink, or use.  Do not use stimulant drugs. If you need help quitting, ask your doctor. General instructions  Stay at a healthy weight.  Exercise regularly. Ask your doctor about good activities for you. Try one or a mixture of these: ? 150 minutes a week of gentle exercise, like walking or yoga. ? 75 minutes a week of exercise that is very active, like running or swimming.  Do vagus nerve treatments to slow down your heartbeat as told by your doctor.  Take over-the-counter and prescription medicines only as told by your doctor.  Keep all follow-up visits as told by your doctor. This is important. Contact a doctor if:  You have a fast heartbeat more often.  Times of having a fast heartbeat last longer than before.  Home  treatments to slow down your heartbeat do not help.  You have new symptoms. Get help right away if:  You have chest pain.  Your symptoms get worse.  You have trouble breathing.  Your heart beats very fast for more than 20 minutes.  You pass out. These symptoms may be an emergency. Do not wait to see if the symptoms will go away. Get medical help right away. Call your local emergency services (911 in the [place] Do not drive yourself to the hospital. Summary  SVT is a type of abnormal heart beat.  This condition can make your heart beat more than 150 times a minute.  Treatment depends on how often the condition happens and your symptoms. This information is not intended to replace advice given to you by your health care provider. Make sure you discuss any questions you have with your health care provider. Document Revised: 12/28/2017  Document Reviewed: 12/28/2017 Elsevier Patient Education  [employer].

## 2019-07-31 NOTE — Progress Notes (Signed)
Cardiology Office Note   Date:  08/02/2019   ID:  Kelsey Diaz, Kelsey Diaz 07-Sep-1945, MRN [id number]  PCP:  Harlan Stains, MD  Cardiologist:  Minus Breeding, MD EP: None  Chief Complaint  Patient presents with  . Follow-up    HTN, syncope      History of Present Illness: Kelsey Diaz is a 74 y.o. female with PMH of HTN, presyncope, COPD, hyperthyroidism, RA, OSA not on CPAP, and GERD who presents for follow-up of her HTN and presyncope.   She was last evaluated by cardiology at an outpatient visit with Dr. Percival Spanish 07/03/19, at which time she had complaints of labile blood pressures and ongoing sensations of presyncope. She was recommended to undergo a cardiac monitor and 24 hour BP monitor to further evaluate her symptoms. Her 3 day cardiac monitor showed predominately NSR with occasional SVT noted with brief runs no longer than 5 beats, otherwise no arrhythmias. Her 24 hour BP monitor showed SBP mostly in the 150s-160s range with spikes to 180s-190s in early sleeping hours. She was subsequently restarted on valsartan. Her last ischemic evaluation was a LHC in 2019 which showed minimal ostial-pLAD stenosis with normal LV function with EF 55%.   She presents today for follow-up of her HTN/presyncope. She has had one presyncopal event since her last visit with Dr. Percival Spanish - she was preparing dinner at the time and felt a wave of lightheadedness wash over her and slid down the wall to the ground where she quickly regained focus. She continues to have intermittent tachy palpitations. She brings a log of her BP/HR over the past 2 weeks. On one occasion her HR was 171. She reported feeling her heart race at that time which lasted for several minutes and felt a little lightheaded as well. BP was 109/74 which is by far the lowest reading as her BP is otherwise 130s-150s/70-90s. We reviewed her recent heart monitor. She reports she did not have any sensations of persistent tachy palpitations or  pre-syncope/syncope while wearing the monitor which frustrates her. She feels there is something cardiac going on despite her reassuring work-up. She was noted to have brief runs of SVT on her monitor and question whether more prolonged episodes are contributing to her symptoms. Otherwise she has no complaints of chest pain, changes in her chronic SOB, LE edema, orthopnea, or PND. She has known OSA but was intolerant to CPAP.     Past Medical History:  Diagnosis Date  . Allergic rhinitis   . Anxiety   . Asthma   . Bilateral hip pain 11/28/2018  . Colitis   . COPD (chronic obstructive pulmonary disease) ([place])   . Fibromyalgia 05/16/2019  . GERD (gastroesophageal reflux disease)    With esophageal strictures  . Hypertension   . Hyperthyroidism   . OSA (obstructive sleep apnea) 02/09/2012   No mask.  Did not tolerate  . Osteoarthritis   . Rheumatoid arthritis ([place]) 10/03/2018    Past Surgical History:  Procedure Laterality Date  . BREAST BIOPSY     x2  . CATARACT EXTRACTION W/PHACO Left 09/27/2018   Procedure: CATARACT EXTRACTION PHACO AND INTRAOCULAR LENS PLACEMENT ([place]) LEFT;  Surgeon: Birder Robson, MD;  Location: [facility];  Service: Ophthalmology;  Laterality: Left;  . CATARACT EXTRACTION W/PHACO Right 11/29/2018   Procedure: CATARACT EXTRACTION PHACO AND INTRAOCULAR LENS PLACEMENT ([place]) RIGHT;  Surgeon: Birder Robson, MD;  Location: [facility];  Service: Ophthalmology;  Laterality: Right;  1:22 20.1% 16.64  .  CHOLECYSTECTOMY     2005  . KNEE SURGERY Right 2013   two torn ligaments repaired.   Marland Kitchen LEFT HEART CATH AND CORONARY ANGIOGRAPHY N/A 06/08/2017   Procedure: LEFT HEART CATH AND CORONARY ANGIOGRAPHY;  Surgeon: Troy Sine, MD;  Location: [facility];  Service: Cardiovascular;  Laterality: N/A;  . NASAL SINUS SURGERY    . VAGINAL HYSTERECTOMY       Current Outpatient Medications  Medication Sig Dispense Refill  . ACCU-CHEK GUIDE test  strip     . Accu-Chek Softclix Lancets lancets     . albuterol (PROAIR HFA) 108 (90 BASE) MCG/ACT inhaler Inhale 2 puffs into the lungs every 6 (six) hours as needed for shortness of breath.     . Blood Glucose Monitoring Suppl (ACCU-CHEK GUIDE ME) w/Device KIT     . Calcium Carbonate-Vitamin D (CALCIUM + D) 600-200 MG-UNIT TABS Take 1 tablet by mouth daily.     . Clobetasol Prop Emollient Base 0.05 % emollient cream APPLY 1 APPLICATION ONCE A WEEK AS NEEDED EXTERNALLY 90 DAYS    . DULoxetine (CYMBALTA) 60 MG capsule Take 120 mg by mouth daily.     . fluocinonide (LIDEX) 0.05 % external solution Apply 1 application topically 2 (two) times daily as needed (skin irritation).     . Glucosamine-Chondroitin (MOVE FREE PO) Take by mouth daily.    Marland Kitchen HYDROcodone-acetaminophen (NORCO) 7.5-325 MG tablet Take 1 tablet by mouth every 6 (six) hours as needed for moderate pain or severe pain. 120 tablet 0  . LORazepam (ATIVAN) 0.5 MG tablet Take 0.5 mg by mouth daily as needed.    . methocarbamol (ROBAXIN) 500 MG tablet Take 1 tablet (500 mg total) by mouth 4 (four) times daily. 120 tablet 2  . Multiple Vitamin (MULTI-VITAMIN PO) Take 1 tablet by mouth daily.     . Omega-3 Fatty Acids (FISH OIL PO) Take 1,000 mg by mouth daily.     Marland Kitchen omeprazole (PRILOSEC) 40 MG capsule Take 1 capsule by mouth daily.  1  . OSPHENA 60 MG TABS Take 1 tablet by mouth daily.    . predniSONE (DELTASONE) 10 MG tablet Take 5 mg by mouth daily with breakfast.     . Probiotic Product (PROBIOTIC DAILY PO) Take by mouth.    . valsartan (DIOVAN) 40 MG tablet Take 40 mg by mouth daily.    . metoprolol tartrate (LOPRESSOR) 25 MG tablet Take 1 tablet (25 mg total) by mouth 2 (two) times daily. 180 tablet 3   No current facility-administered medications for this visit.    Allergies:   Morphine, Advair diskus [fluticasone-salmeterol], Ambien [zolpidem tartrate], Amlodipine besylate, Bextra [valdecoxib], Captopril, Latex, Lisinopril,  Pulmicort [budesonide], Savella [milnacipran hcl], and Spiriva [tiotropium bromide monohydrate]    Social History:  The patient  reports that she quit smoking about 20 years ago. Her smoking use included cigarettes. She has a 80.00 pack-year smoking history. She has never used smokeless tobacco. She reports previous alcohol use. She reports that she does not use drugs.   Family History:  The patient's family history includes Allergies in her mother; Cancer in her father; Diabetes in her mother; Emphysema in her father; Heart attack (age of onset: 31) in her father; Heart attack (age of onset: 51) in her brother; Heart disease in her father; Hypertension in her mother.    ROS:  Please see the history of present illness.   Otherwise, review of systems are positive for none.   All other  systems are reviewed and negative.    PHYSICAL EXAM: VS:  BP 130/86   Pulse 94   Ht 5' 6.5" (1.689 m)   Wt 200 lb 9.6 oz (91 kg)   SpO2 92%   BMI 31.89 kg/m  , BMI Body mass index is 31.89 kg/m. GEN: Well nourished, well developed, in no acute distress HEENT: sclera anicteric Neck: no JVD, carotid bruits, or masses Cardiac: RRR; no murmurs, rubs, or gallops, no edema  Respiratory:  clear to auscultation bilaterally, normal work of breathing GI: soft, obese, nontender, nondistended, + BS MS: no deformity or atrophy Skin: warm and dry, no rash Neuro:  Strength and sensation are intact Psych: euthymic mood, full affect   EKG:  EKG is not ordered today.   Recent Labs: No results found for requested labs within last 8760 hours.    Lipid Panel No results found for: CHOL, TRIG, HDL, CHOLHDL, VLDL, LDLCALC, LDLDIRECT    Wt Readings from Last 3 Encounters:  07/31/19 200 lb 9.6 oz (91 kg)  07/03/19 203 lb 9.6 oz (92.4 kg)  06/19/19 205 lb 9.6 oz (93.3 kg)      Other studies Reviewed: Additional studies/ records that were reviewed today include:   Cardiac event monitor 07/04/19: Predominant  rhythm is NSR SVT noted with brief runs no longer than 5 beats. No sustained arrhythmias.  24Hr BP monitor 07/04/19: BP is elevated particularly during the evening hours. BP mostly in the 676 - 195 systolic range when elevated However, can be in the 180s to 190s in the early sleeping hours.   Left heart catheterization 2019:  Ost LAD to Prox LAD lesion is 5% stenosed.  The left ventricular systolic function is normal.  LV end diastolic pressure is normal.   Evidence for myocardial calcification involving the superior aspect of the very proximal near ostial LAD without significant focal intraluminal stenosis.  Normal intermediate, normal left circumflex, and normal large dominant RCA.  Normal LV function with an EF at approximately 55%.  RECOMMENDATION: Medical therapy.  The patient's chest pain has some atypical features; consider possible noncardiac etiology.  ASSESSMENT AND PLAN:  1. Palpitations: possible she is experiencing paroxysmal SVT. On home vitals check last week she had a HR in the 170s and felt palpitations at that time.  - Will start metoprolol 80m BID which I am hopeful will improve her palpitations and blood pressure.   2. Presyncope: possible her symptoms are related to paroxysmal SVT. No bradycardia or pauses noted on recent monitor, though she did not experience pre-syncope while wearing it.  - Will start metoprolol as above - If symptoms reoccur, could consider repeat cardiac monitoring for an extended period (14d vs 30d monitor)  3. HTN: BP 130/86 today - generally with SBP in the 130s-150s at home - Continue valsartan - Will start metoprolol as above  4. CAD: minimal LAD disease noted on LHC in 2019. No anginal complaints - Continue risk factor modifications    Current medicines are reviewed at length with the patient today.  The patient does not have concerns regarding medicines.  The following changes have been made:  As above  Labs/ tests  ordered today include:  No orders of the defined types were placed in this encounter.    Disposition:   Previously scheduled appt with Dr. HPercival Spanishlater this month - she can follow-up at that time if still having episodes of palpitations/pre-syncope, otherwise can follow up in 3 months.   Signed, KAbigail Butts PA-C  08/02/2019 11:36 PM

## 2019-08-08 ENCOUNTER — Other Ambulatory Visit: Payer: Self-pay

## 2019-08-08 ENCOUNTER — Other Ambulatory Visit
Admission: RE | Admit: 2019-08-08 | Discharge: 2019-08-08 | Disposition: A | Discharge status: Home / Self Care | Payer: Medicare Other | Source: Ambulatory Visit | Attending: Pulmonary Disease | Admitting: Pulmonary Disease

## 2019-08-08 DIAGNOSIS — Z20822 Contact with and (suspected) exposure to covid-19: Secondary | ICD-10-CM | POA: Diagnosis not present

## 2019-08-08 DIAGNOSIS — Z01812 Encounter for preprocedural laboratory examination: Secondary | ICD-10-CM | POA: Insufficient documentation

## 2019-08-09 LAB — SARS CORONAVIRUS 2 (TAT 6-24 HRS): SARS Coronavirus 2: NEGATIVE

## 2019-08-10 ENCOUNTER — Ambulatory Visit: Payer: Medicare Other | Admitting: Cardiology

## 2019-08-11 ENCOUNTER — Other Ambulatory Visit: Payer: Self-pay

## 2019-08-11 ENCOUNTER — Ambulatory Visit (INDEPENDENT_AMBULATORY_CARE_PROVIDER_SITE_OTHER): Payer: Medicare Other | Admitting: Pulmonary Disease

## 2019-08-11 DIAGNOSIS — R0602 Shortness of breath: Secondary | ICD-10-CM | POA: Diagnosis not present

## 2019-08-11 LAB — PULMONARY FUNCTION TEST
DL/VA % pred: 81 %
DL/VA: 3.29 ml/min/mmHg/L
DLCO cor % pred: 77 %
DLCO cor: 15.86 ml/min/mmHg
DLCO unc % pred: 77 %
DLCO unc: 15.86 ml/min/mmHg
FEF 25-75 Post: 1.88 L/sec
FEF 25-75 Pre: 1.34 L/sec
FEF2575-%Change-Post: 40 %
FEF2575-%Pred-Post: 103 %
FEF2575-%Pred-Pre: 73 %
FEV1-%Change-Post: 8 %
FEV1-%Pred-Post: 92 %
FEV1-%Pred-Pre: 85 %
FEV1-Post: 2.16 L
FEV1-Pre: 1.99 L
FEV1FVC-%Change-Post: 0 %
FEV1FVC-%Pred-Pre: 95 %
FEV6-%Change-Post: 9 %
FEV6-%Pred-Post: 101 %
FEV6-%Pred-Pre: 92 %
FEV6-Post: 3 L
FEV6-Pre: 2.75 L
FEV6FVC-%Change-Post: 0 %
FEV6FVC-%Pred-Post: 104 %
FEV6FVC-%Pred-Pre: 104 %
FVC-%Change-Post: 9 %
FVC-%Pred-Post: 97 %
FVC-%Pred-Pre: 89 %
FVC-Post: 3.02 L
FVC-Pre: 2.77 L
Post FEV1/FVC ratio: 71 %
Post FEV6/FVC ratio: 99 %
Pre FEV1/FVC ratio: 72 %
Pre FEV6/FVC Ratio: 99 %
RV % pred: 198 %
RV: 4.7 L
TLC % pred: 142 %
TLC: 7.62 L

## 2019-08-11 NOTE — Progress Notes (Signed)
Full PFT performed today. °

## 2019-08-15 ENCOUNTER — Ambulatory Visit: Payer: Medicare Other | Admitting: Cardiology

## 2019-08-31 ENCOUNTER — Other Ambulatory Visit: Payer: Self-pay

## 2019-08-31 ENCOUNTER — Ambulatory Visit (INDEPENDENT_AMBULATORY_CARE_PROVIDER_SITE_OTHER)
Admission: RE | Admit: 2019-08-31 | Discharge: 2019-08-31 | Disposition: A | Discharge status: Home / Self Care | Payer: Medicare Other | Source: Ambulatory Visit | Attending: Pulmonary Disease | Admitting: Pulmonary Disease

## 2019-08-31 DIAGNOSIS — R0602 Shortness of breath: Secondary | ICD-10-CM | POA: Diagnosis not present

## 2019-09-19 ENCOUNTER — Other Ambulatory Visit: Payer: Self-pay

## 2019-09-19 ENCOUNTER — Encounter: Payer: Self-pay | Admitting: Anesthesiology

## 2019-09-19 ENCOUNTER — Ambulatory Visit: Payer: Medicare Other | Attending: Anesthesiology | Admitting: Anesthesiology

## 2019-09-19 DIAGNOSIS — M4716 Other spondylosis with myelopathy, lumbar region: Secondary | ICD-10-CM

## 2019-09-19 DIAGNOSIS — M25551 Pain in right hip: Secondary | ICD-10-CM

## 2019-09-19 DIAGNOSIS — M5431 Sciatica, right side: Secondary | ICD-10-CM

## 2019-09-19 DIAGNOSIS — M797 Fibromyalgia: Secondary | ICD-10-CM

## 2019-09-19 DIAGNOSIS — M5136 Other intervertebral disc degeneration, lumbar region: Secondary | ICD-10-CM

## 2019-09-19 DIAGNOSIS — M79642 Pain in left hand: Secondary | ICD-10-CM

## 2019-09-19 DIAGNOSIS — M5432 Sciatica, left side: Secondary | ICD-10-CM

## 2019-09-19 DIAGNOSIS — F1190 Opioid use, unspecified, uncomplicated: Secondary | ICD-10-CM

## 2019-09-19 DIAGNOSIS — M0579 Rheumatoid arthritis with rheumatoid factor of multiple sites without organ or systems involvement: Secondary | ICD-10-CM

## 2019-09-19 DIAGNOSIS — M79641 Pain in right hand: Secondary | ICD-10-CM

## 2019-09-19 DIAGNOSIS — G894 Chronic pain syndrome: Secondary | ICD-10-CM

## 2019-09-19 DIAGNOSIS — M25552 Pain in left hip: Secondary | ICD-10-CM

## 2019-09-19 DIAGNOSIS — M51369 Other intervertebral disc degeneration, lumbar region without mention of lumbar back pain or lower extremity pain: Secondary | ICD-10-CM

## 2019-09-19 MED ORDER — HYDROCODONE-ACETAMINOPHEN 7.5-325 MG PO TABS: 1.0000 | ORAL_TABLET | Freq: Four times a day (QID) | ORAL | 0 refills | Status: AC | PRN

## 2019-09-19 MED ORDER — HYDROCODONE-ACETAMINOPHEN 7.5-325 MG PO TABS: 1.0000 | ORAL_TABLET | Freq: Four times a day (QID) | ORAL | 0 refills | Status: DC | PRN

## 2019-09-19 NOTE — Progress Notes (Signed)
Virtual Visit via Telephone Note  I connected with Kelsey Diaz on 09/19/19 at  2:15 PM EDT by telephone and verified that I am speaking with the correct person using two identifiers.  Location: Patient: Home Provider: Pain control center   I discussed the limitations, risks, security and privacy concerns of performing an evaluation and management service by telephone and the availability of in person appointments. I also discussed with the patient that there may be a patient responsible charge related to this service. The patient expressed understanding and agreed to proceed.   History of Present Illness: I spoke with Kelsey Diaz today via telephone as we were unable to do the video portion of the virtual conference.  She reports that her pain has been slightly worse in the low back and diffusely secondary to the increased care requirements with her husband.  He recently had some new onset seizure activity with associated dementia and she is currently dealing with that.  In reference to her pain syndrome it has been relatively stable though slightly more severe recently with some associated tension headaches.  She uses her Robaxin periodically to help with that and it does.  She is taking her hydrocodone 4 times a day and this keeps her pain under reasonable control whereas prior to the opioid management it was recalcitrant.  No change in the quality characteristic or distribution of the pain is otherwise noted.  Her lower extremity strength function has been stable as well.    Observations/Objective:  Current Outpatient Medications:  .  ACCU-CHEK GUIDE test strip, , Disp: , Rfl:  .  Accu-Chek Softclix Lancets lancets, , Disp: , Rfl:  .  albuterol (PROAIR HFA) 108 (90 BASE) MCG/ACT inhaler, Inhale 2 puffs into the lungs every 6 (six) hours as needed for shortness of breath. , Disp: , Rfl:  .  Blood Glucose Monitoring Suppl (ACCU-CHEK GUIDE ME) w/Device KIT, , Disp: , Rfl:  .  Calcium  Carbonate-Vitamin D (CALCIUM + D) 600-200 MG-UNIT TABS, Take 1 tablet by mouth daily. , Disp: , Rfl:  .  Clobetasol Prop Emollient Base 0.05 % emollient cream, APPLY 1 APPLICATION ONCE A WEEK AS NEEDED EXTERNALLY 90 DAYS, Disp: , Rfl:  .  DULoxetine (CYMBALTA) 60 MG capsule, Take 120 mg by mouth daily. , Disp: , Rfl:  .  fluocinonide (LIDEX) 0.05 % external solution, Apply 1 application topically 2 (two) times daily as needed (skin irritation). , Disp: , Rfl:  .  Glucosamine-Chondroitin (MOVE FREE PO), Take by mouth daily., Disp: , Rfl:  .  HYDROcodone-acetaminophen (NORCO) 7.5-325 MG tablet, Take 1 tablet by mouth every 6 (six) hours as needed for moderate pain or severe pain., Disp: 120 tablet, Rfl: 0 .  [START ON 10/19/2019] HYDROcodone-acetaminophen (NORCO) 7.5-325 MG tablet, Take 1 tablet by mouth every 6 (six) hours as needed for moderate pain or severe pain., Disp: 120 tablet, Rfl: 0 .  LORazepam (ATIVAN) 0.5 MG tablet, Take 0.5 mg by mouth daily as needed., Disp: , Rfl:  .  methocarbamol (ROBAXIN) 500 MG tablet, Take 1 tablet (500 mg total) by mouth 4 (four) times daily., Disp: 120 tablet, Rfl: 2 .  metoprolol tartrate (LOPRESSOR) 25 MG tablet, Take 1 tablet (25 mg total) by mouth 2 (two) times daily., Disp: 180 tablet, Rfl: 3 .  Multiple Vitamin (MULTI-VITAMIN PO), Take 1 tablet by mouth daily. , Disp: , Rfl:  .  Omega-3 Fatty Acids (FISH OIL PO), Take 1,000 mg by mouth daily. , Disp: ,  Rfl:  .  omeprazole (PRILOSEC) 40 MG capsule, Take 1 capsule by mouth daily., Disp: , Rfl: 1 .  OSPHENA 60 MG TABS, Take 1 tablet by mouth daily., Disp: , Rfl:  .  predniSONE (DELTASONE) 10 MG tablet, Take 5 mg by mouth daily with breakfast. , Disp: , Rfl:  .  Probiotic Product (PROBIOTIC DAILY PO), Take by mouth., Disp: , Rfl:  .  valsartan (DIOVAN) 40 MG tablet, Take 40 mg by mouth daily., Disp: , Rfl:   Assessment and Plan: 1. DDD (degenerative disc disease), lumbar   2. Bilateral sciatica   3. Lumbar  spondylosis with myelopathy   4. Rheumatoid arthritis involving multiple sites with positive rheumatoid factor ([place])   5. Chronic pain syndrome   6. Chronic, continuous use of opioids   7. Fibromyalgia   8. Bilateral hip pain   9. Pain in both hands   Based on our discussion today and upon review of the [facility] practitioner database information I am going to refill her medicines for the next 2 months be dated for today and a month from today.  I want her to continue with her stretching strengthening exercises as reviewed and continue with Robaxin secondarily to spasm in the low back and periodic tension headaches.  She is to continue follow-up with her primary care physicians with schedule return to clinic in 2 months.  Follow Up Instructions:    I discussed the assessment and treatment plan with the patient. The patient was provided an opportunity to ask questions and all were answered. The patient agreed with the plan and demonstrated an understanding of the instructions.   The patient was advised to call back or seek an in-person evaluation if the symptoms worsen or if the condition fails to improve as anticipated.  I provided 30 minutes of non-face-to-face time during this encounter.   Molli Barrows, MD

## 2019-09-26 ENCOUNTER — Ambulatory Visit: Payer: Medicare Other | Admitting: Pulmonary Disease

## 2019-10-16 ENCOUNTER — Telehealth: Payer: Self-pay | Admitting: Pulmonary Disease

## 2019-10-16 NOTE — Telephone Encounter (Signed)
Primary Pulmonologist: Mannam Last office visit and with whom: 06/19/19, Mannam What do we see them for (pulmonary problems): COPD with asthma, OSA Last OV assessment/plan:  Assessment:  COPD, moderate persistent asthma  PFTs reviewed. Although she does not have obstruction by F/F criteria at this curvature to the loop suggestive of minimal obstructive airway disease. Reduction in diffusion capacity noted.  There is no evidence of interstitial lung disease.  Has not tolerated multiple inhalers in the past including Advair, Breo, Trelegy. Currently on just albuterol as needed.  As per symptoms are not severe so okay to observe her off controller medication  Rheumatoid arthritis, on methotrexate This is a recent diagnosis We will get high-res CT and PFTs for baseline evaluation to make sure there is no interstitial lung disease.  GERD Follows with [facility].  Continues on PPI.  Obstructive sleep apnea Noncompliant with CPAP.  Discussed with patient and she is not interested in retrying CPAP therapy.  Health maintenance 03/16/2013-Prevnar 03/01/2017-Pneumovax  Plan/Recommendations: - Albuterol as needed - High-res CT, PFTs  Marshell Garfinkel MD [facility] 06/19/2019, 11:14 AM  CC: Harlan Stains, MD      Patient Instructions by Marshell Garfinkel, MD at 06/19/2019 11:00 AM Author: Marshell Garfinkel, MD Author Type: Physician Filed: 06/19/2019 11:24 AM  Note Status: Signed Cosign: Cosign Not Required Encounter Date: 06/19/2019  Editor: Marshell Garfinkel, MD (Physician)                 We will schedule you for high-resolution CT and PFTs for evaluation of the lung Continue your current inhalers as prescribed Follow-up in 3 months.    Instructions  We will schedule you for high-resolution CT and PFTs for evaluation of the lung Continue your current inhalers as prescribed Follow-up in 3 months       Was appointment offered to patient (explain)?  No,  was seen at a walk in clinic on Sunday 10/15/19, covid test -   Reason for call: sob, told her oxygen level was low, 93%.  She has had sinus infection that went to her chest, prescribed AMOXICILLIN/CLAVULANATE POTASSIUM875-125 MG TABS.  CT of chest done last month per patient, she is inquiring about results.  (examples of things to ask: : When did symptoms start? 1 week ago Fever?no Cough? Yes Productive?yes  Color to sputum? Green and brown More sputum than usual? No Wheezing? no Have you needed increased oxygen? No Are you taking your respiratory medications? Yes What over the counter measures have you tried?none, using nebulizer  Allergies  Allergen Reactions  . Morphine Other (See Comments)    Stopped heart  . Advair Diskus [Fluticasone-Salmeterol]     Sensitivity to smells  . Ambien [Zolpidem Tartrate]     irritable  . Amlodipine Besylate     Feels bad  . Bextra [Valdecoxib]     Stomach issues  . Captopril     Ache  . Latex Itching    Bandaids only  . Lisinopril     cough  . Pulmicort [Budesonide]     Worse breathing, sensitivity to smells  . Savella [Milnacipran Hcl]     Mental fog  . Spiriva [Tiotropium Bromide Monohydrate]     Dry mouth    Immunization History  Administered Date(s) Administered  . Influenza Split 11/24/2010  . Influenza Whole 11/24/2011  . Influenza, High Dose Seasonal PF 11/02/2016  . Pneumococcal Conjugate-13 03/16/2013  . Pneumococcal Polysaccharide-23 11/24/2010, 03/01/2017

## 2019-10-16 NOTE — Telephone Encounter (Signed)
Called and spoke with patient, relayed information to patient per Kelsey Quaker NP.   Patient states she sees Dr. Dema Severin next week and will follow up as recommended.  Nothing further needed.

## 2019-10-16 NOTE — Telephone Encounter (Signed)
10/16/2019  Sorry to hear the patient is having the symptoms.  Yes I can see the patient had a high-resolution CT chest that was ordered by Dr. Vaughan Browner completed on 08/31/2019.  Those results are listed below:  No findings of interstitial lung disease, mild diffuse bronchial wall thickening with mild to moderate centrilobular and paraseptal emphysema, aortic arthrosclerosis.  Based off these findings would not recommend additional treatment.  Patient can keep upcoming follow-up with Dr. Vaughan Browner in September/2021.  Patient needs to discuss with Dr. Dema Severin her primary care doctor about the aortic arthrosclerosis to ensure that cholesterol levels are well managed.  Please also route this message to Dr. Vaughan Browner.  Per the chart review from what I am seeing on 10/15/2019 it was recommended by the urgent care provider that patient present to the emergency room for further evaluation.  Patient left in stable condition and was supposed to present to [facility] emergency room in private vehicle.  Does not appear that patient ever presented to the emergency room for evaluation.  Wyn Quaker, FNP

## 2019-11-02 ENCOUNTER — Ambulatory Visit: Payer: Medicare Other | Admitting: Cardiology

## 2019-11-14 NOTE — Progress Notes (Deleted)
Cardiology Office Note   Date:  11/14/2019   ID:  Kelsey, Diaz [birth year], MRN [id number]  PCP:  Harlan Stains, MD Cardiologist:  Minus Breeding, MD  Electrphysiologist: None  Kelsey Diaz, [facility] 07/31/2019 Kelsey Ferries, PA-C   No chief complaint on file.   History of Present Illness: Kelsey Diaz is a 74 y.o. female with a history of HTN, presyncope, COPD, asthma, hyperthyroidism, RA, OSA not on CPAP, GERD, brief SVT on monitor  06/29 office visit for follow-up of her HTN and presyncope. Pt c/o palpitations, Metop 25 mg bid started, consider longer-term monitor if presyncope persists  Brand Males presents for ***  COVID status: un-vaccinated, had/did not have COVID Past Medical History:  Diagnosis Date  . Allergic rhinitis   . Anxiety   . Asthma   . Bilateral hip pain 11/28/2018  . Colitis   . COPD (chronic obstructive pulmonary disease) ([place])   . Fibromyalgia 05/16/2019  . GERD (gastroesophageal reflux disease)    With esophageal strictures  . Hypertension   . Hyperthyroidism   . OSA (obstructive sleep apnea) 02/09/2012   No mask.  Did not tolerate  . Osteoarthritis   . Rheumatoid arthritis ([place]) 10/03/2018    Past Surgical History:  Procedure Laterality Date  . BREAST BIOPSY     x2  . CATARACT EXTRACTION W/PHACO Left 09/27/2018   Procedure: CATARACT EXTRACTION PHACO AND INTRAOCULAR LENS PLACEMENT ([place]) LEFT;  Surgeon: Birder Robson, MD;  Location: [place];  Service: Ophthalmology;  Laterality: Left;  . CATARACT EXTRACTION W/PHACO Right 11/29/2018   Procedure: CATARACT EXTRACTION PHACO AND INTRAOCULAR LENS PLACEMENT ([place]) RIGHT;  Surgeon: Birder Robson, MD;  Location: [facility];  Service: Ophthalmology;  Laterality: Right;  1:22 20.1% 16.64  . CHOLECYSTECTOMY     2005  . KNEE SURGERY Right 2013   two torn ligaments repaired.   Marland Kitchen LEFT HEART CATH AND CORONARY ANGIOGRAPHY N/A 06/08/2017   Procedure: LEFT HEART CATH  AND CORONARY ANGIOGRAPHY;  Surgeon: Troy Sine, MD;  Location: [place] [facility];  Service: Cardiovascular;  Laterality: N/A;  . NASAL SINUS SURGERY    . VAGINAL HYSTERECTOMY      Current Outpatient Medications  Medication Sig Dispense Refill  . ACCU-CHEK GUIDE test strip     . Accu-Chek Softclix Lancets lancets     . albuterol (PROAIR HFA) 108 (90 BASE) MCG/ACT inhaler Inhale 2 puffs into the lungs every 6 (six) hours as needed for shortness of breath.     . Blood Glucose Monitoring Suppl (ACCU-CHEK GUIDE ME) w/Device KIT     . Calcium Carbonate-Vitamin D (CALCIUM + D) 600-200 MG-UNIT TABS Take 1 tablet by mouth daily.     . Clobetasol Prop Emollient Base 0.05 % emollient cream APPLY 1 APPLICATION ONCE A WEEK AS NEEDED EXTERNALLY 90 DAYS    . DULoxetine (CYMBALTA) 60 MG capsule Take 120 mg by mouth daily.     . fluocinonide (LIDEX) 0.05 % external solution Apply 1 application topically 2 (two) times daily as needed (skin irritation).     . Glucosamine-Chondroitin (MOVE FREE PO) Take by mouth daily.    Marland Kitchen HYDROcodone-acetaminophen (NORCO) 7.5-325 MG tablet Take 1 tablet by mouth every 6 (six) hours as needed for moderate pain or severe pain. 120 tablet 0  . LORazepam (ATIVAN) 0.5 MG tablet Take 0.5 mg by mouth daily as needed.    . methocarbamol (ROBAXIN) 500 MG tablet Take 1 tablet (500 mg total) by mouth  4 (four) times daily. 120 tablet 2  . metoprolol tartrate (LOPRESSOR) 25 MG tablet Take 1 tablet (25 mg total) by mouth 2 (two) times daily. 180 tablet 3  . Multiple Vitamin (MULTI-VITAMIN PO) Take 1 tablet by mouth daily.     . Omega-3 Fatty Acids (FISH OIL PO) Take 1,000 mg by mouth daily.     Marland Kitchen omeprazole (PRILOSEC) 40 MG capsule Take 1 capsule by mouth daily.  1  . OSPHENA 60 MG TABS Take 1 tablet by mouth daily.    . predniSONE (DELTASONE) 10 MG tablet Take 5 mg by mouth daily with breakfast.     . Probiotic Product (PROBIOTIC DAILY PO) Take by mouth.    . valsartan (DIOVAN) 40  MG tablet Take 40 mg by mouth daily.     No current facility-administered medications for this visit.    Allergies:   Morphine, Advair diskus [fluticasone-salmeterol], Ambien [zolpidem tartrate], Amlodipine besylate, Bextra [valdecoxib], Captopril, Latex, Lisinopril, Pulmicort [budesonide], Savella [milnacipran hcl], and Spiriva [tiotropium bromide monohydrate]    Social History:  The patient  reports that she quit smoking about 20 years ago. Her smoking use included cigarettes. She has a 80.00 pack-year smoking history. She has never used smokeless tobacco. She reports previous alcohol use. She reports that she does not use drugs.   Family History:  The patient's family history includes Allergies in her mother; Cancer in her father; Diabetes in her mother; Emphysema in her father; Heart attack (age of onset: 84) in her father; Heart attack (age of onset: 47) in her brother; Heart disease in her father; Hypertension in her mother.  She indicated that her mother is alive. She indicated that her father is deceased. She indicated that her brother is deceased.    ROS:  Please see the history of present illness. All other systems are reviewed and negative.    PHYSICAL EXAM: VS:  There were no vitals taken for this visit. , BMI There is no height or weight on file to calculate BMI. GEN: Well nourished, well developed, female in no acute distress HEENT: normal for age  Neck: no JVD, no carotid bruit, no masses Cardiac: RRR; no murmur, no rubs, or gallops Respiratory:  clear to auscultation bilaterally, normal work of breathing GI: soft, nontender, nondistended, + BS MS: no deformity or atrophy; no edema; distal pulses are 2+ in all 4 extremities  Skin: warm and dry, no rash Neuro:  Strength and sensation are intact Psych: euthymic mood, full affect   EKG:  EKG {ACTION; IS/IS IOE:[id number]} ordered today. The ekg ordered today demonstrates ***  CATH: 06/08/2017  Ost LAD to Prox LAD lesion  is 5% stenosed.  The left ventricular systolic function is normal.  LV end diastolic pressure is normal.   Evidence for myocardial calcification involving the superior aspect of the very proximal near ostial LAD without significant focal intraluminal stenosis.  Normal intermediate, normal left circumflex, and normal large dominant RCA.  Normal LV function with an EF at approximately 55%.  RECOMMENDATION: Medical therapy.  The patient's chest pain has some atypical features; consider possible noncardiac etiology.   MONITOR: 07/04/2019 Predominant rhythm is NSR SVT noted with brief runs no longer than 5 beats. No sustained arrhythmias.   Recent Labs: No results found for requested labs within last 8760 hours.  CBC    Component Value Date/Time   WBC 9.8 09/08/2017 2350   RBC 3.98 09/08/2017 2350   HGB 11.8 (L) 09/08/2017 2350   HGB 14.2 06/03/2017  1054   HCT 35.3 (L) 09/08/2017 2350   HCT 42.1 06/03/2017 1054   PLT 189 09/08/2017 2350   PLT 244 06/03/2017 1054   MCV 88.7 09/08/2017 2350   MCV 87 06/03/2017 1054   MCH 29.6 09/08/2017 2350   MCHC 33.4 09/08/2017 2350   RDW 12.9 09/08/2017 2350   RDW 13.1 06/03/2017 1054   LYMPHSABS 1.9 03/25/2017 1517   MONOABS 0.7 03/25/2017 1517   EOSABS 0.3 03/25/2017 1517   BASOSABS 0.1 03/25/2017 1517   CMP Latest Ref Rng & Units 09/13/2017 09/08/2017 06/03/2017  Glucose 70 - 99 mg/dL 98 130(H) 134(H)  BUN 6 - 23 mg/dL $Remove'11 16 19  'LXnMGbU$ Creatinine 0.40 - 1.20 mg/dL 0.72 0.62 0.69  Sodium 135 - 145 mEq/L 138 137 137  Potassium 3.5 - 5.1 mEq/L 3.4(L) 3.7 4.1  Chloride 96 - 112 mEq/L 100 103 100  CO2 19 - 32 mEq/L $Remove'30 25 21  'JfuQpar$ Calcium 8.4 - 10.5 mg/dL 8.9 8.8(L) 9.4  Total Protein 6.0 - 8.5 g/dL - - 6.9  Total Bilirubin 0.0 - 1.2 mg/dL - - 0.4  Alkaline Phos 39 - 117 IU/L - - 82  AST 0 - 40 IU/L - - 22  ALT 0 - 32 IU/L - - 20     Lipid Panel No results found for: CHOL, HDL, LDLCALC, LDLDIRECT, TRIG, CHOLHDL    Wt Readings from Last 3  Encounters:  07/31/19 200 lb 9.6 oz (91 kg)  07/03/19 203 lb 9.6 oz (92.4 kg)  06/19/19 205 lb 9.6 oz (93.3 kg)     Other studies Reviewed: Additional studies/ records that were reviewed today include: Office notes, hospital records and testing.  ASSESSMENT AND PLAN:  1.  ***   Current medicines are reviewed at length with the patient today.  The patient {ACTIONS; HAS/DOES NOT HAVE:19233} concerns regarding medicines.  The following changes have been made:  {PLAN; NO CHANGE:13088:s}  Labs/ tests ordered today include:  No orders of the defined types were placed in this encounter.    Disposition:   FU with Minus Breeding, MD  Signed, Kelsey Ferries, PA-C  11/14/2019 4:27 PM    [place] Phone: [phone]; Fax: [phone]

## 2019-11-15 ENCOUNTER — Encounter: Payer: Self-pay | Admitting: Anesthesiology

## 2019-11-15 ENCOUNTER — Other Ambulatory Visit: Payer: Self-pay

## 2019-11-15 ENCOUNTER — Ambulatory Visit: Payer: Medicare Other | Attending: Anesthesiology | Admitting: Anesthesiology

## 2019-11-15 DIAGNOSIS — G894 Chronic pain syndrome: Secondary | ICD-10-CM

## 2019-11-15 DIAGNOSIS — M51369 Other intervertebral disc degeneration, lumbar region without mention of lumbar back pain or lower extremity pain: Secondary | ICD-10-CM

## 2019-11-15 DIAGNOSIS — M79641 Pain in right hand: Secondary | ICD-10-CM

## 2019-11-15 DIAGNOSIS — M4716 Other spondylosis with myelopathy, lumbar region: Secondary | ICD-10-CM

## 2019-11-15 DIAGNOSIS — M0579 Rheumatoid arthritis with rheumatoid factor of multiple sites without organ or systems involvement: Secondary | ICD-10-CM

## 2019-11-15 DIAGNOSIS — M25551 Pain in right hip: Secondary | ICD-10-CM

## 2019-11-15 DIAGNOSIS — M5432 Sciatica, left side: Secondary | ICD-10-CM

## 2019-11-15 DIAGNOSIS — M5136 Other intervertebral disc degeneration, lumbar region: Secondary | ICD-10-CM | POA: Diagnosis not present

## 2019-11-15 DIAGNOSIS — M797 Fibromyalgia: Secondary | ICD-10-CM

## 2019-11-15 DIAGNOSIS — M25552 Pain in left hip: Secondary | ICD-10-CM

## 2019-11-15 DIAGNOSIS — M79642 Pain in left hand: Secondary | ICD-10-CM

## 2019-11-15 DIAGNOSIS — M47817 Spondylosis without myelopathy or radiculopathy, lumbosacral region: Secondary | ICD-10-CM

## 2019-11-15 DIAGNOSIS — M5431 Sciatica, right side: Secondary | ICD-10-CM

## 2019-11-15 DIAGNOSIS — F1190 Opioid use, unspecified, uncomplicated: Secondary | ICD-10-CM

## 2019-11-15 MED ORDER — HYDROCODONE-ACETAMINOPHEN 7.5-325 MG PO TABS: 1.0000 | ORAL_TABLET | Freq: Four times a day (QID) | ORAL | 0 refills | Status: DC | PRN

## 2019-11-15 NOTE — Progress Notes (Signed)
Virtual Visit via Telephone Note  I connected with Kelsey Diaz on 11/15/19 at  1:15 PM EDT by telephone and verified that I am speaking with the correct person using two identifiers.  Location: Patient: Home Provider: Pain control center   I discussed the limitations, risks, security and privacy concerns of performing an evaluation and management service by telephone and the availability of in person appointments. I also discussed with the patient that there may be a patient responsible charge related to this service. The patient expressed understanding and agreed to proceed.   History of Present Illness: I spoke with Kelsey Diaz today via telephone as she was unable to do the video portion of the virtual conference but she reports that her back pain is been doing reasonably well.  The medications continue to work effectively for her and keeping her back pain under better.  She has had 2 recently placed her husband in a nursing home and this increased activity has been troubling.  However, she reports that the medications are continuing to give her good relief.  Her last epidural was back in late 2020 however she has been able to keep her pain under control with medication management and physical therapy exercises between then and now.  No changes in lower extremity strength or function or bowel or bladder function are noted at this time and otherwise the quality of her pain is been stable in nature.   Observations/Objective:  Current Outpatient Medications:  .  ACCU-CHEK GUIDE test strip, , Disp: , Rfl:  .  Accu-Chek Softclix Lancets lancets, , Disp: , Rfl:  .  albuterol (PROAIR HFA) 108 (90 BASE) MCG/ACT inhaler, Inhale 2 puffs into the lungs every 6 (six) hours as needed for shortness of breath. , Disp: , Rfl:  .  Blood Glucose Monitoring Suppl (ACCU-CHEK GUIDE ME) w/Device KIT, , Disp: , Rfl:  .  Calcium Carbonate-Vitamin D (CALCIUM + D) 600-200 MG-UNIT TABS, Take 1 tablet by mouth  daily. , Disp: , Rfl:  .  Clobetasol Prop Emollient Base 0.05 % emollient cream, APPLY 1 APPLICATION ONCE A WEEK AS NEEDED EXTERNALLY 90 DAYS, Disp: , Rfl:  .  DULoxetine (CYMBALTA) 60 MG capsule, Take 120 mg by mouth daily. , Disp: , Rfl:  .  fluocinonide (LIDEX) 0.05 % external solution, Apply 1 application topically 2 (two) times daily as needed (skin irritation). , Disp: , Rfl:  .  Glucosamine-Chondroitin (MOVE FREE PO), Take by mouth daily., Disp: , Rfl:  .  HYDROcodone-acetaminophen (NORCO) 7.5-325 MG tablet, Take 1 tablet by mouth every 6 (six) hours as needed for moderate pain or severe pain., Disp: 120 tablet, Rfl: 0 .  LORazepam (ATIVAN) 0.5 MG tablet, Take 0.5 mg by mouth daily as needed., Disp: , Rfl:  .  methocarbamol (ROBAXIN) 500 MG tablet, Take 1 tablet (500 mg total) by mouth 4 (four) times daily., Disp: 120 tablet, Rfl: 2 .  metoprolol tartrate (LOPRESSOR) 25 MG tablet, Take 1 tablet (25 mg total) by mouth 2 (two) times daily., Disp: 180 tablet, Rfl: 3 .  Multiple Vitamin (MULTI-VITAMIN PO), Take 1 tablet by mouth daily. , Disp: , Rfl:  .  Omega-3 Fatty Acids (FISH OIL PO), Take 1,000 mg by mouth daily. , Disp: , Rfl:  .  omeprazole (PRILOSEC) 40 MG capsule, Take 1 capsule by mouth daily., Disp: , Rfl: 1 .  OSPHENA 60 MG TABS, Take 1 tablet by mouth daily., Disp: , Rfl:  .  predniSONE (DELTASONE) 10 MG  tablet, Take 5 mg by mouth daily with breakfast. , Disp: , Rfl:  .  Probiotic Product (PROBIOTIC DAILY PO), Take by mouth., Disp: , Rfl:  .  valsartan (DIOVAN) 40 MG tablet, Take 40 mg by mouth daily., Disp: , Rfl:  Assessment and Plan:  1. DDD (degenerative disc disease), lumbar   2. Bilateral sciatica   3. Lumbar spondylosis with myelopathy   4. Rheumatoid arthritis involving multiple sites with positive rheumatoid factor ([place])   5. Chronic pain syndrome   6. Chronic, continuous use of opioids   7. Fibromyalgia   8. Bilateral hip pain   9. Pain in both hands   10. Facet  arthritis of lumbosacral region   Based on our discussion today and upon review of the [place] practitioner database information going to refill her medications for the next 2 months dated for October 7 and November 6.  No changes in her pain management protocol will be initiated today.  I want her to continue with efforts at stretching strengthening exercises as discussed.  Furthermore she is to continue follow-up with her primary care physicians for her baseline medical care with return to clinic in 2 months. Follow Up Instructions:    I discussed the assessment and treatment plan with the patient. The patient was provided an opportunity to ask questions and all were answered. The patient agreed with the plan and demonstrated an understanding of the instructions.   The patient was advised to call back or seek an in-person evaluation if the symptoms worsen or if the condition fails to improve as anticipated.  I provided 30 minutes of non-face-to-face time during this encounter.   Molli Barrows, MD

## 2019-11-16 ENCOUNTER — Ambulatory Visit: Payer: Medicare Other | Admitting: Pulmonary Disease

## 2019-11-24 ENCOUNTER — Ambulatory Visit: Payer: Medicare Other | Admitting: Physician Assistant

## 2019-11-28 NOTE — Progress Notes (Signed)
Virtual Visit via Video Note   This visit type was conducted due to national recommendations for restrictions regarding the COVID-19 Pandemic (e.g. social distancing) in an effort to limit this patient's exposure and mitigate transmission in our community.  Due to her co-morbid illnesses, this patient is at least at moderate risk for complications without adequate follow up.  This format is felt to be most appropriate for this patient at this time.  All issues noted in this document were discussed and addressed.  A limited physical exam was performed with this format.  Please refer to the patient's chart for her consent to telehealth for [facility].     Evaluation Performed:  Follow-up visit  This visit type was conducted due to national recommendations for restrictions regarding the COVID-19 Pandemic (e.g. social distancing).  This format is felt to be most appropriate for this patient at this time.  All issues noted in this document were discussed and addressed.  No physical exam was performed (except for noted visual exam findings with Video Visits).  Please refer to the patient's chart (MyChart message for video visits and phone note for telephone visits) for the patient's consent to telehealth for [facility].  Date:  11/29/2019   ID:  Kelsey Diaz, DOB Aug 09, 1945, MRN [id number]  Patient Location:  Home  Provider location:   Office  PCP:  Harlan Stains, MD  Cardiologist:  Minus Breeding, MD  07/03/2019 K. Tommye Standard, [facility] 07/31/2019  Electrophysiologist:  None   Chief Complaint:  Follow up  History of Present Illness:    Kelsey Diaz is a 74 y.o. female who presents via audio/video conferencing for a telehealth visit today.    74 y.o. yo female who has a hx of PMH of HTN, presyncope, COPD, emphysema, hyperthyroidism, RA, OSA not on CPAP, and GERD. Monitor w/ 5 bt runs SVT, minimal CAD 2019 cath, labile BP on BP monitor.   07/31/2019 office visit w/  recurrent palpitations, HR 170, metop 25 mg bid started, consider repeat monitor if sx do not improve  The palpitations have greatly improved since being started on the beta-blocker, she does not feel any further work-up is indicated.  She gets a little lightheaded at times, the symptoms seem to be orthostatic in nature.  She does not get chest pain.  Has chronic DOE, no recent change.  She attributes this to her COPD and emphysema.  She does not wake with lower extremity edema, denies orthopnea or PND.  She says that her thyroid labs are abnormal, but she is getting mixed messages.  One doctor will tell her that they are okay and another doctor will tell her they are abnormal.  She is concerned about this  She is getting areas of ecchymosis on her forearms and hands.  She was able to show me these lesions and said she did not remember any trauma or impact that would have caused them.  I suggested that they might be coming from her chronic steroid use.   The patient does not have symptoms concerning for COVID-19 infection (fever, chills, cough, or new shortness of breath).    Prior CV studies:   The following studies were reviewed today:  Cardiac event monitor 07/04/19: Predominant rhythm is NSR SVT noted with brief runs no longer than 5 beats. No sustained arrhythmias.  24Hr BP monitor 07/04/19: BP is elevated particularly during the evening hours. BP mostly in the 413 - 244 systolic range when elevated  However, can be in the 180s to 190s in the early sleeping hours.   Left heart catheterization 2019:  Ost LAD to Prox LAD lesion is 5% stenosed.  The left ventricular systolic function is normal.  LV end diastolic pressure is normal.  Evidence for myocardial calcification involving the superior aspect of the very proximal near ostial LAD without significant focal intraluminal stenosis. Normal intermediate, normal left circumflex, and normal large dominant RCA.  Normal LV  function with an EF at approximately 55%.  RECOMMENDATION: Medical therapy. The patient's chest pain has some atypical features; consider possible noncardiac etiology.  Past Medical History:  Diagnosis Date  . Allergic rhinitis   . Anxiety   . Asthma   . Bilateral hip pain 11/28/2018  . Colitis   . COPD (chronic obstructive pulmonary disease) ([place])   . Fibromyalgia 05/16/2019  . GERD (gastroesophageal reflux disease)    With esophageal strictures  . Hypertension   . Hyperthyroidism   . OSA (obstructive sleep apnea) 02/09/2012   No mask.  Did not tolerate  . Osteoarthritis   . Rheumatoid arthritis ([place]) 10/03/2018   Past Surgical History:  Procedure Laterality Date  . BREAST BIOPSY     x2  . CATARACT EXTRACTION W/PHACO Left 09/27/2018   Procedure: CATARACT EXTRACTION PHACO AND INTRAOCULAR LENS PLACEMENT ([place]) LEFT;  Surgeon: Birder Robson, MD;  Location: [facility];  Service: Ophthalmology;  Laterality: Left;  . CATARACT EXTRACTION W/PHACO Right 11/29/2018   Procedure: CATARACT EXTRACTION PHACO AND INTRAOCULAR LENS PLACEMENT ([place]) RIGHT;  Surgeon: Birder Robson, MD;  Location: [facility];  Service: Ophthalmology;  Laterality: Right;  1:22 20.1% 16.64  . CHOLECYSTECTOMY     2005  . KNEE SURGERY Right 2013   two torn ligaments repaired.   Marland Kitchen LEFT HEART CATH AND CORONARY ANGIOGRAPHY N/A 06/08/2017   Procedure: LEFT HEART CATH AND CORONARY ANGIOGRAPHY;  Surgeon: Troy Sine, MD;  Location: [place] [facility];  Service: Cardiovascular;  Laterality: N/A;  . NASAL SINUS SURGERY    . VAGINAL HYSTERECTOMY       Current Meds  Medication Sig  . ACCU-CHEK GUIDE test strip   . Accu-Chek Softclix Lancets lancets   . albuterol (PROAIR HFA) 108 (90 BASE) MCG/ACT inhaler Inhale 2 puffs into the lungs every 6 (six) hours as needed for shortness of breath.   . Blood Glucose Monitoring Suppl (ACCU-CHEK GUIDE ME) w/Device KIT   . Calcium Carbonate-Vitamin D (CALCIUM  + D) 600-200 MG-UNIT TABS Take 1 tablet by mouth daily.   . Clobetasol Prop Emollient Base 0.05 % emollient cream APPLY 1 APPLICATION ONCE A WEEK AS NEEDED EXTERNALLY 90 DAYS  . DULoxetine (CYMBALTA) 60 MG capsule Take 120 mg by mouth daily.   . fluocinonide (LIDEX) 0.05 % external solution Apply 1 application topically 2 (two) times daily as needed (skin irritation).   . Glucosamine-Chondroitin (MOVE FREE PO) Take by mouth daily.  Derrill Memo ON 11/29/2020] HYDROcodone-acetaminophen (NORCO) 7.5-325 MG tablet Take 1 tablet by mouth every 6 (six) hours as needed for moderate pain or severe pain.  Marland Kitchen LORazepam (ATIVAN) 0.5 MG tablet Take 0.5 mg by mouth daily as needed.  . methocarbamol (ROBAXIN) 500 MG tablet Take 500 mg by mouth daily.  . metoprolol tartrate (LOPRESSOR) 25 MG tablet Take 1 tablet (25 mg total) by mouth 2 (two) times daily.  . Multiple Vitamin (MULTI-VITAMIN PO) Take 1 tablet by mouth daily.   . Omega-3 Fatty Acids (FISH OIL PO) Take 1,000 mg by  mouth daily.   Marland Kitchen omeprazole (PRILOSEC) 40 MG capsule Take 1 capsule by mouth daily.  . OSPHENA 60 MG TABS Take 1 tablet by mouth daily.  . predniSONE (DELTASONE) 10 MG tablet Take 5 mg by mouth daily with breakfast.   . Probiotic Product (PROBIOTIC DAILY PO) Take by mouth.  . valsartan (DIOVAN) 40 MG tablet Take 40 mg by mouth daily.     Allergies:   Morphine, Advair diskus [fluticasone-salmeterol], Ambien [zolpidem tartrate], Amlodipine besylate, Bextra [valdecoxib], Captopril, Latex, Lisinopril, Pulmicort [budesonide], Savella [milnacipran hcl], and Spiriva [tiotropium bromide monohydrate]   Social History   Tobacco Use  . Smoking status: Former Smoker    Packs/day: 2.00    Years: 40.00    Pack years: 80.00    Types: Cigarettes    Quit date: 02/24/1999    Years since quitting: 20.7  . Smokeless tobacco: Never Used  Vaping Use  . Vaping Use: Never used  Substance Use Topics  . Alcohol use: Not Currently    Comment:    . Drug use:  No     Family Hx: The patient's family history includes Allergies in her mother; Cancer in her father; Diabetes in her mother; Emphysema in her father; Heart attack (age of onset: 89) in her father; Heart attack (age of onset: 27) in her brother; Heart disease in her father; Hypertension in her mother.  ROS:   Please see the history of present illness.    All other systems reviewed and are negative.   Labs/Other Tests and Data Reviewed:    Recent Labs: No results found for requested labs within last 8760 hours.   CBC    Component Value Date/Time   WBC 9.8 09/08/2017 2350   RBC 3.98 09/08/2017 2350   HGB 11.8 (L) 09/08/2017 2350   HGB 14.2 06/03/2017 1054   HCT 35.3 (L) 09/08/2017 2350   HCT 42.1 06/03/2017 1054   PLT 189 09/08/2017 2350   PLT 244 06/03/2017 1054   MCV 88.7 09/08/2017 2350   MCV 87 06/03/2017 1054   MCH 29.6 09/08/2017 2350   MCHC 33.4 09/08/2017 2350   RDW 12.9 09/08/2017 2350   RDW 13.1 06/03/2017 1054   LYMPHSABS 1.9 03/25/2017 1517   MONOABS 0.7 03/25/2017 1517   EOSABS 0.3 03/25/2017 1517   BASOSABS 0.1 03/25/2017 1517    CMP Latest Ref Rng & Units 09/13/2017 09/08/2017 06/03/2017  Glucose 70 - 99 mg/dL 98 130(H) 134(H)  BUN 6 - 23 mg/dL $Remove'11 16 19  'JkSdaPV$ Creatinine 0.40 - 1.20 mg/dL 0.72 0.62 0.69  Sodium 135 - 145 mEq/L 138 137 137  Potassium 3.5 - 5.1 mEq/L 3.4(L) 3.7 4.1  Chloride 96 - 112 mEq/L 100 103 100  CO2 19 - 32 mEq/L $Remove'30 25 21  'jKRsKjS$ Calcium 8.4 - 10.5 mg/dL 8.9 8.8(L) 9.4  Total Protein 6.0 - 8.5 g/dL - - 6.9  Total Bilirubin 0.0 - 1.2 mg/dL - - 0.4  Alkaline Phos 39 - 117 IU/L - - 82  AST 0 - 40 IU/L - - 22  ALT 0 - 32 IU/L - - 20   Lab Results  Component Value Date   TSH 1.260 06/03/2017     Recent Lipid Panel No results found for: CHOL, TRIG, HDL, CHOLHDL, LDLCALC, LDLDIRECT  Wt Readings from Last 3 Encounters:  11/29/19 190 lb (86.2 kg)  07/31/19 200 lb 9.6 oz (91 kg)  07/03/19 203 lb 9.6 oz (92.4 kg)     Objective:    Vital  Signs:  BP 103/75   Pulse 61   Ht 5' 6.5" (1.689 m)   Wt 190 lb (86.2 kg)   BMI 30.21 kg/m    74 y.o. female in no acute distress.   ASSESSMENT & PLAN:    1.  Palpitations, SVT on monitor -Her symptoms have greatly improved on the beta-blocker.  However, her heart rate is in the 50s at times and she is having some orthostatic dizziness. -Her blood pressure is on the low end of normal -The metoprolol to 12.5 mg twice daily.  If her lightheaded feelings decrease but her palpitations increase, she may have to decide which symptoms she dislikes more.  2.  Hypertension: -Her blood pressures on the low end of normal today. -She is on valsartan 40 mg daily and metoprolol 25 mg twice daily -To improve her blood pressure little bit and keep her heart rate from dropping too low, will decrease the beta-blocker, if her blood pressure ends up needing more control, increase the valsartan  3.  Presyncope -Follow for symptoms on a decreased dose of beta-blocker -If her heart rate remains low when she continues to have feelings of being lightheaded or dizzy, consider repeating her monitor to see if she has chronotropic incompetence or symptomatic bradycardia  COVID-19 Education: The signs and symptoms of COVID-19 were discussed with the patient and how to seek care for testing (follow up with PCP or arrange E-visit).  The importance of social distancing was discussed today.  Patient Risk:   After full review of this patient's clinical status, I feel that they are at least moderate risk at this time.  Time:   Today, I have spent 22 minutes with the patient with telehealth technology discussing cardiology and other health.     Medication Adjustments/Labs and Tests Ordered: Current medicines are reviewed at length with the patient today.  Concerns regarding medicines are outlined above.  Tests Ordered: No orders of the defined types were placed in this encounter.  Medication Changes: No orders  of the defined types were placed in this encounter.   Disposition:  Follow up with Minus Breeding, MD   Signed, Rosaria Ferries, PA-C  11/29/2019 12:11 PM    [facility]

## 2019-11-29 ENCOUNTER — Telehealth (INDEPENDENT_AMBULATORY_CARE_PROVIDER_SITE_OTHER): Payer: Medicare Other | Admitting: Physician Assistant

## 2019-11-29 ENCOUNTER — Telehealth: Payer: Self-pay

## 2019-11-29 ENCOUNTER — Encounter: Payer: Self-pay | Admitting: Physician Assistant

## 2019-11-29 VITALS — BP 103/75 | HR 61 | Ht 66.5 in | Wt 190.0 lb

## 2019-11-29 DIAGNOSIS — R002 Palpitations: Secondary | ICD-10-CM

## 2019-11-29 DIAGNOSIS — I10 Essential (primary) hypertension: Secondary | ICD-10-CM | POA: Diagnosis not present

## 2019-11-29 DIAGNOSIS — R55 Syncope and collapse: Secondary | ICD-10-CM

## 2019-11-29 DIAGNOSIS — I471 Supraventricular tachycardia: Secondary | ICD-10-CM | POA: Diagnosis not present

## 2019-11-29 MED ORDER — METOPROLOL TARTRATE 25 MG PO TABS: 12.5000 mg | ORAL_TABLET | Freq: Two times a day (BID) | ORAL | 3 refills | Status: DC

## 2019-11-29 NOTE — Telephone Encounter (Signed)
  Patient Consent for Virtual Visit         [name] has provided verbal consent on 11/29/2019 for a virtual visit (video or telephone).   CONSENT FOR VIRTUAL VISIT FOR:  Kelsey Diaz  By participating in this virtual visit I agree to the following:  I hereby voluntarily request, consent and authorize [facility] and its employed or contracted physicians, physician assistants, nurse practitioners or other licensed health care professionals (the Practitioner), to provide me with telemedicine health care services (the "Services") as deemed necessary by the treating Practitioner. I acknowledge and consent to receive the Services by the Practitioner via telemedicine. I understand that the telemedicine visit will involve communicating with the Practitioner through live audiovisual communication technology and the disclosure of certain medical information by electronic transmission. I acknowledge that I have been given the opportunity to request an in-person assessment or other available alternative prior to the telemedicine visit and am voluntarily participating in the telemedicine visit.  I understand that I have the right to withhold or withdraw my consent to the use of telemedicine in the course of my care at any time, without affecting my right to future care or treatment, and that the Practitioner or I may terminate the telemedicine visit at any time. I understand that I have the right to inspect all information obtained and/or recorded in the course of the telemedicine visit and may receive copies of available information for a reasonable fee.  I understand that some of the potential risks of receiving the Services via telemedicine include:  Marland Kitchen Delay or interruption in medical evaluation due to technological equipment failure or disruption; . Information transmitted may not be sufficient (e.g. poor resolution of images) to allow for appropriate medical decision making by the  Practitioner; and/or  . In rare instances, security protocols could fail, causing a breach of personal health information.  Furthermore, I acknowledge that it is my responsibility to provide information about my medical history, conditions and care that is complete and accurate to the best of my ability. I acknowledge that Practitioner's advice, recommendations, and/or decision may be based on factors not within their control, such as incomplete or inaccurate data provided by me or distortions of diagnostic images or specimens that may result from electronic transmissions. I understand that the practice of medicine is not an exact science and that Practitioner makes no warranties or guarantees regarding treatment outcomes. I acknowledge that a copy of this consent can be made available to me via my patient portal ([facility]), or I can request a printed copy by calling the office of [facility].    I understand that my insurance will be billed for this visit.   I have read or had this consent read to me. . I understand the contents of this consent, which adequately explains the benefits and risks of the Services being provided via telemedicine.  . I have been provided ample opportunity to ask questions regarding this consent and the Services and have had my questions answered to my satisfaction. . I give my informed consent for the services to be provided through the use of telemedicine in my medical care

## 2019-11-29 NOTE — Patient Instructions (Addendum)
Medication Instructions:   DECREASE Metoprolol Tartrate (Lopressor) to 12.5 mg 2 times a day *If you need a refill on your cardiac medications before your next appointment, please call your pharmacy*  Lab Work: NONE ordered at this time of appointment   If you have labs (blood work) drawn today and your tests are completely normal, you will receive your results only by: Marland Kitchen MyChart Message (if you have MyChart) OR . A paper copy in the mail If you have any lab test that is abnormal or we need to change your treatment, we will call you to review the results.  Testing/Procedures: NONE ordered at this time of appointment   Follow-Up: At [facility], you and your health needs are our priority.  As part of our continuing mission to provide you with exceptional heart care, we have created designated Provider Care Teams.  These Care Teams include your primary Cardiologist (physician) and Advanced Practice Providers (APPs -  Physician Assistants and Nurse Practitioners) who all work together to provide you with the care you need, when you need it.   Your next appointment:   6 month(s)  The format for your next appointment:   In Person  Provider:   Minus Breeding, MD  Other Instructions

## 2019-12-13 ENCOUNTER — Encounter: Payer: Self-pay | Admitting: Pulmonary Disease

## 2019-12-13 ENCOUNTER — Other Ambulatory Visit: Payer: Self-pay

## 2019-12-13 ENCOUNTER — Ambulatory Visit: Payer: Medicare Other | Admitting: Pulmonary Disease

## 2019-12-13 VITALS — BP 134/76 | HR 54 | Temp 96.0°F | Ht 66.5 in | Wt 199.8 lb

## 2019-12-13 DIAGNOSIS — Z23 Encounter for immunization: Secondary | ICD-10-CM

## 2019-12-13 DIAGNOSIS — R0602 Shortness of breath: Secondary | ICD-10-CM

## 2019-12-13 NOTE — Progress Notes (Signed)
Kelsey Diaz    [id number]    12-04-1945  Primary Care Physician:White, Caren Griffins, MD  Referring Physician: Harlan Stains, MD [place],  [place] [id number]  Chief complaint: Follow-up for COPD, asthma, sleep apnea  HPI: 74 year old with history of hypertension, COPD, fibromyalgia, GERD, hypothyroidism, sleep apnea,.  She has previously seen Dr. Halford Chessman at the pulmonary clinic in 2014.  Referred back for management of COPD, asthma She reports worsening dyspnea on exertion or the past 6 months associated with chest tightness, wheezing.  She is just on albuterol inhaler and has been intolerant ofFollow-up Advair, Pulmicort and Spiriva in the past.  She denies any nighttime awakening.  No sputum production, fevers, chills, hemoptysis.   History noted for significant GERD, hiatal hernia, esophageal strictures.  She follows with Dr. Teena Irani, GI heartburn. Reports worsening heartburn, indigestion over the past few months.  She has history of sleep apnea and was previously on CPAP.  She stopped using it many years ago as she was intolerant of the mask and does not want to try again.  Pets: No pets Occupation: Retired Secondary school teacher at [employer] Exposures: No mold, hot tubs.  No other significant exposure. Smoking history: 80-pack-year smoking history.  Quit in 2001 Travel History: Not significant.  Lived in [place] all her life.  Interim history: Currently off inhalers.  States that breathing is doing well  Recently lost her husband to hospice earlier this month and is still grieving.  She has been taken off methotrexate by Dr. Dossie Der, rheumatology and started Enbrel for rheumatoid arthritis.  Outpatient Encounter Medications as of 12/13/2019  Medication Sig  . ACCU-CHEK GUIDE test strip   . Accu-Chek Softclix Lancets lancets   . albuterol (PROAIR HFA) 108 (90 BASE) MCG/ACT inhaler Inhale 2 puffs into the lungs every 6 (six) hours as needed  for shortness of breath.   . Blood Glucose Monitoring Suppl (ACCU-CHEK GUIDE ME) w/Device KIT   . Calcium Carbonate-Vitamin D (CALCIUM + D) 600-200 MG-UNIT TABS Take 1 tablet by mouth daily.   . Clobetasol Prop Emollient Base 0.05 % emollient cream APPLY 1 APPLICATION ONCE A WEEK AS NEEDED EXTERNALLY 90 DAYS  . DULoxetine (CYMBALTA) 60 MG capsule Take 120 mg by mouth daily.   Marland Kitchen etanercept (ENBREL) 50 MG/ML injection Inject 50 mg into the skin once a week.  . fluocinonide (LIDEX) 0.05 % external solution Apply 1 application topically 2 (two) times daily as needed (skin irritation).   . Glucosamine-Chondroitin (MOVE FREE PO) Take by mouth daily.  Derrill Memo ON 11/29/2020] HYDROcodone-acetaminophen (NORCO) 7.5-325 MG tablet Take 1 tablet by mouth every 6 (six) hours as needed for moderate pain or severe pain.  Marland Kitchen LORazepam (ATIVAN) 0.5 MG tablet Take 0.5 mg by mouth daily as needed.  . methocarbamol (ROBAXIN) 500 MG tablet Take 500 mg by mouth daily.  . metoprolol tartrate (LOPRESSOR) 25 MG tablet Take 0.5 tablets (12.5 mg total) by mouth 2 (two) times daily.  . Multiple Vitamin (MULTI-VITAMIN PO) Take 1 tablet by mouth daily.   . Omega-3 Fatty Acids (FISH OIL PO) Take 1,000 mg by mouth daily.   Marland Kitchen omeprazole (PRILOSEC) 40 MG capsule Take 1 capsule by mouth daily.  . OSPHENA 60 MG TABS Take 1 tablet by mouth daily.  . predniSONE (DELTASONE) 10 MG tablet Take 5 mg by mouth daily with breakfast.   . Probiotic Product (PROBIOTIC DAILY PO) Take by mouth.  Marland Kitchen  valsartan (DIOVAN) 40 MG tablet Take 40 mg by mouth daily.   No facility-administered encounter medications on file as of 12/13/2019.   Physical Exam: Blood pressure 134/76, pulse (!) 54, temperature (!) 96 F (35.6 C), temperature source Skin, height 5' 6.5" (1.689 m), weight 199 lb 12.8 oz (90.6 kg), SpO2 97 %. Gen:      No acute distress HEENT:  EOMI, sclera anicteric Neck:     No masses; no thyromegaly Lungs:    Clear to auscultation  bilaterally; normal respiratory effort CV:         Regular rate and rhythm; no murmurs Abd:      + bowel sounds; soft, non-tender; no palpable masses, no distension Ext:    No edema; adequate peripheral perfusion Skin:      Warm and dry; no rash Neuro: alert and oriented x 3 Psych: normal mood and affect  Data Reviewed: PFTs  10/09/11 FVC 3.51 [104%], FEV1 2.20 [89%], F/F 63, TLC 92%, DLCO 52% Mild obstructive airway disease.  Moderate decrease in diffusion capacity.  No bronchodilator response.  05/04/17 FVC 3.20 [101%], FEV1 2.23 [97%], F/F 73, TLC 108%, DLCO 53% Minimal obstructive airway disease, moderate decrease in diffusion capacity.  No bronchodilator response.  Labs CBC 03/25/17-WBC 6.9, eos 3.8%, absolute eosinophil count 300 Allergy profile 03/25/17-IgE 52, RAST panel negative Alpha-1 antitrypsin 03/25/17-193, PIMM  FENO 03/25/17-37 FENO 05/04/17-34  Imaging Chest x-ray 09/13/2017-emphysema, chronic bronchitis.  With no active pulmonary disease. CT abdomen pelvis 12/01/14- visualized lung bases are clear. CT high-resolution 08/31/2019-no interstitial lung disease, mild to moderate emphysema, aortic, coronary atherosclerosis. I have reviewed the images personally.  PFTs: 08/11/2019 FVC 3.02 [97%), FEV1 2.16 [92%], F/F 71, TLC 7.62 [142%], DLCO 15.86 [77%] Minimal obstructive airway disease  Assessment:  COPD, moderate persistent asthma  PFTs reviewed. Although she does not have obstruction by F/F criteria at this curvature to the loop suggestive of minimal obstructive airway disease. Reduction in diffusion capacity noted.  There is no evidence of interstitial lung disease.  Has not tolerated multiple inhalers in the past including Advair, Breo, Trelegy. Currently on just albuterol as needed.  As per symptoms are not severe so okay to observe her off controller medication  Rheumatoid arthritis, on Enbrel No evidence of interstitial lung disease on high-res CT and  PFTs.  GERD Follows with [facility].  Continues on PPI.  Obstructive sleep apnea Noncompliant with CPAP.  Discussed with patient and she is not interested in retrying CPAP therapy.  Health maintenance 03/16/2013-Prevnar 03/01/2017-Pneumovax  Plan/Recommendations: - Albuterol as needed - PFTs in 1 year and follow-up.  Marshell Garfinkel MD [facility] 12/13/2019, 11:20 AM  CC: Harlan Stains, MD

## 2019-12-13 NOTE — Addendum Note (Signed)
Addended by: Elton Sin on: 12/13/2019 12:17 PM   Modules accepted: Orders

## 2019-12-13 NOTE — Patient Instructions (Signed)
I am glad you are stable with regard to breathing Your CT scan and lung function tests show very minimal changes of COPD and now effects of rheumatoid arthritis in the lung Since you are stable we will observe you off regular inhalers  Follow-up in 1 year with pulmonary function test.

## 2020-01-08 ENCOUNTER — Ambulatory Visit: Payer: Medicare Other | Attending: Anesthesiology | Admitting: Anesthesiology

## 2020-01-08 ENCOUNTER — Other Ambulatory Visit: Payer: Self-pay

## 2020-01-08 ENCOUNTER — Encounter: Payer: Self-pay | Admitting: Anesthesiology

## 2020-01-08 DIAGNOSIS — M797 Fibromyalgia: Secondary | ICD-10-CM

## 2020-01-08 DIAGNOSIS — M0579 Rheumatoid arthritis with rheumatoid factor of multiple sites without organ or systems involvement: Secondary | ICD-10-CM | POA: Diagnosis not present

## 2020-01-08 DIAGNOSIS — M5136 Other intervertebral disc degeneration, lumbar region: Secondary | ICD-10-CM | POA: Diagnosis not present

## 2020-01-08 DIAGNOSIS — M25552 Pain in left hip: Secondary | ICD-10-CM

## 2020-01-08 DIAGNOSIS — M4716 Other spondylosis with myelopathy, lumbar region: Secondary | ICD-10-CM | POA: Diagnosis not present

## 2020-01-08 DIAGNOSIS — M5431 Sciatica, right side: Secondary | ICD-10-CM | POA: Diagnosis not present

## 2020-01-08 DIAGNOSIS — M79642 Pain in left hand: Secondary | ICD-10-CM

## 2020-01-08 DIAGNOSIS — G894 Chronic pain syndrome: Secondary | ICD-10-CM

## 2020-01-08 DIAGNOSIS — M47817 Spondylosis without myelopathy or radiculopathy, lumbosacral region: Secondary | ICD-10-CM

## 2020-01-08 DIAGNOSIS — M25551 Pain in right hip: Secondary | ICD-10-CM

## 2020-01-08 DIAGNOSIS — F1190 Opioid use, unspecified, uncomplicated: Secondary | ICD-10-CM

## 2020-01-08 DIAGNOSIS — M5432 Sciatica, left side: Secondary | ICD-10-CM

## 2020-01-08 DIAGNOSIS — M79641 Pain in right hand: Secondary | ICD-10-CM

## 2020-01-08 MED ORDER — HYDROCODONE-ACETAMINOPHEN 7.5-325 MG PO TABS: 1.0000 | ORAL_TABLET | Freq: Four times a day (QID) | ORAL | 0 refills | Status: DC | PRN

## 2020-01-08 MED ORDER — HYDROCODONE-ACETAMINOPHEN 7.5-325 MG PO TABS: 1.0000 | ORAL_TABLET | Freq: Four times a day (QID) | ORAL | 0 refills | Status: AC | PRN

## 2020-01-10 NOTE — Progress Notes (Signed)
Virtual Visit via Telephone Note  I connected with Kelsey Diaz on 01/10/20 at  2:00 PM EST by telephone and verified that I am speaking with the correct person using two identifiers.  Location: Patient: Home Provider: Pain control center   I discussed the limitations, risks, security and privacy concerns of performing an evaluation and management service by telephone and the availability of in person appointments. I also discussed with the patient that there may be a patient responsible charge related to this service. The patient expressed understanding and agreed to proceed.   History of Present Illness: I spoke with Kelsey Diaz regarding her low back pain and lower extremity pain.  She has had some recent recurrence of some sciatica symptoms consistent with what she has experienced in the past.  Despite efforts at stretching strengthening and medication management these have been persistent and more severe as of late.  In the past she has responded favorably to epidural steroid injections.  She reports that the medications give her good relief from her symptoms however she would like to get an at the next available date for a repeat epidural.  Otherwise she reports that no changes in lower extremity strength or function or bowel or bladder function are noted.   Observations/Objective:  Current Outpatient Medications:    ACCU-CHEK GUIDE test strip, , Disp: , Rfl:    Accu-Chek Softclix Lancets lancets, , Disp: , Rfl:    albuterol (PROAIR HFA) 108 (90 BASE) MCG/ACT inhaler, Inhale 2 puffs into the lungs every 6 (six) hours as needed for shortness of breath. , Disp: , Rfl:    Blood Glucose Monitoring Suppl (ACCU-CHEK GUIDE ME) w/Device KIT, , Disp: , Rfl:    Calcium Carbonate-Vitamin D (CALCIUM + D) 600-200 MG-UNIT TABS, Take 1 tablet by mouth daily. , Disp: , Rfl:    Clobetasol Prop Emollient Base 0.05 % emollient cream, APPLY 1 APPLICATION ONCE A WEEK AS NEEDED EXTERNALLY 90 DAYS,  Disp: , Rfl:    DULoxetine (CYMBALTA) 60 MG capsule, Take 120 mg by mouth daily. , Disp: , Rfl:    etanercept (ENBREL) 50 MG/ML injection, Inject 50 mg into the skin once a week., Disp: , Rfl:    fluocinonide (LIDEX) 0.05 % external solution, Apply 1 application topically 2 (two) times daily as needed (skin irritation). , Disp: , Rfl:    Glucosamine-Chondroitin (MOVE FREE PO), Take by mouth daily., Disp: , Rfl:    [START ON 01/17/2021] HYDROcodone-acetaminophen (NORCO) 7.5-325 MG tablet, Take 1 tablet by mouth every 6 (six) hours as needed for moderate pain or severe pain., Disp: 120 tablet, Rfl: 0   [START ON 02/16/2020] HYDROcodone-acetaminophen (NORCO) 7.5-325 MG tablet, Take 1 tablet by mouth every 6 (six) hours as needed for moderate pain or severe pain., Disp: 120 tablet, Rfl: 0   LORazepam (ATIVAN) 0.5 MG tablet, Take 0.5 mg by mouth daily as needed., Disp: , Rfl:    methocarbamol (ROBAXIN) 500 MG tablet, Take 500 mg by mouth daily., Disp: , Rfl:    metoprolol tartrate (LOPRESSOR) 25 MG tablet, Take 0.5 tablets (12.5 mg total) by mouth 2 (two) times daily., Disp: 90 tablet, Rfl: 3   Multiple Vitamin (MULTI-VITAMIN PO), Take 1 tablet by mouth daily. , Disp: , Rfl:    Omega-3 Fatty Acids (FISH OIL PO), Take 1,000 mg by mouth daily. , Disp: , Rfl:    omeprazole (PRILOSEC) 40 MG capsule, Take 1 capsule by mouth daily., Disp: , Rfl: 1   OSPHENA 60 MG  TABS, Take 1 tablet by mouth daily., Disp: , Rfl:    predniSONE (DELTASONE) 10 MG tablet, Take 5 mg by mouth daily with breakfast. , Disp: , Rfl:    Probiotic Product (PROBIOTIC DAILY PO), Take by mouth., Disp: , Rfl:    valsartan (DIOVAN) 40 MG tablet, Take 40 mg by mouth daily., Disp: , Rfl:   Assessment and Plan: 1. DDD (degenerative disc disease), lumbar   2. Bilateral sciatica   3. Lumbar spondylosis with myelopathy   4. Rheumatoid arthritis involving multiple sites with positive rheumatoid factor ([place])   5. Chronic pain  syndrome   6. Chronic, continuous use of opioids   7. Fibromyalgia   8. Bilateral hip pain   9. Pain in both hands   10. Facet arthritis of lumbosacral region   Based on our discussion today and upon review of the [facility] practitioner database information I think it is appropriate to refill her medications.  This will be for the next 2 months.  I will schedule her for return to clinic in 2 months with an epidural steroid injection at that time.  She has responded favorably to these in the past and they have enabled her medication management to be more effective as well.  I encouraged her also to continue with efforts at stretching strengthening for her core function.  No other changes will be initiated today.  Follow-up will be in 2 months as mentioned and continue follow-up with her primary care physicians for baseline medical care.  Follow Up Instructions:    I discussed the assessment and treatment plan with the patient. The patient was provided an opportunity to ask questions and all were answered. The patient agreed with the plan and demonstrated an understanding of the instructions.   The patient was advised to call back or seek an in-person evaluation if the symptoms worsen or if the condition fails to improve as anticipated.  I provided 30 minutes of non-face-to-face time during this encounter.   Molli Barrows, MD

## 2020-02-28 DIAGNOSIS — M47816 Spondylosis without myelopathy or radiculopathy, lumbar region: Secondary | ICD-10-CM | POA: Diagnosis not present

## 2020-02-28 DIAGNOSIS — F324 Major depressive disorder, single episode, in partial remission: Secondary | ICD-10-CM | POA: Diagnosis not present

## 2020-02-28 DIAGNOSIS — I10 Essential (primary) hypertension: Secondary | ICD-10-CM | POA: Diagnosis not present

## 2020-02-28 DIAGNOSIS — K219 Gastro-esophageal reflux disease without esophagitis: Secondary | ICD-10-CM | POA: Diagnosis not present

## 2020-02-28 DIAGNOSIS — J449 Chronic obstructive pulmonary disease, unspecified: Secondary | ICD-10-CM | POA: Diagnosis not present

## 2020-02-28 DIAGNOSIS — M059 Rheumatoid arthritis with rheumatoid factor, unspecified: Secondary | ICD-10-CM | POA: Diagnosis not present

## 2020-02-28 DIAGNOSIS — E1169 Type 2 diabetes mellitus with other specified complication: Secondary | ICD-10-CM | POA: Diagnosis not present

## 2020-03-12 ENCOUNTER — Encounter: Payer: Self-pay | Admitting: Anesthesiology

## 2020-03-12 ENCOUNTER — Ambulatory Visit: Payer: Medicare HMO | Attending: Anesthesiology | Admitting: Anesthesiology

## 2020-03-12 ENCOUNTER — Other Ambulatory Visit: Payer: Self-pay

## 2020-03-12 DIAGNOSIS — M5431 Sciatica, right side: Secondary | ICD-10-CM

## 2020-03-12 DIAGNOSIS — M797 Fibromyalgia: Secondary | ICD-10-CM | POA: Diagnosis not present

## 2020-03-12 DIAGNOSIS — M5136 Other intervertebral disc degeneration, lumbar region: Secondary | ICD-10-CM | POA: Diagnosis not present

## 2020-03-12 DIAGNOSIS — M5432 Sciatica, left side: Secondary | ICD-10-CM

## 2020-03-12 DIAGNOSIS — M25552 Pain in left hip: Secondary | ICD-10-CM

## 2020-03-12 DIAGNOSIS — G894 Chronic pain syndrome: Secondary | ICD-10-CM | POA: Diagnosis not present

## 2020-03-12 DIAGNOSIS — F1190 Opioid use, unspecified, uncomplicated: Secondary | ICD-10-CM | POA: Diagnosis not present

## 2020-03-12 DIAGNOSIS — M25551 Pain in right hip: Secondary | ICD-10-CM | POA: Diagnosis not present

## 2020-03-12 DIAGNOSIS — M4716 Other spondylosis with myelopathy, lumbar region: Secondary | ICD-10-CM

## 2020-03-12 DIAGNOSIS — M0579 Rheumatoid arthritis with rheumatoid factor of multiple sites without organ or systems involvement: Secondary | ICD-10-CM | POA: Diagnosis not present

## 2020-03-12 MED ORDER — METHOCARBAMOL 500 MG PO TABS: 500.0000 mg | ORAL_TABLET | Freq: Two times a day (BID) | ORAL | 5 refills | Status: AC | PRN

## 2020-03-12 MED ORDER — HYDROCODONE-ACETAMINOPHEN 7.5-325 MG PO TABS: 1.0000 | ORAL_TABLET | Freq: Four times a day (QID) | ORAL | 0 refills | Status: DC | PRN

## 2020-03-12 NOTE — Progress Notes (Signed)
Virtual Visit via Telephone Note  I connected with Kelsey Diaz on 03/12/20 at  1:55 PM EST by telephone and verified that I am speaking with the correct person using two identifiers.  Location: Patient: Home Provider: Pain control center   I discussed the limitations, risks, security and privacy concerns of performing an evaluation and management service by telephone and the availability of in person appointments. I also discussed with the patient that there may be a patient responsible charge related to this service. The patient expressed understanding and agreed to proceed.   History of Present Illness:   I spoke with Kelsey Diaz today via telephone as she was unable to do the video portion of the virtual conference.  She reports that she has had an increase in her lower extremity pain hip pain and calf cramping.  Her last epidural was back in 2020 and she got excellent relief from this for her low back pain and calf cramping.  She denies any change in strength or bowel or bladder function.  She continues to take her medications as prescribed generally utilizing Vicodin 7.5 mg up to 3 or 4 times a day for breakthrough pain and rheumatoid arthritis pain.  She is also run out of her Robaxin.  Otherwise the quality characteristic and distribution of her pain of been stable in nature with no significant changes reported. Observations/Objective:  Current Outpatient Medications:  .  [START ON 04/11/2020] HYDROcodone-acetaminophen (NORCO) 7.5-325 MG tablet, Take 1 tablet by mouth every 6 (six) hours as needed for moderate pain or severe pain., Disp: 120 tablet, Rfl: 0 .  ACCU-CHEK GUIDE test strip, , Disp: , Rfl:  .  Accu-Chek Softclix Lancets lancets, , Disp: , Rfl:  .  albuterol (PROAIR HFA) 108 (90 BASE) MCG/ACT inhaler, Inhale 2 puffs into the lungs every 6 (six) hours as needed for shortness of breath. , Disp: , Rfl:  .  Blood Glucose Monitoring Suppl (ACCU-CHEK GUIDE ME) w/Device KIT, ,  Disp: , Rfl:  .  Calcium Carbonate-Vitamin D (CALCIUM + D) 600-200 MG-UNIT TABS, Take 1 tablet by mouth daily. , Disp: , Rfl:  .  Clobetasol Prop Emollient Base 0.05 % emollient cream, APPLY 1 APPLICATION ONCE A WEEK AS NEEDED EXTERNALLY 90 DAYS, Disp: , Rfl:  .  DULoxetine (CYMBALTA) 60 MG capsule, Take 120 mg by mouth daily. , Disp: , Rfl:  .  etanercept (ENBREL) 50 MG/ML injection, Inject 50 mg into the skin once a week., Disp: , Rfl:  .  fluocinonide (LIDEX) 0.05 % external solution, Apply 1 application topically 2 (two) times daily as needed (skin irritation). , Disp: , Rfl:  .  Glucosamine-Chondroitin (MOVE FREE PO), Take by mouth daily., Disp: , Rfl:  .  HYDROcodone-acetaminophen (NORCO) 7.5-325 MG tablet, Take 1 tablet by mouth every 6 (six) hours as needed for moderate pain or severe pain., Disp: 120 tablet, Rfl: 0 .  [START ON 03/12/2021] HYDROcodone-acetaminophen (NORCO) 7.5-325 MG tablet, Take 1 tablet by mouth every 6 (six) hours as needed for moderate pain or severe pain., Disp: 120 tablet, Rfl: 0 .  LORazepam (ATIVAN) 0.5 MG tablet, Take 0.5 mg by mouth daily as needed., Disp: , Rfl:  .  methocarbamol (ROBAXIN) 500 MG tablet, Take 1 tablet (500 mg total) by mouth 2 (two) times daily as needed for muscle spasms., Disp: 60 tablet, Rfl: 5 .  metoprolol tartrate (LOPRESSOR) 25 MG tablet, Take 0.5 tablets (12.5 mg total) by mouth 2 (two) times daily., Disp: 90 tablet,  Rfl: 3 .  Multiple Vitamin (MULTI-VITAMIN PO), Take 1 tablet by mouth daily. , Disp: , Rfl:  .  Omega-3 Fatty Acids (FISH OIL PO), Take 1,000 mg by mouth daily. , Disp: , Rfl:  .  omeprazole (PRILOSEC) 40 MG capsule, Take 1 capsule by mouth daily., Disp: , Rfl: 1 .  OSPHENA 60 MG TABS, Take 1 tablet by mouth daily., Disp: , Rfl:  .  predniSONE (DELTASONE) 10 MG tablet, Take 5 mg by mouth daily with breakfast. , Disp: , Rfl:  .  Probiotic Product (PROBIOTIC DAILY PO), Take by mouth., Disp: , Rfl:  .  valsartan (DIOVAN) 40 MG  tablet, Take 40 mg by mouth daily., Disp: , Rfl:   Assessment and Plan: 1. DDD (degenerative disc disease), lumbar   2. Bilateral sciatica   3. Lumbar spondylosis with myelopathy   4. Rheumatoid arthritis involving multiple sites with positive rheumatoid factor ([place])   5. Chronic pain syndrome   6. Chronic, continuous use of opioids   7. Fibromyalgia   8. Bilateral hip pain   Based on our discussion today upon review of the [facility] practitioner database information going to refill her medications for the next 2 months dated 4 January 7 in February 6.  I am also going to schedule her for 3-week return to the pain clinic for a lumbar epidural steroid injection.  She has responded favorably to this in the past.  I encouraged her to continue with her stretching strengthening exercises as tolerated and we will refill her Robaxin for twice daily as needed dosing.  She is to continue follow-up with her primary care physicians for baseline medical care as well.  Follow Up Instructions:    I discussed the assessment and treatment plan with the patient. The patient was provided an opportunity to ask questions and all were answered. The patient agreed with the plan and demonstrated an understanding of the instructions.   The patient was advised to call back or seek an in-person evaluation if the symptoms worsen or if the condition fails to improve as anticipated.  I provided 30 minutes of non-face-to-face time during this encounter.   Molli Barrows, MD

## 2020-04-03 ENCOUNTER — Other Ambulatory Visit: Payer: Self-pay

## 2020-04-03 ENCOUNTER — Encounter: Payer: Self-pay | Admitting: Anesthesiology

## 2020-04-03 ENCOUNTER — Ambulatory Visit
Admission: RE | Admit: 2020-04-03 | Discharge: 2020-04-03 | Disposition: A | Discharge status: Home / Self Care | Payer: Medicare HMO | Source: Ambulatory Visit | Attending: Anesthesiology | Admitting: Anesthesiology

## 2020-04-03 ENCOUNTER — Other Ambulatory Visit: Payer: Self-pay | Admitting: Anesthesiology

## 2020-04-03 ENCOUNTER — Ambulatory Visit (HOSPITAL_BASED_OUTPATIENT_CLINIC_OR_DEPARTMENT_OTHER): Payer: Medicare HMO | Admitting: Anesthesiology

## 2020-04-03 DIAGNOSIS — M25552 Pain in left hip: Secondary | ICD-10-CM | POA: Insufficient documentation

## 2020-04-03 DIAGNOSIS — M25551 Pain in right hip: Secondary | ICD-10-CM

## 2020-04-03 DIAGNOSIS — R52 Pain, unspecified: Secondary | ICD-10-CM

## 2020-04-03 DIAGNOSIS — M5136 Other intervertebral disc degeneration, lumbar region: Secondary | ICD-10-CM

## 2020-04-03 DIAGNOSIS — Z01812 Encounter for preprocedural laboratory examination: Secondary | ICD-10-CM | POA: Diagnosis not present

## 2020-04-03 DIAGNOSIS — M5432 Sciatica, left side: Secondary | ICD-10-CM | POA: Diagnosis not present

## 2020-04-03 DIAGNOSIS — M5431 Sciatica, right side: Secondary | ICD-10-CM | POA: Diagnosis not present

## 2020-04-03 MED ORDER — TRIAMCINOLONE ACETONIDE 40 MG/ML IJ SUSP
INTRAMUSCULAR | Status: AC
  Filled 2020-04-03: qty 1

## 2020-04-03 MED ORDER — ROPIVACAINE HCL 2 MG/ML IJ SOLN
INTRAMUSCULAR | Status: AC
  Filled 2020-04-03: qty 10

## 2020-04-03 MED ORDER — SODIUM CHLORIDE (PF) 0.9 % IJ SOLN
INTRAMUSCULAR | Status: AC
  Filled 2020-04-03: qty 10

## 2020-04-03 MED ORDER — LIDOCAINE HCL (PF) 1 % IJ SOLN
INTRAMUSCULAR | Status: AC
  Filled 2020-04-03: qty 5

## 2020-04-03 MED ORDER — IOHEXOL 180 MG/ML  SOLN
INTRAMUSCULAR | Status: AC
  Filled 2020-04-03: qty 20

## 2020-04-03 NOTE — Patient Instructions (Signed)

## 2020-04-03 NOTE — Progress Notes (Signed)
Safety precautions to be maintained throughout the outpatient stay will include: orient to surroundings, keep bed in low position, maintain call bell within reach at all times, provide assistance with transfer out of bed and ambulation.  

## 2020-04-04 ENCOUNTER — Encounter: Payer: Self-pay | Admitting: Anesthesiology

## 2020-04-04 ENCOUNTER — Telehealth: Payer: Self-pay | Admitting: *Deleted

## 2020-04-04 DIAGNOSIS — M25552 Pain in left hip: Secondary | ICD-10-CM | POA: Diagnosis not present

## 2020-04-04 DIAGNOSIS — M5136 Other intervertebral disc degeneration, lumbar region: Secondary | ICD-10-CM | POA: Diagnosis not present

## 2020-04-04 DIAGNOSIS — R52 Pain, unspecified: Secondary | ICD-10-CM | POA: Diagnosis not present

## 2020-04-04 DIAGNOSIS — M5431 Sciatica, right side: Secondary | ICD-10-CM | POA: Diagnosis not present

## 2020-04-04 DIAGNOSIS — M5432 Sciatica, left side: Secondary | ICD-10-CM | POA: Diagnosis not present

## 2020-04-04 DIAGNOSIS — M25551 Pain in right hip: Secondary | ICD-10-CM | POA: Diagnosis not present

## 2020-04-04 MED ORDER — TRIAMCINOLONE ACETONIDE 40 MG/ML IJ SUSP
40.0000 mg | Freq: Once | INTRAMUSCULAR | Status: AC
  Administered 2020-04-04: 40 mg

## 2020-04-04 MED ORDER — IOHEXOL 180 MG/ML  SOLN
10.0000 mL | Freq: Once | INTRAMUSCULAR | Status: AC | PRN
  Administered 2020-04-03: 10 mL via EPIDURAL

## 2020-04-04 MED ORDER — LIDOCAINE HCL (PF) 1 % IJ SOLN
5.0000 mL | Freq: Once | INTRAMUSCULAR | Status: AC
  Administered 2020-04-03: 5 mL via SUBCUTANEOUS

## 2020-04-04 MED ORDER — ROPIVACAINE HCL 2 MG/ML IJ SOLN
10.0000 mL | Freq: Once | INTRAMUSCULAR | Status: AC
  Administered 2020-04-04: 10 mL via EPIDURAL

## 2020-04-04 MED ORDER — SODIUM CHLORIDE 0.9% FLUSH
10.0000 mL | Freq: Once | INTRAVENOUS | Status: AC
  Administered 2020-04-04: 10 mL

## 2020-04-04 NOTE — Telephone Encounter (Signed)
Spoke with patient re; procedure on yesterday.  No questions or concerns.  

## 2020-04-04 NOTE — Addendum Note (Signed)
Addended by: Molli Barrows on: 04/04/2020 10:05 AM   Modules accepted: Orders

## 2020-04-04 NOTE — Progress Notes (Signed)
Subjective:  Patient ID: Kelsey Diaz, female    DOB: [birth year]  Age: 75 y.o. MRN: [id number]  CC: Back Pain (lower)   Procedure: L5-S1 epidural steroid under fluoroscopic guidance without sedation  HPI Kelsey Diaz presents for reevaluation.  Kelsey Diaz is been having considerable amount of low back pain with radiation into the left greater than right lower extremity.  In the past she has had epidural steroids for this and these have worked well for her.  She has been taking her medications but despite this she continues to have breakthrough pain.  No changes to bowel or bladder function are noted but she is continue to have sciatica symptoms radiating into the left greater than the right leg and some hip and buttock pain.  She had approximately 70% relief with her last epidural injection done in 2020.  Otherwise she is in her usual state of health at this point.  Outpatient Medications Prior to Visit  Medication Sig Dispense Refill  . ACCU-CHEK GUIDE test strip     . Accu-Chek Softclix Lancets lancets     . albuterol (VENTOLIN HFA) 108 (90 Base) MCG/ACT inhaler Inhale 2 puffs into the lungs every 6 (six) hours as needed for shortness of breath.     . Blood Glucose Monitoring Suppl (ACCU-CHEK GUIDE ME) w/Device KIT     . Calcium Carbonate-Vitamin D 600-200 MG-UNIT TABS Take 1 tablet by mouth daily.     . Clobetasol Prop Emollient Base 0.05 % emollient cream APPLY 1 APPLICATION ONCE A WEEK AS NEEDED EXTERNALLY 90 DAYS    . DULoxetine (CYMBALTA) 60 MG capsule Take 120 mg by mouth daily.     Marland Kitchen etanercept (ENBREL) 50 MG/ML injection Inject 50 mg into the skin once a week.    . fluocinonide (LIDEX) 0.05 % external solution Apply 1 application topically 2 (two) times daily as needed (skin irritation).     . Glucosamine-Chondroitin (MOVE FREE PO) Take by mouth daily.    Derrill Memo ON 03/12/2021] HYDROcodone-acetaminophen (NORCO) 7.5-325 MG tablet Take 1 tablet by mouth every 6 (six) hours as  needed for moderate pain or severe pain. 120 tablet 0  . [START ON 04/11/2020] HYDROcodone-acetaminophen (NORCO) 7.5-325 MG tablet Take 1 tablet by mouth every 6 (six) hours as needed for moderate pain or severe pain. 120 tablet 0  . methocarbamol (ROBAXIN) 500 MG tablet Take 1 tablet (500 mg total) by mouth 2 (two) times daily as needed for muscle spasms. 60 tablet 5  . methotrexate 2.5 MG tablet 8 tabs    . metoprolol tartrate (LOPRESSOR) 25 MG tablet Take 0.5 tablets (12.5 mg total) by mouth 2 (two) times daily. 90 tablet 3  . Multiple Vitamin (MULTI-VITAMIN PO) Take 1 tablet by mouth daily.     . Omega-3 Fatty Acids (FISH OIL PO) Take 1,000 mg by mouth daily.     Marland Kitchen omeprazole (PRILOSEC) 40 MG capsule Take 1 capsule by mouth daily.  1  . OSPHENA 60 MG TABS Take 1 tablet by mouth daily.    . predniSONE (DELTASONE) 10 MG tablet Take 5 mg by mouth daily with breakfast.     . valsartan (DIOVAN) 40 MG tablet Take 80 mg by mouth daily.    Marland Kitchen LORazepam (ATIVAN) 0.5 MG tablet Take 0.5 mg by mouth daily as needed. (Patient not taking: Reported on 04/03/2020)    . meloxicam (MOBIC) 15 MG tablet 1 tablet    . Probiotic Product (PROBIOTIC DAILY PO) Take by mouth. (Patient  not taking: Reported on 04/03/2020)    . rosuvastatin (CRESTOR) 20 MG tablet Take 20 mg by mouth once a week.     No facility-administered medications prior to visit.    Review of Systems CNS: No confusion or sedation Cardiac: No angina or palpitations GI: No abdominal pain or constipation Constitutional: No nausea vomiting fevers or chills  Objective:  BP (!) 138/97   Pulse 82   Temp (!) 96.9 F (36.1 C) (Temporal)   Resp 18   Ht 5\' 7"  (1.702 m)   Wt 200 lb (90.7 kg)   SpO2 95%   BMI 31.32 kg/m    BP Readings from Last 3 Encounters:  04/03/20 (!) 138/97  12/13/19 134/76  11/29/19 103/75     Wt Readings from Last 3 Encounters:  04/03/20 200 lb (90.7 kg)  12/13/19 199 lb 12.8 oz (90.6 kg)  11/29/19 190 lb (86.2 kg)      Physical Exam Pt is alert and oriented PERRL EOMI HEART IS RRR no murmur or rub LCTA no wheezing or rales MUSCULOSKELETAL reveals some paraspinous muscle tenderness but no overt trigger points.  She has mild difficulty going from seated to standing and an antalgic gait while ambulating.  Her muscle tone and bulk is at baseline.  She has a positive straight leg raise on the left side negative on the right.  Labs  No results found for: HGBA1C Lab Results  Component Value Date   CREATININE 0.72 09/13/2017    -------------------------------------------------------------------------------------------------------------------- Lab Results  Component Value Date   WBC 9.8 09/08/2017   HGB 11.8 (L) 09/08/2017   HCT 35.3 (L) 09/08/2017   PLT 189 09/08/2017   GLUCOSE 98 09/13/2017   ALT 20 06/03/2017   AST 22 06/03/2017   NA 138 09/13/2017   K 3.4 (L) 09/13/2017   CL 100 09/13/2017   CREATININE 0.72 09/13/2017   BUN 11 09/13/2017   CO2 30 09/13/2017   TSH 1.260 06/03/2017   INR 1.0 06/03/2017    --------------------------------------------------------------------------------------------------------------------- DG PAIN CLINIC C-ARM 1-60 MIN NO REPORT  Result Date: 04/03/2020 Fluoro was used, but no Radiologist interpretation will be provided. Please refer to "NOTES" tab for provider progress note.    Assessment & Plan:   Kelsey Diaz was seen today for back pain.  Diagnoses and all orders for this visit:  DDD (degenerative disc disease), lumbar -     Lumbar Epidural Injection  Bilateral sciatica -     Lumbar Epidural Injection  Bilateral hip pain -     Lumbar Epidural Injection  Other orders -     triamcinolone acetonide (KENALOG-40) injection 40 mg -     sodium chloride flush (NS) 0.9 % injection 10 mL -     ropivacaine (PF) 2 mg/mL (0.2%) (NAROPIN) injection 10 mL -     lidocaine (PF) (XYLOCAINE) 1 % injection 5 mL -     iohexol (OMNIPAQUE) 180 MG/ML injection 10  mL        ----------------------------------------------------------------------------------------------------------------------  Problem List Items Addressed This Visit      Unprioritized   Bilateral hip pain    Other Visit Diagnoses    DDD (degenerative disc disease), lumbar       Relevant Medications   methotrexate 2.5 MG tablet   meloxicam (MOBIC) 15 MG tablet   triamcinolone acetonide (KENALOG-40) injection 40 mg   Bilateral sciatica            ----------------------------------------------------------------------------------------------------------------------  1. DDD (degenerative disc disease), lumbar She has requested a  repeat series of epidural steroid injections to help with her back pain that has been more difficult to control recently.  She has responded favorably to these in the past and we will initiate this today.  The risks and benefits of been reviewed and all questions answered.  I am having her return to clinic in approximately 1 month for possible repeat injection at that time.  I encouraged her to continue with efforts at stretching strengthening and weight loss with ambulation.  No other changes are made and I will have her continue with her current medication management as well. - Lumbar Epidural Injection  2. Bilateral sciatica As above - Lumbar Epidural Injection  3. Bilateral hip pain As above - Lumbar Epidural Injection    ----------------------------------------------------------------------------------------------------------------------  I am having Kelsey Diaz. Favor maintain her Calcium Carbonate-Vitamin D, Multiple Vitamin (MULTI-VITAMIN PO), DULoxetine, albuterol, Omega-3 Fatty Acids (FISH OIL PO), fluocinonide, omeprazole, predniSONE, Glucosamine-Chondroitin (MOVE FREE PO), Probiotic Product (PROBIOTIC DAILY PO), Clobetasol Prop Emollient Base, Osphena, valsartan, Accu-Chek Guide Me, Accu-Chek Guide, Accu-Chek Softclix Lancets, LORazepam,  metoprolol tartrate, Enbrel, HYDROcodone-acetaminophen, HYDROcodone-acetaminophen, methocarbamol, methotrexate, meloxicam, and rosuvastatin.   Meds ordered this encounter  Medications  . triamcinolone acetonide (KENALOG-40) injection 40 mg  . sodium chloride flush (NS) 0.9 % injection 10 mL  . ropivacaine (PF) 2 mg/mL (0.2%) (NAROPIN) injection 10 mL  . lidocaine (PF) (XYLOCAINE) 1 % injection 5 mL  . iohexol (OMNIPAQUE) 180 MG/ML injection 10 mL   Patient's Medications  New Prescriptions   No medications on file  Previous Medications   ACCU-CHEK GUIDE TEST STRIP       ACCU-CHEK SOFTCLIX LANCETS LANCETS       ALBUTEROL (VENTOLIN HFA) 108 (90 BASE) MCG/ACT INHALER    Inhale 2 puffs into the lungs every 6 (six) hours as needed for shortness of breath.    BLOOD GLUCOSE MONITORING SUPPL (ACCU-CHEK GUIDE ME) W/DEVICE KIT       CALCIUM CARBONATE-VITAMIN D 600-200 MG-UNIT TABS    Take 1 tablet by mouth daily.    CLOBETASOL PROP EMOLLIENT BASE 0.05 % EMOLLIENT CREAM    APPLY 1 APPLICATION ONCE A WEEK AS NEEDED EXTERNALLY 90 DAYS   DULOXETINE (CYMBALTA) 60 MG CAPSULE    Take 120 mg by mouth daily.    ETANERCEPT (ENBREL) 50 MG/ML INJECTION    Inject 50 mg into the skin once a week.   FLUOCINONIDE (LIDEX) 0.05 % EXTERNAL SOLUTION    Apply 1 application topically 2 (two) times daily as needed (skin irritation).    GLUCOSAMINE-CHONDROITIN (MOVE FREE PO)    Take by mouth daily.   HYDROCODONE-ACETAMINOPHEN (NORCO) 7.5-325 MG TABLET    Take 1 tablet by mouth every 6 (six) hours as needed for moderate pain or severe pain.   HYDROCODONE-ACETAMINOPHEN (NORCO) 7.5-325 MG TABLET    Take 1 tablet by mouth every 6 (six) hours as needed for moderate pain or severe pain.   LORAZEPAM (ATIVAN) 0.5 MG TABLET    Take 0.5 mg by mouth daily as needed.   MELOXICAM (MOBIC) 15 MG TABLET    1 tablet   METHOCARBAMOL (ROBAXIN) 500 MG TABLET    Take 1 tablet (500 mg total) by mouth 2 (two) times daily as needed for muscle  spasms.   METHOTREXATE 2.5 MG TABLET    8 tabs   METOPROLOL TARTRATE (LOPRESSOR) 25 MG TABLET    Take 0.5 tablets (12.5 mg total) by mouth 2 (two) times daily.   MULTIPLE VITAMIN (MULTI-VITAMIN PO)  Take 1 tablet by mouth daily.    OMEGA-3 FATTY ACIDS (FISH OIL PO)    Take 1,000 mg by mouth daily.    OMEPRAZOLE (PRILOSEC) 40 MG CAPSULE    Take 1 capsule by mouth daily.   OSPHENA 60 MG TABS    Take 1 tablet by mouth daily.   PREDNISONE (DELTASONE) 10 MG TABLET    Take 5 mg by mouth daily with breakfast.    PROBIOTIC PRODUCT (PROBIOTIC DAILY PO)    Take by mouth.   ROSUVASTATIN (CRESTOR) 20 MG TABLET    Take 20 mg by mouth once a week.   VALSARTAN (DIOVAN) 40 MG TABLET    Take 80 mg by mouth daily.  Modified Medications   No medications on file  Discontinued Medications   No medications on file   ----------------------------------------------------------------------------------------------------------------------  Follow-up: Return in about 1 month (around 05/01/2020) for procedure.   Procedure: L5-S1 LESI with fluoroscopic guidance and no moderate sedation  NOTE: The risks, benefits, and expectations of the procedure have been discussed and explained to the patient who was understanding and in agreement with suggested treatment plan. No guarantees were made.  DESCRIPTION OF PROCEDURE: Lumbar epidural steroid injection with no IV Versed, EKG, blood pressure, pulse, and pulse oximetry monitoring. The procedure was performed with the patient in the prone position under fluoroscopic guidance.  Sterile prep x3 was initiated and I then injected subcutaneous lidocaine to the overlying L5-S1 site after its fluoroscopic identifictation.  Using strict aseptic technique, I then advanced an 18-gauge Tuohy epidural needle in the midline using interlaminar approach via loss-of-resistance to saline technique. There was negative aspiration for heme or  CSF.  I then confirmed position with both AP and Lateral  fluoroscan.  2 cc of contrast dye were injected and a  total of 5 mL of Preservative-Free normal saline mixed with 40 mg of Kenalog and 1cc Ropicaine 0.2 percent were injected incrementally via the  epidurally placed needle. The needle was removed. The patient tolerated the injection well and was convalesced and discharged to home in stable condition. Should the patient have any post procedure difficulty they have been instructed on how to contact us for assistance.    Molli Barrows, MD

## 2020-04-05 MED FILL — Ketamine HCl-NaCl Soln Pref Sy 100 MG/10ML-0.9% (10MG/ML): INTRAVENOUS | Qty: 10 | Status: CN

## 2020-04-08 DIAGNOSIS — R194 Change in bowel habit: Secondary | ICD-10-CM | POA: Diagnosis not present

## 2020-04-08 DIAGNOSIS — D123 Benign neoplasm of transverse colon: Secondary | ICD-10-CM | POA: Diagnosis not present

## 2020-04-08 DIAGNOSIS — K5730 Diverticulosis of large intestine without perforation or abscess without bleeding: Secondary | ICD-10-CM | POA: Diagnosis not present

## 2020-04-08 DIAGNOSIS — K648 Other hemorrhoids: Secondary | ICD-10-CM | POA: Diagnosis not present

## 2020-04-10 DIAGNOSIS — D123 Benign neoplasm of transverse colon: Secondary | ICD-10-CM | POA: Diagnosis not present

## 2020-04-11 DIAGNOSIS — E1169 Type 2 diabetes mellitus with other specified complication: Secondary | ICD-10-CM | POA: Diagnosis not present

## 2020-04-11 DIAGNOSIS — M059 Rheumatoid arthritis with rheumatoid factor, unspecified: Secondary | ICD-10-CM | POA: Diagnosis not present

## 2020-04-11 DIAGNOSIS — K219 Gastro-esophageal reflux disease without esophagitis: Secondary | ICD-10-CM | POA: Diagnosis not present

## 2020-04-11 DIAGNOSIS — J449 Chronic obstructive pulmonary disease, unspecified: Secondary | ICD-10-CM | POA: Diagnosis not present

## 2020-04-11 DIAGNOSIS — M47816 Spondylosis without myelopathy or radiculopathy, lumbar region: Secondary | ICD-10-CM | POA: Diagnosis not present

## 2020-04-11 DIAGNOSIS — F324 Major depressive disorder, single episode, in partial remission: Secondary | ICD-10-CM | POA: Diagnosis not present

## 2020-04-11 DIAGNOSIS — I10 Essential (primary) hypertension: Secondary | ICD-10-CM | POA: Diagnosis not present

## 2020-05-08 ENCOUNTER — Other Ambulatory Visit: Payer: Self-pay

## 2020-05-08 ENCOUNTER — Encounter: Payer: Self-pay | Admitting: Anesthesiology

## 2020-05-08 ENCOUNTER — Ambulatory Visit: Payer: Medicare HMO | Attending: Anesthesiology | Admitting: Anesthesiology

## 2020-05-08 DIAGNOSIS — M797 Fibromyalgia: Secondary | ICD-10-CM

## 2020-05-08 DIAGNOSIS — M5431 Sciatica, right side: Secondary | ICD-10-CM

## 2020-05-08 DIAGNOSIS — F1190 Opioid use, unspecified, uncomplicated: Secondary | ICD-10-CM | POA: Diagnosis not present

## 2020-05-08 DIAGNOSIS — G894 Chronic pain syndrome: Secondary | ICD-10-CM | POA: Diagnosis not present

## 2020-05-08 DIAGNOSIS — M0579 Rheumatoid arthritis with rheumatoid factor of multiple sites without organ or systems involvement: Secondary | ICD-10-CM | POA: Diagnosis not present

## 2020-05-08 DIAGNOSIS — M25551 Pain in right hip: Secondary | ICD-10-CM | POA: Diagnosis not present

## 2020-05-08 DIAGNOSIS — M5432 Sciatica, left side: Secondary | ICD-10-CM

## 2020-05-08 DIAGNOSIS — M5136 Other intervertebral disc degeneration, lumbar region: Secondary | ICD-10-CM | POA: Diagnosis not present

## 2020-05-08 DIAGNOSIS — M25552 Pain in left hip: Secondary | ICD-10-CM

## 2020-05-08 DIAGNOSIS — M4716 Other spondylosis with myelopathy, lumbar region: Secondary | ICD-10-CM | POA: Diagnosis not present

## 2020-05-08 MED ORDER — HYDROCODONE-ACETAMINOPHEN 7.5-325 MG PO TABS: 1.0000 | ORAL_TABLET | Freq: Four times a day (QID) | ORAL | 0 refills | Status: DC | PRN

## 2020-05-08 NOTE — Progress Notes (Signed)
Virtual Visit via Telephone Note  I connected with Kelsey Diaz on 05/08/20 at  1:45 PM EDT by telephone and verified that I am speaking with the correct person using two identifiers.  Location: Patient: Home Provider: Pain control center   I discussed the limitations, risks, security and privacy concerns of performing an evaluation and management service by telephone and the availability of in person appointments. I also discussed with the patient that there may be a patient responsible charge related to this service. The patient expressed understanding and agreed to proceed.   History of Present Illness: I spoke with Kelsey Diaz today via telephone as she was unable to complete the video portion of the virtual conference but she reports that she has had good relief with her right lower extremity pain following her recent epidural back in February.  She still having some low back pain and initially had good result on the left side but is now getting more left-sided sciatica.  The right side has held up well.  No changes in lower extremity strength or function of the quality characteristic or distribution of the pain are noted.  Her bowel and bladder functions been stable as well.  She still taking her hydrocodone 7.5 mg tablets effectively at 4 times a day getting good relief with these.  She rates this about 50 to 75%.  Unfortunately more conservative medications have been ineffective for her.  The pain persist despite this.  She desires to proceed with a repeat epidural which she is already established an appointment for 4 April.   Observations/Objective:  Current Outpatient Medications:  .  ACCU-CHEK GUIDE test strip, , Disp: , Rfl:  .  Accu-Chek Softclix Lancets lancets, , Disp: , Rfl:  .  albuterol (VENTOLIN HFA) 108 (90 Base) MCG/ACT inhaler, Inhale 2 puffs into the lungs every 6 (six) hours as needed for shortness of breath. , Disp: , Rfl:  .  Blood Glucose Monitoring Suppl (ACCU-CHEK  GUIDE ME) w/Device KIT, , Disp: , Rfl:  .  Calcium Carbonate-Vitamin D 600-200 MG-UNIT TABS, Take 1 tablet by mouth daily. , Disp: , Rfl:  .  Clobetasol Prop Emollient Base 0.05 % emollient cream, APPLY 1 APPLICATION ONCE A WEEK AS NEEDED EXTERNALLY 90 DAYS, Disp: , Rfl:  .  DULoxetine (CYMBALTA) 60 MG capsule, Take 120 mg by mouth daily. , Disp: , Rfl:  .  etanercept (ENBREL) 50 MG/ML injection, Inject 50 mg into the skin once a week., Disp: , Rfl:  .  fluocinonide (LIDEX) 0.05 % external solution, Apply 1 application topically 2 (two) times daily as needed (skin irritation). , Disp: , Rfl:  .  Glucosamine-Chondroitin (MOVE FREE PO), Take by mouth daily., Disp: , Rfl:  .  [START ON 05/12/2021] HYDROcodone-acetaminophen (NORCO) 7.5-325 MG tablet, Take 1 tablet by mouth every 6 (six) hours as needed for moderate pain or severe pain., Disp: 120 tablet, Rfl: 0 .  [START ON 06/11/2020] HYDROcodone-acetaminophen (NORCO) 7.5-325 MG tablet, Take 1 tablet by mouth every 6 (six) hours as needed for moderate pain or severe pain., Disp: 120 tablet, Rfl: 0 .  LORazepam (ATIVAN) 0.5 MG tablet, Take 0.5 mg by mouth daily as needed. (Patient not taking: Reported on 04/03/2020), Disp: , Rfl:  .  meloxicam (MOBIC) 15 MG tablet, 1 tablet, Disp: , Rfl:  .  methotrexate 2.5 MG tablet, 8 tabs, Disp: , Rfl:  .  metoprolol tartrate (LOPRESSOR) 25 MG tablet, Take 0.5 tablets (12.5 mg total) by mouth 2 (two) times  daily., Disp: 90 tablet, Rfl: 3 .  Multiple Vitamin (MULTI-VITAMIN PO), Take 1 tablet by mouth daily. , Disp: , Rfl:  .  Omega-3 Fatty Acids (FISH OIL PO), Take 1,000 mg by mouth daily. , Disp: , Rfl:  .  omeprazole (PRILOSEC) 40 MG capsule, Take 1 capsule by mouth daily., Disp: , Rfl: 1 .  OSPHENA 60 MG TABS, Take 1 tablet by mouth daily., Disp: , Rfl:  .  predniSONE (DELTASONE) 10 MG tablet, Take 5 mg by mouth daily with breakfast. , Disp: , Rfl:  .  Probiotic Product (PROBIOTIC DAILY PO), Take by mouth. (Patient  not taking: Reported on 04/03/2020), Disp: , Rfl:  .  rosuvastatin (CRESTOR) 20 MG tablet, Take 20 mg by mouth once a week., Disp: , Rfl:  .  valsartan (DIOVAN) 40 MG tablet, Take 80 mg by mouth daily., Disp: , Rfl:   Assessment and Plan: 1. DDD (degenerative disc disease), lumbar   2. Bilateral sciatica   3. Bilateral hip pain   4. Lumbar spondylosis with myelopathy   5. Rheumatoid arthritis involving multiple sites with positive rheumatoid factor (HCC)   6. Chronic pain syndrome   7. Chronic, continuous use of opioids   8. Fibromyalgia   Based on our discussion today and upon review of the [place] practitioner database information going to refill her medications from March 20 in April 19.  This will be for the 7.5 mg tablets hydrocodone to be taken 4 times a day.  No other changes will be initiated in her pain management protocol.  We will schedule her for repeat epidural as discussed.  Her most recent epidural prior to that was back in November 2020.  She is responded favorably to epidurals in the past and they have enabled her to stay active and functional with good pain control.  I want her to continue follow-up with her primary care physicians as she has mentioned that she has some sinus infection ongoing.  Follow Up Instructions:    I discussed the assessment and treatment plan with the patient. The patient was provided an opportunity to ask questions and all were answered. The patient agreed with the plan and demonstrated an understanding of the instructions.   The patient was advised to call back or seek an in-person evaluation if the symptoms worsen or if the condition fails to improve as anticipated.  I provided 30 minutes of non-face-to-face time during this encounter.   Molli Barrows, MD

## 2020-05-09 DIAGNOSIS — J069 Acute upper respiratory infection, unspecified: Secondary | ICD-10-CM | POA: Diagnosis not present

## 2020-05-09 DIAGNOSIS — J441 Chronic obstructive pulmonary disease with (acute) exacerbation: Secondary | ICD-10-CM | POA: Diagnosis not present

## 2020-05-13 DIAGNOSIS — M47816 Spondylosis without myelopathy or radiculopathy, lumbar region: Secondary | ICD-10-CM | POA: Diagnosis not present

## 2020-05-13 DIAGNOSIS — E785 Hyperlipidemia, unspecified: Secondary | ICD-10-CM | POA: Diagnosis not present

## 2020-05-13 DIAGNOSIS — L900 Lichen sclerosus et atrophicus: Secondary | ICD-10-CM | POA: Diagnosis not present

## 2020-05-13 DIAGNOSIS — J441 Chronic obstructive pulmonary disease with (acute) exacerbation: Secondary | ICD-10-CM | POA: Diagnosis not present

## 2020-05-13 DIAGNOSIS — F324 Major depressive disorder, single episode, in partial remission: Secondary | ICD-10-CM | POA: Diagnosis not present

## 2020-05-13 DIAGNOSIS — B373 Candidiasis of vulva and vagina: Secondary | ICD-10-CM | POA: Diagnosis not present

## 2020-05-13 DIAGNOSIS — J449 Chronic obstructive pulmonary disease, unspecified: Secondary | ICD-10-CM | POA: Diagnosis not present

## 2020-05-13 DIAGNOSIS — K219 Gastro-esophageal reflux disease without esophagitis: Secondary | ICD-10-CM | POA: Diagnosis not present

## 2020-05-13 DIAGNOSIS — E1169 Type 2 diabetes mellitus with other specified complication: Secondary | ICD-10-CM | POA: Diagnosis not present

## 2020-05-13 DIAGNOSIS — M059 Rheumatoid arthritis with rheumatoid factor, unspecified: Secondary | ICD-10-CM | POA: Diagnosis not present

## 2020-05-13 DIAGNOSIS — I10 Essential (primary) hypertension: Secondary | ICD-10-CM | POA: Diagnosis not present

## 2020-05-13 DIAGNOSIS — N952 Postmenopausal atrophic vaginitis: Secondary | ICD-10-CM | POA: Diagnosis not present

## 2020-05-17 DIAGNOSIS — K219 Gastro-esophageal reflux disease without esophagitis: Secondary | ICD-10-CM | POA: Diagnosis not present

## 2020-05-17 DIAGNOSIS — E1169 Type 2 diabetes mellitus with other specified complication: Secondary | ICD-10-CM | POA: Diagnosis not present

## 2020-05-17 DIAGNOSIS — M797 Fibromyalgia: Secondary | ICD-10-CM | POA: Diagnosis not present

## 2020-05-17 DIAGNOSIS — E785 Hyperlipidemia, unspecified: Secondary | ICD-10-CM | POA: Diagnosis not present

## 2020-05-17 DIAGNOSIS — S81801A Unspecified open wound, right lower leg, initial encounter: Secondary | ICD-10-CM | POA: Diagnosis not present

## 2020-05-17 DIAGNOSIS — F324 Major depressive disorder, single episode, in partial remission: Secondary | ICD-10-CM | POA: Diagnosis not present

## 2020-05-17 DIAGNOSIS — I10 Essential (primary) hypertension: Secondary | ICD-10-CM | POA: Diagnosis not present

## 2020-05-17 DIAGNOSIS — J449 Chronic obstructive pulmonary disease, unspecified: Secondary | ICD-10-CM | POA: Diagnosis not present

## 2020-05-17 DIAGNOSIS — Z0000 Encounter for general adult medical examination without abnormal findings: Secondary | ICD-10-CM | POA: Diagnosis not present

## 2020-05-17 DIAGNOSIS — R0989 Other specified symptoms and signs involving the circulatory and respiratory systems: Secondary | ICD-10-CM | POA: Diagnosis not present

## 2020-05-17 DIAGNOSIS — G894 Chronic pain syndrome: Secondary | ICD-10-CM | POA: Diagnosis not present

## 2020-05-17 DIAGNOSIS — R946 Abnormal results of thyroid function studies: Secondary | ICD-10-CM | POA: Diagnosis not present

## 2020-05-20 ENCOUNTER — Other Ambulatory Visit: Payer: Self-pay | Admitting: Family Medicine

## 2020-05-20 DIAGNOSIS — R0989 Other specified symptoms and signs involving the circulatory and respiratory systems: Secondary | ICD-10-CM

## 2020-05-27 ENCOUNTER — Ambulatory Visit: Payer: Medicare HMO | Admitting: Anesthesiology

## 2020-05-27 DIAGNOSIS — F324 Major depressive disorder, single episode, in partial remission: Secondary | ICD-10-CM | POA: Diagnosis not present

## 2020-05-27 DIAGNOSIS — J449 Chronic obstructive pulmonary disease, unspecified: Secondary | ICD-10-CM | POA: Diagnosis not present

## 2020-05-27 DIAGNOSIS — E1169 Type 2 diabetes mellitus with other specified complication: Secondary | ICD-10-CM | POA: Diagnosis not present

## 2020-05-27 DIAGNOSIS — J441 Chronic obstructive pulmonary disease with (acute) exacerbation: Secondary | ICD-10-CM | POA: Diagnosis not present

## 2020-05-27 DIAGNOSIS — K219 Gastro-esophageal reflux disease without esophagitis: Secondary | ICD-10-CM | POA: Diagnosis not present

## 2020-05-27 DIAGNOSIS — I10 Essential (primary) hypertension: Secondary | ICD-10-CM | POA: Diagnosis not present

## 2020-05-27 DIAGNOSIS — E785 Hyperlipidemia, unspecified: Secondary | ICD-10-CM | POA: Diagnosis not present

## 2020-05-27 DIAGNOSIS — M059 Rheumatoid arthritis with rheumatoid factor, unspecified: Secondary | ICD-10-CM | POA: Diagnosis not present

## 2020-05-27 DIAGNOSIS — M47816 Spondylosis without myelopathy or radiculopathy, lumbar region: Secondary | ICD-10-CM | POA: Diagnosis not present

## 2020-05-28 ENCOUNTER — Other Ambulatory Visit: Payer: Medicare HMO

## 2020-06-03 ENCOUNTER — Ambulatory Visit
Admission: RE | Admit: 2020-06-03 | Discharge: 2020-06-03 | Disposition: A | Discharge status: Home / Self Care | Payer: Medicare HMO | Source: Ambulatory Visit | Attending: Family Medicine | Admitting: Family Medicine

## 2020-06-03 DIAGNOSIS — R0989 Other specified symptoms and signs involving the circulatory and respiratory systems: Secondary | ICD-10-CM | POA: Diagnosis not present

## 2020-06-12 DIAGNOSIS — M19072 Primary osteoarthritis, left ankle and foot: Secondary | ICD-10-CM | POA: Diagnosis not present

## 2020-06-12 DIAGNOSIS — M25569 Pain in unspecified knee: Secondary | ICD-10-CM | POA: Diagnosis not present

## 2020-06-12 DIAGNOSIS — M79671 Pain in right foot: Secondary | ICD-10-CM | POA: Diagnosis not present

## 2020-06-12 DIAGNOSIS — Z79899 Other long term (current) drug therapy: Secondary | ICD-10-CM | POA: Diagnosis not present

## 2020-06-12 DIAGNOSIS — I10 Essential (primary) hypertension: Secondary | ICD-10-CM | POA: Diagnosis not present

## 2020-06-12 DIAGNOSIS — M542 Cervicalgia: Secondary | ICD-10-CM | POA: Diagnosis not present

## 2020-06-12 DIAGNOSIS — J449 Chronic obstructive pulmonary disease, unspecified: Secondary | ICD-10-CM | POA: Diagnosis not present

## 2020-06-12 DIAGNOSIS — M1990 Unspecified osteoarthritis, unspecified site: Secondary | ICD-10-CM | POA: Diagnosis not present

## 2020-06-12 DIAGNOSIS — M79672 Pain in left foot: Secondary | ICD-10-CM | POA: Diagnosis not present

## 2020-06-12 DIAGNOSIS — M7732 Calcaneal spur, left foot: Secondary | ICD-10-CM | POA: Diagnosis not present

## 2020-06-12 DIAGNOSIS — M069 Rheumatoid arthritis, unspecified: Secondary | ICD-10-CM | POA: Diagnosis not present

## 2020-06-12 DIAGNOSIS — M19071 Primary osteoarthritis, right ankle and foot: Secondary | ICD-10-CM | POA: Diagnosis not present

## 2020-06-12 DIAGNOSIS — R5383 Other fatigue: Secondary | ICD-10-CM | POA: Diagnosis not present

## 2020-06-24 DIAGNOSIS — I10 Essential (primary) hypertension: Secondary | ICD-10-CM | POA: Diagnosis not present

## 2020-06-24 DIAGNOSIS — M059 Rheumatoid arthritis with rheumatoid factor, unspecified: Secondary | ICD-10-CM | POA: Diagnosis not present

## 2020-06-24 DIAGNOSIS — K219 Gastro-esophageal reflux disease without esophagitis: Secondary | ICD-10-CM | POA: Diagnosis not present

## 2020-06-24 DIAGNOSIS — J441 Chronic obstructive pulmonary disease with (acute) exacerbation: Secondary | ICD-10-CM | POA: Diagnosis not present

## 2020-06-24 DIAGNOSIS — E1169 Type 2 diabetes mellitus with other specified complication: Secondary | ICD-10-CM | POA: Diagnosis not present

## 2020-06-24 DIAGNOSIS — E785 Hyperlipidemia, unspecified: Secondary | ICD-10-CM | POA: Diagnosis not present

## 2020-06-24 DIAGNOSIS — F324 Major depressive disorder, single episode, in partial remission: Secondary | ICD-10-CM | POA: Diagnosis not present

## 2020-06-24 DIAGNOSIS — J449 Chronic obstructive pulmonary disease, unspecified: Secondary | ICD-10-CM | POA: Diagnosis not present

## 2020-07-10 ENCOUNTER — Ambulatory Visit: Payer: Medicare HMO | Attending: Anesthesiology | Admitting: Anesthesiology

## 2020-07-10 ENCOUNTER — Encounter: Payer: Self-pay | Admitting: Anesthesiology

## 2020-07-10 DIAGNOSIS — M5431 Sciatica, right side: Secondary | ICD-10-CM

## 2020-07-10 DIAGNOSIS — M25551 Pain in right hip: Secondary | ICD-10-CM

## 2020-07-10 DIAGNOSIS — M4716 Other spondylosis with myelopathy, lumbar region: Secondary | ICD-10-CM

## 2020-07-10 DIAGNOSIS — G894 Chronic pain syndrome: Secondary | ICD-10-CM

## 2020-07-10 DIAGNOSIS — M0579 Rheumatoid arthritis with rheumatoid factor of multiple sites without organ or systems involvement: Secondary | ICD-10-CM

## 2020-07-10 DIAGNOSIS — M5136 Other intervertebral disc degeneration, lumbar region: Secondary | ICD-10-CM

## 2020-07-10 DIAGNOSIS — M25552 Pain in left hip: Secondary | ICD-10-CM

## 2020-07-10 DIAGNOSIS — M47817 Spondylosis without myelopathy or radiculopathy, lumbosacral region: Secondary | ICD-10-CM

## 2020-07-10 DIAGNOSIS — M79641 Pain in right hand: Secondary | ICD-10-CM

## 2020-07-10 DIAGNOSIS — F1190 Opioid use, unspecified, uncomplicated: Secondary | ICD-10-CM | POA: Diagnosis not present

## 2020-07-10 DIAGNOSIS — M5432 Sciatica, left side: Secondary | ICD-10-CM

## 2020-07-10 DIAGNOSIS — M79642 Pain in left hand: Secondary | ICD-10-CM

## 2020-07-10 DIAGNOSIS — M797 Fibromyalgia: Secondary | ICD-10-CM | POA: Diagnosis not present

## 2020-07-10 MED ORDER — HYDROCODONE-ACETAMINOPHEN 7.5-325 MG PO TABS: 1.0000 | ORAL_TABLET | Freq: Four times a day (QID) | ORAL | 0 refills | Status: DC | PRN

## 2020-07-10 NOTE — Progress Notes (Signed)
Virtual Visit via Telephone Note  I connected with Kelsey Diaz on 07/10/20 at 10:15 AM EDT by telephone and verified that I am speaking with the correct person using two identifiers.  Location: Patient: Home Provider: Pain control center   I discussed the limitations, risks, security and privacy concerns of performing an evaluation and management service by telephone and the availability of in person appointments. I also discussed with the patient that there may be a patient responsible charge related to this service. The patient expressed understanding and agreed to proceed.   History of Present Illness:   I spoke with Kelsey Diaz today via telephone as we were unable to link for the video portion of the virtual conference.  She has been doing reasonably well but has had some more recurrence of the sciatica like symptoms in the left and right calf.  This is a recurrent problem for her.  She has epidural steroid injections periodically to keep this under control and these generally are effective.  She reports 75 to 80% relief in her low back pain and numbness 100% relief in her sciatica symptoms following the epidural for about the next 2 to 3 months.  Her last injection was back in February.  She also takes her opioids approximately 3-4 times a day to keep the pain under good control.  This is for hip low back and leg pain.  She also has some hand pain with a long standing history of osteoarthritis that has been difficult to treat with conservative management.  The medications work well for her and keep her pain under good control with no side effects reported.  She does report that she has had more problems recently with insomnia and generally gets no more than 3 to 4 hours of continuous sleep at night.  No other change in her lower extremity strength function bowel or bladder function is noted at this time.  Review of Systems  Constitutional: Negative for chills, fever and weight loss.   Eyes: Negative.   Cardiovascular: Negative for chest pain, orthopnea and claudication.  Genitourinary: Negative for dysuria and urgency.  Neurological: Negative for dizziness, weakness and headaches.  Psychiatric/Behavioral: Negative for depression.   Observations/Objective:  Current Outpatient Medications:  .  ACCU-CHEK GUIDE test strip, , Disp: , Rfl:  .  Accu-Chek Softclix Lancets lancets, , Disp: , Rfl:  .  albuterol (VENTOLIN HFA) 108 (90 Base) MCG/ACT inhaler, Inhale 2 puffs into the lungs every 6 (six) hours as needed for shortness of breath. , Disp: , Rfl:  .  Blood Glucose Monitoring Suppl (ACCU-CHEK GUIDE ME) w/Device KIT, , Disp: , Rfl:  .  Calcium Carbonate-Vitamin D 600-200 MG-UNIT TABS, Take 1 tablet by mouth daily. , Disp: , Rfl:  .  Clobetasol Prop Emollient Base 0.05 % emollient cream, APPLY 1 APPLICATION ONCE A WEEK AS NEEDED EXTERNALLY 90 DAYS, Disp: , Rfl:  .  DULoxetine (CYMBALTA) 60 MG capsule, Take 120 mg by mouth daily. , Disp: , Rfl:  .  etanercept (ENBREL) 50 MG/ML injection, Inject 50 mg into the skin once a week., Disp: , Rfl:  .  fluocinonide (LIDEX) 0.05 % external solution, Apply 1 application topically 2 (two) times daily as needed (skin irritation). , Disp: , Rfl:  .  Glucosamine-Chondroitin (MOVE FREE PO), Take by mouth daily., Disp: , Rfl:  .  [START ON 05/12/2021] HYDROcodone-acetaminophen (NORCO) 7.5-325 MG tablet, Take 1 tablet by mouth every 6 (six) hours as needed for moderate pain or severe  pain., Disp: 120 tablet, Rfl: 0 .  HYDROcodone-acetaminophen (NORCO) 7.5-325 MG tablet, Take 1 tablet by mouth every 6 (six) hours as needed for moderate pain or severe pain., Disp: 120 tablet, Rfl: 0 .  LORazepam (ATIVAN) 0.5 MG tablet, Take 0.5 mg by mouth daily as needed. (Patient not taking: Reported on 04/03/2020), Disp: , Rfl:  .  meloxicam (MOBIC) 15 MG tablet, 1 tablet, Disp: , Rfl:  .  methotrexate 2.5 MG tablet, 8 tabs, Disp: , Rfl:  .  metoprolol tartrate  (LOPRESSOR) 25 MG tablet, Take 0.5 tablets (12.5 mg total) by mouth 2 (two) times daily., Disp: 90 tablet, Rfl: 3 .  Multiple Vitamin (MULTI-VITAMIN PO), Take 1 tablet by mouth daily. , Disp: , Rfl:  .  Omega-3 Fatty Acids (FISH OIL PO), Take 1,000 mg by mouth daily. , Disp: , Rfl:  .  omeprazole (PRILOSEC) 40 MG capsule, Take 1 capsule by mouth daily., Disp: , Rfl: 1 .  OSPHENA 60 MG TABS, Take 1 tablet by mouth daily., Disp: , Rfl:  .  predniSONE (DELTASONE) 10 MG tablet, Take 5 mg by mouth daily with breakfast. , Disp: , Rfl:  .  Probiotic Product (PROBIOTIC DAILY PO), Take by mouth. (Patient not taking: Reported on 04/03/2020), Disp: , Rfl:  .  rosuvastatin (CRESTOR) 20 MG tablet, Take 20 mg by mouth once a week., Disp: , Rfl:  .  valsartan (DIOVAN) 40 MG tablet, Take 80 mg by mouth daily., Disp: , Rfl:  Past Medical History:  Diagnosis Date  . Allergic rhinitis   . Anxiety   . Asthma   . Bilateral hip pain 11/28/2018  . Colitis   . COPD (chronic obstructive pulmonary disease) ([place])   . Fibromyalgia 05/16/2019  . GERD (gastroesophageal reflux disease)    With esophageal strictures  . Hypertension   . Hyperthyroidism   . OSA (obstructive sleep apnea) 02/09/2012   No mask.  Did not tolerate  . Osteoarthritis   . Rheumatoid arthritis ([place]) 10/03/2018  . Assessment and Plan: 1. DDD (degenerative disc disease), lumbar   2. Bilateral sciatica   3. Bilateral hip pain   4. Lumbar spondylosis with myelopathy   5. Rheumatoid arthritis involving multiple sites with positive rheumatoid factor (HCC)   6. Chronic pain syndrome   7. Chronic, continuous use of opioids   8. Fibromyalgia   9. Pain in both hands   10. Facet arthritis of lumbosacral region   Based on our discussion today and upon review of the [facility] practitioner database information I am going to refill her medications.  This will be dated for May 18 and June 17.  These opioid medications are working well for her and  keeping her pain under good control.  Furthermore she is desiring to proceed with a repeat epidural and these have been very effective in keeping her pain under good management.  The sciatica symptoms have responded favorably and we will address this in approximately 1 month per her request.  I want her to continue with stretching strengthening exercises as reviewed today with scheduled return for clinic in 1 month.  Follow Up Instructions:    I discussed the assessment and treatment plan with the patient. The patient was provided an opportunity to ask questions and all were answered. The patient agreed with the plan and demonstrated an understanding of the instructions.   The patient was advised to call back or seek an in-person evaluation if the symptoms worsen or if the condition fails  to improve as anticipated.  I provided 30 minutes of non-face-to-face time during this encounter.   Molli Barrows, MD

## 2020-08-21 DIAGNOSIS — F324 Major depressive disorder, single episode, in partial remission: Secondary | ICD-10-CM | POA: Diagnosis not present

## 2020-08-21 DIAGNOSIS — E785 Hyperlipidemia, unspecified: Secondary | ICD-10-CM | POA: Diagnosis not present

## 2020-08-21 DIAGNOSIS — J441 Chronic obstructive pulmonary disease with (acute) exacerbation: Secondary | ICD-10-CM | POA: Diagnosis not present

## 2020-08-21 DIAGNOSIS — M47816 Spondylosis without myelopathy or radiculopathy, lumbar region: Secondary | ICD-10-CM | POA: Diagnosis not present

## 2020-08-21 DIAGNOSIS — K219 Gastro-esophageal reflux disease without esophagitis: Secondary | ICD-10-CM | POA: Diagnosis not present

## 2020-08-21 DIAGNOSIS — I10 Essential (primary) hypertension: Secondary | ICD-10-CM | POA: Diagnosis not present

## 2020-08-21 DIAGNOSIS — E1169 Type 2 diabetes mellitus with other specified complication: Secondary | ICD-10-CM | POA: Diagnosis not present

## 2020-08-21 DIAGNOSIS — J449 Chronic obstructive pulmonary disease, unspecified: Secondary | ICD-10-CM | POA: Diagnosis not present

## 2020-08-21 DIAGNOSIS — M059 Rheumatoid arthritis with rheumatoid factor, unspecified: Secondary | ICD-10-CM | POA: Diagnosis not present

## 2020-09-03 ENCOUNTER — Ambulatory Visit: Payer: Medicare HMO | Admitting: Anesthesiology

## 2020-09-05 ENCOUNTER — Other Ambulatory Visit: Payer: Self-pay

## 2020-09-05 ENCOUNTER — Ambulatory Visit: Payer: Medicare HMO | Attending: Anesthesiology | Admitting: Anesthesiology

## 2020-09-05 DIAGNOSIS — M47817 Spondylosis without myelopathy or radiculopathy, lumbosacral region: Secondary | ICD-10-CM | POA: Diagnosis not present

## 2020-09-05 DIAGNOSIS — M25552 Pain in left hip: Secondary | ICD-10-CM

## 2020-09-05 DIAGNOSIS — M5431 Sciatica, right side: Secondary | ICD-10-CM | POA: Diagnosis not present

## 2020-09-05 DIAGNOSIS — M797 Fibromyalgia: Secondary | ICD-10-CM | POA: Diagnosis not present

## 2020-09-05 DIAGNOSIS — G894 Chronic pain syndrome: Secondary | ICD-10-CM | POA: Diagnosis not present

## 2020-09-05 DIAGNOSIS — F1190 Opioid use, unspecified, uncomplicated: Secondary | ICD-10-CM | POA: Diagnosis not present

## 2020-09-05 DIAGNOSIS — M51369 Other intervertebral disc degeneration, lumbar region without mention of lumbar back pain or lower extremity pain: Secondary | ICD-10-CM

## 2020-09-05 DIAGNOSIS — M5136 Other intervertebral disc degeneration, lumbar region: Secondary | ICD-10-CM

## 2020-09-05 DIAGNOSIS — M0579 Rheumatoid arthritis with rheumatoid factor of multiple sites without organ or systems involvement: Secondary | ICD-10-CM | POA: Diagnosis not present

## 2020-09-05 DIAGNOSIS — M25551 Pain in right hip: Secondary | ICD-10-CM

## 2020-09-05 DIAGNOSIS — M5432 Sciatica, left side: Secondary | ICD-10-CM

## 2020-09-05 DIAGNOSIS — M4716 Other spondylosis with myelopathy, lumbar region: Secondary | ICD-10-CM

## 2020-09-06 ENCOUNTER — Encounter: Payer: Self-pay | Admitting: Anesthesiology

## 2020-09-06 MED ORDER — HYDROCODONE-ACETAMINOPHEN 7.5-325 MG PO TABS: 1.0000 | ORAL_TABLET | Freq: Four times a day (QID) | ORAL | 0 refills | Status: DC | PRN

## 2020-09-06 MED ORDER — HYDROCODONE-ACETAMINOPHEN 7.5-325 MG PO TABS: 1.0000 | ORAL_TABLET | Freq: Four times a day (QID) | ORAL | 0 refills | Status: AC | PRN

## 2020-09-16 NOTE — Progress Notes (Signed)
Virtual Visit via Telephone Note  I connected with Kelsey Diaz on 09/16/20 at  9:50 AM EDT by telephone and verified that I am speaking with the correct person using two identifiers.  Location: Patient: Home Provider: Pain control center   I discussed the limitations, risks, security and privacy concerns of performing an evaluation and management service by telephone and the availability of in person appointments. I also discussed with the patient that there may be a patient responsible charge related to this service. The patient expressed understanding and agreed to proceed.   History of Present Illness: I spoke with Kelsey Diaz today via telephone as we were unable to link for the video portion of the virtual conference.  She states that her low back pain and leg pain have been stable in nature with no recent changes.  She continues to take her medications as prescribed and these continue to work well for her.  She had epidural injection back in February still having some relief from that is having some baseline recurrence of the same quality pain as previous.  She would like to reschedule for a repeat epidural in about 2 months if possible.  She takes her medications as prescribed and these continue to work well for her.  She generally gets 50 to 75% improvement with the medications lasting several hours before she has recurrence of her baseline pain.  She is gone through conservative therapy in addition to physical therapy and medication regimens etc. with more limited success.  Her current regimen she reports is doing well.  Otherwise she is in her usual state of health and no new changes in the quality characteristic or distribution of her pain any changes in lower extremity strength or function.  Review of systems: General: No fevers or chills Pulmonary: No shortness of breath or dyspnea Cardiac: No angina or palpitations or lightheadedness GI: No abdominal pain or constipation Psych: No  depression    Observations/Objective:  Current Outpatient Medications:    ACCU-CHEK GUIDE test strip, , Disp: , Rfl:    Accu-Chek Softclix Lancets lancets, , Disp: , Rfl:    albuterol (VENTOLIN HFA) 108 (90 Base) MCG/ACT inhaler, Inhale 2 puffs into the lungs every 6 (six) hours as needed for shortness of breath. , Disp: , Rfl:    Blood Glucose Monitoring Suppl (ACCU-CHEK GUIDE ME) w/Device KIT, , Disp: , Rfl:    Calcium Carbonate-Vitamin D 600-200 MG-UNIT TABS, Take 1 tablet by mouth daily. , Disp: , Rfl:    Clobetasol Prop Emollient Base 0.05 % emollient cream, APPLY 1 APPLICATION ONCE A WEEK AS NEEDED EXTERNALLY 90 DAYS, Disp: , Rfl:    DULoxetine (CYMBALTA) 60 MG capsule, Take 120 mg by mouth daily. , Disp: , Rfl:    etanercept (ENBREL) 50 MG/ML injection, Inject 50 mg into the skin once a week., Disp: , Rfl:    fluocinonide (LIDEX) 0.05 % external solution, Apply 1 application topically 2 (two) times daily as needed (skin irritation). , Disp: , Rfl:    Glucosamine-Chondroitin (MOVE FREE PO), Take by mouth daily., Disp: , Rfl:    [START ON 09/11/2021] HYDROcodone-acetaminophen (NORCO) 7.5-325 MG tablet, Take 1 tablet by mouth every 6 (six) hours as needed for moderate pain or severe pain., Disp: 120 tablet, Rfl: 0   [START ON 10/11/2020] HYDROcodone-acetaminophen (NORCO) 7.5-325 MG tablet, Take 1 tablet by mouth every 6 (six) hours as needed for moderate pain or severe pain., Disp: 120 tablet, Rfl: 0   LORazepam (ATIVAN) 0.5 MG  tablet, Take 0.5 mg by mouth daily as needed. (Patient not taking: Reported on 04/03/2020), Disp: , Rfl:    meloxicam (MOBIC) 15 MG tablet, 1 tablet, Disp: , Rfl:    methotrexate 2.5 MG tablet, 8 tabs, Disp: , Rfl:    metoprolol tartrate (LOPRESSOR) 25 MG tablet, Take 0.5 tablets (12.5 mg total) by mouth 2 (two) times daily., Disp: 90 tablet, Rfl: 3   Multiple Vitamin (MULTI-VITAMIN PO), Take 1 tablet by mouth daily. , Disp: , Rfl:    Omega-3 Fatty Acids (FISH OIL PO),  Take 1,000 mg by mouth daily. , Disp: , Rfl:    omeprazole (PRILOSEC) 40 MG capsule, Take 1 capsule by mouth daily., Disp: , Rfl: 1   OSPHENA 60 MG TABS, Take 1 tablet by mouth daily., Disp: , Rfl:    predniSONE (DELTASONE) 10 MG tablet, Take 5 mg by mouth daily with breakfast. , Disp: , Rfl:    Probiotic Product (PROBIOTIC DAILY PO), Take by mouth. (Patient not taking: Reported on 04/03/2020), Disp: , Rfl:    rosuvastatin (CRESTOR) 20 MG tablet, Take 20 mg by mouth once a week., Disp: , Rfl:    valsartan (DIOVAN) 40 MG tablet, Take 80 mg by mouth daily., Disp: , Rfl:    Assessment and Plan: 1. Chronic, continuous use of opioids   2. DDD (degenerative disc disease), lumbar   3. Bilateral sciatica   4. Bilateral hip pain   5. Lumbar spondylosis with myelopathy   6. Fibromyalgia   7. Chronic pain syndrome   8. Rheumatoid arthritis involving multiple sites with positive rheumatoid factor ([place])   9. Facet arthritis of lumbosacral region   Based on our review of the [facility] practitioner database information and her current response to therapy I am going to refill her medications for the next 2 months furthermore she will be scheduled for an epidural at that time.  No other changes will be initiated at this time.  I encouraged her to continue stretching strengthening exercises as well with return to clinic in 2 months and continue follow-up with her primary care physicians for baseline medical care.  Follow Up Instructions:    I discussed the assessment and treatment plan with the patient. The patient was provided an opportunity to ask questions and all were answered. The patient agreed with the plan and demonstrated an understanding of the instructions.   The patient was advised to call back or seek an in-person evaluation if the symptoms worsen or if the condition fails to improve as anticipated.  I provided 30 minutes of non-face-to-face time during this encounter.   Molli Barrows, MD

## 2020-09-20 DIAGNOSIS — E785 Hyperlipidemia, unspecified: Secondary | ICD-10-CM | POA: Diagnosis not present

## 2020-09-20 DIAGNOSIS — M059 Rheumatoid arthritis with rheumatoid factor, unspecified: Secondary | ICD-10-CM | POA: Diagnosis not present

## 2020-09-20 DIAGNOSIS — K219 Gastro-esophageal reflux disease without esophagitis: Secondary | ICD-10-CM | POA: Diagnosis not present

## 2020-09-20 DIAGNOSIS — J449 Chronic obstructive pulmonary disease, unspecified: Secondary | ICD-10-CM | POA: Diagnosis not present

## 2020-09-20 DIAGNOSIS — E1169 Type 2 diabetes mellitus with other specified complication: Secondary | ICD-10-CM | POA: Diagnosis not present

## 2020-09-20 DIAGNOSIS — J441 Chronic obstructive pulmonary disease with (acute) exacerbation: Secondary | ICD-10-CM | POA: Diagnosis not present

## 2020-09-20 DIAGNOSIS — F324 Major depressive disorder, single episode, in partial remission: Secondary | ICD-10-CM | POA: Diagnosis not present

## 2020-09-20 DIAGNOSIS — M47816 Spondylosis without myelopathy or radiculopathy, lumbar region: Secondary | ICD-10-CM | POA: Diagnosis not present

## 2020-09-20 DIAGNOSIS — I10 Essential (primary) hypertension: Secondary | ICD-10-CM | POA: Diagnosis not present

## 2020-09-30 ENCOUNTER — Other Ambulatory Visit: Payer: Self-pay | Admitting: Anesthesiology

## 2020-09-30 ENCOUNTER — Encounter: Payer: Self-pay | Admitting: Anesthesiology

## 2020-09-30 ENCOUNTER — Other Ambulatory Visit: Payer: Self-pay

## 2020-09-30 ENCOUNTER — Ambulatory Visit (HOSPITAL_BASED_OUTPATIENT_CLINIC_OR_DEPARTMENT_OTHER): Payer: Medicare HMO | Admitting: Anesthesiology

## 2020-09-30 ENCOUNTER — Ambulatory Visit
Admission: RE | Admit: 2020-09-30 | Discharge: 2020-09-30 | Disposition: A | Discharge status: Home / Self Care | Payer: Medicare HMO | Source: Ambulatory Visit | Attending: Anesthesiology | Admitting: Anesthesiology

## 2020-09-30 VITALS — BP 120/86 | HR 76 | Temp 96.4°F | Resp 15 | Ht 66.0 in | Wt 198.0 lb

## 2020-09-30 DIAGNOSIS — M25552 Pain in left hip: Secondary | ICD-10-CM

## 2020-09-30 DIAGNOSIS — R52 Pain, unspecified: Secondary | ICD-10-CM | POA: Insufficient documentation

## 2020-09-30 DIAGNOSIS — F1190 Opioid use, unspecified, uncomplicated: Secondary | ICD-10-CM

## 2020-09-30 DIAGNOSIS — M0579 Rheumatoid arthritis with rheumatoid factor of multiple sites without organ or systems involvement: Secondary | ICD-10-CM | POA: Insufficient documentation

## 2020-09-30 DIAGNOSIS — M5431 Sciatica, right side: Secondary | ICD-10-CM | POA: Diagnosis not present

## 2020-09-30 DIAGNOSIS — M25551 Pain in right hip: Secondary | ICD-10-CM | POA: Diagnosis not present

## 2020-09-30 DIAGNOSIS — G894 Chronic pain syndrome: Secondary | ICD-10-CM

## 2020-09-30 DIAGNOSIS — M4716 Other spondylosis with myelopathy, lumbar region: Secondary | ICD-10-CM | POA: Diagnosis not present

## 2020-09-30 DIAGNOSIS — M797 Fibromyalgia: Secondary | ICD-10-CM

## 2020-09-30 DIAGNOSIS — M5432 Sciatica, left side: Secondary | ICD-10-CM | POA: Insufficient documentation

## 2020-09-30 DIAGNOSIS — M5136 Other intervertebral disc degeneration, lumbar region: Secondary | ICD-10-CM

## 2020-09-30 MED ORDER — TRIAMCINOLONE ACETONIDE 40 MG/ML IJ SUSP
40.0000 mg | Freq: Once | INTRAMUSCULAR | Status: AC
  Administered 2020-09-30: 40 mg
  Filled 2020-09-30: qty 1

## 2020-09-30 MED ORDER — HYDROCODONE-ACETAMINOPHEN 7.5-325 MG PO TABS: 1.0000 | ORAL_TABLET | Freq: Four times a day (QID) | ORAL | 0 refills | Status: DC | PRN

## 2020-09-30 MED ORDER — ROPIVACAINE HCL 2 MG/ML IJ SOLN
10.0000 mL | Freq: Once | INTRAMUSCULAR | Status: AC
  Administered 2020-09-30: 10 mL via EPIDURAL

## 2020-09-30 MED ORDER — IOHEXOL 180 MG/ML  SOLN
10.0000 mL | Freq: Once | INTRAMUSCULAR | Status: AC | PRN
  Administered 2020-09-30: 5 mL via EPIDURAL

## 2020-09-30 MED ORDER — LIDOCAINE HCL (PF) 1 % IJ SOLN
5.0000 mL | Freq: Once | INTRAMUSCULAR | Status: AC
  Administered 2020-09-30: 5 mL via SUBCUTANEOUS
  Filled 2020-09-30: qty 5

## 2020-09-30 MED ORDER — SODIUM CHLORIDE 0.9% FLUSH
10.0000 mL | Freq: Once | INTRAVENOUS | Status: AC
  Administered 2020-09-30: 10 mL

## 2020-09-30 MED ORDER — ROPIVACAINE HCL 2 MG/ML IJ SOLN
INTRAMUSCULAR | Status: AC
  Filled 2020-09-30: qty 20

## 2020-09-30 MED ORDER — TRIAMCINOLONE ACETONIDE 40 MG/ML IJ SUSP
INTRAMUSCULAR | Status: AC
  Filled 2020-09-30: qty 1

## 2020-09-30 NOTE — Patient Instructions (Signed)

## 2020-10-01 ENCOUNTER — Encounter: Payer: Self-pay | Admitting: Anesthesiology

## 2020-10-01 ENCOUNTER — Telehealth: Payer: Self-pay | Admitting: *Deleted

## 2020-10-01 NOTE — Progress Notes (Signed)
Subjective:  Patient ID: Kelsey Diaz, female    DOB: Dec 17, 1945  Age: 75 y.o. MRN: [id number]  CC: Back Pain (low)   Procedure: L5-S1 epidural steroid under fluoroscopic guidance with no sedation  HPI Kelsey Diaz presents for reevaluation.  Kelsey Diaz has continued to have low back pain with sciatica symptoms worse on the right than left side.  This affects the lower extremities with calf cramping and occasional numbness affecting the feet.  She has had this pain persistently for the past few months.  She was scheduled for an epidural back in June however had to cancel that appointment and presents today for an epidural steroid injection.  She receives these periodically to help with her low back pain and sciatica symptoms and these work well for her.  She generally gets 75 to 80% relief of the low back pain and 100% relief of her leg pain for 4 to 6 weeks before she has a gradual return of baseline pain.  Unfortunately more conservative therapy has failed for her.  No other changes in lower extremity strength or function or bowel or bladder function are noted.  She takes her medications as prescribed and these continue to work well for her without side effect.  They also help manage her pain and she reports a 75% pain reduction with the medications and is tolerating these well.  Similarly she has failed more conservative medication therapy.  She is doing physical therapy exercises with limited success.  She also has some right posterior tension component headaches that she is reporting.  No fevers chills cough or neck rigidity or stiffness is reported.  No upper extremity numbness or tingling is associated with this.  She denies any focal neurologic deficits based on questioning today as well.  Outpatient Medications Prior to Visit  Medication Sig Dispense Refill   ACCU-CHEK GUIDE test strip      Accu-Chek Softclix Lancets lancets      albuterol (VENTOLIN HFA) 108 (90 Base) MCG/ACT inhaler  Inhale 2 puffs into the lungs every 6 (six) hours as needed for shortness of breath.      Blood Glucose Monitoring Suppl (ACCU-CHEK GUIDE ME) w/Device KIT      Calcium Carbonate-Vitamin D 600-200 MG-UNIT TABS Take 1 tablet by mouth daily.      Clobetasol Prop Emollient Base 0.05 % emollient cream APPLY 1 APPLICATION ONCE A WEEK AS NEEDED EXTERNALLY 90 DAYS     DULoxetine (CYMBALTA) 60 MG capsule Take 120 mg by mouth daily.      etanercept (ENBREL) 50 MG/ML injection Inject 50 mg into the skin once a week.     fluocinonide (LIDEX) 0.05 % external solution Apply 1 application topically 2 (two) times daily as needed (skin irritation).      folic acid (FOLVITE) 1 MG tablet Take 1 tablet by mouth daily.     Glucosamine-Chondroitin (MOVE FREE PO) Take by mouth daily.     [START ON 10/11/2020] HYDROcodone-acetaminophen (NORCO) 7.5-325 MG tablet Take 1 tablet by mouth every 6 (six) hours as needed for moderate pain or severe pain. 120 tablet 0   LORazepam (ATIVAN) 0.5 MG tablet Take 0.5 mg by mouth daily as needed.     meloxicam (MOBIC) 15 MG tablet 1 tablet     methotrexate 2.5 MG tablet 8 tabs     metoprolol tartrate (LOPRESSOR) 25 MG tablet Take 0.5 tablets (12.5 mg total) by mouth 2 (two) times daily. 90 tablet 3   Multiple Vitamin (MULTI-VITAMIN PO)  Take 1 tablet by mouth daily.      Omega-3 Fatty Acids (FISH OIL PO) Take 1,000 mg by mouth daily.      omeprazole (PRILOSEC) 40 MG capsule Take 1 capsule by mouth daily.  1   OSPHENA 60 MG TABS Take 1 tablet by mouth daily.     predniSONE (DELTASONE) 10 MG tablet Take 5 mg by mouth daily with breakfast.      rosuvastatin (CRESTOR) 20 MG tablet Take 20 mg by mouth once a week.     valsartan (DIOVAN) 40 MG tablet Take 80 mg by mouth daily.     Probiotic Product (PROBIOTIC DAILY PO) Take by mouth. (Patient not taking: No sig reported)     [START ON 09/11/2021] HYDROcodone-acetaminophen (NORCO) 7.5-325 MG tablet Take 1 tablet by mouth every 6 (six) hours as  needed for moderate pain or severe pain. 120 tablet 0   No facility-administered medications prior to visit.    Review of Systems CNS: No confusion or sedation Cardiac: No angina or palpitations GI: No abdominal pain or constipation Constitutional: No nausea vomiting fevers or chills  Objective:  BP 120/86   Pulse 76 Comment: nsr  Temp (!) 96.4 F (35.8 C)   Resp 15   Ht $R'5\' 6"'zm$  (1.676 m)   Wt 198 lb (89.8 kg)   SpO2 99%   BMI 31.96 kg/m    BP Readings from Last 3 Encounters:  09/30/20 120/86  04/03/20 (!) 138/97  12/13/19 134/76     Wt Readings from Last 3 Encounters:  09/30/20 198 lb (89.8 kg)  04/03/20 200 lb (90.7 kg)  12/13/19 199 lb 12.8 oz (90.6 kg)     Physical Exam Pt is alert and oriented PERRL EOMI HEART IS RRR no murmur or rub LCTA no wheezing or rales MUSCULOSKELETAL reveals some paraspinous muscle tenderness but no overt trigger points.  Her muscle tone and bulk is at baseline and she walks with an antalgic gait.  She has also having some tenderness over the right occipital region that reproduce some tension-like component headache.  Labs  No results found for: HGBA1C Lab Results  Component Value Date   CREATININE 0.72 09/13/2017    -------------------------------------------------------------------------------------------------------------------- Lab Results  Component Value Date   WBC 9.8 09/08/2017   HGB 11.8 (L) 09/08/2017   HCT 35.3 (L) 09/08/2017   PLT 189 09/08/2017   GLUCOSE 98 09/13/2017   ALT 20 06/03/2017   AST 22 06/03/2017   NA 138 09/13/2017   K 3.4 (L) 09/13/2017   CL 100 09/13/2017   CREATININE 0.72 09/13/2017   BUN 11 09/13/2017   CO2 30 09/13/2017   TSH 1.260 06/03/2017   INR 1.0 06/03/2017    --------------------------------------------------------------------------------------------------------------------- DG PAIN CLINIC C-ARM 1-60 MIN NO REPORT  Result Date: 09/30/2020 Fluoro was used, but no Radiologist  interpretation will be provided. Please refer to "NOTES" tab for provider progress note.    Assessment & Plan:   Kelsey Diaz was seen today for back pain.  Diagnoses and all orders for this visit:  DDD (degenerative disc disease), lumbar  Bilateral sciatica  Bilateral hip pain  Lumbar spondylosis with myelopathy  Chronic, continuous use of opioids  Fibromyalgia  Chronic pain syndrome  Rheumatoid arthritis involving multiple sites with positive rheumatoid factor (HCC)  Other orders -     triamcinolone acetonide (KENALOG-40) injection 40 mg -     sodium chloride flush (NS) 0.9 % injection 10 mL -     ropivacaine (PF) 2 mg/mL (0.2%) (  NAROPIN) injection 10 mL -     lidocaine (PF) (XYLOCAINE) 1 % injection 5 mL -     iohexol (OMNIPAQUE) 180 MG/ML injection 10 mL -     HYDROcodone-acetaminophen (NORCO) 7.5-325 MG tablet; Take 1 tablet by mouth every 6 (six) hours as needed for moderate pain or severe pain.        ----------------------------------------------------------------------------------------------------------------------  Problem List Items Addressed This Visit       Unprioritized   Bilateral hip pain   Fibromyalgia   Relevant Medications   HYDROcodone-acetaminophen (NORCO) 7.5-325 MG tablet (Start on 11/10/2021)   Rheumatoid arthritis ([place])   Relevant Medications   HYDROcodone-acetaminophen (NORCO) 7.5-325 MG tablet (Start on 11/10/2021)   Other Visit Diagnoses     DDD (degenerative disc disease), lumbar    -  Primary   Relevant Medications   triamcinolone acetonide (KENALOG-40) injection 40 mg (Completed)   HYDROcodone-acetaminophen (NORCO) 7.5-325 MG tablet (Start on 11/10/2021)   Bilateral sciatica       Lumbar spondylosis with myelopathy       Relevant Medications   triamcinolone acetonide (KENALOG-40) injection 40 mg (Completed)   HYDROcodone-acetaminophen (NORCO) 7.5-325 MG tablet (Start on 11/10/2021)   Chronic, continuous use of opioids       Chronic  pain syndrome       Relevant Medications   triamcinolone acetonide (KENALOG-40) injection 40 mg (Completed)   ropivacaine (PF) 2 mg/mL (0.2%) (NAROPIN) injection 10 mL (Completed)   lidocaine (PF) (XYLOCAINE) 1 % injection 5 mL (Completed)   HYDROcodone-acetaminophen (NORCO) 7.5-325 MG tablet (Start on 11/10/2021)         ----------------------------------------------------------------------------------------------------------------------  1. DDD (degenerative disc disease), lumbar We will proceed with an epidural injection today.  We gone over the risks and benefits of the procedure with her in full detail.  She has responded favorably in the past.  To the epidurals.  We will have her continue with stretching strengthening exercises with return to clinic in approximately 2 to 3 months for reevaluation.  2. Bilateral sciatica As above  3. Bilateral hip pain   4. Lumbar spondylosis with myelopathy   5. Chronic, continuous use of opioids I have reviewed the [facility] practitioner database information and it is appropriate for refills.  These will be dated for August 19 and September 18.  No other changes in medication management will be initiated today.  6. Fibromyalgia Continue stretching and strengthening exercises  7. Chronic pain syndrome As above  8. Rheumatoid arthritis involving multiple sites with positive rheumatoid factor ([place]) As above    ----------------------------------------------------------------------------------------------------------------------  I am having Kelsey Diaz. Se[place] maintain her Calcium Carbonate-Vitamin D, Multiple Vitamin (MULTI-VITAMIN PO), DULoxetine, albuterol, Omega-3 Fatty Acids (FISH OIL PO), fluocinonide, omeprazole, predniSONE, Glucosamine-Chondroitin (MOVE FREE PO), Probiotic Product (PROBIOTIC DAILY PO), Clobetasol Prop Emollient Base, Osphena, valsartan, Accu-Chek Guide Me, Accu-Chek Guide, Accu-Chek Softclix Lancets, LORazepam,  metoprolol tartrate, Enbrel, methotrexate, meloxicam, rosuvastatin, HYDROcodone-acetaminophen, HYDROcodone-acetaminophen, and folic acid. We administered triamcinolone acetonide, sodium chloride flush, ropivacaine (PF) 2 mg/mL (0.2%), lidocaine (PF), and iohexol.   Meds ordered this encounter  Medications   triamcinolone acetonide (KENALOG-40) injection 40 mg   sodium chloride flush (NS) 0.9 % injection 10 mL   ropivacaine (PF) 2 mg/mL (0.2%) (NAROPIN) injection 10 mL   lidocaine (PF) (XYLOCAINE) 1 % injection 5 mL   iohexol (OMNIPAQUE) 180 MG/ML injection 10 mL   HYDROcodone-acetaminophen (NORCO) 7.5-325 MG tablet    Sig: Take 1 tablet by mouth every 6 (six) hours as needed for moderate  pain or severe pain.    Dispense:  120 tablet    Refill:  0   Patient's Medications  New Prescriptions   No medications on file  Previous Medications   ACCU-CHEK GUIDE TEST STRIP       ACCU-CHEK SOFTCLIX LANCETS LANCETS       ALBUTEROL (VENTOLIN HFA) 108 (90 BASE) MCG/ACT INHALER    Inhale 2 puffs into the lungs every 6 (six) hours as needed for shortness of breath.    BLOOD GLUCOSE MONITORING SUPPL (ACCU-CHEK GUIDE ME) W/DEVICE KIT       CALCIUM CARBONATE-VITAMIN D 600-200 MG-UNIT TABS    Take 1 tablet by mouth daily.    CLOBETASOL PROP EMOLLIENT BASE 0.05 % EMOLLIENT CREAM    APPLY 1 APPLICATION ONCE A WEEK AS NEEDED EXTERNALLY 90 DAYS   DULOXETINE (CYMBALTA) 60 MG CAPSULE    Take 120 mg by mouth daily.    ETANERCEPT (ENBREL) 50 MG/ML INJECTION    Inject 50 mg into the skin once a week.   FLUOCINONIDE (LIDEX) 0.05 % EXTERNAL SOLUTION    Apply 1 application topically 2 (two) times daily as needed (skin irritation).    FOLIC ACID (FOLVITE) 1 MG TABLET    Take 1 tablet by mouth daily.   GLUCOSAMINE-CHONDROITIN (MOVE FREE PO)    Take by mouth daily.   HYDROCODONE-ACETAMINOPHEN (NORCO) 7.5-325 MG TABLET    Take 1 tablet by mouth every 6 (six) hours as needed for moderate pain or severe pain.   LORAZEPAM  (ATIVAN) 0.5 MG TABLET    Take 0.5 mg by mouth daily as needed.   MELOXICAM (MOBIC) 15 MG TABLET    1 tablet   METHOTREXATE 2.5 MG TABLET    8 tabs   METOPROLOL TARTRATE (LOPRESSOR) 25 MG TABLET    Take 0.5 tablets (12.5 mg total) by mouth 2 (two) times daily.   MULTIPLE VITAMIN (MULTI-VITAMIN PO)    Take 1 tablet by mouth daily.    OMEGA-3 FATTY ACIDS (FISH OIL PO)    Take 1,000 mg by mouth daily.    OMEPRAZOLE (PRILOSEC) 40 MG CAPSULE    Take 1 capsule by mouth daily.   OSPHENA 60 MG TABS    Take 1 tablet by mouth daily.   PREDNISONE (DELTASONE) 10 MG TABLET    Take 5 mg by mouth daily with breakfast.    PROBIOTIC PRODUCT (PROBIOTIC DAILY PO)    Take by mouth.   ROSUVASTATIN (CRESTOR) 20 MG TABLET    Take 20 mg by mouth once a week.   VALSARTAN (DIOVAN) 40 MG TABLET    Take 80 mg by mouth daily.  Modified Medications   Modified Medication Previous Medication   HYDROCODONE-ACETAMINOPHEN (NORCO) 7.5-325 MG TABLET HYDROcodone-acetaminophen (NORCO) 7.5-325 MG tablet      Take 1 tablet by mouth every 6 (six) hours as needed for moderate pain or severe pain.    Take 1 tablet by mouth every 6 (six) hours as needed for moderate pain or severe pain.  Discontinued Medications   No medications on file   ----------------------------------------------------------------------------------------------------------------------  Follow-up: Return in about 2 months (around 11/30/2020) for procedure, evaluation.   Procedure: L5-S1 LESI with fluoroscopic guidance and no moderate sedation  NOTE: The risks, benefits, and expectations of the procedure have been discussed and explained to the patient who was understanding and in agreement with suggested treatment plan. No guarantees were made.  DESCRIPTION OF PROCEDURE: Lumbar epidural steroid injection with no IV Versed, EKG, blood pressure, pulse,  and pulse oximetry monitoring. The procedure was performed with the patient in the prone position under fluoroscopic  guidance.  Sterile prep x3 was initiated and I then injected subcutaneous lidocaine to the overlying L5-S1 site after its fluoroscopic identifictation.  Using strict aseptic technique, I then advanced an 18-gauge Tuohy epidural needle in the midline using interlaminar approach via loss-of-resistance to saline technique. There was negative aspiration for heme or  CSF.  I then confirmed position with both AP and Lateral fluoroscan.  2 cc of contrast dye were injected and a  total of 5 mL of Preservative-Free normal saline mixed with 40 mg of Kenalog and 1cc Ropicaine 0.2 percent were injected incrementally via the  epidurally placed needle. The needle was removed. The patient tolerated the injection well and was convalesced and discharged to home in stable condition. Should the patient have any post procedure difficulty they have been instructed on how to contact us for assistance.    Molli Barrows, MD

## 2020-10-01 NOTE — Telephone Encounter (Signed)
Spoke with patient's daughter, she reports no problems post procedure.

## 2020-10-04 DIAGNOSIS — I499 Cardiac arrhythmia, unspecified: Secondary | ICD-10-CM | POA: Diagnosis not present

## 2020-10-04 DIAGNOSIS — R5383 Other fatigue: Secondary | ICD-10-CM | POA: Diagnosis not present

## 2020-10-04 DIAGNOSIS — E890 Postprocedural hypothyroidism: Secondary | ICD-10-CM | POA: Diagnosis not present

## 2020-10-04 DIAGNOSIS — B373 Candidiasis of vulva and vagina: Secondary | ICD-10-CM | POA: Diagnosis not present

## 2020-10-04 DIAGNOSIS — N3000 Acute cystitis without hematuria: Secondary | ICD-10-CM | POA: Diagnosis not present

## 2020-10-04 DIAGNOSIS — E1169 Type 2 diabetes mellitus with other specified complication: Secondary | ICD-10-CM | POA: Diagnosis not present

## 2020-10-15 DIAGNOSIS — M059 Rheumatoid arthritis with rheumatoid factor, unspecified: Secondary | ICD-10-CM | POA: Diagnosis not present

## 2020-10-15 DIAGNOSIS — J449 Chronic obstructive pulmonary disease, unspecified: Secondary | ICD-10-CM | POA: Diagnosis not present

## 2020-10-15 DIAGNOSIS — K219 Gastro-esophageal reflux disease without esophagitis: Secondary | ICD-10-CM | POA: Diagnosis not present

## 2020-10-15 DIAGNOSIS — E1169 Type 2 diabetes mellitus with other specified complication: Secondary | ICD-10-CM | POA: Diagnosis not present

## 2020-10-15 DIAGNOSIS — E785 Hyperlipidemia, unspecified: Secondary | ICD-10-CM | POA: Diagnosis not present

## 2020-10-15 DIAGNOSIS — J441 Chronic obstructive pulmonary disease with (acute) exacerbation: Secondary | ICD-10-CM | POA: Diagnosis not present

## 2020-10-15 DIAGNOSIS — F324 Major depressive disorder, single episode, in partial remission: Secondary | ICD-10-CM | POA: Diagnosis not present

## 2020-10-15 DIAGNOSIS — M47816 Spondylosis without myelopathy or radiculopathy, lumbar region: Secondary | ICD-10-CM | POA: Diagnosis not present

## 2020-10-15 DIAGNOSIS — I10 Essential (primary) hypertension: Secondary | ICD-10-CM | POA: Diagnosis not present

## 2020-10-30 DIAGNOSIS — J449 Chronic obstructive pulmonary disease, unspecified: Secondary | ICD-10-CM | POA: Diagnosis not present

## 2020-10-30 DIAGNOSIS — N3000 Acute cystitis without hematuria: Secondary | ICD-10-CM | POA: Diagnosis not present

## 2020-10-30 DIAGNOSIS — Z20822 Contact with and (suspected) exposure to covid-19: Secondary | ICD-10-CM | POA: Diagnosis not present

## 2020-10-31 DIAGNOSIS — U071 COVID-19: Secondary | ICD-10-CM | POA: Diagnosis not present

## 2020-11-01 ENCOUNTER — Other Ambulatory Visit (HOSPITAL_COMMUNITY): Payer: Self-pay

## 2020-11-01 ENCOUNTER — Ambulatory Visit (INDEPENDENT_AMBULATORY_CARE_PROVIDER_SITE_OTHER): Payer: Medicare HMO | Admitting: *Deleted

## 2020-11-01 DIAGNOSIS — U071 COVID-19: Secondary | ICD-10-CM

## 2020-11-01 MED ORDER — ALBUTEROL SULFATE HFA 108 (90 BASE) MCG/ACT IN AERS: 2.0000 | INHALATION_SPRAY | Freq: Once | RESPIRATORY_TRACT | Status: AC | PRN

## 2020-11-01 MED ORDER — METHYLPREDNISOLONE SODIUM SUCC 125 MG IJ SOLR
125.0000 mg | Freq: Once | INTRAMUSCULAR | Status: AC | PRN
Start: 2020-11-01 — End: 2020-11-01

## 2020-11-01 MED ORDER — FAMOTIDINE IN NACL 20-0.9 MG/50ML-% IV SOLN: 20.0000 mg | Freq: Once | INTRAVENOUS | Status: AC | PRN

## 2020-11-01 MED ORDER — EPINEPHRINE 0.3 MG/0.3ML IJ SOAJ: 0.3000 mg | Freq: Once | INTRAMUSCULAR | Status: AC | PRN

## 2020-11-01 MED ORDER — DIPHENHYDRAMINE HCL 50 MG/ML IJ SOLN: 50.0000 mg | Freq: Once | INTRAMUSCULAR | Status: AC | PRN

## 2020-11-01 MED ORDER — BEBTELOVIMAB 175 MG/2 ML IV (EUA)
175.0000 mg | Freq: Once | INTRAMUSCULAR | Status: AC
  Administered 2020-11-01: 175 mg via INTRAVENOUS

## 2020-11-01 MED ORDER — SODIUM CHLORIDE 0.9 % IV SOLN: INTRAVENOUS | Status: DC | PRN

## 2020-11-01 NOTE — Progress Notes (Signed)
Diagnosis: COVID  Provider:  Marshell Garfinkel, MD  Procedure: Infusion  IV Type: Peripheral, IV Location: L Antecubital  Bebtelovimab, Dose: 175 mg  Infusion Start Time: 1425pm  Infusion Stop Time: 1426pm  Post Infusion IV Care: Observation period completed and Peripheral IV Discontinued  Discharge: Condition: Good, Destination: Home . AVS provided to patient.   Performed by:  Oren Beckmann, RN

## 2020-11-01 NOTE — Patient Instructions (Signed)
10 Things You Can Do to Manage Your COVID-19 Symptoms at Home If you have possible or confirmed COVID-19 Stay home except to get medical care. Monitor your symptoms carefully. If your symptoms get worse, call your healthcare provider immediately. Get rest and stay hydrated. If you have a medical appointment, call the healthcare provider ahead of time and tell them that you have or may have COVID-19. For medical emergencies, call 911 and notify the dispatch personnel that you have or may have COVID-19. Cover your cough and sneezes with a tissue or use the inside of your elbow. Wash your hands often with soap and water for at least 20 seconds or clean your hands with an alcohol-based hand sanitizer that contains at least 60% alcohol. As much as possible, stay in a specific room and away from other people in your home. Also, you should use a separate bathroom, if available. If you need to be around other people in or outside of the home, wear a mask. Avoid sharing personal items with other people in your household, like dishes, towels, and bedding. Clean all surfaces that are touched often, like counters, tabletops, and doorknobs. Use household cleaning sprays or wipes according to the label instructions. [email or web address] What types of side effects do monoclonal antibody drugs cause?  Common side effects  In general, the more common side effects caused by monoclonal antibody drugs include: Allergic reactions, such as hives or itching Flu-like signs and symptoms, including chills, fatigue, fever, and muscle aches and pains Nausea, vomiting Diarrhea Skin rashes Low blood pressure   The CDC is recommending patients who receive monoclonal antibody treatments wait at least 90 days before being vaccinated.  Currently, there are no data on the safety and efficacy of mRNA COVID-19 vaccines in persons who received monoclonal antibodies or convalescent plasma as part of COVID-19 treatment. Based on  the estimated half-life of such therapies as well as evidence suggesting that reinfection is uncommon in the 90 days after initial infection, vaccination should be deferred for at least 90 days, as a precautionary measure until additional information becomes available, to avoid interference of the antibody treatment with vaccine-induced immune responses.  09/08/2019 This information is not intended to replace advice given to you by your health care provider. Make sure you discuss any questions you have with your health care provider. Document Revised: 06/27/2020 Document Reviewed: 06/27/2020 Elsevier Patient Education  [place].

## 2020-11-14 DIAGNOSIS — E1169 Type 2 diabetes mellitus with other specified complication: Secondary | ICD-10-CM | POA: Diagnosis not present

## 2020-11-14 DIAGNOSIS — K219 Gastro-esophageal reflux disease without esophagitis: Secondary | ICD-10-CM | POA: Diagnosis not present

## 2020-11-14 DIAGNOSIS — J449 Chronic obstructive pulmonary disease, unspecified: Secondary | ICD-10-CM | POA: Diagnosis not present

## 2020-11-14 DIAGNOSIS — E785 Hyperlipidemia, unspecified: Secondary | ICD-10-CM | POA: Diagnosis not present

## 2020-11-14 DIAGNOSIS — I10 Essential (primary) hypertension: Secondary | ICD-10-CM | POA: Diagnosis not present

## 2020-11-14 DIAGNOSIS — E890 Postprocedural hypothyroidism: Secondary | ICD-10-CM | POA: Diagnosis not present

## 2020-11-14 DIAGNOSIS — M059 Rheumatoid arthritis with rheumatoid factor, unspecified: Secondary | ICD-10-CM | POA: Diagnosis not present

## 2020-11-14 DIAGNOSIS — F324 Major depressive disorder, single episode, in partial remission: Secondary | ICD-10-CM | POA: Diagnosis not present

## 2020-11-14 DIAGNOSIS — J441 Chronic obstructive pulmonary disease with (acute) exacerbation: Secondary | ICD-10-CM | POA: Diagnosis not present

## 2020-11-21 ENCOUNTER — Encounter: Payer: Self-pay | Admitting: Anesthesiology

## 2020-11-21 ENCOUNTER — Ambulatory Visit: Payer: Medicare HMO | Attending: Anesthesiology | Admitting: Anesthesiology

## 2020-11-21 ENCOUNTER — Other Ambulatory Visit: Payer: Self-pay

## 2020-11-21 DIAGNOSIS — M5431 Sciatica, right side: Secondary | ICD-10-CM | POA: Diagnosis not present

## 2020-11-21 DIAGNOSIS — G894 Chronic pain syndrome: Secondary | ICD-10-CM | POA: Diagnosis not present

## 2020-11-21 DIAGNOSIS — M0579 Rheumatoid arthritis with rheumatoid factor of multiple sites without organ or systems involvement: Secondary | ICD-10-CM

## 2020-11-21 DIAGNOSIS — F1190 Opioid use, unspecified, uncomplicated: Secondary | ICD-10-CM | POA: Diagnosis not present

## 2020-11-21 DIAGNOSIS — M4716 Other spondylosis with myelopathy, lumbar region: Secondary | ICD-10-CM | POA: Diagnosis not present

## 2020-11-21 DIAGNOSIS — M25551 Pain in right hip: Secondary | ICD-10-CM

## 2020-11-21 DIAGNOSIS — M5432 Sciatica, left side: Secondary | ICD-10-CM

## 2020-11-21 DIAGNOSIS — M47817 Spondylosis without myelopathy or radiculopathy, lumbosacral region: Secondary | ICD-10-CM | POA: Diagnosis not present

## 2020-11-21 DIAGNOSIS — M79641 Pain in right hand: Secondary | ICD-10-CM

## 2020-11-21 DIAGNOSIS — M25552 Pain in left hip: Secondary | ICD-10-CM

## 2020-11-21 DIAGNOSIS — M797 Fibromyalgia: Secondary | ICD-10-CM

## 2020-11-21 DIAGNOSIS — M79642 Pain in left hand: Secondary | ICD-10-CM

## 2020-11-21 DIAGNOSIS — M5136 Other intervertebral disc degeneration, lumbar region: Secondary | ICD-10-CM

## 2020-11-21 DIAGNOSIS — M51369 Other intervertebral disc degeneration, lumbar region without mention of lumbar back pain or lower extremity pain: Secondary | ICD-10-CM

## 2020-11-21 MED ORDER — HYDROCODONE-ACETAMINOPHEN 7.5-325 MG PO TABS: 1.0000 | ORAL_TABLET | Freq: Four times a day (QID) | ORAL | 0 refills | Status: DC | PRN

## 2020-11-21 NOTE — Progress Notes (Signed)
Virtual Visit via Telephone Note  I connected with Kelsey Diaz on 11/21/20 at  1:55 PM EDT by telephone and verified that I am speaking with the correct person using two identifiers.  Location: Patient: Home Provider: Pain control center   I discussed the limitations, risks, security and privacy concerns of performing an evaluation and management service by telephone and the availability of in person appointments. I also discussed with the patient that there may be a patient responsible charge related to this service. The patient expressed understanding and agreed to proceed.   History of Present Illness: I spoke with Kelsey Diaz today via telephone as we were unable to link for the video portion of this conference but she reports that her recent July epidural was effective for her.  She got approximately 75% improvement lasting about 2 months but she has begun to have some recurrence of the pain in the lower extremities as previously described.  She generally gets 2 months of relief but unfortunately she has recently acquired COVID and is suffering from rest and trying to get better.  She feels that this has taxed her low back and cause some recurrence of pain.  She has been less active also the placement of this.  She is taking her medications as prescribed and continues to get good relief from these and continues to derive good functional lifestyle improvement and better sleep with the medicines.  Otherwise she is in her usual state of health and any changes in the quality characteristic or distribution of her pain or problems with lower extremity strength or function or bowel or bladder function.  Review of systems: General: No fevers or chills Pulmonary: No shortness of breath or dyspnea Cardiac: No angina or palpitations or lightheadedness GI: No abdominal pain or constipation Psych: No depression    Observations/Objective:   Current Outpatient Medications:    [START ON  12/21/2020] HYDROcodone-acetaminophen (NORCO) 7.5-325 MG tablet, Take 1 tablet by mouth every 6 (six) hours as needed for moderate pain or severe pain., Disp: 120 tablet, Rfl: 0   ACCU-CHEK GUIDE test strip, , Disp: , Rfl:    Accu-Chek Softclix Lancets lancets, , Disp: , Rfl:    albuterol (VENTOLIN HFA) 108 (90 Base) MCG/ACT inhaler, Inhale 2 puffs into the lungs every 6 (six) hours as needed for shortness of breath. , Disp: , Rfl:    Blood Glucose Monitoring Suppl (ACCU-CHEK GUIDE ME) w/Device KIT, , Disp: , Rfl:    Calcium Carbonate-Vitamin D 600-200 MG-UNIT TABS, Take 1 tablet by mouth daily. , Disp: , Rfl:    Clobetasol Prop Emollient Base 0.05 % emollient cream, APPLY 1 APPLICATION ONCE A WEEK AS NEEDED EXTERNALLY 90 DAYS, Disp: , Rfl:    DULoxetine (CYMBALTA) 60 MG capsule, Take 120 mg by mouth daily. , Disp: , Rfl:    etanercept (ENBREL) 50 MG/ML injection, Inject 50 mg into the skin once a week., Disp: , Rfl:    fluocinonide (LIDEX) 0.05 % external solution, Apply 1 application topically 2 (two) times daily as needed (skin irritation). , Disp: , Rfl:    folic acid (FOLVITE) 1 MG tablet, Take 1 tablet by mouth daily., Disp: , Rfl:    Glucosamine-Chondroitin (MOVE FREE PO), Take by mouth daily., Disp: , Rfl:    [START ON 11/21/2021] HYDROcodone-acetaminophen (NORCO) 7.5-325 MG tablet, Take 1 tablet by mouth every 6 (six) hours as needed for moderate pain or severe pain., Disp: 120 tablet, Rfl: 0   LORazepam (ATIVAN) 0.5 MG  tablet, Take 0.5 mg by mouth daily as needed., Disp: , Rfl:    meloxicam (MOBIC) 15 MG tablet, 1 tablet, Disp: , Rfl:    methotrexate 2.5 MG tablet, 8 tabs, Disp: , Rfl:    metoprolol tartrate (LOPRESSOR) 25 MG tablet, Take 0.5 tablets (12.5 mg total) by mouth 2 (two) times daily., Disp: 90 tablet, Rfl: 3   Multiple Vitamin (MULTI-VITAMIN PO), Take 1 tablet by mouth daily. , Disp: , Rfl:    Omega-3 Fatty Acids (FISH OIL PO), Take 1,000 mg by mouth daily. , Disp: , Rfl:     omeprazole (PRILOSEC) 40 MG capsule, Take 1 capsule by mouth daily., Disp: , Rfl: 1   OSPHENA 60 MG TABS, Take 1 tablet by mouth daily., Disp: , Rfl:    predniSONE (DELTASONE) 10 MG tablet, Take 5 mg by mouth daily with breakfast. , Disp: , Rfl:    Probiotic Product (PROBIOTIC DAILY PO), Take by mouth. (Patient not taking: No sig reported), Disp: , Rfl:    rosuvastatin (CRESTOR) 20 MG tablet, Take 20 mg by mouth once a week., Disp: , Rfl:    valsartan (DIOVAN) 40 MG tablet, Take 80 mg by mouth daily., Disp: , Rfl:   Current Facility-Administered Medications:    0.9 %  sodium chloride infusion, , Intravenous, PRN, Caren Macadam, MD  Assessment and Plan: 1. DDD (degenerative disc disease), lumbar   2. Bilateral sciatica   3. Bilateral hip pain   4. Lumbar spondylosis with myelopathy   5. Chronic, continuous use of opioids   6. Fibromyalgia   7. Chronic pain syndrome   8. Rheumatoid arthritis involving multiple sites with positive rheumatoid factor ([place])   9. Facet arthritis of lumbosacral region   10. Pain in both hands    Is based on her discussion today and upon review of the [facility] practitioner database information going to refill her medications.  She is currently overdue.  These will be sent in for September 29 and October 29.  We will schedule her for 6 to 8-week returns and proceed with a repeat epidural for the sciatica symptoms that have recurred in addition we are requesting a routine urine drug screen.  No other changes are made today.  I want her to continue follow-up with her primary care physicians for baseline medical care.  Follow Up Instructions:    I discussed the assessment and treatment plan with the patient. The patient was provided an opportunity to ask questions and all were answered. The patient agreed with the plan and demonstrated an understanding of the instructions.   The patient was advised to call back or seek an in-person evaluation if the symptoms  worsen or if the condition fails to improve as anticipated.  I provided 30 minutes of non-face-to-face time during this encounter.   Molli Barrows, MD

## 2020-11-22 ENCOUNTER — Telehealth: Payer: Self-pay

## 2020-11-22 NOTE — Telephone Encounter (Signed)
LM for patient to call office for pre procedure instructions.  No blood thinners. Unsure if patient will receive sedation.  Please check with patient and have nurse give pre procedure instructions when you schedule.

## 2020-11-26 ENCOUNTER — Telehealth: Payer: Self-pay | Admitting: *Deleted

## 2020-11-26 MED ORDER — HYDROCODONE-ACETAMINOPHEN 7.5-325 MG PO TABS: 1.0000 | ORAL_TABLET | Freq: Four times a day (QID) | ORAL | 0 refills | Status: DC | PRN

## 2020-11-26 NOTE — Telephone Encounter (Signed)
Received email from pharmacy for missing/illegible information on a Rx.  The problem is that the Rx has a fill date of 11/10/21.  There is also another Rx that is written for October 2023.  I told pharmacy that I would let Dr Andree Elk know so that he could correct the problem.  My question is why there is an Rx for 11/10/20 and 11/21/21.    I have left voicemail for JA and let him know about the issue that I have sent this inbox to him.    Last DOS 11/21/20

## 2020-11-26 NOTE — Addendum Note (Signed)
Addended by: Molli Barrows on: 11/26/2020 05:12 PM   Modules accepted: Orders

## 2020-11-30 IMAGING — CT CT CHEST HIGH RESOLUTION W/O CM
2 of 7 series · 14 of 36 positions shown, 17 images · non-contrast
Comparison: No priors.

CLINICAL DATA: 73-year-old female with history of shortness of
breath. History of rheumatoid arthritis with use of methotrexate.

EXAM:
CT CHEST WITHOUT CONTRAST
TECHNIQUE: Multidetector CT imaging of the chest was performed following the
standard protocol without intravenous contrast. High resolution
imaging of the lungs, as well as inspiratory and expiratory imaging,
was performed.

[Series 4: high resolution · axial · 0.72mm/px · z∈[-348,-100]mm · 11 of 298 slices shown, 14 images]
[im 25/298  mediastinal]
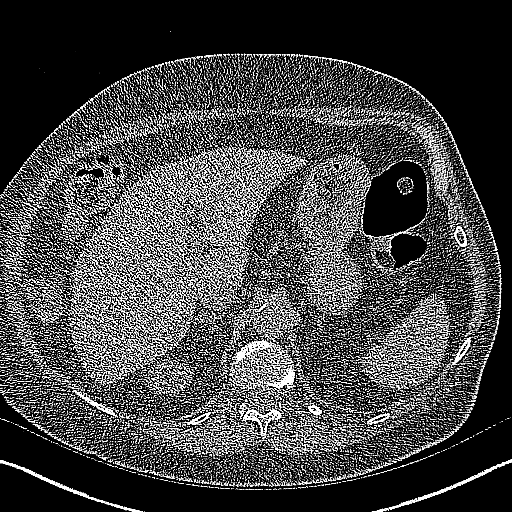
[im 25/298  lung]
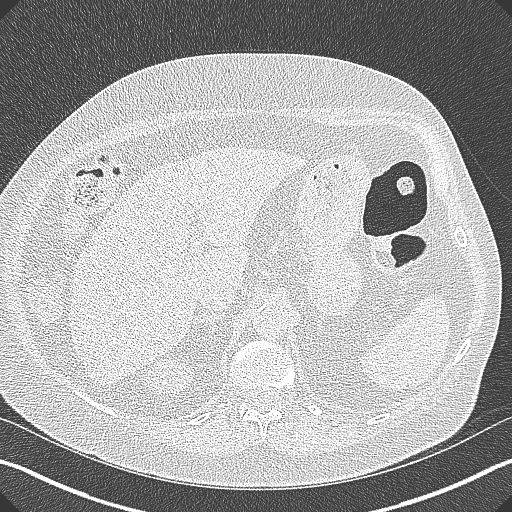
[im 50/298  lung]
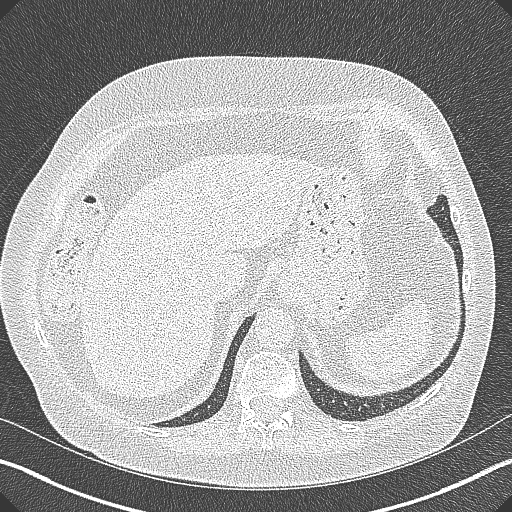
[im 75/298  lung]
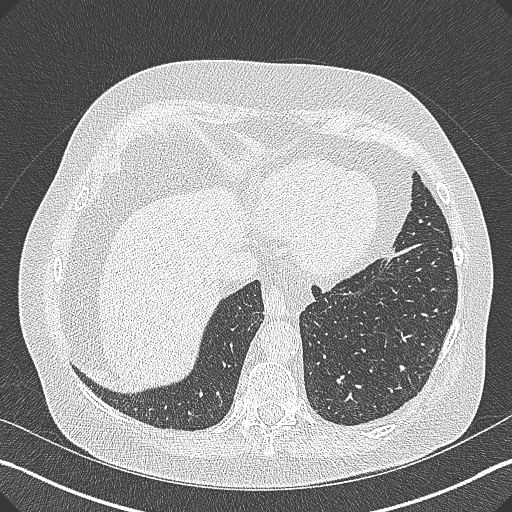
[im 100/298  lung]
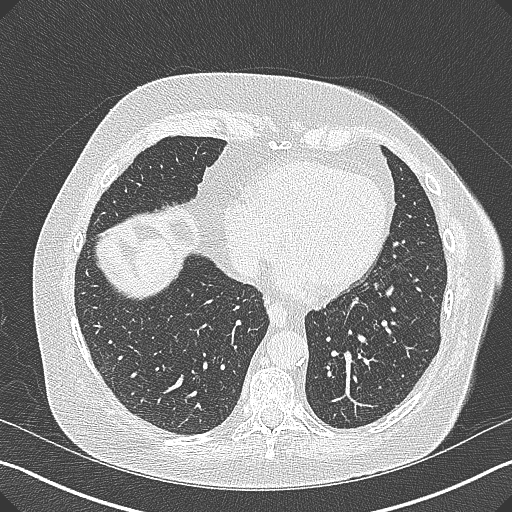
[im 124/298  mediastinal]
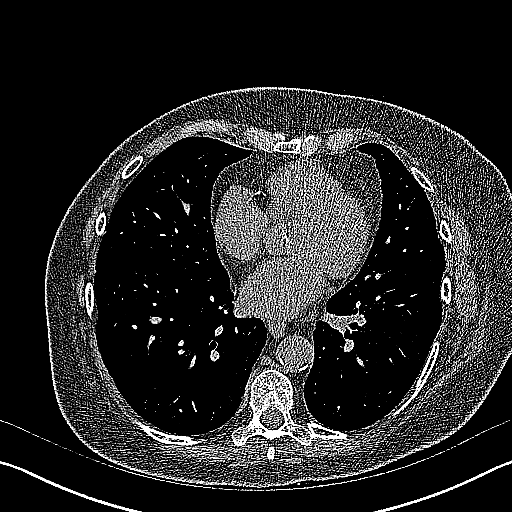
[im 124/298  lung]
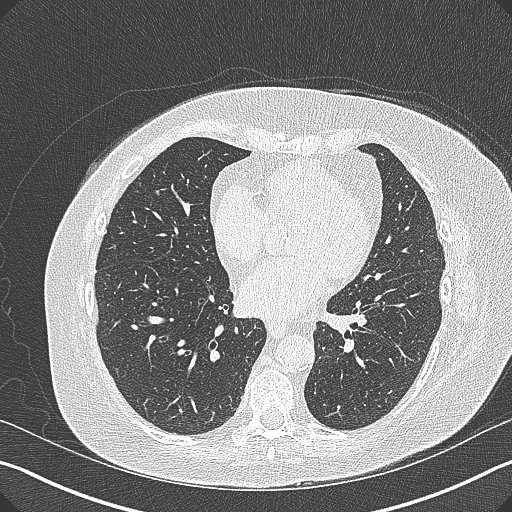
[im 149/298  lung]
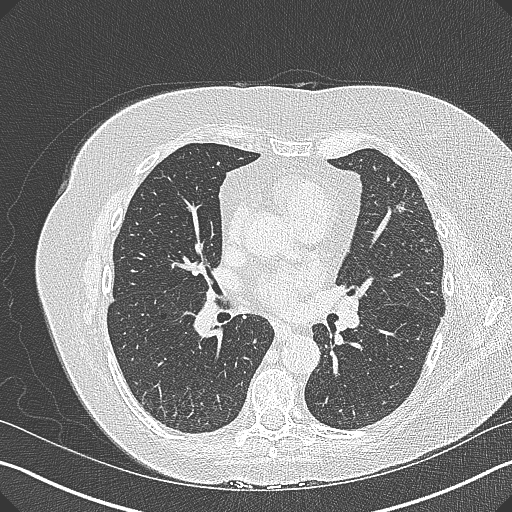
[im 174/298  lung]
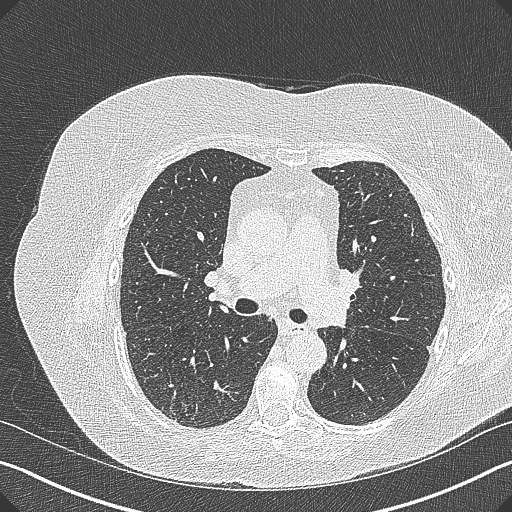
[im 199/298  lung]
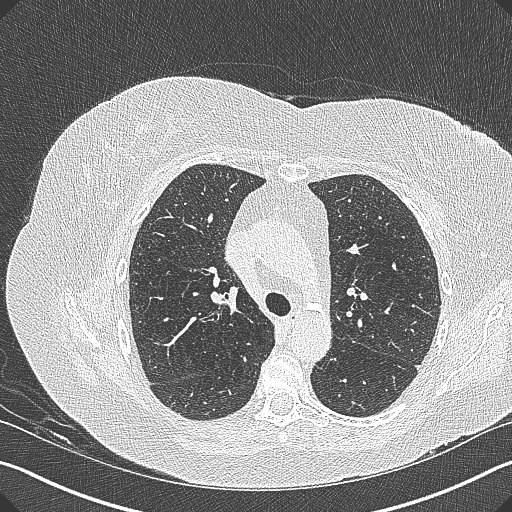
[im 223/298  mediastinal]
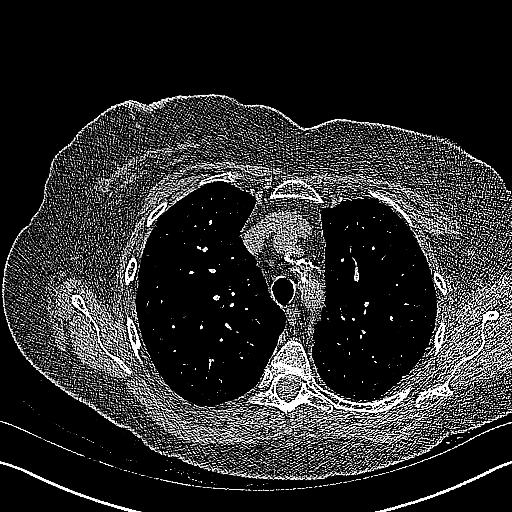
[im 223/298  lung]
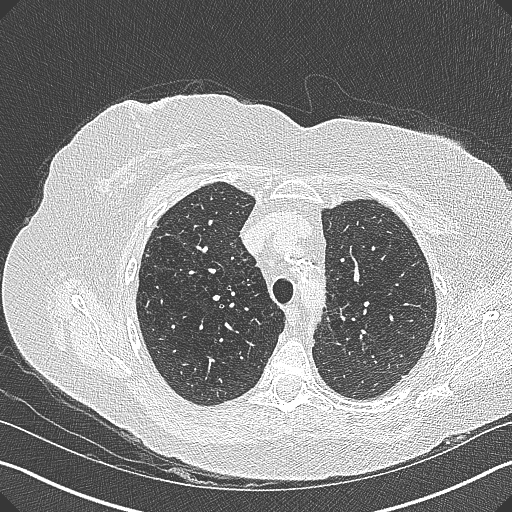
[im 248/298  lung]
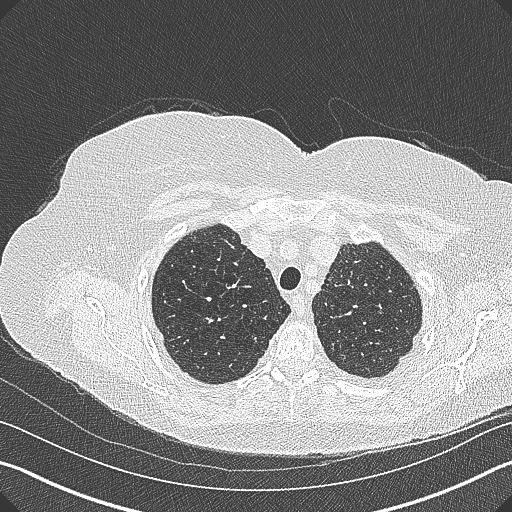
[im 273/298  lung]
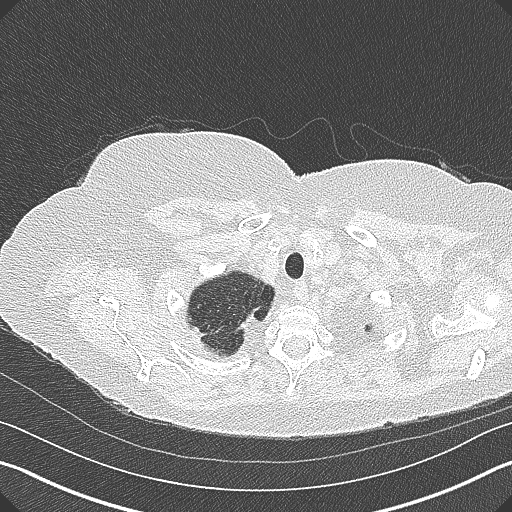

[Series 6: coronal · coronal · 0.60mm/px · 3 of 131 slices shown]
[im 27/131  lung]
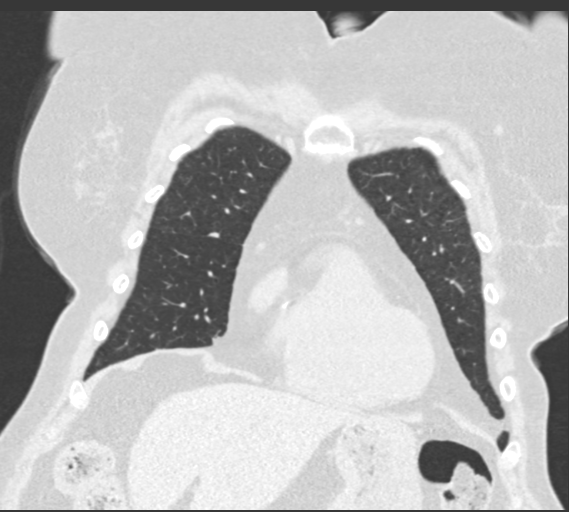
[im 53/131  lung]
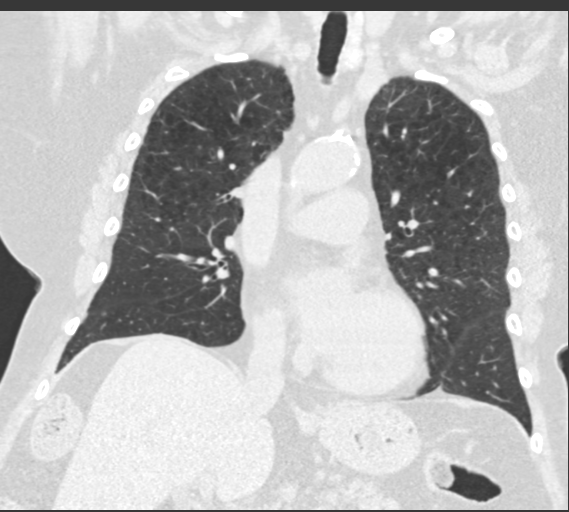
[im 79/131  lung]
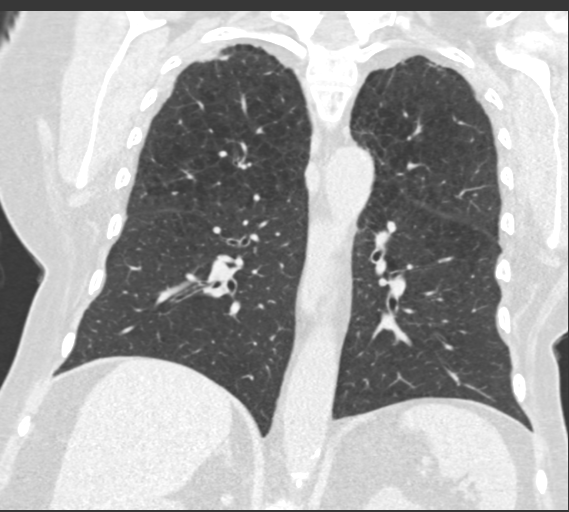

[14 of 36 positions shown; findings below may reference images not displayed]

FINDINGS: Cardiovascular: Heart size is normal. There is no significant
pericardial fluid, thickening or pericardial calcification. There is
aortic atherosclerosis, as well as atherosclerosis of the great
vessels of the mediastinum and the coronary arteries, including
calcified atherosclerotic plaque in the left main, left anterior
descending and right coronary arteries. Calcifications of the aortic
valve.

Mediastinum/Nodes: No pathologically enlarged mediastinal or hilar
lymph nodes. Please note that accurate exclusion of hilar adenopathy
is limited on noncontrast CT scans. Small hiatal hernia. No axillary
lymphadenopathy.

Lungs/Pleura: High-resolution images demonstrate no significant
regions of ground-glass attenuation, septal thickening, subpleural
reticulation, parenchymal banding, traction bronchiectasis or frank
honeycombing. There is some bilateral apical nodular
pleuroparenchymal thickening and architectural distortion, most
compatible with chronic post infectious or inflammatory scarring.
Inspiratory and expiratory imaging is unremarkable in appearance.
Diffuse bronchial wall thickening with mild to moderate
centrilobular and paraseptal emphysema. No acute consolidative
airspace disease. No pleural effusions.

Upper Abdomen: Aortic atherosclerosis.

Musculoskeletal: There are no aggressive appearing lytic or blastic
lesions noted in the visualized portions of the skeleton.
IMPRESSION: 1. No findings to suggest interstitial lung disease.
2. Mild diffuse bronchial wall thickening with mild to moderate
centrilobular and paraseptal emphysema.
3. Aortic atherosclerosis, in addition to left main and 2 vessel
coronary artery disease. Assessment for potential risk factor
modification, dietary therapy or pharmacologic therapy may be
warranted, if clinically indicated.
4. There are calcifications of the aortic valve. Echocardiographic
correlation for evaluation of potential valvular dysfunction may be
warranted if clinically indicated.

Aortic Atherosclerosis (AIS4I-13D.D) and Emphysema (AIS4I-PIN.2).

## 2020-12-10 ENCOUNTER — Telehealth: Payer: Self-pay | Admitting: Cardiology

## 2020-12-10 NOTE — Telephone Encounter (Signed)
Called patient- spoke to her and daughter- she states that the last 1-2 weeks it has worsened but she feels like she is going to pass out. It has been worse last night and today. She states she has not taken her Valsartan, she did take her 12.5 mg Metoprolol- blood pressure while sitting just now was 96/67, she tried to stand and take BP while standing and machine would not read. I suspect she is orthostatic- she was advised to try to eat a small something to get BP up- and not to take other dose of Metoprolol for now until we hear from MD. Patient verbalized understanding. Blood sugar was 159. She states that normally her blood pressures are 122-124/69-68.  She does get dizzy at times while sitting but not all the time, she mostly feels bad and like she will fall out while standing or doing any activities, she can hardly stand for long times. I advised I would route a message to MD and PHARMD to advise- they were given ED precautions and when to call 911 for EMS.

## 2020-12-10 NOTE — Telephone Encounter (Signed)
Recommend patient d/c valsartan at this time and continue to monitor BP at home.  Recommend orthostatic precautions (increase fluid intake, rising slowly from a laying down and/or sitting position, consider compression stockings) to see if this helps

## 2020-12-10 NOTE — Telephone Encounter (Signed)
Pt c/o Syncope: STAT if syncope occurred within 30 minutes and pt complains of lightheadedness High Priority if episode of passing out, completely, today or in last 24 hours   Did you pass out today? Yes    When is the last time you passed out? This morning   Has this occurred multiple times? Yes    Did you have any symptoms prior to passing out? Funny in chest, lightheadedness, dizziness, nauseated, cold sweat, low blood pressure

## 2020-12-10 NOTE — Telephone Encounter (Signed)
Called patient, advised okay to stop valsartan per [facility].   Patient verbalized understanding.

## 2020-12-11 ENCOUNTER — Emergency Department (HOSPITAL_COMMUNITY): Payer: Medicare HMO

## 2020-12-11 ENCOUNTER — Other Ambulatory Visit: Payer: Self-pay

## 2020-12-11 ENCOUNTER — Inpatient Hospital Stay (HOSPITAL_COMMUNITY)
Admission: EM | Admit: 2020-12-11 | Discharge: 2020-12-17 | DRG: 287 | Disposition: A | Discharge status: Home / Self Care | Payer: Medicare HMO | Attending: Cardiology | Admitting: Cardiology

## 2020-12-11 DIAGNOSIS — I499 Cardiac arrhythmia, unspecified: Secondary | ICD-10-CM | POA: Diagnosis not present

## 2020-12-11 DIAGNOSIS — Z833 Family history of diabetes mellitus: Secondary | ICD-10-CM | POA: Diagnosis not present

## 2020-12-11 DIAGNOSIS — E1169 Type 2 diabetes mellitus with other specified complication: Secondary | ICD-10-CM | POA: Diagnosis not present

## 2020-12-11 DIAGNOSIS — E876 Hypokalemia: Secondary | ICD-10-CM | POA: Diagnosis present

## 2020-12-11 DIAGNOSIS — K219 Gastro-esophageal reflux disease without esophagitis: Secondary | ICD-10-CM | POA: Diagnosis present

## 2020-12-11 DIAGNOSIS — I493 Ventricular premature depolarization: Secondary | ICD-10-CM | POA: Diagnosis present

## 2020-12-11 DIAGNOSIS — R55 Syncope and collapse: Secondary | ICD-10-CM

## 2020-12-11 DIAGNOSIS — Z791 Long term (current) use of non-steroidal anti-inflammatories (NSAID): Secondary | ICD-10-CM | POA: Diagnosis not present

## 2020-12-11 DIAGNOSIS — Z7952 Long term (current) use of systemic steroids: Secondary | ICD-10-CM | POA: Diagnosis not present

## 2020-12-11 DIAGNOSIS — I10 Essential (primary) hypertension: Secondary | ICD-10-CM | POA: Diagnosis not present

## 2020-12-11 DIAGNOSIS — R000 Tachycardia, unspecified: Secondary | ICD-10-CM | POA: Diagnosis not present

## 2020-12-11 DIAGNOSIS — M797 Fibromyalgia: Secondary | ICD-10-CM | POA: Diagnosis not present

## 2020-12-11 DIAGNOSIS — J449 Chronic obstructive pulmonary disease, unspecified: Secondary | ICD-10-CM | POA: Diagnosis not present

## 2020-12-11 DIAGNOSIS — M069 Rheumatoid arthritis, unspecified: Secondary | ICD-10-CM | POA: Diagnosis not present

## 2020-12-11 DIAGNOSIS — R42 Dizziness and giddiness: Secondary | ICD-10-CM | POA: Diagnosis not present

## 2020-12-11 DIAGNOSIS — Z9842 Cataract extraction status, left eye: Secondary | ICD-10-CM

## 2020-12-11 DIAGNOSIS — I491 Atrial premature depolarization: Secondary | ICD-10-CM | POA: Diagnosis not present

## 2020-12-11 DIAGNOSIS — Z961 Presence of intraocular lens: Secondary | ICD-10-CM | POA: Diagnosis present

## 2020-12-11 DIAGNOSIS — Z87891 Personal history of nicotine dependence: Secondary | ICD-10-CM

## 2020-12-11 DIAGNOSIS — I4729 Other ventricular tachycardia: Secondary | ICD-10-CM | POA: Diagnosis not present

## 2020-12-11 DIAGNOSIS — Z8249 Family history of ischemic heart disease and other diseases of the circulatory system: Secondary | ICD-10-CM

## 2020-12-11 DIAGNOSIS — G4733 Obstructive sleep apnea (adult) (pediatric): Secondary | ICD-10-CM | POA: Diagnosis not present

## 2020-12-11 DIAGNOSIS — E785 Hyperlipidemia, unspecified: Secondary | ICD-10-CM | POA: Diagnosis not present

## 2020-12-11 DIAGNOSIS — R0902 Hypoxemia: Secondary | ICD-10-CM | POA: Diagnosis not present

## 2020-12-11 DIAGNOSIS — Z888 Allergy status to other drugs, medicaments and biological substances status: Secondary | ICD-10-CM | POA: Diagnosis not present

## 2020-12-11 DIAGNOSIS — Z9841 Cataract extraction status, right eye: Secondary | ICD-10-CM | POA: Diagnosis not present

## 2020-12-11 DIAGNOSIS — M059 Rheumatoid arthritis with rheumatoid factor, unspecified: Secondary | ICD-10-CM | POA: Diagnosis not present

## 2020-12-11 DIAGNOSIS — I358 Other nonrheumatic aortic valve disorders: Secondary | ICD-10-CM | POA: Diagnosis not present

## 2020-12-11 DIAGNOSIS — I472 Ventricular tachycardia, unspecified: Secondary | ICD-10-CM | POA: Diagnosis not present

## 2020-12-11 DIAGNOSIS — Z79899 Other long term (current) drug therapy: Secondary | ICD-10-CM

## 2020-12-11 DIAGNOSIS — I5030 Unspecified diastolic (congestive) heart failure: Secondary | ICD-10-CM | POA: Diagnosis not present

## 2020-12-11 DIAGNOSIS — Z789 Other specified health status: Secondary | ICD-10-CM | POA: Diagnosis not present

## 2020-12-11 DIAGNOSIS — Z8616 Personal history of COVID-19: Secondary | ICD-10-CM | POA: Diagnosis not present

## 2020-12-11 DIAGNOSIS — J441 Chronic obstructive pulmonary disease with (acute) exacerbation: Secondary | ICD-10-CM | POA: Diagnosis not present

## 2020-12-11 DIAGNOSIS — Z885 Allergy status to narcotic agent status: Secondary | ICD-10-CM

## 2020-12-11 DIAGNOSIS — E890 Postprocedural hypothyroidism: Secondary | ICD-10-CM | POA: Diagnosis not present

## 2020-12-11 DIAGNOSIS — Z825 Family history of asthma and other chronic lower respiratory diseases: Secondary | ICD-10-CM

## 2020-12-11 DIAGNOSIS — U071 COVID-19: Secondary | ICD-10-CM | POA: Diagnosis not present

## 2020-12-11 DIAGNOSIS — I4891 Unspecified atrial fibrillation: Secondary | ICD-10-CM | POA: Diagnosis not present

## 2020-12-11 DIAGNOSIS — I2510 Atherosclerotic heart disease of native coronary artery without angina pectoris: Secondary | ICD-10-CM | POA: Diagnosis not present

## 2020-12-11 LAB — BASIC METABOLIC PANEL
Anion gap: 10 (ref 5–15)
BUN: 13 mg/dL (ref 8–23)
CO2: 23 mmol/L (ref 22–32)
Calcium: 8.9 mg/dL (ref 8.9–10.3)
Chloride: 106 mmol/L (ref 98–111)
Creatinine, Ser: 0.78 mg/dL (ref 0.44–1.00)
GFR, Estimated: >60 mL/min (ref >60)
Glucose, Bld: 142 mg/dL — ABNORMAL HIGH (ref 70–99)
Potassium: 3.8 mmol/L (ref 3.5–5.1)
Sodium: 139 mmol/L (ref 135–145)

## 2020-12-11 LAB — HEPATIC FUNCTION PANEL
ALT: 12 U/L (ref 0–44)
AST: 16 U/L (ref 15–41)
Albumin: 3.2 g/dL — ABNORMAL LOW (ref 3.5–5.0)
Alkaline Phosphatase: 43 U/L (ref 38–126)
Bilirubin, Direct: 0.2 mg/dL (ref 0.0–0.2)
Indirect Bilirubin: 0.8 mg/dL (ref 0.3–0.9)
Total Bilirubin: 1 mg/dL (ref 0.3–1.2)
Total Protein: 5.6 g/dL — ABNORMAL LOW (ref 6.5–8.1)

## 2020-12-11 LAB — PROTIME-INR
INR: 1 (ref 0.8–1.2)
Prothrombin Time: 13 seconds (ref 11.4–15.2)

## 2020-12-11 LAB — APTT: aPTT: 30 seconds (ref 24–36)

## 2020-12-11 LAB — RESP PANEL BY RT-PCR (FLU A&B, COVID) ARPGX2
Influenza A by PCR: NEGATIVE
Influenza B by PCR: NEGATIVE
SARS Coronavirus 2 by RT PCR: POSITIVE — AB

## 2020-12-11 LAB — CBC
HCT: 44 % (ref 36.0–46.0)
Hemoglobin: 14.5 g/dL (ref 12.0–15.0)
MCH: 31.3 pg (ref 26.0–34.0)
MCHC: 33 g/dL (ref 30.0–36.0)
MCV: 94.8 fL (ref 80.0–100.0)
Platelets: 150 10*3/uL (ref 150–400)
RBC: 4.64 MIL/uL (ref 3.87–5.11)
RDW: 12.2 % (ref 11.5–15.5)
WBC: 5.2 10*3/uL (ref 4.0–10.5)
nRBC: 0 % (ref 0.0–0.2)

## 2020-12-11 LAB — TROPONIN I (HIGH SENSITIVITY)
Troponin I (High Sensitivity): 15 ng/L (ref <18)
Troponin I (High Sensitivity): 33 ng/L — ABNORMAL HIGH (ref <18)

## 2020-12-11 LAB — HEMOGLOBIN A1C
Hgb A1c MFr Bld: 6.1 % — ABNORMAL HIGH (ref 4.8–5.6)
Mean Plasma Glucose: 128.37 mg/dL

## 2020-12-11 LAB — LIPID PANEL
Cholesterol: 113 mg/dL (ref 0–200)
HDL: 49 mg/dL (ref >40)
LDL Cholesterol: 39 mg/dL (ref 0–99)
Total CHOL/HDL Ratio: 2.3 RATIO
Triglycerides: 127 mg/dL (ref <150)
VLDL: 25 mg/dL (ref 0–40)

## 2020-12-11 LAB — MAGNESIUM: Magnesium: 1.7 mg/dL (ref 1.7–2.4)

## 2020-12-11 LAB — TSH: TSH: 0.054 u[IU]/mL — ABNORMAL LOW (ref 0.350–4.500)

## 2020-12-11 MED ORDER — AMIODARONE HCL IN DEXTROSE 360-4.14 MG/200ML-% IV SOLN
30.0000 mg/h | INTRAVENOUS | Status: DC
  Administered 2020-12-12 (×3): 30 mg/h via INTRAVENOUS
  Filled 2020-12-11 (×4): qty 200

## 2020-12-11 MED ORDER — ENOXAPARIN SODIUM 40 MG/0.4ML IJ SOSY
40.0000 mg | PREFILLED_SYRINGE | INTRAMUSCULAR | Status: DC
  Administered 2020-12-11 – 2020-12-16 (×6): 40 mg via SUBCUTANEOUS
  Filled 2020-12-11 (×6): qty 0.4

## 2020-12-11 MED ORDER — AMIODARONE HCL IN DEXTROSE 360-4.14 MG/200ML-% IV SOLN
60.0000 mg/h | INTRAVENOUS | Status: AC
  Administered 2020-12-11: 60 mg/h via INTRAVENOUS
  Filled 2020-12-11: qty 200

## 2020-12-11 NOTE — ED Notes (Signed)
Admitting MD ( Dr. Conley Canal) notified on patient's Troponin result. No new orders received .

## 2020-12-11 NOTE — ED Triage Notes (Signed)
Pt here via EMS from home with c/o generalized weakness X1 week. States she would feel this weird sensation in chest. Recently over covid X3 weeks. Diarrhea X2weeks. Had syncopal episode this am. Upon EMS arrival pt was in Vtach (196 HR)Pt pale and diaphoretic. Gave 150mg  Amiodarone with relief to NSR.  Alert and oriented X4  140/82 manual EMS 99 HR RR 20 SpO2 96 RA CBG 190  22gRhand 20 Lhand

## 2020-12-11 NOTE — ED Notes (Signed)
Paged attending 

## 2020-12-11 NOTE — ED Provider Notes (Signed)
[facility] Provider Note   CSN: [id number] Arrival date & time: 12/11/20  1812     History Chief Complaint  Patient presents with   Weakness    Covid+    BLAYNE GARLICK is a 75 y.o. female.   Weakness Associated symptoms: diarrhea   Associated symptoms: no abdominal pain, no arthralgias, no chest pain, no cough, no dysuria, no fever, no seizures, no shortness of breath and no vomiting    75 year old female with a history of COPD, fibromyalgia, GERD, HTN, hypothyroidism, OSA, recent COVID-positive test 3 weeks ago with residual diarrhea for the past 2 weeks who presents emergency department after a syncopal episode at home.  EMS provided majority of the patient HPI.  On EMS arrival, the patient was found to be in V. tach at a rate of 196.  The patient was pale diaphoretic after her syncopal episode.  IV access was obtained and the patient was administered 150 mg amiodarone bolus with subsequent conversion to normal sinus rhythm.  The patient remained pained hemodynamic stability throughout the episode.  No further episodes of ventricular tachycardia.  The patient was transported to [facility] health where she arrived ABC intact, hemodynamically stable.  Past Medical History:  Diagnosis Date   Allergic rhinitis    Anxiety    Asthma    Bilateral hip pain 11/28/2018   Colitis    COPD (chronic obstructive pulmonary disease) ([place])    Fibromyalgia 05/16/2019   GERD (gastroesophageal reflux disease)    With esophageal strictures   Hypertension    Hyperthyroidism    OSA (obstructive sleep apnea) 02/09/2012   No mask.  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    Past Surgical History:  Procedure Laterality Date   BREAST BIOPSY     x2   CATARACT EXTRACTION W/PHACO Left 09/27/2018   Procedure: CATARACT EXTRACTION PHACO AND INTRAOCULAR LENS PLACEMENT ([place]) LEFT;  Surgeon: Birder Robson, MD;  Location: [facility];  Service: Ophthalmology;  Laterality: Left;   CATARACT EXTRACTION W/PHACO Right 11/29/2018   Procedure: CATARACT EXTRACTION PHACO AND INTRAOCULAR LENS PLACEMENT ([place]) RIGHT;  Surgeon: Birder Robson, MD;  Location: [facility];  Service: Ophthalmology;  Laterality: Right;  1:22 20.1% 16.64   CHOLECYSTECTOMY     2005   KNEE SURGERY Right 2013   two torn ligaments repaired.    LEFT HEART CATH AND CORONARY ANGIOGRAPHY N/A 06/08/2017   Procedure: LEFT HEART CATH AND CORONARY ANGIOGRAPHY;  Surgeon: Troy Sine, MD;  Location: [facility];  Service: Cardiovascular;  Laterality: N/A;   NASAL SINUS SURGERY     VAGINAL HYSTERECTOMY       OB History   No obstetric history on file.     Family History  Problem Relation Age of Onset   Hypertension Mother    Diabetes Mother    Allergies Mother    Heart attack Father  55   Cancer Father        kidney   Emphysema Father    Heart disease Father        CHF age 18   Heart attack Brother 43       Died of MI age 79    Social History   Tobacco Use   Smoking status: Former    Packs/day: 2.00    Years: 40.00    Pack years: 80.00    Types: Cigarettes    Quit date: 02/24/1999    Years since quitting: 21.8   Smokeless tobacco: Never  Vaping Use   Vaping Use: Never used  Substance Use Topics   Alcohol use: Not Currently    Comment:     Drug use: No    Home Medications Prior  to Admission medications   Medication Sig Start Date End Date Taking? Authorizing Provider  ACCU-CHEK GUIDE test strip  07/27/19   [provider]  Accu-Chek Softclix Lancets lancets  07/27/19   [provider]  albuterol (VENTOLIN HFA) 108 (90 Base) MCG/ACT inhaler Inhale 2 puffs into the lungs every 6 (six) hours as needed for shortness of breath.     [provider]  Blood Glucose Monitoring Suppl (ACCU-CHEK GUIDE ME) w/Device KIT  07/27/19   [provider]  Calcium Carbonate-Vitamin D 600-200 MG-UNIT TABS Take 1 tablet by mouth daily.     [provider]  Clobetasol Prop Emollient Base 0.05 % emollient cream APPLY 1 APPLICATION ONCE A WEEK AS NEEDED EXTERNALLY 90 DAYS 01/26/19   [provider]  DULoxetine (CYMBALTA) 60 MG capsule Take 120 mg by mouth daily.     [provider]  etanercept (ENBREL) 50 MG/ML injection Inject 50 mg into the skin once a week.    [provider]  fluocinonide (LIDEX) 0.05 % external solution Apply 1 application topically 2 (two) times daily as needed (skin irritation).     [provider]  folic acid (FOLVITE) 1 MG tablet Take 1 tablet by mouth daily.    [provider]  Glucosamine-Chondroitin (MOVE FREE PO) Take by mouth daily.    [provider]  HYDROcodone-acetaminophen (NORCO) 7.5-325 MG tablet Take 1 tablet by mouth every 6 (six) hours as needed for moderate pain or severe pain. 11/26/20 12/26/20  Molli Barrows, MD  HYDROcodone-acetaminophen (NORCO) 7.5-325 MG tablet Take 1 tablet by mouth every 6 (six) hours as needed for moderate pain or severe pain. 12/26/20 01/25/21  Molli Barrows, MD  LORazepam (ATIVAN) 0.5 MG tablet Take 0.5 mg by mouth daily as needed. 07/19/19   [provider]  meloxicam (MOBIC) 15 MG tablet 1 tablet    [provider]  methotrexate 2.5 MG tablet 8 tabs 01/31/20   [provider]  metoprolol tartrate (LOPRESSOR) 25 MG  tablet Take 0.5 tablets (12.5 mg total) by mouth 2 (two) times daily. 11/29/19 11/28/20  Barrett, Evelene Croon, PA-C  Multiple Vitamin (MULTI-VITAMIN PO) Take 1 tablet by mouth daily.     [provider]  Omega-3 Fatty Acids (FISH OIL PO) Take 1,000 mg by mouth daily.     [provider]  omeprazole (PRILOSEC) 40 MG capsule Take 1 capsule by mouth daily. 04/08/17   [provider]  OSPHENA 60 MG TABS Take 1 tablet by mouth daily. 06/25/19   [provider]  predniSONE (DELTASONE) 10 MG tablet Take 5 mg by mouth daily with breakfast.     [provider]  Probiotic Product (PROBIOTIC DAILY PO) Take by mouth. Patient not taking: No sig reported    [provider]  rosuvastatin (CRESTOR) 20 MG tablet Take 20 mg by mouth once a week. 01/02/20   [provider]  valsartan (DIOVAN) 40 MG tablet Take 80 mg by mouth daily.    [provider]    Allergies    Morphine, Advair diskus [fluticasone-salmeterol], Ambien [zolpidem tartrate], Amlodipine besylate, Bextra [valdecoxib], Captopril, Latex, Lisinopril, Pulmicort [budesonide], Savella [milnacipran hcl], and Spiriva [tiotropium bromide monohydrate]  Review of Systems   Review of Systems  Constitutional:  Positive for diaphoresis. Negative for chills and fever.  HENT:  Negative for ear pain and sore throat.   Eyes:  Negative for pain and visual disturbance.  Respiratory:  Negative for cough and shortness of breath.   Cardiovascular:  Positive for palpitations. Negative for chest pain.  Gastrointestinal:  Positive for diarrhea. Negative for abdominal pain and vomiting.  Genitourinary:  Negative for dysuria and hematuria.  Musculoskeletal:  Negative for arthralgias and back pain.  Skin:  Negative for color change and rash.  Neurological:  Positive for weakness. Negative for seizures and syncope.  All other systems reviewed and are negative.  Physical Exam Updated Vital Signs BP 100/64  (BP Location: Left Arm)   Pulse 73   Temp 97.8 F (36.6 C) (Temporal)   Resp 20   Ht 5' 7" (1.702 m)   Wt 100 kg   SpO2 97%   BMI 34.53 kg/m   Physical Exam Vitals and nursing note reviewed.  Constitutional:      General: She is not in acute distress.    Appearance: She is well-developed. She is obese. She is not ill-appearing or diaphoretic.  HENT:     Head: Normocephalic and atraumatic.  Eyes:     Conjunctiva/sclera: Conjunctivae normal.     Pupils: Pupils are equal, round, and reactive to light.  Cardiovascular:     Rate and Rhythm: Normal rate and regular rhythm.     Pulses: Normal pulses.     Heart sounds: No murmur heard. Pulmonary:     Effort: Pulmonary effort is normal. No respiratory distress.     Breath sounds: Normal breath sounds.  Abdominal:     General: There is no distension.     Palpations: Abdomen is soft.     Tenderness: There is no abdominal tenderness. There is no guarding.  Musculoskeletal:        General: No deformity or signs of injury.     Cervical back: Normal range of motion and neck supple.     Right lower leg: No edema.     Left lower leg: No edema.  Skin:    General: Skin is warm and dry.     Findings: No lesion or rash.  Neurological:     General: No focal deficit present.     Mental Status: She is alert. Mental status is at baseline.    ED Results / Procedures / Treatments   Labs (all labs ordered are listed, but only abnormal results are displayed) Labs Reviewed  RESP PANEL BY RT-PCR (FLU A&B, COVID) ARPGX2 - Abnormal; Notable for the following components:      Result Value   SARS Coronavirus 2 by RT PCR POSITIVE (*)    All other components within normal limits  BASIC METABOLIC PANEL - Abnormal; Notable for the following components:   Glucose, Bld 142 (*)    All other components within normal limits  HEPATIC FUNCTION PANEL - Abnormal; Notable for the following components:   Total Protein 5.6 (*)    Albumin 3.2 (*)    All other  components within normal limits  TSH - Abnormal; Notable for the following components:   TSH 0.054 (*)    All other components within normal limits  HEMOGLOBIN A1C - Abnormal; Notable for the following components:   Hgb A1c MFr Bld 6.1 (*)    All other components within normal limits  TROPONIN I (HIGH SENSITIVITY) - Abnormal; Notable for the following components:   Troponin I (High Sensitivity) 33 (*)    All other components within normal limits  CBC  PROTIME-INR  LIPID PANEL  APTT  MAGNESIUM  BASIC METABOLIC PANEL  CBC  MAGNESIUM  T4, FREE  TYPE AND SCREEN  ABO/RH  TROPONIN I (HIGH SENSITIVITY)    EKG EKG Interpretation  Date/Time:  Wednesday December 11 2020 22:57:09 EDT Ventricular Rate:  77 PR Interval:  114 QRS Duration: 105 QT Interval:  389 QTC Calculation: 441 R Axis:   -44 Text Interpretation: Sinus rhythm Atrial premature complex Borderline short PR interval Left anterior fascicular block Consider anterior infarct Confirmed by Regan Lemming ([id number]) on 12/11/2020 11:03:10 PM  Radiology DG Chest Portable 1 View  Result Date: 12/11/2020 CLINICAL DATA:  Syncope, ventricular tachycardia EXAM: PORTABLE CHEST 1 VIEW COMPARISON:  12/28/2017 FINDINGS: Heart size upper normal. Vascularity normal. Atherosclerotic aortic arch. Lungs clear without infiltrate or effusion. IMPRESSION: No active disease. Electronically Signed   By: Franchot Gallo M.D.   On: 12/11/2020 19:16    Procedures Procedures   Medications Ordered in ED Medications  enoxaparin (LOVENOX) injection 40 mg (40 mg Subcutaneous Given 12/11/20 2145)  amiodarone (NEXTERONE PREMIX) 360-4.14 MG/200ML-% (1.8 mg/mL) IV infusion (60 mg/hr Intravenous New Bag/Given 12/11/20 2145)    Followed by  amiodarone (NEXTERONE PREMIX) 360-4.14 MG/200ML-% (1.8 mg/mL) IV infusion (has no administration in time range)  HYDROcodone-acetaminophen (NORCO) 7.5-325 MG per tablet 1 tablet (1 tablet Oral Given 12/12/20 0046)   rosuvastatin (CRESTOR) tablet 20 mg (20 mg Oral Not Given 12/12/20 0106)  DULoxetine (CYMBALTA) DR capsule 120 mg (has no administration in time range)  Ospemifene TABS 1 tablet (has no administration in time range)  pantoprazole (PROTONIX) EC tablet 40 mg (has no administration in time range)  folic acid (FOLVITE) tablet 1 mg (has no administration in time range)  calcium-vitamin D (OSCAL WITH D) 500-200 MG-UNIT per tablet 1 tablet (has no administration in time range)  multivitamin with minerals tablet 1 tablet (has no administration in time range)  albuterol (PROVENTIL) (2.5 MG/3ML) 0.083% nebulizer solution 2.5 mg (has no administration in time range)  aspirin EC tablet 81 mg (has no administration in time range)    ED Course  I have reviewed the triage vital signs and the nursing notes.  Pertinent labs & imaging results that were available during my care of the patient were reviewed by me and considered in my medical decision making (see chart for details).    MDM Rules/Calculators/A&P                           75 year old female with a history of COPD, fibromyalgia, GERD, HTN, hypothyroidism, OSA, recent COVID-positive test 3 weeks ago with residual diarrhea for the past 2 weeks who presents emergency department after a syncopal episode at home.  EMS provided majority of the patient HPI.  On EMS arrival, the patient was found to be  in V. tach at a rate of 196.  The patient was pale diaphoretic after her syncopal episode.  IV access was obtained and the patient was administered 150 mg amiodarone bolus with subsequent conversion to normal sinus rhythm.  The patient remained pained hemodynamic stability throughout the episode.  No further episodes of ventricular tachycardia.  The patient was transported to [facility] health where she arrived ABC intact, hemodynamically stable.  Patient presents after an episode of monomorphic ventricular tachycardia as concurrent on EMS telemetry.  Ventricular  tachycardia broke after her initial bolus of amiodarone.  She arrives hemodynamically stable in normal sinus rhythm.  Cardiology consulted on patient arrival given her persistent sustained ventricular tachycardia and high risk syncopal episode earlier today.  Initial EKG revealed trigeminy with no acute ischemic changes.  Initial troponin was normal at 15, repeat mildly elevated to 33, will continue to trend.  This is not surprising given the patient's recent ventricular tachycardia.  Favor likely demand ischemia.  Patient denies any symptoms of ACS at this time.  Following evaluation by cardiology, recommended amiodarone load of 10 g and admission for telemetry and ischemic work-up/evaluation given concern for possible myocardial scarring triggering VT.  No further episodes of VT in the ED and the patient remained hemodynamically stable at time of admission.  Final Clinical Impression(s) / ED Diagnoses Final diagnoses:  Syncope, cardiogenic  Ventricular tachycardia    Rx / DC Orders ED Discharge Orders     None        Regan Lemming, MD 12/12/20 [phone]

## 2020-12-11 NOTE — ED Notes (Signed)
Placed pt on cardiac pads.

## 2020-12-11 NOTE — H&P (Signed)
Cardiology Admission History and Physical:   Patient ID: Kelsey Diaz MRN: [id number]; DOB: September 01, 1945   Admission date: 12/11/2020  PCP:  Harlan Stains, MD   [facility] Providers Cardiologist:  Minus Breeding, MD        Chief Complaint: Syncope  Patient Profile:   Kelsey Diaz is a 75 y.o. female with COPD, OSA (not on CPAP), HTN, RA, and fibromyalgia who is being seen 12/11/2020 for the evaluation of ventricular tachycardia.  History of Present Illness:   Kelsey Diaz reports that over the past year she is generally felt unwell.  She reports that she occasionally has episodes of "passing out" where she loses consciousness, but does not quite hit the ground.  These episodes are associated with chest tightness, palpitations, and SOB.  Each episode lasts for approximately 1 minute then subsides on its own.  Unfortunately, she lost her husband around the time of her symptom onset so she attributed her symptoms to bereavement.  In the past 2 weeks, her symptoms have become more frequent however, and have occurred mostly upon standing.  Today the patient was at home and once again had the sensation of passing out causing her to fall to her knees and feeling unstable.  She felt as though this episode was worse than prior episodes in terms of severity.  As result she called EMS who arrived and found that the patient was in a monomorphic VT with a HR of 196 bpm.  She was bolused with 150 mg IV amiodarone and her VT broke.  She denies focal weakness, urinary symptoms, peripheral edema, PND orthopnea.  She does endorse having 1 week of nausea and abdominal pain as well that is mild.  Given her presentation she was brought to the ED for further investigation.  Of note, the patient underwent an LHC in 05/2017 for atypical chest pain.  She was found to have myocardial calcifications without any significant stenotic lesions.  She has not take an aspirin or statin.  She is a former smoker but  quit many years ago.  She denies alcohol and illicit drug use.  She has never had heart rhythm issues before to her knowledge.  In the ED the patient's VS were afebrile, HR 94, BP 109/62, RR 18, and satting 93% on RA.  Labs notable for A1c 6.1, TSH 0.054, troponin 15 -> 33 and unremarkable CBC.  EKG showed trigeminy without ischemic changes.  CXR was WNL.  She was admitted to cardiology for further care.   Past Medical History:  Diagnosis Date   Allergic rhinitis    Anxiety    Asthma    Bilateral hip pain 11/28/2018   Colitis    COPD (chronic obstructive pulmonary disease) ([place])    Fibromyalgia 05/16/2019   GERD (gastroesophageal reflux disease)    With esophageal strictures   Hypertension    Hyperthyroidism    OSA (obstructive sleep apnea) 02/09/2012   No mask.  Did not tolerate   Osteoarthritis    Rheumatoid arthritis ([place]) 10/03/2018    Past Surgical History:  Procedure Laterality Date   BREAST BIOPSY     x2   CATARACT EXTRACTION W/PHACO Left 09/27/2018   Procedure: CATARACT EXTRACTION PHACO AND INTRAOCULAR LENS PLACEMENT ([place]) LEFT;  Surgeon: Birder Robson, MD;  Location: [place];  Service: Ophthalmology;  Laterality: Left;   CATARACT EXTRACTION W/PHACO Right 11/29/2018   Procedure: CATARACT EXTRACTION PHACO AND INTRAOCULAR LENS PLACEMENT ([facility]) RIGHT;  Surgeon: Birder Robson, MD;  Location: [place]  [facility];  Service: Ophthalmology;  Laterality: Right;  1:22 20.1% 16.64   CHOLECYSTECTOMY     2005   KNEE SURGERY Right 2013   two torn ligaments repaired.    LEFT HEART CATH AND CORONARY ANGIOGRAPHY N/A 06/08/2017   Procedure: LEFT HEART CATH AND CORONARY ANGIOGRAPHY;  Surgeon: Troy Sine, MD;  Location: [place] [facility];  Service: Cardiovascular;  Laterality: N/A;   NASAL SINUS SURGERY     VAGINAL HYSTERECTOMY       Medications Prior to Admission: Prior to Admission medications   Medication Sig Start Date End Date Taking? Authorizing Provider   ACCU-CHEK GUIDE test strip  07/27/19   [provider]  Accu-Chek Softclix Lancets lancets  07/27/19   [provider]  albuterol (VENTOLIN HFA) 108 (90 Base) MCG/ACT inhaler Inhale 2 puffs into the lungs every 6 (six) hours as needed for shortness of breath.     [provider]  Blood Glucose Monitoring Suppl (ACCU-CHEK GUIDE ME) w/Device KIT  07/27/19   [provider]  Calcium Carbonate-Vitamin D 600-200 MG-UNIT TABS Take 1 tablet by mouth daily.     [provider]  Clobetasol Prop Emollient Base 0.05 % emollient cream APPLY 1 APPLICATION ONCE A WEEK AS NEEDED EXTERNALLY 90 DAYS 01/26/19   [provider]  DULoxetine (CYMBALTA) 60 MG capsule Take 120 mg by mouth daily.     [provider]  etanercept (ENBREL) 50 MG/ML injection Inject 50 mg into the skin once a week.    [provider]  fluocinonide (LIDEX) 0.05 % external solution Apply 1 application topically 2 (two) times daily as needed (skin irritation).     [provider]  folic acid (FOLVITE) 1 MG tablet Take 1 tablet by mouth daily.    [provider]  Glucosamine-Chondroitin (MOVE FREE PO) Take by mouth daily.    [provider]  HYDROcodone-acetaminophen (NORCO) 7.5-325 MG tablet Take 1 tablet by mouth every 6 (six) hours as needed for moderate pain or severe pain. 11/26/20 12/26/20  Molli Barrows, MD  HYDROcodone-acetaminophen (NORCO) 7.5-325 MG tablet Take 1 tablet by mouth every 6 (six) hours as needed for moderate pain or severe pain. 12/26/20 01/25/21  Molli Barrows, MD  LORazepam (ATIVAN) 0.5 MG tablet Take 0.5 mg by mouth daily as needed. 07/19/19   [provider]  meloxicam (MOBIC) 15 MG tablet 1 tablet    [provider]  methotrexate 2.5 MG tablet 8 tabs 01/31/20   [provider]  metoprolol tartrate (LOPRESSOR) 25 MG tablet Take 0.5 tablets (12.5 mg total) by mouth 2 (two) times daily. 11/29/19 11/28/20   Barrett, Evelene Croon, PA-C  Multiple Vitamin (MULTI-VITAMIN PO) Take 1 tablet by mouth daily.     [provider]  Omega-3 Fatty Acids (FISH OIL PO) Take 1,000 mg by mouth daily.     [provider]  omeprazole (PRILOSEC) 40 MG capsule Take 1 capsule by mouth daily. 04/08/17   [provider]  OSPHENA 60 MG TABS Take 1 tablet by mouth daily. 06/25/19   [provider]  predniSONE (DELTASONE) 10 MG tablet Take 5 mg by mouth daily with breakfast.     [provider]  Probiotic Product (PROBIOTIC DAILY PO) Take by mouth. Patient not taking: No sig reported    [provider]  rosuvastatin (CRESTOR) 20 MG tablet Take 20 mg by mouth once a week. 01/02/20   [provider]  valsartan (DIOVAN) 40 MG tablet  Take 80 mg by mouth daily.    [provider]     Allergies:    Allergies  Allergen Reactions   Morphine Other (See Comments)    Stopped heart   Advair Diskus [Fluticasone-Salmeterol]     Sensitivity to smells   Ambien [Zolpidem Tartrate]     irritable   Amlodipine Besylate     Feels bad   Bextra [Valdecoxib]     Stomach issues   Captopril     Ache   Latex Itching    Bandaids only   Lisinopril     cough   Pulmicort [Budesonide]     Worse breathing, sensitivity to smells   Savella [Milnacipran Hcl]     Mental fog   Spiriva [Tiotropium Bromide Monohydrate]     Dry mouth    Social History:   Social History   Socioeconomic History   Marital status: Married    Spouse name: Not on file   Number of children: 3   Years of education: Not on file   Highest education level: Not on file  Occupational History   Occupation: Retired    Fish farm manager: [employer]  Tobacco Use   Smoking status: Former    Packs/day: 2.00    Years: 40.00    Pack years: 80.00    Types: Cigarettes    Quit date: 02/24/1999    Years since quitting: 21.8   Smokeless tobacco: Never  Vaping Use   Vaping Use: Never used  Substance and Sexual Activity    Alcohol use: Not Currently    Comment:     Drug use: No   Sexual activity: Not on file  Other Topics Concern   Not on file  Social History Narrative   Lives with husband.  5 children together. (She has 3).     Social Determinants of Health   Financial Resource Strain: Not on file  Food Insecurity: Not on file  Transportation Needs: Not on file  Physical Activity: Not on file  Stress: Not on file  Social Connections: Not on file  Intimate Partner Violence: Not on file    Family History:   The patient's family history includes Allergies in her mother; Cancer in her father; Diabetes in her mother; Emphysema in her father; Heart attack (age of onset: 56) in her father; Heart attack (age of onset: 90) in her brother; Heart disease in her father; Hypertension in her mother.    ROS:  Please see the history of present illness.  All other ROS reviewed and negative.     Physical Exam/Data:   Vitals:   12/11/20 1828 12/11/20 1830 12/11/20 1939 12/11/20 2000  BP: (!) 149/130 109/62  108/72  Pulse: (!) 32 (!) 32  68  Resp: _0 Temp: 97.8 F (36.6 C)     TempSrc: Temporal     SpO2: 96% 93%  96%  Weight:  90.7 kg 100 kg   Height:  5' 7" (1.702 m) 5' 7" (1.702 m)    No intake or output data in the 24 hours ending 12/11/20 2028 Last 3 Weights 12/11/2020 12/11/2020 09/30/2020  Weight (lbs) 220 lb 7.4 oz 200 lb 198 lb  Weight (kg) 100 kg 90.719 kg 89.812 kg     Body mass index is 34.53 kg/m.  General:  Well nourished, well developed, in no acute distress, very pleasant HEENT: Atraumatic, normocephalic, MMM Neck: no JVD Vascular: No carotid bruits; Distal pulses 2+ bilaterally   Cardiac:  normal S1,  S2; RRR; no murmur, rubs or gallops Lungs:  clear to auscultation bilaterally, no wheezing, rhonchi or rales  Abd: soft, nontender, no hepatomegaly  Ext: no edema Musculoskeletal:  No deformities, BUE and BLE strength normal and equal Skin: warm and dry  Neuro:  CNs 2-12  intact, no focal abnormalities noted Psych:  Normal affect    EKG:  The ECG that was done  was personally reviewed and demonstrates trigeminy      Relevant CV Studies:  LHC 06/08/17:  Ost LAD to Prox LAD lesion is 5% stenosed. The left ventricular systolic function is normal. LV end diastolic pressure is normal.   Evidence for myocardial calcification involving the superior aspect of the very proximal near ostial LAD without significant focal intraluminal stenosis.  Normal intermediate, normal left circumflex, and normal large dominant RCA.   Normal LV function with an EF at approximately 55%.     Laboratory Data:  High Sensitivity Troponin:   Recent Labs  Lab 12/11/20 1825  TROPONINIHS 15      Chemistry Recent Labs  Lab 12/11/20 1825  NA 139  K 3.8  CL 106  CO2 23  GLUCOSE 142*  BUN 13  CREATININE 0.78  CALCIUM 8.9  GFRNONAA >60  ANIONGAP 10    No results for input(s): PROT, ALBUMIN, AST, ALT, ALKPHOS, BILITOT in the last 168 hours. Lipids No results for input(s): CHOL, TRIG, HDL, LABVLDL, LDLCALC, CHOLHDL in the last 168 hours. Hematology Recent Labs  Lab 12/11/20 1825  WBC 5.2  RBC 4.64  HGB 14.5  HCT 44.0  MCV 94.8  MCH 31.3  MCHC 33.0  RDW 12.2  PLT 150   Thyroid No results for input(s): TSH, FREET4 in the last 168 hours. BNPNo results for input(s): BNP, PROBNP in the last 168 hours.  DDimer No results for input(s): DDIMER in the last 168 hours.   Radiology/Studies:  DG Chest Portable 1 View  Result Date: 12/11/2020 CLINICAL DATA:  Syncope, ventricular tachycardia EXAM: PORTABLE CHEST 1 VIEW COMPARISON:  12/28/2017 FINDINGS: Heart size upper normal. Vascularity normal. Atherosclerotic aortic arch. Lungs clear without infiltrate or effusion. IMPRESSION: No active disease. Electronically Signed   By: Franchot Gallo M.D.   On: 12/11/2020 19:16     Assessment and Plan:   Kelsey Diaz is a 75 y.o. female with COPD, OSA (not on CPAP),  HTN, RA, and fibromyalgia who is being seen 12/11/2020 for the evaluation of ventricular tachycardia.  #Monomorphic Ventricular Tachycardia #Likely Cardiogenic Syncope :: Patient presenting with intermittent syncopal episodes associated with chest tightness and palpitations.  Patient found to have monomorphic VT by EMS assessment.  Currently hemodynamically stable although initial EKG showed frequent ectopy.  Her QTC is normal on EKG.  My greatest concern at this time is that she likely has a scar mediated macro reentrant VT.  She does not have a known significant CAD, but I feel that this should be ruled out given that she has new VT.  We will also get a TTE to further assess for structural cardiac abnormalities.  I will plan to treat her with amiodarone for AAD therapy.  Lastly, she will need an ICD for secondary prevention if there is no clear reversible cause for VT. -Start 10 g amiodarone load for VT -Speak with interventional team about LHC to rule out CAD as a precipitant of her VT -N.p.o. at midnight -TTE to assess for structural heart disease -Maintain telemetry -K >4, Mg>2 -She will ultimately need a secondary prevention  ICD for VT especially since she is symptomatic.  Can discuss timing of this with the EP. -Start baby ASA 81 mg   #COPD -Continue albuterol every 6 hours as needed  #Fibromyalgia #RA -Continue Norco every 6 hours. -Continue duloxetine 120 mg daily -Holding home Enbrel  #OSA She has a CPAP but cannot tolerate it and is not interested in have 1 in the hospital.   Risk Assessment/Risk Scores:           Severity of Illness: The appropriate patient status for this patient is INPATIENT. Inpatient status is judged to be reasonable and necessary in order to provide the required intensity of service to ensure the patient's safety. The patient's presenting symptoms, physical exam findings, and initial radiographic and laboratory data in the context of their chronic  comorbidities is felt to place them at high risk for further clinical deterioration. Furthermore, it is not anticipated that the patient will be medically stable for discharge from the hospital within 2 midnights of admission.   * I certify that at the point of admission it is my clinical judgment that the patient will require inpatient hospital care spanning beyond 2 midnights from the point of admission due to high intensity of service, high risk for further deterioration and high frequency of surveillance required.*   For questions or updates, please contact [place] Please consult [email or web address] for contact info under     Signed, Hershal Coria, MD  12/11/2020 8:28 PM

## 2020-12-12 ENCOUNTER — Encounter (HOSPITAL_COMMUNITY)
Admission: EM | Disposition: A | Discharge status: Home / Self Care | Payer: Self-pay | Source: Home / Self Care | Attending: Cardiology

## 2020-12-12 ENCOUNTER — Inpatient Hospital Stay (HOSPITAL_COMMUNITY): Payer: Medicare HMO

## 2020-12-12 ENCOUNTER — Encounter (HOSPITAL_COMMUNITY): Payer: Self-pay | Admitting: Student in an Organized Health Care Education/Training Program

## 2020-12-12 ENCOUNTER — Other Ambulatory Visit (HOSPITAL_COMMUNITY): Payer: Medicare HMO

## 2020-12-12 DIAGNOSIS — I358 Other nonrheumatic aortic valve disorders: Secondary | ICD-10-CM

## 2020-12-12 DIAGNOSIS — I5030 Unspecified diastolic (congestive) heart failure: Secondary | ICD-10-CM

## 2020-12-12 DIAGNOSIS — I472 Ventricular tachycardia, unspecified: Secondary | ICD-10-CM | POA: Diagnosis not present

## 2020-12-12 DIAGNOSIS — R55 Syncope and collapse: Secondary | ICD-10-CM

## 2020-12-12 DIAGNOSIS — I2510 Atherosclerotic heart disease of native coronary artery without angina pectoris: Secondary | ICD-10-CM

## 2020-12-12 HISTORY — PX: LEFT HEART CATH AND CORONARY ANGIOGRAPHY: CATH118249

## 2020-12-12 LAB — ECHOCARDIOGRAM COMPLETE
AR max vel: 2.7 cm2
AV Area VTI: 2.7 cm2
AV Area mean vel: 2.64 cm2
AV Mean grad: 5 mmHg
AV Peak grad: 9.5 mmHg
Ao pk vel: 1.54 m/s
Area-P 1/2: 3.68 cm2
Height: 67 in
S' Lateral: 2.7 cm
Weight: 3527.36 oz

## 2020-12-12 LAB — TYPE AND SCREEN
ABO/RH(D): B POS
Antibody Screen: NEGATIVE

## 2020-12-12 LAB — BASIC METABOLIC PANEL
Anion gap: 9 (ref 5–15)
Anion gap: 10 (ref 5–15)
Anion gap: 5 (ref 5–15)
BUN: 15 mg/dL (ref 8–23)
BUN: 11 mg/dL (ref 8–23)
BUN: 13 mg/dL (ref 8–23)
CO2: 23 mmol/L (ref 22–32)
CO2: 24 mmol/L (ref 22–32)
CO2: 23 mmol/L (ref 22–32)
Calcium: 8.6 mg/dL — ABNORMAL LOW (ref 8.9–10.3)
Calcium: 8.8 mg/dL — ABNORMAL LOW (ref 8.9–10.3)
Calcium: 8.4 mg/dL — ABNORMAL LOW (ref 8.9–10.3)
Chloride: 103 mmol/L (ref 98–111)
Chloride: 103 mmol/L (ref 98–111)
Chloride: 108 mmol/L (ref 98–111)
Creatinine, Ser: 0.73 mg/dL (ref 0.44–1.00)
Creatinine, Ser: 0.69 mg/dL (ref 0.44–1.00)
Creatinine, Ser: 0.7 mg/dL (ref 0.44–1.00)
GFR, Estimated: >60 mL/min (ref >60)
GFR, Estimated: >60 mL/min (ref >60)
GFR, Estimated: >60 mL/min (ref >60)
Glucose, Bld: 97 mg/dL (ref 70–99)
Glucose, Bld: 132 mg/dL — ABNORMAL HIGH (ref 70–99)
Glucose, Bld: 129 mg/dL — ABNORMAL HIGH (ref 70–99)
Potassium: 3.4 mmol/L — ABNORMAL LOW (ref 3.5–5.1)
Potassium: 3.8 mmol/L (ref 3.5–5.1)
Potassium: 4.4 mmol/L (ref 3.5–5.1)
Sodium: 135 mmol/L (ref 135–145)
Sodium: 137 mmol/L (ref 135–145)
Sodium: 136 mmol/L (ref 135–145)

## 2020-12-12 LAB — CBC
HCT: 38.5 % (ref 36.0–46.0)
Hemoglobin: 12.8 g/dL (ref 12.0–15.0)
MCH: 31.1 pg (ref 26.0–34.0)
MCHC: 33.2 g/dL (ref 30.0–36.0)
MCV: 93.4 fL (ref 80.0–100.0)
Platelets: 147 10*3/uL — ABNORMAL LOW (ref 150–400)
RBC: 4.12 MIL/uL (ref 3.87–5.11)
RDW: 12.2 % (ref 11.5–15.5)
WBC: 5.7 10*3/uL (ref 4.0–10.5)
nRBC: 0 % (ref 0.0–0.2)

## 2020-12-12 LAB — MAGNESIUM
Magnesium: 1.4 mg/dL — ABNORMAL LOW (ref 1.7–2.4)
Magnesium: 2.3 mg/dL (ref 1.7–2.4)

## 2020-12-12 LAB — GLUCOSE, CAPILLARY
Glucose-Capillary: 139 mg/dL — ABNORMAL HIGH (ref 70–99)
Glucose-Capillary: 131 mg/dL — ABNORMAL HIGH (ref 70–99)

## 2020-12-12 LAB — ABO/RH: ABO/RH(D): B POS

## 2020-12-12 LAB — T4, FREE: Free T4: 1.17 ng/dL — ABNORMAL HIGH (ref 0.61–1.12)

## 2020-12-12 SURGERY — LEFT HEART CATH AND CORONARY ANGIOGRAPHY
Anesthesia: LOCAL

## 2020-12-12 MED ORDER — ACETAMINOPHEN 325 MG PO TABS
650.0000 mg | ORAL_TABLET | ORAL | Status: DC | PRN
  Administered 2020-12-12 – 2020-12-16 (×4): 650 mg via ORAL
  Filled 2020-12-12 (×4): qty 2

## 2020-12-12 MED ORDER — FOLIC ACID 1 MG PO TABS
1.0000 mg | ORAL_TABLET | Freq: Every day | ORAL | Status: DC
  Administered 2020-12-12 – 2020-12-17 (×6): 1 mg via ORAL
  Filled 2020-12-12 (×6): qty 1

## 2020-12-12 MED ORDER — POTASSIUM CHLORIDE CRYS ER 20 MEQ PO TBCR
40.0000 meq | EXTENDED_RELEASE_TABLET | Freq: Once | ORAL | Status: AC
  Administered 2020-12-12: 40 meq via ORAL
  Filled 2020-12-12: qty 2

## 2020-12-12 MED ORDER — LABETALOL HCL 5 MG/ML IV SOLN: 10.0000 mg | INTRAVENOUS | Status: AC | PRN

## 2020-12-12 MED ORDER — SODIUM CHLORIDE 0.9 % IV SOLN: 250.0000 mL | INTRAVENOUS | Status: DC | PRN

## 2020-12-12 MED ORDER — HEPARIN (PORCINE) IN NACL 2000-0.9 UNIT/L-% IV SOLN: INTRAVENOUS | Status: DC | PRN

## 2020-12-12 MED ORDER — VERAPAMIL HCL 2.5 MG/ML IV SOLN: INTRA_ARTERIAL | Status: DC | PRN

## 2020-12-12 MED ORDER — FENTANYL CITRATE (PF) 100 MCG/2ML IJ SOLN
INTRAMUSCULAR | Status: DC | PRN
  Administered 2020-12-12: 25 ug via INTRAVENOUS

## 2020-12-12 MED ORDER — LIDOCAINE HCL (PF) 1 % IJ SOLN
INTRAMUSCULAR | Status: DC | PRN
  Administered 2020-12-12: 2 mL

## 2020-12-12 MED ORDER — ONDANSETRON HCL 4 MG/2ML IJ SOLN: 4.0000 mg | Freq: Four times a day (QID) | INTRAMUSCULAR | Status: DC | PRN

## 2020-12-12 MED ORDER — ROSUVASTATIN CALCIUM 20 MG PO TABS
20.0000 mg | ORAL_TABLET | ORAL | Status: DC
  Administered 2020-12-13 – 2020-12-16 (×2): 20 mg via ORAL
  Filled 2020-12-12 (×3): qty 1

## 2020-12-12 MED ORDER — HEPARIN (PORCINE) IN NACL 1000-0.9 UT/500ML-% IV SOLN
INTRAVENOUS | Status: DC | PRN
  Administered 2020-12-12 (×2): 500 mL

## 2020-12-12 MED ORDER — NITROGLYCERIN 1 MG/10 ML FOR IR/CATH LAB
INTRA_ARTERIAL | Status: AC
  Filled 2020-12-12: qty 10

## 2020-12-12 MED ORDER — HEPARIN (PORCINE) IN NACL 1000-0.9 UT/500ML-% IV SOLN
INTRAVENOUS | Status: AC
  Filled 2020-12-12: qty 1000

## 2020-12-12 MED ORDER — HEPARIN SODIUM (PORCINE) 1000 UNIT/ML IJ SOLN
INTRAMUSCULAR | Status: AC
  Filled 2020-12-12: qty 1

## 2020-12-12 MED ORDER — DULOXETINE HCL 60 MG PO CPEP
120.0000 mg | ORAL_CAPSULE | Freq: Every day | ORAL | Status: DC
  Administered 2020-12-12 – 2020-12-17 (×6): 120 mg via ORAL
  Filled 2020-12-12 (×6): qty 2

## 2020-12-12 MED ORDER — SODIUM CHLORIDE 0.9% FLUSH: 3.0000 mL | INTRAVENOUS | Status: DC | PRN

## 2020-12-12 MED ORDER — FENTANYL CITRATE (PF) 100 MCG/2ML IJ SOLN
INTRAMUSCULAR | Status: AC
  Filled 2020-12-12: qty 2

## 2020-12-12 MED ORDER — HYDROCODONE-ACETAMINOPHEN 7.5-325 MG PO TABS
1.0000 | ORAL_TABLET | Freq: Four times a day (QID) | ORAL | Status: DC | PRN
  Administered 2020-12-12 – 2020-12-17 (×16): 1 via ORAL
  Filled 2020-12-12 (×18): qty 1

## 2020-12-12 MED ORDER — MIDAZOLAM HCL 2 MG/2ML IJ SOLN
INTRAMUSCULAR | Status: DC | PRN
  Administered 2020-12-12: 1 mg via INTRAVENOUS

## 2020-12-12 MED ORDER — HEPARIN SODIUM (PORCINE) 1000 UNIT/ML IJ SOLN
INTRAMUSCULAR | Status: DC | PRN
  Administered 2020-12-12: 5000 [IU] via INTRAVENOUS

## 2020-12-12 MED ORDER — CHLORHEXIDINE GLUCONATE CLOTH 2 % EX PADS
6.0000 | MEDICATED_PAD | Freq: Every day | CUTANEOUS | Status: DC
  Administered 2020-12-12 – 2020-12-17 (×6): 6 via TOPICAL

## 2020-12-12 MED ORDER — LIDOCAINE HCL (PF) 1 % IJ SOLN
INTRAMUSCULAR | Status: AC
  Filled 2020-12-12: qty 30

## 2020-12-12 MED ORDER — INFLUENZA VAC A&B SA ADJ QUAD 0.5 ML IM PRSY
0.5000 mL | PREFILLED_SYRINGE | INTRAMUSCULAR | Status: DC
  Filled 2020-12-12: qty 0.5

## 2020-12-12 MED ORDER — HYDRALAZINE HCL 20 MG/ML IJ SOLN: 10.0000 mg | INTRAMUSCULAR | Status: AC | PRN

## 2020-12-12 MED ORDER — ASPIRIN 81 MG PO CHEW
81.0000 mg | CHEWABLE_TABLET | ORAL | Status: DC
Start: 2020-12-13 — End: 2020-12-12

## 2020-12-12 MED ORDER — LIDOCAINE IN D5W 4-5 MG/ML-% IV SOLN
1.0000 mg/min | INTRAVENOUS | Status: DC
  Administered 2020-12-12: 1 mg/min via INTRAVENOUS
  Filled 2020-12-12: qty 500

## 2020-12-12 MED ORDER — AMIODARONE IV BOLUS ONLY 150 MG/100ML: 150.0000 mg | Freq: Once | INTRAVENOUS | Status: AC

## 2020-12-12 MED ORDER — SODIUM CHLORIDE 0.9 % IV SOLN: INTRAVENOUS | Status: AC

## 2020-12-12 MED ORDER — SODIUM CHLORIDE 0.9 % IV SOLN: INTRAVENOUS | Status: DC

## 2020-12-12 MED ORDER — OSPEMIFENE 60 MG PO TABS: 1.0000 | ORAL_TABLET | Freq: Every day | ORAL | Status: DC

## 2020-12-12 MED ORDER — CALCIUM CARBONATE-VITAMIN D 500-200 MG-UNIT PO TABS
1.0000 | ORAL_TABLET | Freq: Every day | ORAL | Status: DC
  Administered 2020-12-12 – 2020-12-17 (×6): 1 via ORAL
  Filled 2020-12-12 (×10): qty 1

## 2020-12-12 MED ORDER — PANTOPRAZOLE SODIUM 40 MG PO TBEC
40.0000 mg | DELAYED_RELEASE_TABLET | Freq: Every day | ORAL | Status: DC
  Administered 2020-12-12 – 2020-12-17 (×6): 40 mg via ORAL
  Filled 2020-12-12 (×6): qty 1

## 2020-12-12 MED ORDER — SODIUM CHLORIDE 0.9% FLUSH
3.0000 mL | Freq: Two times a day (BID) | INTRAVENOUS | Status: DC
  Administered 2020-12-13 – 2020-12-17 (×9): 3 mL via INTRAVENOUS

## 2020-12-12 MED ORDER — ADULT MULTIVITAMIN W/MINERALS CH
1.0000 | ORAL_TABLET | Freq: Every day | ORAL | Status: DC
  Administered 2020-12-12 – 2020-12-17 (×6): 1 via ORAL
  Filled 2020-12-12 (×6): qty 1

## 2020-12-12 MED ORDER — LIDOCAINE BOLUS VIA INFUSION
50.0000 mg | Freq: Once | INTRAVENOUS | Status: AC
  Administered 2020-12-12: 50 mg via INTRAVENOUS
  Filled 2020-12-12: qty 52

## 2020-12-12 MED ORDER — ROSUVASTATIN CALCIUM 20 MG PO TABS: 20.0000 mg | ORAL_TABLET | ORAL | Status: DC

## 2020-12-12 MED ORDER — MAGNESIUM SULFATE 4 GM/100ML IV SOLN
4.0000 g | Freq: Once | INTRAVENOUS | Status: AC
  Administered 2020-12-12: 4 g via INTRAVENOUS
  Filled 2020-12-12 (×3): qty 100

## 2020-12-12 MED ORDER — MIDAZOLAM HCL 2 MG/2ML IJ SOLN
INTRAMUSCULAR | Status: AC
  Filled 2020-12-12: qty 2

## 2020-12-12 MED ORDER — ASPIRIN EC 81 MG PO TBEC
81.0000 mg | DELAYED_RELEASE_TABLET | Freq: Every day | ORAL | Status: DC
  Administered 2020-12-12 – 2020-12-17 (×6): 81 mg via ORAL
  Filled 2020-12-12 (×6): qty 1

## 2020-12-12 MED ORDER — VERAPAMIL HCL 2.5 MG/ML IV SOLN
INTRAVENOUS | Status: AC
  Filled 2020-12-12: qty 2

## 2020-12-12 MED ORDER — ALBUTEROL SULFATE (2.5 MG/3ML) 0.083% IN NEBU: 2.5000 mg | INHALATION_SOLUTION | Freq: Four times a day (QID) | RESPIRATORY_TRACT | Status: DC | PRN

## 2020-12-12 SURGICAL SUPPLY — 11 items
CATH INFINITI JR4 5F (CATHETERS) ×1 IMPLANT
CATH OPTITORQUE TIG 4.0 5F (CATHETERS) ×1 IMPLANT
DEVICE RAD COMP TR BAND LRG (VASCULAR PRODUCTS) ×1 IMPLANT
GLIDESHEATH SLEND A-KIT 6F 22G (SHEATH) ×1 IMPLANT
GUIDEWIRE INQWIRE 1.5J.035X260 (WIRE) IMPLANT
INQWIRE 1.5J .035X260CM (WIRE) ×2
KIT HEART LEFT (KITS) ×2 IMPLANT
PACK CARDIAC CATHETERIZATION (CUSTOM PROCEDURE TRAY) ×2 IMPLANT
TRANSDUCER W/STOPCOCK (MISCELLANEOUS) ×2 IMPLANT
TUBING CIL FLEX 10 FLL-RA (TUBING) ×2 IMPLANT
WIRE HI TORQ VERSACORE-J 145CM (WIRE) ×1 IMPLANT

## 2020-12-12 NOTE — Interval H&P Note (Signed)
Cath Lab Visit (complete for each Cath Lab visit)  Clinical Evaluation Leading to the Procedure:   ACS: No.  Non-ACS:    Anginal Classification: No Symptoms  Anti-ischemic medical therapy: No Therapy  Non-Invasive Test Results: No non-invasive testing performed  Prior CABG: No previous CABG      History and Physical Interval Note:  12/12/2020 2:13 PM  Kelsey Diaz  has presented today for surgery, with the diagnosis of tach.  The various methods of treatment have been discussed with the patient and family. After consideration of risks, benefits and other options for treatment, the patient has consented to  Procedure(s): LEFT HEART CATH AND CORONARY ANGIOGRAPHY (N/A) as a surgical intervention.  The patient's history has been reviewed, patient examined, no change in status, stable for surgery.  I have reviewed the patient's chart and labs.  Questions were answered to the patient's satisfaction.     Quay Burow

## 2020-12-12 NOTE — Progress Notes (Signed)
Pt again had runs of Vtach. Pt asymptomatic and stated that she used [facility] with the help of daughter. Notified Cardiology on call. Orders received. See MAR.

## 2020-12-12 NOTE — H&P (View-Only) (Signed)
ELECTROPHYSIOLOGY CONSULT NOTE    Patient ID: Kelsey Diaz MRN: [id number], DOB/AGE: 11/18/1945 75 y.o.  Admit date: 12/11/2020 Date of Consult: 12/12/2020  Primary Physician: Harlan Stains, MD Primary Cardiologist: Minus Breeding, MD  Electrophysiologist:  New to Dr. Quentin Ore  Referring Provider: Dr. Johnsie Cancel  Patient Profile: Kelsey Diaz is a 75 y.o. female with a history of non-obstructive CAD, HLD, OSA not on CPAP, HTN, RA, and FM who is being seen today for the evaluation of VT at the request of Dr. Johnsie Cancel.  HPI:  Kelsey Diaz is a 75 y.o. female with medical history as above.  Has felt "generally unwell" x 1 year. Has intermittent episodes of sudden weakness at times associated chest tightness, palpitations, and SOB.  She has described them as "losing consciousness" but also that she doesn't fall all the way to the ground. Episodes usually last ~ 1 minute.  She lost her husband around the time these symptoms started so attributed most of it to grief.    For the past 2 weeks the symptoms have been more frequent and, yesterday, more severe. Mostly upon standing.   Yesterday pt was at home and once again felt the sensation of passing out causing her to fall to her knees and feeling unstable. She called EMS and was found to be in "monomorphic VT" with a HR of 196 bpm. Given 150 mg IV bolus and VT broke. She denies focal weakness, urinary symptoms, peripheral edema, PND, or orthopnea. She had COVID about 5 weeks ago. She tested positive again today (likely residual) but is asymptomatic. Former smoker but quit "years ago"  Had [facility] 05/2017 for atypical chest pain with minimal NOCAD. (Myoview preceding was inconclusive)  EF "45-54" by [facility] 05/2017  Pertinent labs include  COVID + (Likely residual with symptomatic case 5 weeks ago HS trop 15->33 K 3.8 -> 3.4 Cr 0.78 Mg 1.7 -> 1.4 TSH 0.054   Free T4 PENDING  Currently, she is feeling OK. Repeats the history above  with this episode yesterday being the most severe episode she has had.    Past Medical History:  Diagnosis Date   Allergic rhinitis    Anxiety    Asthma    Bilateral hip pain 11/28/2018   Colitis    COPD (chronic obstructive pulmonary disease) ([place])    Fibromyalgia 05/16/2019   GERD (gastroesophageal reflux disease)    With esophageal strictures   Hypertension    Hyperthyroidism    OSA (obstructive sleep apnea) 02/09/2012   No mask.  Did not tolerate   Osteoarthritis    Rheumatoid arthritis ([place]) 10/03/2018     Surgical History:  Past Surgical History:  Procedure Laterality Date   BREAST BIOPSY     x2   CATARACT EXTRACTION W/PHACO Left 09/27/2018   Procedure: CATARACT EXTRACTION PHACO AND INTRAOCULAR LENS PLACEMENT ([place]) LEFT;  Surgeon: Birder Robson, MD;  Location: [place];  Service: Ophthalmology;  Laterality: Left;   CATARACT EXTRACTION W/PHACO Right 11/29/2018   Procedure: CATARACT EXTRACTION PHACO AND INTRAOCULAR LENS PLACEMENT ([place]) RIGHT;  Surgeon: Birder Robson, MD;  Location: [place];  Service: Ophthalmology;  Laterality: Right;  1:22 20.1% 16.64   CHOLECYSTECTOMY     2005   KNEE SURGERY Right 2013   two torn ligaments repaired.    LEFT HEART CATH AND CORONARY ANGIOGRAPHY N/A 06/08/2017   Procedure: LEFT HEART CATH AND CORONARY ANGIOGRAPHY;  Surgeon: Troy Sine, MD;  Location: [place] [facility];  Service: Cardiovascular;  Laterality: N/A;   NASAL SINUS SURGERY     VAGINAL HYSTERECTOMY       (Not in a hospital admission)   Inpatient Medications:   aspirin EC  81 mg Oral Daily   calcium-vitamin D  1 tablet Oral Q breakfast   DULoxetine  120 mg Oral Daily   enoxaparin (LOVENOX) injection  40 mg Subcutaneous N47S   folic acid  1 mg Oral Daily   multivitamin with minerals  1 tablet Oral Daily   Ospemifene  1 tablet Oral Daily   pantoprazole  40 mg Oral Daily   potassium chloride  40 mEq Oral Once   rosuvastatin  20 mg Oral  Weekly    Allergies:  Allergies  Allergen Reactions   Morphine Other (See Comments)    Stopped heart   Advair Diskus [Fluticasone-Salmeterol]     Sensitivity to smells   Ambien [Zolpidem Tartrate]     irritable   Amlodipine Besylate     Feels bad   Bextra [Valdecoxib]     Stomach issues   Captopril     Ache   Latex Itching    Bandaids only   Lisinopril     cough   Pulmicort [Budesonide]     Worse breathing, sensitivity to smells   Savella [Milnacipran Hcl]     Mental fog   Spiriva [Tiotropium Bromide Monohydrate]     Dry mouth    Social History   Socioeconomic History   Marital status: Married    Spouse name: Not on file   Number of children: 3   Years of education: Not on file   Highest education level: Not on file  Occupational History   Occupation: Retired    Fish farm manager: [employer]  Tobacco Use   Smoking status: Former    Packs/day: 2.00    Years: 40.00    Pack years: 80.00    Types: Cigarettes    Quit date: 02/24/1999    Years since quitting: 21.8   Smokeless tobacco: Never  Vaping Use   Vaping Use: Never used  Substance and Sexual Activity   Alcohol use: Not Currently    Comment:     Drug use: No   Sexual activity: Not on file  Other Topics Concern   Not on file  Social History Narrative   Lives with husband.  5 children together. (She has 3).     Social Determinants of Health   Financial Resource Strain: Not on file  Food Insecurity: Not on file  Transportation Needs: Not on file  Physical Activity: Not on file  Stress: Not on file  Social Connections: Not on file  Intimate Partner Violence: Not on file     Family History  Problem Relation Age of Onset   Hypertension Mother    Diabetes Mother    Allergies Mother    Heart attack Father 30   Cancer Father        kidney   Emphysema Father    Heart disease Father        CHF age 73   Heart attack Brother 37       Died of MI age 22     Review of Systems: All other systems reviewed and  are otherwise negative except as noted above.  Physical Exam: Vitals:   12/12/20 0315 12/12/20 0524 12/12/20 0700 12/12/20 0715  BP: 119/62 127/73 120/60 (!) 118/48  Pulse: 79 72 73 73  Resp: 19 18 18 18   Temp:  TempSrc:      SpO2: 96% 99% 94% 95%  Weight:      Height:        GEN- The patient is well appearing, alert and oriented x 3 today.   HEENT: normocephalic, atraumatic; sclera clear, conjunctiva pink; hearing intact; oropharynx clear; neck supple Lungs- Clear to ausculation bilaterally, normal work of breathing.  No wheezes, rales, rhonchi Heart- Regular rate and rhythm, no murmurs, rubs or gallops GI- soft, non-tender, non-distended, bowel sounds present Extremities- no clubbing, cyanosis, or edema; DP/PT/radial pulses 2+ bilaterally MS- no significant deformity or atrophy Skin- warm and dry, no rash or lesion Psych- euthymic mood, full affect Neuro- strength and sensation are intact  Labs:   Lab Results  Component Value Date   WBC 5.7 12/12/2020   HGB 12.8 12/12/2020   HCT 38.5 12/12/2020   MCV 93.4 12/12/2020   PLT 147 (L) 12/12/2020    Recent Labs  Lab 12/11/20 2033 12/12/20 0429  NA  --  135  K  --  3.4*  CL  --  103  CO2  --  23  BUN  --  15  CREATININE  --  0.73  CALCIUM  --  8.6*  PROT 5.6*  --   BILITOT 1.0  --   ALKPHOS 43  --   ALT 12  --   AST 16  --   GLUCOSE  --  97      Radiology/Studies: DG Chest Portable 1 View  Result Date: 12/11/2020 CLINICAL DATA:  Syncope, ventricular tachycardia EXAM: PORTABLE CHEST 1 VIEW COMPARISON:  12/28/2017 FINDINGS: Heart size upper normal. Vascularity normal. Atherosclerotic aortic arch. Lungs clear without infiltrate or effusion. IMPRESSION: No active disease. Electronically Signed   By: Franchot Gallo M.D.   On: 12/11/2020 19:16    EKG:on arrival shows NSR at 97 bpm with PVCs in trigeminy. PVC transition between V1 and V2 and is RBBB-oid in V1. (personally reviewed)  TELEMETRY: NSR 90s with  occasional PVCs, NSVT, and sustained runs of monomorphic VT (personally reviewed)  Assessment/Plan: 1.  Ventricular tachycardia Continues to have intermittent monormorphic VT, PVCs, and NSVT K 3.4 and Mg 1.4. Supp for both ordered If has disease that is able to be revascularized. If cath unremarkable will need ICD for secondary prevention of VT.  TTE pending.  2. Non-obstructive CAD LHC 05/2017 with Ost LAD to Prox LAD lesion is 5% stenosed In the setting of new VT will need LHC Will obtain Echo as well.   3. COPD Uses albuterol  4. FM / RA Uses duloxetine 120 mg daily and Norco q 6 hours  For questions or updates, please contact [facility] Please consult [email or web address] for contact info under Cardiology/STEMI.  Jacalyn Lefevre, PA-C  12/12/2020 7:57 AM

## 2020-12-12 NOTE — Progress Notes (Signed)
Received call from central telemetry that patient was in [place]. Upon my arrival to patient's room, patient was in NSR. She was awake and alert. BP 128/87. Patient states she felt some chest tightness but she did not have any feelings of pre-syncope during the run of Vtach.   Reviewed monitor; patient had a run of Vtach that lasted for about a minute. Notified Oda Kilts PA via phone; received orders to recheck BMET and magnesium levels, continue to monitor patient, and if patient has any recurrent Vtach, notify on call provider for cardiology.

## 2020-12-12 NOTE — Progress Notes (Signed)
Patient had two sustained episodes of Vtach (HR in 160s) around 8:20 pm and 8:50 pm. She was symptomatic during them complaining of palpitations- thankfully they broke on their own  Will transfer to ICU for closer monitoring Will re bolus with Amio Will bolus with Lidocaine 50 and start a drip of Lidocaine 1 as well Will recheck BMP NPO at midnight. Suspect she will need EP study/ablation tomorrow.

## 2020-12-12 NOTE — Progress Notes (Signed)
Asked to assist with starting lidocaine gtt and transferring pt to ICU for intermittent VT. 50mg  lidocaine bolus given and lidocaine gtt started at 1mg . Pt's 2nd 150mg  Amiodarone bolus given by bedside RN prior to lidocaine bolus. Pt transferred to [place].

## 2020-12-12 NOTE — Consult Note (Signed)
ELECTROPHYSIOLOGY CONSULT NOTE    Patient ID: Kelsey Diaz MRN: [id number], DOB/AGE: 09-15-1945 75 y.o.  Admit date: 12/11/2020 Date of Consult: 12/12/2020  Primary Physician: Harlan Stains, MD Primary Cardiologist: Minus Breeding, MD  Electrophysiologist:  New to Dr. Quentin Ore  Referring Provider: Dr. Johnsie Cancel  Patient Profile: Kelsey Diaz is a 75 y.o. female with a history of non-obstructive CAD, HLD, OSA not on CPAP, HTN, RA, and FM who is being seen today for the evaluation of VT at the request of Dr. Johnsie Cancel.  HPI:  Kelsey Diaz is a 75 y.o. female with medical history as above.  Has felt "generally unwell" x 1 year. Has intermittent episodes of sudden weakness at times associated chest tightness, palpitations, and SOB.  She has described them as "losing consciousness" but also that she doesn't fall all the way to the ground. Episodes usually last ~ 1 minute.  She lost her husband around the time these symptoms started so attributed most of it to grief.    For the past 2 weeks the symptoms have been more frequent and, yesterday, more severe. Mostly upon standing.   Yesterday pt was at home and once again felt the sensation of passing out causing her to fall to her knees and feeling unstable. She called EMS and was found to be in "monomorphic VT" with a HR of 196 bpm. Given 150 mg IV bolus and VT broke. She denies focal weakness, urinary symptoms, peripheral edema, PND, or orthopnea. She had COVID about 5 weeks ago. She tested positive again today (likely residual) but is asymptomatic. Former smoker but quit "years ago"  Had [facility] 05/2017 for atypical chest pain with minimal NOCAD. (Myoview preceding was inconclusive)  EF "45-54" by [facility] 05/2017  Pertinent labs include  COVID + (Likely residual with symptomatic case 5 weeks ago HS trop 15->33 K 3.8 -> 3.4 Cr 0.78 Mg 1.7 -> 1.4 TSH 0.054   Free T4 PENDING  Currently, she is feeling OK. Repeats the history above  with this episode yesterday being the most severe episode she has had.    Past Medical History:  Diagnosis Date   Allergic rhinitis    Anxiety    Asthma    Bilateral hip pain 11/28/2018   Colitis    COPD (chronic obstructive pulmonary disease) ([place])    Fibromyalgia 05/16/2019   GERD (gastroesophageal reflux disease)    With esophageal strictures   Hypertension    Hyperthyroidism    OSA (obstructive sleep apnea) 02/09/2012   No mask.  Did not tolerate   Osteoarthritis    Rheumatoid arthritis ([place]) 10/03/2018     Surgical History:  Past Surgical History:  Procedure Laterality Date   BREAST BIOPSY     x2   CATARACT EXTRACTION W/PHACO Left 09/27/2018   Procedure: CATARACT EXTRACTION PHACO AND INTRAOCULAR LENS PLACEMENT ([place]) LEFT;  Surgeon: Birder Robson, MD;  Location: [place];  Service: Ophthalmology;  Laterality: Left;   CATARACT EXTRACTION W/PHACO Right 11/29/2018   Procedure: CATARACT EXTRACTION PHACO AND INTRAOCULAR LENS PLACEMENT ([place]) RIGHT;  Surgeon: Birder Robson, MD;  Location: [place];  Service: Ophthalmology;  Laterality: Right;  1:22 20.1% 16.64   CHOLECYSTECTOMY     2005   KNEE SURGERY Right 2013   two torn ligaments repaired.    LEFT HEART CATH AND CORONARY ANGIOGRAPHY N/A 06/08/2017   Procedure: LEFT HEART CATH AND CORONARY ANGIOGRAPHY;  Surgeon: Troy Sine, MD;  Location: [place] [facility];  Service: Cardiovascular;  Laterality: N/A;   NASAL SINUS SURGERY     VAGINAL HYSTERECTOMY       (Not in a hospital admission)   Inpatient Medications:   aspirin EC  81 mg Oral Daily   calcium-vitamin D  1 tablet Oral Q breakfast   DULoxetine  120 mg Oral Daily   enoxaparin (LOVENOX) injection  40 mg Subcutaneous Y80X   folic acid  1 mg Oral Daily   multivitamin with minerals  1 tablet Oral Daily   Ospemifene  1 tablet Oral Daily   pantoprazole  40 mg Oral Daily   potassium chloride  40 mEq Oral Once   rosuvastatin  20 mg Oral  Weekly    Allergies:  Allergies  Allergen Reactions   Morphine Other (See Comments)    Stopped heart   Advair Diskus [Fluticasone-Salmeterol]     Sensitivity to smells   Ambien [Zolpidem Tartrate]     irritable   Amlodipine Besylate     Feels bad   Bextra [Valdecoxib]     Stomach issues   Captopril     Ache   Latex Itching    Bandaids only   Lisinopril     cough   Pulmicort [Budesonide]     Worse breathing, sensitivity to smells   Savella [Milnacipran Hcl]     Mental fog   Spiriva [Tiotropium Bromide Monohydrate]     Dry mouth    Social History   Socioeconomic History   Marital status: Married    Spouse name: Not on file   Number of children: 3   Years of education: Not on file   Highest education level: Not on file  Occupational History   Occupation: Retired    Fish farm manager: [employer]  Tobacco Use   Smoking status: Former    Packs/day: 2.00    Years: 40.00    Pack years: 80.00    Types: Cigarettes    Quit date: 02/24/1999    Years since quitting: 21.8   Smokeless tobacco: Never  Vaping Use   Vaping Use: Never used  Substance and Sexual Activity   Alcohol use: Not Currently    Comment:     Drug use: No   Sexual activity: Not on file  Other Topics Concern   Not on file  Social History Narrative   Lives with husband.  5 children together. (She has 3).     Social Determinants of Health   Financial Resource Strain: Not on file  Food Insecurity: Not on file  Transportation Needs: Not on file  Physical Activity: Not on file  Stress: Not on file  Social Connections: Not on file  Intimate Partner Violence: Not on file     Family History  Problem Relation Age of Onset   Hypertension Mother    Diabetes Mother    Allergies Mother    Heart attack Father 9   Cancer Father        kidney   Emphysema Father    Heart disease Father        CHF age 44   Heart attack Brother 81       Died of MI age 49     Review of Systems: All other systems reviewed and  are otherwise negative except as noted above.  Physical Exam: Vitals:   12/12/20 0315 12/12/20 0524 12/12/20 0700 12/12/20 0715  BP: 119/62 127/73 120/60 (!) 118/48  Pulse: 79 72 73 73  Resp: 19 18 18 18   Temp:  TempSrc:      SpO2: 96% 99% 94% 95%  Weight:      Height:        GEN- The patient is well appearing, alert and oriented x 3 today.   HEENT: normocephalic, atraumatic; sclera clear, conjunctiva pink; hearing intact; oropharynx clear; neck supple Lungs- Clear to ausculation bilaterally, normal work of breathing.  No wheezes, rales, rhonchi Heart- Regular rate and rhythm, no murmurs, rubs or gallops GI- soft, non-tender, non-distended, bowel sounds present Extremities- no clubbing, cyanosis, or edema; DP/PT/radial pulses 2+ bilaterally MS- no significant deformity or atrophy Skin- warm and dry, no rash or lesion Psych- euthymic mood, full affect Neuro- strength and sensation are intact  Labs:   Lab Results  Component Value Date   WBC 5.7 12/12/2020   HGB 12.8 12/12/2020   HCT 38.5 12/12/2020   MCV 93.4 12/12/2020   PLT 147 (L) 12/12/2020    Recent Labs  Lab 12/11/20 2033 12/12/20 0429  NA  --  135  K  --  3.4*  CL  --  103  CO2  --  23  BUN  --  15  CREATININE  --  0.73  CALCIUM  --  8.6*  PROT 5.6*  --   BILITOT 1.0  --   ALKPHOS 43  --   ALT 12  --   AST 16  --   GLUCOSE  --  97      Radiology/Studies: DG Chest Portable 1 View  Result Date: 12/11/2020 CLINICAL DATA:  Syncope, ventricular tachycardia EXAM: PORTABLE CHEST 1 VIEW COMPARISON:  12/28/2017 FINDINGS: Heart size upper normal. Vascularity normal. Atherosclerotic aortic arch. Lungs clear without infiltrate or effusion. IMPRESSION: No active disease. Electronically Signed   By: Franchot Gallo M.D.   On: 12/11/2020 19:16    EKG:on arrival shows NSR at 97 bpm with PVCs in trigeminy. PVC transition between V1 and V2 and is RBBB-oid in V1. (personally reviewed)  TELEMETRY: NSR 90s with  occasional PVCs, NSVT, and sustained runs of monomorphic VT (personally reviewed)  Assessment/Plan: 1.  Ventricular tachycardia Continues to have intermittent monormorphic VT, PVCs, and NSVT K 3.4 and Mg 1.4. Supp for both ordered If has disease that is able to be revascularized. If cath unremarkable will need ICD for secondary prevention of VT.  TTE pending.  2. Non-obstructive CAD LHC 05/2017 with Ost LAD to Prox LAD lesion is 5% stenosed In the setting of new VT will need LHC Will obtain Echo as well.   3. COPD Uses albuterol  4. FM / RA Uses duloxetine 120 mg daily and Norco q 6 hours  For questions or updates, please contact [facility] Please consult [email or web address] for contact info under Cardiology/STEMI.  Jacalyn Lefevre, PA-C  12/12/2020 7:57 AM

## 2020-12-12 NOTE — Progress Notes (Signed)
Instructed nurse to place iWatch to IV site infusing Amiodarone.

## 2020-12-12 NOTE — ED Notes (Signed)
EDP Trifan called to bedside, pt noticed to be in [place]. Monitor shows it was for approx 1 min & 30 sec.

## 2020-12-12 NOTE — Progress Notes (Signed)
  Echocardiogram 2D Echocardiogram has been performed.  Merrie Roof F 12/12/2020, 5:48 PM

## 2020-12-13 ENCOUNTER — Encounter (HOSPITAL_COMMUNITY): Payer: Self-pay | Admitting: Cardiovascular Disease

## 2020-12-13 ENCOUNTER — Inpatient Hospital Stay (HOSPITAL_COMMUNITY): Payer: Medicare HMO

## 2020-12-13 DIAGNOSIS — I472 Ventricular tachycardia, unspecified: Secondary | ICD-10-CM

## 2020-12-13 LAB — BASIC METABOLIC PANEL
Anion gap: 9 (ref 5–15)
BUN: 9 mg/dL (ref 8–23)
CO2: 22 mmol/L (ref 22–32)
Calcium: 8.8 mg/dL — ABNORMAL LOW (ref 8.9–10.3)
Chloride: 105 mmol/L (ref 98–111)
Creatinine, Ser: 0.66 mg/dL (ref 0.44–1.00)
GFR, Estimated: >60 mL/min (ref >60)
Glucose, Bld: 119 mg/dL — ABNORMAL HIGH (ref 70–99)
Potassium: 4.3 mmol/L (ref 3.5–5.1)
Sodium: 136 mmol/L (ref 135–145)

## 2020-12-13 LAB — LIDOCAINE LEVEL: Lidocaine Lvl: 1 ug/mL — ABNORMAL LOW (ref 1.5–5.0)

## 2020-12-13 LAB — GLUCOSE, CAPILLARY: Glucose-Capillary: 250 mg/dL — ABNORMAL HIGH (ref 70–99)

## 2020-12-13 LAB — MRSA NEXT GEN BY PCR, NASAL: MRSA by PCR Next Gen: NOT DETECTED

## 2020-12-13 LAB — MAGNESIUM: Magnesium: 1.8 mg/dL (ref 1.7–2.4)

## 2020-12-13 MED ORDER — GADOBUTROL 1 MMOL/ML IV SOLN
10.0000 mL | Freq: Once | INTRAVENOUS | Status: AC | PRN
  Administered 2020-12-13: 10 mL via INTRAVENOUS

## 2020-12-13 MED ORDER — METOPROLOL TARTRATE 12.5 MG HALF TABLET
12.5000 mg | ORAL_TABLET | Freq: Three times a day (TID) | ORAL | Status: DC
  Administered 2020-12-13 (×3): 12.5 mg via ORAL
  Filled 2020-12-13 (×2): qty 1

## 2020-12-13 MED ORDER — AMIODARONE LOAD VIA INFUSION
150.0000 mg | Freq: Once | INTRAVENOUS | Status: AC
  Administered 2020-12-13: 150 mg via INTRAVENOUS

## 2020-12-13 MED ORDER — MAGNESIUM SULFATE 2 GM/50ML IV SOLN
2.0000 g | Freq: Once | INTRAVENOUS | Status: AC
  Administered 2020-12-13: 2 g via INTRAVENOUS
  Filled 2020-12-13: qty 50

## 2020-12-13 MED ORDER — AMIODARONE HCL IN DEXTROSE 360-4.14 MG/200ML-% IV SOLN
30.0000 mg/h | INTRAVENOUS | Status: AC
  Administered 2020-12-13 (×2): 30 mg/h via INTRAVENOUS
  Filled 2020-12-13 (×2): qty 200

## 2020-12-13 MED ORDER — AMIODARONE HCL IN DEXTROSE 360-4.14 MG/200ML-% IV SOLN
60.0000 mg/h | INTRAVENOUS | Status: AC
  Administered 2020-12-13: 60 mg/h via INTRAVENOUS

## 2020-12-13 NOTE — Progress Notes (Signed)
Electrophysiology Rounding Note  Patient Name: Kelsey Diaz Date of Encounter: 12/13/2020  Primary Cardiologist: Minus Breeding, MD Electrophysiologist: Dr. Quentin Ore   Subjective   The patient is doing well this am. Didn't sleep well. [place].  At this time, the patient denies chest pain, shortness of breath, or any new concerns.  Inpatient Medications    Scheduled Meds:  aspirin EC  81 mg Oral Daily   calcium-vitamin D  1 tablet Oral Q breakfast   Chlorhexidine Gluconate Cloth  6 each Topical Daily   DULoxetine  120 mg Oral Daily   enoxaparin (LOVENOX) injection  40 mg Subcutaneous Z61W   folic acid  1 mg Oral Daily   influenza vaccine adjuvanted  0.5 mL Intramuscular Tomorrow-1000   multivitamin with minerals  1 tablet Oral Daily   pantoprazole  40 mg Oral Daily   rosuvastatin  20 mg Oral Weekly   sodium chloride flush  3 mL Intravenous Q12H   Continuous Infusions:  sodium chloride     amiodarone 30 mg/hr (12/13/20 0651)   lidocaine 1 mg/min (12/13/20 0600)   magnesium sulfate bolus IVPB 2 g (12/13/20 0802)   PRN Meds: sodium chloride, acetaminophen, albuterol, HYDROcodone-acetaminophen, ondansetron (ZOFRAN) IV, sodium chloride flush   Vital Signs    Vitals:   12/13/20 0430 12/13/20 0500 12/13/20 0606 12/13/20 0800  BP: 136/85 102/78 (!) 143/74   Pulse: 74 76 75   Resp: (!) 21 17 (!) 21   Temp:    98.3 F (36.8 C)  TempSrc:    Oral  SpO2: 97% 99% 93%   Weight:      Height:        Intake/Output Summary (Last 24 hours) at 12/13/2020 0833 Last data filed at 12/13/2020 0600 Gross per 24 hour  Intake 2623.56 ml  Output 800 ml  Net 1823.56 ml   Filed Weights   12/11/20 1830 12/11/20 1939  Weight: 90.7 kg 100 kg    Physical Exam    GEN- The patient is well appearing, alert and oriented x 3 today.   Head- normocephalic, atraumatic Eyes-  Sclera clear, conjunctiva pink Ears- hearing intact Oropharynx- clear Neck- supple Lungs- Clear to ausculation  bilaterally, normal work of breathing Heart- Regular rate and rhythm, no murmurs, rubs or gallops GI- soft, NT, ND, + BS Extremities- no clubbing or cyanosis. No edema Skin- no rash or lesion Psych- euthymic mood, full affect Neuro- strength and sensation are intact  Labs    CBC Recent Labs    12/11/20 1825 12/12/20 0429  WBC 5.2 5.7  HGB 14.5 12.8  HCT 44.0 38.5  MCV 94.8 93.4  PLT 150 960*   Basic Metabolic Panel Recent Labs    12/12/20 1648 12/12/20 2124 12/13/20 0640  NA 137 136 136  K 3.8 4.4 4.3  CL 103 108 105  CO2 24 23 22   GLUCOSE 132* 129* 119*  BUN 11 13 9   CREATININE 0.69 0.70 0.66  CALCIUM 8.8* 8.4* 8.8*  MG 2.3  --  1.8   Liver Function Tests Recent Labs    12/11/20 2033  AST 16  ALT 12  ALKPHOS 43  BILITOT 1.0  PROT 5.6*  ALBUMIN 3.2*   No results for input(s): LIPASE, AMYLASE in the last 72 hours. Cardiac Enzymes No results for input(s): CKTOTAL, CKMB, CKMBINDEX, TROPONINI in the last 72 hours.   Telemetry    NSR 70s mostly, continues to have intermittent monomorphic VT, but quiescent since last night ~ 0100 (personally reviewed)  Radiology    CARDIAC CATHETERIZATION  Result Date: 12/12/2020 Images from the original result were not included. Kelsey Diaz is a 75 y.o. female  [id number] LOCATION:  FACILITY: [facility] PHYSICIAN: Quay Burow, M.D. October 09, 1945 DATE OF PROCEDURE:  12/12/2020 DATE OF DISCHARGE: CARDIAC CATHETERIZATION History obtained from chart review.  75 year old moderately overweight Caucasian female admitted with ventricular tachycardia and syncope.  She did have a cardiac catheterization performed by Dr. Claiborne Billings 4/19 that showed nonobstructive disease.  She denies chest pain.  She had COVID within the last 5 weeks.  She was hypokalemic and hypomagnesemic currently being repleted.  She is on amiodarone drip.  She presents for diagnostic coronary angiography to rule out an ischemic etiology.   Ms. Rawlinson has essentially  normal coronary arteries and normal filling pressures.  Her ventricular tachycardia is not ischemically mediated.  It may be related to electrolyte abnormalities.  She was hypomagnesemic and hypokalemic and has been repleted.  Further evaluation per the EP service.  Dr. Lars Mage has been notified.  The sheath was removed and a TR band was placed on the right wrist to achieve patent hemostasis.  The patient left lab in stable condition. Quay Burow. MD, [facility] 12/12/2020 2:55 PM    DG Chest Portable 1 View  Result Date: 12/11/2020 CLINICAL DATA:  Syncope, ventricular tachycardia EXAM: PORTABLE CHEST 1 VIEW COMPARISON:  12/28/2017 FINDINGS: Heart size upper normal. Vascularity normal. Atherosclerotic aortic arch. Lungs clear without infiltrate or effusion. IMPRESSION: No active disease. Electronically Signed   By: Franchot Gallo M.D.   On: 12/11/2020 19:16   ECHOCARDIOGRAM COMPLETE  Result Date: 12/12/2020    ECHOCARDIOGRAM REPORT   Patient Name:   Kelsey Diaz Date of Exam: 12/12/2020 Medical Rec #:  [id number]        Height:       67.0 in Accession #:    [id number]       Weight:       220.5 lb Date of Birth:  April 30, 1945       BSA:          2.108 m Patient Age:    38 years         BP:           122/68 mmHg Patient Gender: F                HR:           89 bpm. Exam Location:  Inpatient Procedure: 2D Echo, Cardiac Doppler and Color Doppler Indications:    Ventricular tachycardia  History:        Patient has no prior history of Echocardiogram examinations.                 Cardiac cath today.  Sonographer:    Merrie Roof RDCS Referring Phys: [id number] [place]  1. Left ventricular ejection fraction, by estimation, is 60 to 65%. The left ventricle has normal function. The left ventricle has no regional wall motion abnormalities. Left ventricular diastolic parameters are consistent with Grade I diastolic dysfunction (impaired relaxation).  2. Right ventricular systolic  function is normal. The right ventricular size is normal.  3. Left atrial size was mild to moderately dilated.  4. The mitral valve is normal in structure. No evidence of mitral valve regurgitation. No evidence of mitral stenosis.  5. The aortic valve is tricuspid. There is mild calcification of the aortic valve. Aortic valve regurgitation is not visualized. Mild to moderate aortic valve  sclerosis/calcification is present, without any evidence of aortic stenosis.  6. The inferior vena cava is normal in size with greater than 50% respiratory variability, suggesting right atrial pressure of 3 mmHg. FINDINGS  Left Ventricle: Left ventricular ejection fraction, by estimation, is 60 to 65%. The left ventricle has normal function. The left ventricle has no regional wall motion abnormalities. The left ventricular internal cavity size was normal in size. There is  no left ventricular hypertrophy. Left ventricular diastolic parameters are consistent with Grade I diastolic dysfunction (impaired relaxation). Right Ventricle: The right ventricular size is normal. No increase in right ventricular wall thickness. Right ventricular systolic function is normal. Left Atrium: Left atrial size was mild to moderately dilated. Right Atrium: Right atrial size was normal in size. Pericardium: There is no evidence of pericardial effusion. Mitral Valve: The mitral valve is normal in structure. No evidence of mitral valve regurgitation. No evidence of mitral valve stenosis. Tricuspid Valve: The tricuspid valve is normal in structure. Tricuspid valve regurgitation is trivial. No evidence of tricuspid stenosis. Aortic Valve: The aortic valve is tricuspid. There is mild calcification of the aortic valve. Aortic valve regurgitation is not visualized. Mild to moderate aortic valve sclerosis/calcification is present, without any evidence of aortic stenosis. Aortic valve mean gradient measures 5.0 mmHg. Aortic valve peak gradient measures 9.5 mmHg.  Aortic valve area, by VTI measures 2.70 cm. Pulmonic Valve: The pulmonic valve was normal in structure. Pulmonic valve regurgitation is not visualized. No evidence of pulmonic stenosis. Aorta: The aortic root is normal in size and structure. Venous: The inferior vena cava is normal in size with greater than 50% respiratory variability, suggesting right atrial pressure of 3 mmHg. IAS/Shunts: No atrial level shunt detected by color flow Doppler.  LEFT VENTRICLE PLAX 2D LVIDd:         4.00 cm   Diastology LVIDs:         2.70 cm   LV e' medial:    6.20 cm/s LV PW:         1.20 cm   LV E/e' medial:  13.4 LV IVS:        1.10 cm   LV e' lateral:   15.70 cm/s LVOT diam:     2.10 cm   LV E/e' lateral: 5.3 LV SV:         72 LV SV Index:   34 LVOT Area:     3.46 cm  RIGHT VENTRICLE RV Basal diam:  2.60 cm LEFT ATRIUM             Index        RIGHT ATRIUM          Index LA diam:        3.80 cm 1.80 cm/m   RA Area:     9.34 cm LA Vol (A2C):   51.4 ml 24.39 ml/m  RA Volume:   17.40 ml 8.26 ml/m LA Vol (A4C):   53.3 ml 25.29 ml/m LA Biplane Vol: 55.7 ml 26.43 ml/m  AORTIC VALVE AV Area (Vmax):    2.70 cm AV Area (Vmean):   2.64 cm AV Area (VTI):     2.70 cm AV Vmax:           154.00 cm/s AV Vmean:          103.000 cm/s AV VTI:            0.268 m AV Peak Grad:      9.5 mmHg AV Mean Grad:  5.0 mmHg LVOT Vmax:         120.00 cm/s LVOT Vmean:        78.400 cm/s LVOT VTI:          0.209 m LVOT/AV VTI ratio: 0.78  AORTA Ao Root diam: 3.40 cm Ao Asc diam:  3.30 cm MITRAL VALVE MV Area (PHT): 3.68 cm     SHUNTS MV Decel Time: 206 msec     Systemic VTI:  0.21 m MV E velocity: 83.00 cm/s   Systemic Diam: 2.10 cm MV A velocity: 119.00 cm/s MV E/A ratio:  0.70 Glori Bickers MD Electronically signed by Glori Bickers MD Signature Date/Time: 12/12/2020/8:01:01 PM    Final     Patient Profile     Kelsey Diaz is a 75 y.o. female with a history of non-obstructive CAD, HLD, OSA not on CPAP, HTN, RA, and FM who is being  seen today for the evaluation of VT at the request of Dr. Johnsie Cancel.  Assessment & Plan    1.  Ventricular tachycardia Continues to have intermittent monormorphic VT, PVCs, and NSVT Cath 12/12/20 with no CAD Echo with normal EF My 1.8. Supp. K stable.  Will plan cMRI today for further planning. If inflammation may be able to just suppress with amio with management of underlying condition. If no inflammation, will plan EP study +/- ablation on Monday.    2. Non-obstructive CAD [facility] 12/12/20 this admission with no CAD Echo with normal EF.    3. COPD Home meds   4. FM / RA Home meds Enbrel on hold (Takes once weekly)  For questions or updates, please contact [place] Please consult [email or web address] for contact info under Cardiology/STEMI.  Signed, Shirley Friar, PA-C  12/13/2020, 8:33 AM

## 2020-12-13 NOTE — Progress Notes (Signed)
  Paged for approximately 90 seconds of MMVT. Pt symptoms as prior.   Mg 1.8 this am. Supp ordered. Will start now.   Consider re-bolus amio if recurs.   Legrand Como [address] [place], PA-C  12/13/2020 8:43 AM

## 2020-12-13 NOTE — Progress Notes (Signed)
Pt had a 2 minute run of V-tach with palpitations and pale in appearance when this RN entered the room. Humphrey Rolls, MD made aware. See new orders. Will continue to monitor.   Elaina Hoops, RN

## 2020-12-13 NOTE — Plan of Care (Signed)
  Problem: Education: Goal: Knowledge of disease or condition will improve Outcome: Progressing Goal: Understanding of medication regimen will improve Outcome: Progressing Goal: Individualized Educational Video(s) Outcome: Progressing   Problem: Activity: Goal: Ability to tolerate increased activity will improve Outcome: Progressing   Problem: Cardiac: Goal: Ability to achieve and maintain adequate cardiopulmonary perfusion will improve Outcome: Progressing   Problem: Health Behavior/Discharge Planning: Goal: Ability to safely manage health-related needs after discharge will improve Outcome: Progressing   Problem: Education: Goal: Knowledge of General Education information will improve Description: Including pain rating scale, medication(s)/side effects and non-pharmacologic comfort measures Outcome: Progressing   Problem: Health Behavior/Discharge Planning: Goal: Ability to manage health-related needs will improve Outcome: Progressing   Problem: Clinical Measurements: Goal: Ability to maintain clinical measurements within normal limits will improve Outcome: Progressing Goal: Will remain free from infection Outcome: Progressing Goal: Diagnostic test results will improve Outcome: Progressing Goal: Respiratory complications will improve Outcome: Progressing Goal: Cardiovascular complication will be avoided Outcome: Progressing   Problem: Activity: Goal: Risk for activity intolerance will decrease Outcome: Progressing   Problem: Nutrition: Goal: Adequate nutrition will be maintained Outcome: Progressing   Problem: Coping: Goal: Level of anxiety will decrease Outcome: Progressing   Problem: Pain Managment: Goal: General experience of comfort will improve Outcome: Progressing   Problem: Safety: Goal: Ability to remain free from injury will improve Outcome: Progressing   Problem: Skin Integrity: Goal: Risk for impaired skin integrity will decrease Outcome:  Progressing

## 2020-12-13 NOTE — Progress Notes (Signed)
Patient had two sustained episodes of Vtach (HR in 160s-180s) approximately at 0100. Patient AxOx4, symptomatic c/o chest pain. Paged cardiology on call - verbal order for bolus of amiodarone and to restart amio protocol. Patient converted back to NSR. Will continue to monitor throughout shift.

## 2020-12-13 NOTE — Progress Notes (Signed)
  Pt continues to have 90-120 second runs of sustained VT. Symptomatic with mild chest pressure.   Lidocaine does not seem to be helping and pt complaining of hand tremors worse than baseline. Will stop Lidocaine.   OK to give next dose of toprol.   Reviewed above with Dr. Quentin Ore.   Legrand Como [address]" [place], PA-C  12/13/2020 3:08 PM

## 2020-12-13 NOTE — Progress Notes (Signed)
[place] visited pt. per [facility] consult to assist w/advance directive; pt. interested in completing form but she was taken to MRI before [employer] could provide education.  Pt. has document and will have staff page chaplains if needed.  Lindaann Pascal, Chaplain Pager: [phone]

## 2020-12-14 DIAGNOSIS — I472 Ventricular tachycardia, unspecified: Secondary | ICD-10-CM | POA: Diagnosis not present

## 2020-12-14 LAB — BASIC METABOLIC PANEL
Anion gap: 10 (ref 5–15)
BUN: 7 mg/dL — ABNORMAL LOW (ref 8–23)
CO2: 23 mmol/L (ref 22–32)
Calcium: 9.5 mg/dL (ref 8.9–10.3)
Chloride: 101 mmol/L (ref 98–111)
Creatinine, Ser: 0.65 mg/dL (ref 0.44–1.00)
GFR, Estimated: >60 mL/min (ref >60)
Glucose, Bld: 126 mg/dL — ABNORMAL HIGH (ref 70–99)
Potassium: 4.3 mmol/L (ref 3.5–5.1)
Sodium: 134 mmol/L — ABNORMAL LOW (ref 135–145)

## 2020-12-14 MED ORDER — METOPROLOL TARTRATE 12.5 MG HALF TABLET
12.5000 mg | ORAL_TABLET | Freq: Three times a day (TID) | ORAL | Status: AC
  Administered 2020-12-14 – 2020-12-15 (×2): 12.5 mg via ORAL
  Filled 2020-12-14 (×2): qty 1

## 2020-12-14 MED ORDER — METOPROLOL TARTRATE 25 MG PO TABS
25.0000 mg | ORAL_TABLET | Freq: Three times a day (TID) | ORAL | Status: DC
  Administered 2020-12-14: 25 mg via ORAL
  Filled 2020-12-14: qty 1

## 2020-12-14 NOTE — Plan of Care (Signed)
  Problem: Education: Goal: Knowledge of disease or condition will improve Outcome: Progressing Goal: Understanding of medication regimen will improve Outcome: Progressing Goal: Individualized Educational Video(s) Outcome: Progressing   Problem: Activity: Goal: Ability to tolerate increased activity will improve Outcome: Progressing   Problem: Cardiac: Goal: Ability to achieve and maintain adequate cardiopulmonary perfusion will improve Outcome: Progressing

## 2020-12-14 NOTE — Progress Notes (Signed)
Electrophysiology Rounding Note  Patient Name: Kelsey Diaz Date of Encounter: 12/14/2020  Primary Cardiologist: Minus Breeding, MD Electrophysiologist: Dr. Quentin Ore   Subjective  NAEO. No VT since yesterday mid day. cMR completed yesterday. Lidocaine stopped.  Inpatient Medications    Scheduled Meds:  aspirin EC  81 mg Oral Daily   calcium-vitamin D  1 tablet Oral Q breakfast   Chlorhexidine Gluconate Cloth  6 each Topical Daily   DULoxetine  120 mg Oral Daily   enoxaparin (LOVENOX) injection  40 mg Subcutaneous T59R   folic acid  1 mg Oral Daily   influenza vaccine adjuvanted  0.5 mL Intramuscular Tomorrow-1000   metoprolol tartrate  12.5 mg Oral TID   multivitamin with minerals  1 tablet Oral Daily   pantoprazole  40 mg Oral Daily   rosuvastatin  20 mg Oral Weekly   sodium chloride flush  3 mL Intravenous Q12H   Continuous Infusions:  sodium chloride     amiodarone 30 mg/hr (12/14/20 0600)   PRN Meds: sodium chloride, acetaminophen, albuterol, HYDROcodone-acetaminophen, ondansetron (ZOFRAN) IV, sodium chloride flush   Vital Signs    Vitals:   12/14/20 0300 12/14/20 0400 12/14/20 0500 12/14/20 0600  BP: 128/76 (!) 144/73  135/69  Pulse: (!) 58 (!) 25 66 (!) 58  Resp: (!) 21 (!) 24 (!) 23 17  Temp: 98.4 F (36.9 C)     TempSrc: Oral     SpO2: 96% (!) 82% 97% 94%  Weight:      Height:        Intake/Output Summary (Last 24 hours) at 12/14/2020 0733 Last data filed at 12/14/2020 0600 Gross per 24 hour  Intake 354.95 ml  Output 2450 ml  Net -2095.05 ml    Filed Weights   12/11/20 1830 12/11/20 1939  Weight: 90.7 kg 100 kg    Physical Exam    GEN- The patient is well appearing, alert and oriented x 3 today.   Head- normocephalic, atraumatic Eyes-  Sclera clear, conjunctiva pink Ears- hearing intact Oropharynx- clear Neck- supple Lungs- Clear to ausculation bilaterally, normal work of breathing Heart- Regular rate and rhythm, no murmurs,  rubs or gallops GI- soft, NT, ND, + BS Extremities- no clubbing or cyanosis. No edema Skin- no rash or lesion Psych- euthymic mood, full affect Neuro- strength and sensation are intact  Labs    CBC Recent Labs    12/11/20 1825 12/12/20 0429  WBC 5.2 5.7  HGB 14.5 12.8  HCT 44.0 38.5  MCV 94.8 93.4  PLT 150 147*    Basic Metabolic Panel Recent Labs    12/12/20 1648 12/12/20 2124 12/13/20 0640  NA 137 136 136  K 3.8 4.4 4.3  CL 103 108 105  CO2 24 23 22   GLUCOSE 132* 129* 119*  BUN 11 13 9   CREATININE 0.69 0.70 0.66  CALCIUM 8.8* 8.4* 8.8*  MG 2.3  --  1.8    Liver Function Tests Recent Labs    12/11/20 2033  AST 16  ALT 12  ALKPHOS 43  BILITOT 1.0  PROT 5.6*  ALBUMIN 3.2*    No results for input(s): LIPASE, AMYLASE in the last 72 hours. Cardiac Enzymes No results for input(s): CKTOTAL, CKMB, CKMBINDEX, TROPONINI in the last 72 hours.   Telemetry    NSR 70s mostly, continues to have intermittent monomorphic VT, but quiescent since last night ~ 0100 (personally reviewed)  Radiology    CARDIAC CATHETERIZATION  Result Date: 12/12/2020 Images from the original  result were not included. Kelsey Diaz is a 75 y.o. female  [id number] LOCATION:  FACILITY: [facility] PHYSICIAN: Quay Burow, M.D. 07/16/1945 DATE OF PROCEDURE:  12/12/2020 DATE OF DISCHARGE: CARDIAC CATHETERIZATION History obtained from chart review.  75 year old moderately overweight Caucasian female admitted with ventricular tachycardia and syncope.  She did have a cardiac catheterization performed by Dr. Claiborne Billings 4/19 that showed nonobstructive disease.  She denies chest pain.  She had COVID within the last 5 weeks.  She was hypokalemic and hypomagnesemic currently being repleted.  She is on amiodarone drip.  She presents for diagnostic coronary angiography to rule out an ischemic etiology.   Kelsey Diaz has essentially normal coronary arteries and normal filling pressures.  Her ventricular  tachycardia is not ischemically mediated.  It may be related to electrolyte abnormalities.  She was hypomagnesemic and hypokalemic and has been repleted.  Further evaluation per the EP service.  Dr. Lars Mage has been notified.  The sheath was removed and a TR band was placed on the right wrist to achieve patent hemostasis.  The patient left lab in stable condition. Quay Burow. MD, [facility] 12/12/2020 2:55 PM    MR CARDIAC MORPHOLOGY W WO CONTRAST  Result Date: 12/13/2020 CLINICAL DATA:  VT EXAM: CARDIAC MRI TECHNIQUE: The patient was scanned on a 1.5 Tesla Siemens magnet. A dedicated cardiac coil was used. Functional imaging was done using Fiesta sequences. 2,3, and 4 chamber views were done to assess for RWMA's. Modified Simpson's rule using a short axis stack was used to calculate an ejection fraction on a dedicated work Conservation officer, nature. The patient received 10 cc of Gadavist . After 10 minutes inversion recovery sequences were used to assess for infiltration and scar tissue. CONTRAST:  Gadavist FINDINGS: Mild LAE. Normal RA. No ASD/PFO. Normal aortic root 3.2 cm No pericardial effusion. Normal appearing cardiac valves with tri leaflet AV. Quantitative LVEF low normal with abnormal septal motion No defined RWMAls EF 53% (EDV 109 ESV 51 cc SV 57 cc) Normal RV size and function no diverticula or evidence of RV dysplasia RVEF normal 52% (EDV 78 cc ESV 37 cc SV 41 cc ) Delayed inversion recovery images showed non specific mild mid myocardial uptake slightly worse in the basal septum There was no pattern of uptake To suggest CAD/MI, sarcoid, or infiltrative DCM Native T2 measures are normal at 50 msec suggesting no edema from recent COVID infection IMPRESSION: 1. Low normal LVEF 53% with abnormal septal motion 2. Mild mid myocardial gadolinium uptake on delayed inversion recovery sequences slightly worse in basal septum non specific 3.  Normal RV size and function no evidence of RV dysplasia  RVEF 52% 4.  Normal cardiac valves 5.  Normal T2 50 msec suggesting no edema post COVID infection 6.  No pericardial effusion 7.  Normal aortic root 3.2 cm 8.  No ASD/PFO [place] Electronically Signed   By: Jenkins Rouge M.D.   On: 12/13/2020 14:14   ECHOCARDIOGRAM COMPLETE  Result Date: 12/12/2020    ECHOCARDIOGRAM REPORT   Patient Name:   LIANNAH YARBOUGH Date of Exam: 12/12/2020 Medical Rec #:  [id number]        Height:       67.0 in Accession #:    [id number]       Weight:       220.5 lb Date of Birth:  [birth year]       BSA:          2.108 m Patient Age:  74 years         BP:           122/68 mmHg Patient Gender: F                HR:           89 bpm. Exam Location:  Inpatient Procedure: 2D Echo, Cardiac Doppler and Color Doppler Indications:    Ventricular tachycardia  History:        Patient has no prior history of Echocardiogram examinations.                 Cardiac cath today.  Sonographer:    Merrie Roof RDCS Referring Phys: [id number] [place]  1. Left ventricular ejection fraction, by estimation, is 60 to 65%. The left ventricle has normal function. The left ventricle has no regional wall motion abnormalities. Left ventricular diastolic parameters are consistent with Grade I diastolic dysfunction (impaired relaxation).  2. Right ventricular systolic function is normal. The right ventricular size is normal.  3. Left atrial size was mild to moderately dilated.  4. The mitral valve is normal in structure. No evidence of mitral valve regurgitation. No evidence of mitral stenosis.  5. The aortic valve is tricuspid. There is mild calcification of the aortic valve. Aortic valve regurgitation is not visualized. Mild to moderate aortic valve sclerosis/calcification is present, without any evidence of aortic stenosis.  6. The inferior vena cava is normal in size with greater than 50% respiratory variability, suggesting right atrial pressure of 3 mmHg. FINDINGS  Left Ventricle:  Left ventricular ejection fraction, by estimation, is 60 to 65%. The left ventricle has normal function. The left ventricle has no regional wall motion abnormalities. The left ventricular internal cavity size was normal in size. There is  no left ventricular hypertrophy. Left ventricular diastolic parameters are consistent with Grade I diastolic dysfunction (impaired relaxation). Right Ventricle: The right ventricular size is normal. No increase in right ventricular wall thickness. Right ventricular systolic function is normal. Left Atrium: Left atrial size was mild to moderately dilated. Right Atrium: Right atrial size was normal in size. Pericardium: There is no evidence of pericardial effusion. Mitral Valve: The mitral valve is normal in structure. No evidence of mitral valve regurgitation. No evidence of mitral valve stenosis. Tricuspid Valve: The tricuspid valve is normal in structure. Tricuspid valve regurgitation is trivial. No evidence of tricuspid stenosis. Aortic Valve: The aortic valve is tricuspid. There is mild calcification of the aortic valve. Aortic valve regurgitation is not visualized. Mild to moderate aortic valve sclerosis/calcification is present, without any evidence of aortic stenosis. Aortic valve mean gradient measures 5.0 mmHg. Aortic valve peak gradient measures 9.5 mmHg. Aortic valve area, by VTI measures 2.70 cm. Pulmonic Valve: The pulmonic valve was normal in structure. Pulmonic valve regurgitation is not visualized. No evidence of pulmonic stenosis. Aorta: The aortic root is normal in size and structure. Venous: The inferior vena cava is normal in size with greater than 50% respiratory variability, suggesting right atrial pressure of 3 mmHg. IAS/Shunts: No atrial level shunt detected by color flow Doppler.  LEFT VENTRICLE PLAX 2D LVIDd:         4.00 cm   Diastology LVIDs:         2.70 cm   LV e' medial:    6.20 cm/s LV PW:         1.20 cm   LV E/e' medial:  13.4 LV IVS:  1.10  cm   LV e' lateral:   15.70 cm/s LVOT diam:     2.10 cm   LV E/e' lateral: 5.3 LV SV:         72 LV SV Index:   34 LVOT Area:     3.46 cm  RIGHT VENTRICLE RV Basal diam:  2.60 cm LEFT ATRIUM             Index        RIGHT ATRIUM          Index LA diam:        3.80 cm 1.80 cm/m   RA Area:     9.34 cm LA Vol (A2C):   51.4 ml 24.39 ml/m  RA Volume:   17.40 ml 8.26 ml/m LA Vol (A4C):   53.3 ml 25.29 ml/m LA Biplane Vol: 55.7 ml 26.43 ml/m  AORTIC VALVE AV Area (Vmax):    2.70 cm AV Area (Vmean):   2.64 cm AV Area (VTI):     2.70 cm AV Vmax:           154.00 cm/s AV Vmean:          103.000 cm/s AV VTI:            0.268 m AV Peak Grad:      9.5 mmHg AV Mean Grad:      5.0 mmHg LVOT Vmax:         120.00 cm/s LVOT Vmean:        78.400 cm/s LVOT VTI:          0.209 m LVOT/AV VTI ratio: 0.78  AORTA Ao Root diam: 3.40 cm Ao Asc diam:  3.30 cm MITRAL VALVE MV Area (PHT): 3.68 cm     SHUNTS MV Decel Time: 206 msec     Systemic VTI:  0.21 m MV E velocity: 83.00 cm/s   Systemic Diam: 2.10 cm MV A velocity: 119.00 cm/s MV E/A ratio:  0.70 Glori Bickers MD Electronically signed by Glori Bickers MD Signature Date/Time: 12/12/2020/8:01:01 PM    Final     Patient Profile     MARABETH MELLAND is a 75 y.o. female with a history of non-obstructive CAD, HLD, OSA not on CPAP, HTN, RA, and FM who is being seen today for the evaluation of VT at the request of Dr. Johnsie Cancel.  Assessment & Plan    1.  Ventricular tachycardia Quiescent since yesterday mid day. Lidocaine stopped. Amiodarone to stop today at noon. Metoprolol tartrate 25mg  PO TID. Will hold starting Sunday evening.  Tentatively planning for EP study/VT ablation Monday afternoon.   Risk, benefits, and alternatives to EP study and radiofrequency ablation for VT were also discussed in detail today. These risks include but are not limited to complete heart block, stroke, bleeding, vascular damage, tamponade, perforation, and death. The patient understands  these risk and wishes to proceed.  Carto and ICE are requested for the procedure.     2. Non-obstructive CAD [facility] 12/12/20 this admission with no CAD Echo with normal EF.    3. COPD Home meds   4. FM / RA Home meds Enbrel on hold (Takes once weekly)  For questions or updates, please contact [place] Please consult [email or web address] for contact info under Cardiology/STEMI.  Signed, Vickie Epley, MD  12/14/2020, 7:33 AM

## 2020-12-14 NOTE — Plan of Care (Signed)
  Problem: Activity: Goal: Ability to tolerate increased activity will improve Outcome: Progressing   Problem: Cardiac: Goal: Ability to achieve and maintain adequate cardiopulmonary perfusion will improve Outcome: Progressing   Problem: Health Behavior/Discharge Planning: Goal: Ability to safely manage health-related needs after discharge will improve Outcome: Progressing   Problem: Education: Goal: Knowledge of General Education information will improve Description: Including pain rating scale, medication(s)/side effects and non-pharmacologic comfort measures Outcome: Progressing

## 2020-12-15 DIAGNOSIS — I472 Ventricular tachycardia, unspecified: Secondary | ICD-10-CM | POA: Diagnosis not present

## 2020-12-15 LAB — BASIC METABOLIC PANEL
Anion gap: 7 (ref 5–15)
BUN: 8 mg/dL (ref 8–23)
CO2: 26 mmol/L (ref 22–32)
Calcium: 9.3 mg/dL (ref 8.9–10.3)
Chloride: 100 mmol/L (ref 98–111)
Creatinine, Ser: 0.6 mg/dL (ref 0.44–1.00)
GFR, Estimated: >60 mL/min (ref >60)
Glucose, Bld: 113 mg/dL — ABNORMAL HIGH (ref 70–99)
Potassium: 4.5 mmol/L (ref 3.5–5.1)
Sodium: 133 mmol/L — ABNORMAL LOW (ref 135–145)

## 2020-12-15 LAB — MAGNESIUM: Magnesium: 1.6 mg/dL — ABNORMAL LOW (ref 1.7–2.4)

## 2020-12-15 LAB — CBC
HCT: 42.5 % (ref 36.0–46.0)
Hemoglobin: 14.3 g/dL (ref 12.0–15.0)
MCH: 31.3 pg (ref 26.0–34.0)
MCHC: 33.6 g/dL (ref 30.0–36.0)
MCV: 93 fL (ref 80.0–100.0)
Platelets: 170 10*3/uL (ref 150–400)
RBC: 4.57 MIL/uL (ref 3.87–5.11)
RDW: 12.1 % (ref 11.5–15.5)
WBC: 6.5 10*3/uL (ref 4.0–10.5)
nRBC: 0 % (ref 0.0–0.2)

## 2020-12-15 MED ORDER — SODIUM CHLORIDE 0.9 % IV SOLN: INTRAVENOUS | Status: DC | PRN

## 2020-12-15 MED ORDER — MAGNESIUM SULFATE 4 GM/100ML IV SOLN
4.0000 g | Freq: Once | INTRAVENOUS | Status: AC
  Administered 2020-12-15: 4 g via INTRAVENOUS
  Filled 2020-12-15: qty 100

## 2020-12-15 NOTE — Progress Notes (Signed)
Electrophysiology Rounding Note  Patient Name: Kelsey Diaz Date of Encounter: 12/15/2020  Primary Cardiologist: Minus Breeding, MD Electrophysiologist: Dr. Quentin Ore   Subjective  NAEO. No VT overnight.  Inpatient Medications    Scheduled Meds:  aspirin EC  81 mg Oral Daily   calcium-vitamin D  1 tablet Oral Q breakfast   Chlorhexidine Gluconate Cloth  6 each Topical Daily   DULoxetine  120 mg Oral Daily   enoxaparin (LOVENOX) injection  40 mg Subcutaneous T25Q   folic acid  1 mg Oral Daily   influenza vaccine adjuvanted  0.5 mL Intramuscular Tomorrow-1000   metoprolol tartrate  12.5 mg Oral TID   multivitamin with minerals  1 tablet Oral Daily   pantoprazole  40 mg Oral Daily   rosuvastatin  20 mg Oral Weekly   sodium chloride flush  3 mL Intravenous Q12H   Continuous Infusions:  sodium chloride     PRN Meds: sodium chloride, acetaminophen, albuterol, HYDROcodone-acetaminophen, ondansetron (ZOFRAN) IV, sodium chloride flush   Vital Signs    Vitals:   12/15/20 0300 12/15/20 0400 12/15/20 0500 12/15/20 0600  BP: (!) 142/69 133/77 129/81 118/66  Pulse:      Resp: 20 16 14 16   Temp:      TempSrc:      SpO2:      Weight:      Height:        Intake/Output Summary (Last 24 hours) at 12/15/2020 0725 Last data filed at 12/14/2020 2100 Gross per 24 hour  Intake 921.24 ml  Output 1050 ml  Net -128.76 ml    Filed Weights   12/11/20 1830 12/11/20 1939  Weight: 90.7 kg 100 kg    Physical Exam    GEN- The patient is well appearing, alert and oriented x 3 today.   Head- normocephalic, atraumatic Eyes-  Sclera clear, conjunctiva pink Ears- hearing intact Oropharynx- clear Neck- supple Lungs- Clear to ausculation bilaterally, normal work of breathing Heart- Regular rate and rhythm, no murmurs, rubs or gallops GI- soft, NT, ND, + BS Extremities- no clubbing or cyanosis. No edema Skin- no rash or lesion Psych- euthymic mood, full affect Neuro- strength  and sensation are intact  Labs    CBC No results for input(s): WBC, NEUTROABS, HGB, HCT, MCV, PLT in the last 72 hours.  Basic Metabolic Panel Recent Labs    12/13/20 0640 12/14/20 1430 12/15/20 0054  NA 136 134* 133*  K 4.3 4.3 4.5  CL 105 101 100  CO2 22 23 26   GLUCOSE 119* 126* 113*  BUN 9 7* 8  CREATININE 0.66 0.65 0.60  CALCIUM 8.8* 9.5 9.3  MG 1.8  --  1.6*    Liver Function Tests No results for input(s): AST, ALT, ALKPHOS, BILITOT, PROT, ALBUMIN in the last 72 hours.  No results for input(s): LIPASE, AMYLASE in the last 72 hours. Cardiac Enzymes No results for input(s): CKTOTAL, CKMB, CKMBINDEX, TROPONINI in the last 72 hours.   Telemetry    NSR 70s mostly, continues to have intermittent monomorphic VT, but quiescent since last night ~ 0100 (personally reviewed)  Radiology    MR CARDIAC MORPHOLOGY W WO CONTRAST  Result Date: 12/13/2020 CLINICAL DATA:  VT EXAM: CARDIAC MRI TECHNIQUE: The patient was scanned on a 1.5 Tesla Siemens magnet. A dedicated cardiac coil was used. Functional imaging was done using Fiesta sequences. 2,3, and 4 chamber views were done to assess for RWMA's. Modified Simpson's rule using a short axis stack was used to  calculate an ejection fraction on a dedicated work Conservation officer, nature. The patient received 10 cc of Gadavist . After 10 minutes inversion recovery sequences were used to assess for infiltration and scar tissue. CONTRAST:  Gadavist FINDINGS: Mild LAE. Normal RA. No ASD/PFO. Normal aortic root 3.2 cm No pericardial effusion. Normal appearing cardiac valves with tri leaflet AV. Quantitative LVEF low normal with abnormal septal motion No defined RWMAls EF 53% (EDV 109 ESV 51 cc SV 57 cc) Normal RV size and function no diverticula or evidence of RV dysplasia RVEF normal 52% (EDV 78 cc ESV 37 cc SV 41 cc ) Delayed inversion recovery images showed non specific mild mid myocardial uptake slightly worse in the basal septum There was  no pattern of uptake To suggest CAD/MI, sarcoid, or infiltrative DCM Native T2 measures are normal at 50 msec suggesting no edema from recent COVID infection IMPRESSION: 1. Low normal LVEF 53% with abnormal septal motion 2. Mild mid myocardial gadolinium uptake on delayed inversion recovery sequences slightly worse in basal septum non specific 3.  Normal RV size and function no evidence of RV dysplasia RVEF 52% 4.  Normal cardiac valves 5.  Normal T2 50 msec suggesting no edema post COVID infection 6.  No pericardial effusion 7.  Normal aortic root 3.2 cm 8.  No ASD/PFO [place] Electronically Signed   By: Jenkins Rouge M.D.   On: 12/13/2020 14:14    Patient Profile     Kelsey Diaz is a 75 y.o. female with a history of non-obstructive CAD, HLD, OSA not on CPAP, HTN, RA, and FM who is being seen today for the evaluation of VT at the request of Dr. Johnsie Cancel.  Assessment & Plan    1.  Ventricular tachycardia Currently quiescent Lidocaine stopped. Amiodarone stopped Metoprolol tartrate 25mg  PO TID. Will hold tonight  Tentatively planning for EP study/VT ablation Monday afternoon.   Risk, benefits, and alternatives to EP study and radiofrequency ablation for VT were also discussed in detail today. These risks include but are not limited to complete heart block, stroke, bleeding, vascular damage, tamponade, perforation, and death. The patient understands these risk and wishes to proceed.  Carto and ICE are requested for the procedure.     2. Non-obstructive CAD [facility] 12/12/20 this admission with no CAD Echo with normal EF.    3. COPD Home meds   4. FM / RA Home meds Enbrel on hold (Takes once weekly)  For questions or updates, please contact [place] Please consult [email or web address] for contact info under Cardiology/STEMI.  Signed, Vickie Epley, MD  12/15/2020, 7:25 AM

## 2020-12-15 NOTE — Progress Notes (Addendum)
RN noted positive Covid result on 12/11/20 in chart and patient stated she had Covid in September and this result was a false positive. ID paged for instructions. ID directed RN to lab for CT value of test. Lab informed this RN that only a provider may request a CT value. RN sent message to Dr. Quentin Ore with EP. Paged Dr. Humphrey Rolls with cardiology, and was told they were aware of the situation and he would inform EP.

## 2020-12-16 ENCOUNTER — Inpatient Hospital Stay (HOSPITAL_COMMUNITY): Payer: Medicare HMO | Admitting: Anesthesiology

## 2020-12-16 ENCOUNTER — Encounter (HOSPITAL_COMMUNITY)
Admission: EM | Disposition: A | Discharge status: Home / Self Care | Payer: Self-pay | Source: Home / Self Care | Attending: Cardiology

## 2020-12-16 DIAGNOSIS — I472 Ventricular tachycardia, unspecified: Secondary | ICD-10-CM | POA: Diagnosis not present

## 2020-12-16 HISTORY — PX: V TACH ABLATION: EP1227

## 2020-12-16 LAB — BASIC METABOLIC PANEL
Anion gap: 11 (ref 5–15)
BUN: 15 mg/dL (ref 8–23)
CO2: 24 mmol/L (ref 22–32)
Calcium: 9 mg/dL (ref 8.9–10.3)
Chloride: 98 mmol/L (ref 98–111)
Creatinine, Ser: 0.88 mg/dL (ref 0.44–1.00)
GFR, Estimated: >60 mL/min (ref >60)
Glucose, Bld: 119 mg/dL — ABNORMAL HIGH (ref 70–99)
Potassium: 4.3 mmol/L (ref 3.5–5.1)
Sodium: 133 mmol/L — ABNORMAL LOW (ref 135–145)

## 2020-12-16 LAB — MAGNESIUM: Magnesium: 1.9 mg/dL (ref 1.7–2.4)

## 2020-12-16 SURGERY — V TACH ABLATION
Anesthesia: Monitor Anesthesia Care

## 2020-12-16 MED ORDER — ISOPROTERENOL HCL 0.2 MG/ML IJ SOLN
INTRAMUSCULAR | Status: AC
  Filled 2020-12-16: qty 5

## 2020-12-16 MED ORDER — AMIODARONE HCL 200 MG PO TABS
200.0000 mg | ORAL_TABLET | Freq: Every day | ORAL | Status: DC
  Administered 2020-12-16 – 2020-12-17 (×2): 200 mg via ORAL
  Filled 2020-12-16 (×2): qty 1

## 2020-12-16 MED ORDER — LACTATED RINGERS IV SOLN: INTRAVENOUS | Status: DC | PRN

## 2020-12-16 MED ORDER — METOPROLOL SUCCINATE ER 50 MG PO TB24
50.0000 mg | ORAL_TABLET | Freq: Two times a day (BID) | ORAL | Status: DC
  Administered 2020-12-17: 50 mg via ORAL
  Filled 2020-12-16: qty 1

## 2020-12-16 MED ORDER — METOPROLOL SUCCINATE ER 25 MG PO TB24: 25.0000 mg | ORAL_TABLET | Freq: Two times a day (BID) | ORAL | Status: DC

## 2020-12-16 MED ORDER — SODIUM CHLORIDE 0.9 % IV SOLN: INTRAVENOUS | Status: DC

## 2020-12-16 MED ORDER — METOPROLOL SUCCINATE ER 50 MG PO TB24
50.0000 mg | ORAL_TABLET | Freq: Two times a day (BID) | ORAL | Status: DC
  Administered 2020-12-16: 50 mg via ORAL
  Filled 2020-12-16: qty 1

## 2020-12-16 MED ORDER — ISOPROTERENOL HCL 0.2 MG/ML IJ SOLN
INTRAVENOUS | Status: DC | PRN
  Administered 2020-12-16: 2 ug/min via INTRAVENOUS

## 2020-12-16 MED ORDER — MAGNESIUM SULFATE 2 GM/50ML IV SOLN
2.0000 g | Freq: Once | INTRAVENOUS | Status: AC
  Administered 2020-12-16: 2 g via INTRAVENOUS
  Filled 2020-12-16: qty 50

## 2020-12-16 SURGICAL SUPPLY — 4 items
PACK EP LATEX FREE (CUSTOM PROCEDURE TRAY) ×2
PACK EP LF (CUSTOM PROCEDURE TRAY) ×1 IMPLANT
PAD PRO RADIOLUCENT 2001M-C (PAD) ×2 IMPLANT
PATCH CARTO3 (PAD) ×1 IMPLANT

## 2020-12-16 NOTE — Anesthesia Preprocedure Evaluation (Addendum)
Anesthesia Evaluation  Patient identified by MRN, date of birth, ID band Patient awake    Reviewed: Allergy & Precautions, NPO status , Patient's Chart, lab work & pertinent test results  Airway Mallampati: II  TM Distance: >3 FB Neck ROM: Full    Dental no notable dental hx. (+) Edentulous Upper, Edentulous Lower   Pulmonary asthma , sleep apnea , pneumonia (S/P Covid 19 Pneumonia 5 weeks ago), former smoker,    Pulmonary exam normal breath sounds clear to auscultation       Cardiovascular hypertension, Pt. on medications Normal cardiovascular exam+ dysrhythmias Ventricular Tachycardia  Rhythm:Regular Rate:Normal  10/210/22 TTE 1. Left ventricular ejection fraction, by estimation, is 60 to 65%. The  left ventricle has normal function. The left ventricle has no regional  wall motion abnormalities. Left ventricular diastolic parameters are  consistent with Grade I diastolic  dysfunction (impaired relaxation).  2. Right ventricular systolic function is normal. The right ventricular  size is normal.  3. Left atrial size was mild to moderately dilated.  4. The mitral valve is normal in structure. No evidence of mitral valve  regurgitation. No evidence of mitral stenosis.  5. The aortic valve is tricuspid. There is mild calcification of the  aortic valve. Aortic valve regurgitation is not visualized. Mild to  moderate aortic valve sclerosis/calcification is present, without any  evidence of aortic stenosis.  6. The inferior vena cava is normal in size with greater than 50%  respiratory variability, suggesting right atrial pressure of 3 mmHg.    Neuro/Psych Depression  Neuromuscular disease    GI/Hepatic GERD  ,  Endo/Other  Hyperthyroidism   Renal/GU Lab Results      Component                Value               Date                      WBC                      6.5                 12/15/2020                HGB                       14.3                12/15/2020                HCT                      42.5                12/15/2020                MCV                      93.0                12/15/2020                PLT                      170                 12/15/2020  Musculoskeletal  (+) Arthritis , Rheumatoid disorders,    Abdominal (+) + obese (BMI 34.53),   Peds  Hematology Lab Results      Component                Value               Date                      WBC                      6.5                 12/15/2020                HGB                      14.3                12/15/2020                HCT                      42.5                12/15/2020                MCV                      93.0                12/15/2020                PLT                      170                 12/15/2020              Anesthesia Other Findings   Reproductive/Obstetrics                           Anesthesia Physical Anesthesia Plan  ASA: 3  Anesthesia Plan: MAC   Post-op Pain Management:    Induction:   PONV Risk Score and Plan: 2 and Treatment may vary due to age or medical condition  Airway Management Planned: Natural Airway and Nasal Cannula  Additional Equipment: None  Intra-op Plan:   Post-operative Plan:   Informed Consent: I have reviewed the patients History and Physical, chart, labs and discussed the procedure including the risks, benefits and alternatives for the proposed anesthesia with the patient or authorized representative who has indicated his/her understanding and acceptance.     Dental advisory given  Plan Discussed with:   Anesthesia Plan Comments:        Anesthesia Quick Evaluation

## 2020-12-16 NOTE — Progress Notes (Addendum)
Progress Note  Patient Name: Kelsey Diaz Date of Encounter: 12/16/2020  [facility] Cardiologist: Minus Breeding, MD   Subjective   Feels well, no complaints  Inpatient Medications    Scheduled Meds:  aspirin EC  81 mg Oral Daily   calcium-vitamin D  1 tablet Oral Q breakfast   Chlorhexidine Gluconate Cloth  6 each Topical Daily   DULoxetine  120 mg Oral Daily   enoxaparin (LOVENOX) injection  40 mg Subcutaneous N02V   folic acid  1 mg Oral Daily   influenza vaccine adjuvanted  0.5 mL Intramuscular Tomorrow-1000   multivitamin with minerals  1 tablet Oral Daily   pantoprazole  40 mg Oral Daily   rosuvastatin  20 mg Oral Weekly   sodium chloride flush  3 mL Intravenous Q12H   Continuous Infusions:  sodium chloride     sodium chloride     sodium chloride Stopped (12/15/20 1015)   sodium chloride     PRN Meds: sodium chloride, sodium chloride, sodium chloride, acetaminophen, albuterol, HYDROcodone-acetaminophen, ondansetron (ZOFRAN) IV, sodium chloride flush   Vital Signs    Vitals:   12/16/20 0500 12/16/20 0600 12/16/20 0700 12/16/20 0800  BP: 120/67 120/77 129/70   Pulse: 81 73 80   Resp: (!) 25 15 (!) 25   Temp:    98 F (36.7 C)  TempSrc:    Oral  SpO2: 91% 97% 94%   Weight:      Height:        Intake/Output Summary (Last 24 hours) at 12/16/2020 0819 Last data filed at 12/16/2020 0000 Gross per 24 hour  Intake 31.32 ml  Output 500 ml  Net -468.68 ml   Last 3 Weights 12/11/2020 12/11/2020 09/30/2020  Weight (lbs) 220 lb 7.4 oz 200 lb 198 lb  Weight (kg) 100 kg 90.719 kg 89.812 kg      Telemetry    SR 60's-80s - Personally Reviewed  ECG    No new EKGs - Personally Reviewed  Physical Exam   GEN: No acute distress.   Neck: No JVD Cardiac: RRR, no murmurs, rubs, or gallops.  Respiratory: CTA b/l. GI: Soft, nontender, non-distended  MS: No edema; No deformity. Neuro:  Nonfocal  Psych: Normal affect   Labs    High Sensitivity  Troponin:   Recent Labs  Lab 12/11/20 1825 12/11/20 2033  TROPONINIHS 15 33*     Chemistry Recent Labs  Lab 12/11/20 2033 12/12/20 0429 12/13/20 0640 12/14/20 1430 12/15/20 0054 12/16/20 0117  NA  --    < > 136 134* 133* 133*  K  --    < > 4.3 4.3 4.5 4.3  CL  --    < > 105 101 100 98  CO2  --    < > 22 23 26 24   GLUCOSE  --    < > 119* 126* 113* 119*  BUN  --    < > 9 7* 8 15  CREATININE  --    < > 0.66 0.65 0.60 0.88  CALCIUM  --    < > 8.8* 9.5 9.3 9.0  MG 1.7   < > 1.8  --  1.6* 1.9  PROT 5.6*  --   --   --   --   --   ALBUMIN 3.2*  --   --   --   --   --   AST 16  --   --   --   --   --  ALT 12  --   --   --   --   --   ALKPHOS 43  --   --   --   --   --   BILITOT 1.0  --   --   --   --   --   GFRNONAA  --    < > >60 >60 >60 >60  ANIONGAP  --    < > 9 10 7 11    < > = values in this interval not displayed.    Lipids  Recent Labs  Lab 12/11/20 2132  CHOL 113  TRIG 127  HDL 49  LDLCALC 39  CHOLHDL 2.3    Hematology Recent Labs  Lab 12/11/20 1825 12/12/20 0429 12/15/20 1916  WBC 5.2 5.7 6.5  RBC 4.64 4.12 4.57  HGB 14.5 12.8 14.3  HCT 44.0 38.5 42.5  MCV 94.8 93.4 93.0  MCH 31.3 31.1 31.3  MCHC 33.0 33.2 33.6  RDW 12.2 12.2 12.1  PLT 150 147* 170   Thyroid  Recent Labs  Lab 12/11/20 2132 12/12/20 1559  TSH 0.054*  --   FREET4  --  1.17*    BNPNo results for input(s): BNP, PROBNP in the last 168 hours.  DDimer No results for input(s): DDIMER in the last 168 hours.   Radiology    No results found.  Cardiac Studies   12/12/20; [facility] IMPRESSION: Ms. Epperly has essentially normal coronary arteries and normal filling pressures.  Her ventricular tachycardia is not ischemically mediated.  It may be related to electrolyte abnormalities.  She was hypomagnesemic and hypokalemic and has been repleted.  Further evaluation per the EP service.  Dr. Lars Mage has been notified.  The sheath was removed and a TR band was placed on the right wrist to  achieve patent hemostasis.  The patient left lab in stable condition  12/12/20; TTE IMPRESSIONS   1. Left ventricular ejection fraction, by estimation, is 60 to 65%. The  left ventricle has normal function. The left ventricle has no regional  wall motion abnormalities. Left ventricular diastolic parameters are  consistent with Grade I diastolic  dysfunction (impaired relaxation).   2. Right ventricular systolic function is normal. The right ventricular  size is normal.   3. Left atrial size was mild to moderately dilated.   4. The mitral valve is normal in structure. No evidence of mitral valve  regurgitation. No evidence of mitral stenosis.   5. The aortic valve is tricuspid. There is mild calcification of the  aortic valve. Aortic valve regurgitation is not visualized. Mild to  moderate aortic valve sclerosis/calcification is present, without any  evidence of aortic stenosis.   6. The inferior vena cava is normal in size with greater than 50%  respiratory variability, suggesting right atrial pressure of 3 mmHg.   Patient Profile     75 y.o. female w/PMHx RA, fibromyalgia, non-obstructive CAD, OSA (untreated), HTN admitted with near syncope and VT  Assessment & Plan    Near syncope VT Preserved LVEF No obstructive "essentially normal coronaries and filling pressures" Suspect outflow tract  Off lidocaine since Friday Off amio since Sat Metoprolol held (last dose Sat AM)  Planned for EPS possible ablation today with Dr. Quentin Ore Dr. Quentin Ore has seen and examined the patient this AM She has no follow up questions, remains agreeable to proceed  3. COVID + Had illness last month CT value is reported as 40.6, in d/w ID APP, this is in alignment with post  illness value, no isolation or particular intervention needed   For questions or updates, please contact [place] Please consult [email or web address] for contact info under        Signed, Baldwin Jamaica, PA-C   12/16/2020, 8:19 AM

## 2020-12-16 NOTE — Transfer of Care (Signed)
Immediate Anesthesia Transfer of Care Note  Patient: Kelsey Diaz  Procedure(s) Performed: Stephanie Coup ABLATION  Patient Location: ICU  Anesthesia Type:MAC/no sedation administered  Level of Consciousness: awake, alert , oriented and patient cooperative  Airway & Oxygen Therapy: Patient Spontanous Breathing  Post-op Assessment: Report given to RN and Post -op Vital signs reviewed and stable  Post vital signs: Reviewed and stable  Last Vitals:  Vitals Value Taken Time  BP    Temp    Pulse    Resp    SpO2      Last Pain:  Vitals:   12/16/20 1200  TempSrc:   PainSc: 3       Patients Stated Pain Goal: 0 (93/71/69 6789)  Complications: No notable events documented.

## 2020-12-16 NOTE — Anesthesia Postprocedure Evaluation (Signed)
Anesthesia Post Note  Patient: Kelsey Diaz  Procedure(s) Performed: Stephanie Coup ABLATION     Patient location during evaluation: SICU Anesthesia Type: MAC Level of consciousness: awake and alert, patient cooperative and oriented Pain management: pain level controlled Vital Signs Assessment: post-procedure vital signs reviewed and stable Respiratory status: spontaneous breathing, nonlabored ventilation and respiratory function stable Cardiovascular status: blood pressure returned to baseline Postop Assessment: no apparent nausea or vomiting and adequate PO intake Anesthetic complications: no   No notable events documented.  Last Vitals:  Vitals:   12/16/20 1200 12/16/20 1300  BP: 123/76 100/88  Pulse: 71 85  Resp: (!) 23 (!) 23  Temp:    SpO2: 93%     Last Pain:  Vitals:   12/16/20 1200  TempSrc:   PainSc: 3                  Ariadna Setter,E. Brittan Mapel

## 2020-12-16 NOTE — Anesthesia Procedure Notes (Signed)
Procedure Name: MAC Date/Time: 12/16/2020 1:57 PM Performed by: Kyung Rudd, CRNA Pre-anesthesia Checklist: Patient identified, Emergency Drugs available, Suction available and Patient being monitored Patient Re-evaluated:Patient Re-evaluated prior to induction Oxygen Delivery Method: Nasal cannula

## 2020-12-17 ENCOUNTER — Encounter (HOSPITAL_COMMUNITY): Payer: Self-pay | Admitting: Cardiology

## 2020-12-17 DIAGNOSIS — M797 Fibromyalgia: Secondary | ICD-10-CM

## 2020-12-17 DIAGNOSIS — Z789 Other specified health status: Secondary | ICD-10-CM

## 2020-12-17 DIAGNOSIS — G4733 Obstructive sleep apnea (adult) (pediatric): Secondary | ICD-10-CM

## 2020-12-17 DIAGNOSIS — I10 Essential (primary) hypertension: Secondary | ICD-10-CM

## 2020-12-17 DIAGNOSIS — J449 Chronic obstructive pulmonary disease, unspecified: Secondary | ICD-10-CM

## 2020-12-17 DIAGNOSIS — I472 Ventricular tachycardia, unspecified: Principal | ICD-10-CM

## 2020-12-17 DIAGNOSIS — M069 Rheumatoid arthritis, unspecified: Secondary | ICD-10-CM

## 2020-12-17 LAB — BASIC METABOLIC PANEL
Anion gap: 10 (ref 5–15)
BUN: 18 mg/dL (ref 8–23)
CO2: 21 mmol/L — ABNORMAL LOW (ref 22–32)
Calcium: 9 mg/dL (ref 8.9–10.3)
Chloride: 101 mmol/L (ref 98–111)
Creatinine, Ser: 0.72 mg/dL (ref 0.44–1.00)
GFR, Estimated: >60 mL/min (ref >60)
Glucose, Bld: 113 mg/dL — ABNORMAL HIGH (ref 70–99)
Potassium: 4.9 mmol/L (ref 3.5–5.1)
Sodium: 132 mmol/L — ABNORMAL LOW (ref 135–145)

## 2020-12-17 MED ORDER — CALCIUM CARBONATE ANTACID 500 MG PO CHEW
1.0000 | CHEWABLE_TABLET | Freq: Once | ORAL | Status: AC
  Administered 2020-12-17: 200 mg via ORAL
  Filled 2020-12-17: qty 1

## 2020-12-17 MED ORDER — METOPROLOL SUCCINATE ER 50 MG PO TB24: 50.0000 mg | ORAL_TABLET | Freq: Two times a day (BID) | ORAL | 6 refills | Status: DC

## 2020-12-17 MED ORDER — AMIODARONE HCL 200 MG PO TABS: 200.0000 mg | ORAL_TABLET | Freq: Every day | ORAL | 5 refills | Status: DC

## 2020-12-17 NOTE — Progress Notes (Signed)
Discussed d/c instructions with pt and daughter, IV lines removed and pressure held. Reviewed new meds and where to pick up. No concerns noted.

## 2020-12-17 NOTE — Discharge Summary (Signed)
DISCHARGE SUMMARY    Patient ID: Kelsey Diaz,  MRN: [id number], DOB/AGE: Jun 28, 1945 75 y.o.  Admit date: 12/11/2020 Discharge date: 12/17/2020  Primary Care Physician: Harlan Stains, MD Primary Cardiologist: Dr. Percival Spanish Electrophysiologist: new, Dr. Quentin Ore  Primary Discharge Diagnosis:  Near syncope VT  Secondary Discharge Diagnosis:  COPD OSA Intolerant of CPAP HTN RA fibromyalgia  Allergies  Allergen Reactions   Morphine Other (See Comments)    Stopped heart   Advair Diskus [Fluticasone-Salmeterol]     Sensitivity to smells   Ambien [Zolpidem Tartrate]     irritable   Amlodipine Besylate     Feels bad   Bextra [Valdecoxib]     Stomach issues   Captopril     Ache   Latex Itching    Bandaids only   Lisinopril     cough   Pulmicort [Budesonide]     Worse breathing, sensitivity to smells   Savella [Milnacipran Hcl]     Mental fog   Spiriva [Tiotropium Bromide Monohydrate]     Dry mouth     Procedures This Admission:  1.   12/12/20; LHC, Dr. Gwenlyn Found IMPRESSION: Kelsey Diaz has essentially normal coronary arteries and normal filling pressures.  Her ventricular tachycardia is not ischemically mediated.  It may be related to electrolyte abnormalities.  She was hypomagnesemic and hypokalemic and has been repleted.  Further evaluation per the EP service.  Dr. Lars Mage has been notified.  The sheath was removed and a TR band was placed on the right wrist to achieve patent hemostasis.  The patient left lab in stable condition    Brief HPI: Kelsey Diaz is a 75 y.o. female with PMHx including above admitted 12/11/20 with acute inset weak spells and near syncope, EMS called and found her in MMVT that converted to [place] with IV amiodarone bolus.  She was admitted for work up and management  Hospital Course:  The patient was admitted to ICU labs largely unremarkable with mild abn HS Trop 15 and 33.  She was started on amiodarone gtt and lidocaine  gtt (briefly).  LHC noted essentially normal coronaries, TTE with preserved LVEF , no significnat VHD.  EP was called to the case.  C.MRI noted nonspecific findings. VT suspect to be outflow tract.   Amiodarone and BB held in anticipation of EPS.  She underwent EPS though was not inducible, resumed on amiodarone PO and betablocker.  No indication at this time for ICD.  Dr. Quentin Ore discussed at length with her. Also discussed no driving 110mo by [place] law  She had no recurrent VT She feels well, and ready for home today, she has ambulated in the room to the BR without difficulty she was examined by Dr. Quentin Ore and considered stable for discharge to home.    Physical Exam: Vitals:   12/17/20 0825 12/17/20 0830 12/17/20 0900 12/17/20 1000  BP:   96/80 117/62  Pulse: 63 63 68 62  Resp: (!) $RemoveB'21 14 15 19  'kSnMQChY$ Temp:      TempSrc:      SpO2: 92% 92% 94% 94%  Weight:      Height:        GEN- The patient is well appearing, alert and oriented x 3 today.   HEENT: normocephalic, atraumatic; sclera clear, conjunctiva pink; hearing intact; oropharynx clear Lungs- CTA b/l, normal work of breathing.  No wheezes, rales, rhonchi Heart- RRR, no murmurs, rubs or gallops, PMI not laterally displaced GI- soft, non-tender, non-distended Extremities-  no clubbing, cyanosis, or edema MS- no significant deformity or atrophy Skin- warm and dry, no rash or lesion Psych- euthymic mood, full affect Neuro- no gross defecits  Labs:   Lab Results  Component Value Date   WBC 6.5 12/15/2020   HGB 14.3 12/15/2020   HCT 42.5 12/15/2020   MCV 93.0 12/15/2020   PLT 170 12/15/2020    Recent Labs  Lab 12/11/20 2033 12/12/20 0429 12/17/20 0126  NA  --    < > 132*  K  --    < > 4.9  CL  --    < > 101  CO2  --    < > 21*  BUN  --    < > 18  CREATININE  --    < > 0.72  CALCIUM  --    < > 9.0  PROT 5.6*  --   --   BILITOT 1.0  --   --   ALKPHOS 43  --   --   ALT 12  --   --   AST 16  --   --   GLUCOSE  --     < > 113*   < > = values in this interval not displayed.    Discharge Medications:  Allergies as of 12/17/2020       Reactions   Morphine Other (See Comments)   Stopped heart   Advair Diskus [fluticasone-salmeterol]    Sensitivity to smells   Ambien [zolpidem Tartrate]    irritable   Amlodipine Besylate    Feels bad   Bextra [valdecoxib]    Stomach issues   Captopril    Ache   Latex Itching   Bandaids only   Lisinopril    cough   Pulmicort [budesonide]    Worse breathing, sensitivity to smells   Savella [milnacipran Hcl]    Mental fog   Spiriva [tiotropium Bromide Monohydrate]    Dry mouth        Medication List     STOP taking these medications    metoprolol tartrate 25 MG tablet Commonly known as: LOPRESSOR   valsartan-hydrochlorothiazide 320-12.5 MG tablet Commonly known as: DIOVAN-HCT       TAKE these medications    Accu-Chek Guide Me w/Device Kit   Accu-Chek Guide test strip Generic drug: glucose blood   Accu-Chek Softclix Lancets lancets   albuterol 108 (90 Base) MCG/ACT inhaler Commonly known as: VENTOLIN HFA Inhale 2 puffs into the lungs every 6 (six) hours as needed for shortness of breath.   amiodarone 200 MG tablet Commonly known as: PACERONE Take 1 tablet (200 mg total) by mouth daily. Start taking on: December 18, 2020   Belsomra 10 MG Tabs Generic drug: Suvorexant Take 1 tablet by mouth at bedtime as needed.   Calcium Carbonate-Vitamin D 600-200 MG-UNIT Tabs Take 1 tablet by mouth daily.   Clobetasol Prop Emollient Base 0.05 % emollient cream Apply 1 application topically 2 (two) times daily.   DULoxetine 60 MG capsule Commonly known as: CYMBALTA Take 120 mg by mouth daily.   Enbrel 50 MG/ML injection Generic drug: etanercept Inject 50 mg into the skin once a week.   FISH OIL PO Take 1,000 mg by mouth daily.   fluocinonide 0.05 % external solution Commonly known as: LIDEX Apply 1 application topically 2 (two) times  daily as needed (skin irritation).   folic acid 1 MG tablet Commonly known as: FOLVITE Take 1 tablet by mouth daily.   HYDROcodone-acetaminophen 7.5-325 MG  tablet Commonly known as: NORCO Take 1 tablet by mouth every 6 (six) hours as needed for moderate pain or severe pain.   HYDROcodone-acetaminophen 7.5-325 MG tablet Commonly known as: NORCO Take 1 tablet by mouth every 6 (six) hours as needed for moderate pain or severe pain. Start taking on: December 26, 2020   LORazepam 0.5 MG tablet Commonly known as: ATIVAN Take 0.5 mg by mouth daily as needed for anxiety.   meloxicam 15 MG tablet Commonly known as: MOBIC Take 15 mg by mouth daily.   methotrexate 2.5 MG tablet Take 20 mg by mouth once a week.   metoprolol succinate 50 MG 24 hr tablet Commonly known as: TOPROL-XL Take 1 tablet (50 mg total) by mouth 2 (two) times daily. Take with or immediately following a meal.   MULTI-VITAMIN PO Take 1 tablet by mouth daily.   omeprazole 40 MG capsule Commonly known as: PRILOSEC Take 1 capsule by mouth daily.   Osphena 60 MG Tabs Generic drug: Ospemifene Take 1 tablet by mouth daily.   rosuvastatin 20 MG tablet Commonly known as: CRESTOR Take 20 mg by mouth once a week.        Disposition: Home Discharge Instructions     Diet - low sodium heart healthy   Complete by: As directed    Increase activity slowly   Complete by: As directed        Follow-up Information     Baldwin Jamaica, PA-C Follow up.   Specialty: Cardiology Why: 01/20/21 @ 11:20AM Contact information: [address] [place] [address] [phone]                 Duration of Discharge Encounter: Greater than 30 minutes including physician time.  Venetia Night, PA-C 12/17/2020 11:14 AM

## 2020-12-17 NOTE — Discharge Instructions (Signed)
Post procedure care instructions No lifting over 5 lbs for 1 week. No vigorous or sexual activity for 1 week. You may return to work/your usual activities on 12/24/20. Keep procedure site clean & dry. If you notice increased pain, swelling, bleeding or pus, call/return!  You may shower after 24 hours, but no soaking in baths/hot tubs/pools for 1 week.    NO DRIVING 6 MONTHS

## 2020-12-20 ENCOUNTER — Telehealth: Payer: Self-pay | Admitting: Cardiology

## 2020-12-20 NOTE — Telephone Encounter (Signed)
STAT if HR is under 50 or over 120 (normal HR is 60-100 beats per minute)  What is your heart rate? 49  Do you have a log of your heart rate readings (document readings)? 49, 57, 54  Do you have any other symptoms? Some lightheadness

## 2020-12-20 NOTE — Telephone Encounter (Addendum)
Spoke to patient she stated her pulse has been low today ranging 49,57,54.  B/P 101/62,124/62,124/66,117/66.She has been light headed and dizzy.Stated she was recently discharged from [facility] and she is taking Amiodarone 200 mg daily,Metoprolol 50 mg twice a day.Advise I will send message to Tommye Standard PA for advice.

## 2020-12-23 ENCOUNTER — Ambulatory Visit: Payer: Medicare HMO | Admitting: Anesthesiology

## 2020-12-23 ENCOUNTER — Ambulatory Visit: Payer: Medicare HMO | Attending: Anesthesiology | Admitting: Anesthesiology

## 2020-12-23 ENCOUNTER — Other Ambulatory Visit: Payer: Self-pay

## 2020-12-23 ENCOUNTER — Encounter: Payer: Self-pay | Admitting: Anesthesiology

## 2020-12-23 DIAGNOSIS — M5136 Other intervertebral disc degeneration, lumbar region: Secondary | ICD-10-CM

## 2020-12-23 DIAGNOSIS — M79642 Pain in left hand: Secondary | ICD-10-CM

## 2020-12-23 DIAGNOSIS — M25551 Pain in right hip: Secondary | ICD-10-CM

## 2020-12-23 DIAGNOSIS — M4716 Other spondylosis with myelopathy, lumbar region: Secondary | ICD-10-CM

## 2020-12-23 DIAGNOSIS — M5431 Sciatica, right side: Secondary | ICD-10-CM

## 2020-12-23 DIAGNOSIS — M0579 Rheumatoid arthritis with rheumatoid factor of multiple sites without organ or systems involvement: Secondary | ICD-10-CM

## 2020-12-23 DIAGNOSIS — G894 Chronic pain syndrome: Secondary | ICD-10-CM

## 2020-12-23 DIAGNOSIS — F1190 Opioid use, unspecified, uncomplicated: Secondary | ICD-10-CM

## 2020-12-23 DIAGNOSIS — M25552 Pain in left hip: Secondary | ICD-10-CM

## 2020-12-23 DIAGNOSIS — M797 Fibromyalgia: Secondary | ICD-10-CM

## 2020-12-23 DIAGNOSIS — M79641 Pain in right hand: Secondary | ICD-10-CM

## 2020-12-23 DIAGNOSIS — M5432 Sciatica, left side: Secondary | ICD-10-CM

## 2020-12-23 MED ORDER — HYDROCODONE-ACETAMINOPHEN 7.5-325 MG PO TABS: 1.0000 | ORAL_TABLET | Freq: Four times a day (QID) | ORAL | 0 refills | Status: AC | PRN

## 2020-12-23 NOTE — Progress Notes (Signed)
Virtual Visit via Telephone Note  I connected with Kelsey Diaz on 12/23/20 at  1:00 PM EDT by telephone and verified that I am speaking with the correct person using two identifiers.  Location: Patient: Home Provider: Pain control center    I discussed the limitations, risks, security and privacy concerns of performing an evaluation and management service by telephone and the availability of in person appointments. I also discussed with the patient that there may be a patient responsible charge related to this service. The patient expressed understanding and agreed to proceed.   History of Present Illness: I spoke to Kelsey Diaz via telephone today as we are unable link for the video portion of virtual conference and she states that her back pain has been challenging this past several weeks as she was recently admitted to the ICU secondary to nonsustained V. tach.  She is now on amiodarone and recovering from that event.  Because of the bed rest her back has been more problematic.  She still taking her medications without difficulty.  She is doing better overall with the amiodarone and trying to stay active.  She is sleeping reasonably well at night.  No side effects are noted with the opioid medications.  She still having a lot of pain in the hips hands feet and wrists with her rheumatoid arthritis.  She is questioning whether she might present for repeat epidural for her low back pain as she generally gets these periodically and they were quite well for her giving her 75 to 100% relief for 2 to 3 months.  Otherwise she is in her usual state of health.  Review of systems: General: No fevers or chills Pulmonary: No shortness of breath or dyspnea Cardiac: No angina or palpitations or lightheadedness GI: No abdominal pain or constipation Psych: No depression    Observations/Objective:  Current Outpatient Medications:    ACCU-CHEK GUIDE test strip, , Disp: , Rfl:    Accu-Chek Softclix Lancets  lancets, , Disp: , Rfl:    albuterol (VENTOLIN HFA) 108 (90 Base) MCG/ACT inhaler, Inhale 2 puffs into the lungs every 6 (six) hours as needed for shortness of breath. , Disp: , Rfl:    amiodarone (PACERONE) 200 MG tablet, Take 1 tablet (200 mg total) by mouth daily., Disp: 30 tablet, Rfl: 5   Blood Glucose Monitoring Suppl (ACCU-CHEK GUIDE ME) w/Device KIT, , Disp: , Rfl:    Calcium Carbonate-Vitamin D 600-200 MG-UNIT TABS, Take 1 tablet by mouth daily. , Disp: , Rfl:    Clobetasol Prop Emollient Base 0.05 % emollient cream, Apply 1 application topically 2 (two) times daily., Disp: , Rfl:    DULoxetine (CYMBALTA) 60 MG capsule, Take 120 mg by mouth daily. , Disp: , Rfl:    etanercept (ENBREL) 50 MG/ML injection, Inject 50 mg into the skin once a week., Disp: , Rfl:    fluocinonide (LIDEX) 0.05 % external solution, Apply 1 application topically 2 (two) times daily as needed (skin irritation). , Disp: , Rfl:    folic acid (FOLVITE) 1 MG tablet, Take 1 tablet by mouth daily., Disp: , Rfl:    [START ON 12/26/2020] HYDROcodone-acetaminophen (NORCO) 7.5-325 MG tablet, Take 1 tablet by mouth every 6 (six) hours as needed for moderate pain or severe pain., Disp: 120 tablet, Rfl: 0   [START ON 01/25/2021] HYDROcodone-acetaminophen (NORCO) 7.5-325 MG tablet, Take 1 tablet by mouth every 6 (six) hours as needed for moderate pain or severe pain., Disp: 120 tablet, Rfl: 0  LORazepam (ATIVAN) 0.5 MG tablet, Take 0.5 mg by mouth daily as needed for anxiety., Disp: , Rfl:    meloxicam (MOBIC) 15 MG tablet, Take 15 mg by mouth daily., Disp: , Rfl:    methotrexate 2.5 MG tablet, Take 20 mg by mouth once a week., Disp: , Rfl:    metoprolol succinate (TOPROL-XL) 50 MG 24 hr tablet, Take 1 tablet (50 mg total) by mouth 2 (two) times daily. Take with or immediately following a meal., Disp: 60 tablet, Rfl: 6   Multiple Vitamin (MULTI-VITAMIN PO), Take 1 tablet by mouth daily. , Disp: , Rfl:    Omega-3 Fatty Acids (FISH  OIL PO), Take 1,000 mg by mouth daily. , Disp: , Rfl:    omeprazole (PRILOSEC) 40 MG capsule, Take 1 capsule by mouth daily., Disp: , Rfl: 1   OSPHENA 60 MG TABS, Take 1 tablet by mouth daily., Disp: , Rfl:    rosuvastatin (CRESTOR) 20 MG tablet, Take 20 mg by mouth once a week., Disp: , Rfl:    Suvorexant (BELSOMRA) 10 MG TABS, Take 1 tablet by mouth at bedtime as needed., Disp: , Rfl:    Assessment and Plan: 1. DDD (degenerative disc disease), lumbar   2. Bilateral sciatica   3. Bilateral hip pain   4. Lumbar spondylosis with myelopathy   5. Chronic, continuous use of opioids   6. Fibromyalgia   7. Chronic pain syndrome   8. Rheumatoid arthritis involving multiple sites with positive rheumatoid factor ([place])   9. Pain in both hands   Based on our discussion and after review of the [facility] practitioner database information and in consideration of her recent admission I am going to refill her medications for the next 2 months dated from November 3 and December 3.  We will schedule her for a repeat epidural in approximately 2 months.  If she has any changes in the meantime she is instructed to contact us.  No other changes in her pain management protocol will be initiated at this time.  I want her to continue follow-up with her cardiology doctors and medicine physicians as well.  Return to clinic as mentioned.  Follow Up Instructions:    I discussed the assessment and treatment plan with the patient. The patient was provided an opportunity to ask questions and all were answered. The patient agreed with the plan and demonstrated an understanding of the instructions.   The patient was advised to call back or seek an in-person evaluation if the symptoms worsen or if the condition fails to improve as anticipated.  I provided 30 minutes of non-face-to-face time during this encounter.   Molli Barrows, MD

## 2020-12-24 NOTE — Telephone Encounter (Signed)
Spoke to patient's daughter she stated mother is feeling better.Pulse ranging 54 to 55.Stated appointment was moved up with Tommye Standard to 01/06/21 at 3:35 pm.

## 2020-12-27 DIAGNOSIS — Z79899 Other long term (current) drug therapy: Secondary | ICD-10-CM | POA: Diagnosis not present

## 2020-12-27 DIAGNOSIS — M069 Rheumatoid arthritis, unspecified: Secondary | ICD-10-CM | POA: Diagnosis not present

## 2020-12-27 DIAGNOSIS — J449 Chronic obstructive pulmonary disease, unspecified: Secondary | ICD-10-CM | POA: Diagnosis not present

## 2020-12-27 DIAGNOSIS — M25569 Pain in unspecified knee: Secondary | ICD-10-CM | POA: Diagnosis not present

## 2020-12-27 DIAGNOSIS — I10 Essential (primary) hypertension: Secondary | ICD-10-CM | POA: Diagnosis not present

## 2020-12-27 DIAGNOSIS — M1990 Unspecified osteoarthritis, unspecified site: Secondary | ICD-10-CM | POA: Diagnosis not present

## 2020-12-27 DIAGNOSIS — R5383 Other fatigue: Secondary | ICD-10-CM | POA: Diagnosis not present

## 2021-01-04 NOTE — Progress Notes (Signed)
Cardiology Office Note Date:  01/04/2021  Patient ID:  Kelsey Diaz, Kelsey Diaz 07/04/1945, MRN [id number] PCP:  Harlan Stains, MD  Cardiologist:  Dr. Percival Spanish Electrophysiologist: Dr. Quentin Ore    Chief Complaint:  post hospital  History of Present Illness: Kelsey Diaz is a 75 y.o. female with history of RA, fibromyalgia, non-obstructive CAD, OSA (untreated), HTN, VT  She was admitted to [facility] 12/11/20 with recurrent near syncope EMS was called, found in VT that broke with amio bolus > gtt and lidocaine as well with recurrent episodes. EP consulted, suspect outflow tract VT, amio/lidocaine eventually stopped and planned for EPS Study was unable to induce VT, she was discharged 12/17/20 on amiodarone and metoprolol. No indication for ICD with structurally normal heat/and automatic focus of VT.  Phone notes with pt reports of some lightheadedness and low HRs, in RN follow up, feeling better with HRs 50's, BPs wnl. Post hospital follow up apt was moved up  She is doing fairly well No near syncope or syncope, once she has a slight awareness of something in her chest though was fleeting and only a vague "something", none again No CP, no overt palpitations SHe has some degree of chronic DOE that is unchanged No rest SOB She has a rash R groin she wonders if it could be a reaction the the medicines, she has not noted rash anywhere else.  BP has been OK, sometimes her HR can dip to the 49 range, though usually is 50's60's range  Past Medical History:  Diagnosis Date   Allergic rhinitis    Anxiety    Asthma    Bilateral hip pain 11/28/2018   Colitis    COPD (chronic obstructive pulmonary disease) (HCC)    Fibromyalgia 05/16/2019   GERD (gastroesophageal reflux disease)    With esophageal strictures   Hypertension    Hyperthyroidism    OSA (obstructive sleep apnea) 02/09/2012   No mask.  Did not tolerate   Osteoarthritis    Rheumatoid arthritis ([place]) 10/03/2018    Past  Surgical History:  Procedure Laterality Date   BREAST BIOPSY     x2   CATARACT EXTRACTION W/PHACO Left 09/27/2018   Procedure: CATARACT EXTRACTION PHACO AND INTRAOCULAR LENS PLACEMENT ([place]) LEFT;  Surgeon: Birder Robson, MD;  Location: [place];  Service: Ophthalmology;  Laterality: Left;   CATARACT EXTRACTION W/PHACO Right 11/29/2018   Procedure: CATARACT EXTRACTION PHACO AND INTRAOCULAR LENS PLACEMENT ([place]) RIGHT;  Surgeon: Birder Robson, MD;  Location: [place];  Service: Ophthalmology;  Laterality: Right;  1:22 20.1% 16.64   CHOLECYSTECTOMY     2005   KNEE SURGERY Right 2013   two torn ligaments repaired.    LEFT HEART CATH AND CORONARY ANGIOGRAPHY N/A 06/08/2017   Procedure: LEFT HEART CATH AND CORONARY ANGIOGRAPHY;  Surgeon: Troy Sine, MD;  Location: [place] [facility];  Service: Cardiovascular;  Laterality: N/A;   LEFT HEART CATH AND CORONARY ANGIOGRAPHY N/A 12/12/2020   Procedure: LEFT HEART CATH AND CORONARY ANGIOGRAPHY;  Surgeon: Lorretta Harp, MD;  Location: [place];  Service: Cardiovascular;  Laterality: N/A;   NASAL SINUS SURGERY     V TACH ABLATION N/A 12/16/2020   Procedure: V TACH ABLATION;  Surgeon: Vickie Epley, MD;  Location: [facility];  Service: Cardiovascular;  Laterality: N/A;   VAGINAL HYSTERECTOMY      Current Outpatient Medications  Medication Sig Dispense Refill   ACCU-CHEK GUIDE test strip  Accu-Chek Softclix Lancets lancets      albuterol (VENTOLIN HFA) 108 (90 Base) MCG/ACT inhaler Inhale 2 puffs into the lungs every 6 (six) hours as needed for shortness of breath.      amiodarone (PACERONE) 200 MG tablet Take 1 tablet (200 mg total) by mouth daily. 30 tablet 5   Blood Glucose Monitoring Suppl (ACCU-CHEK GUIDE ME) w/Device KIT      Calcium Carbonate-Vitamin D 600-200 MG-UNIT TABS Take 1 tablet by mouth daily.      Clobetasol Prop Emollient Base 0.05 % emollient cream Apply 1 application  topically 2 (two) times daily.     DULoxetine (CYMBALTA) 60 MG capsule Take 120 mg by mouth daily.      etanercept (ENBREL) 50 MG/ML injection Inject 50 mg into the skin once a week.     fluocinonide (LIDEX) 0.05 % external solution Apply 1 application topically 2 (two) times daily as needed (skin irritation).      folic acid (FOLVITE) 1 MG tablet Take 1 tablet by mouth daily.     HYDROcodone-acetaminophen (NORCO) 7.5-325 MG tablet Take 1 tablet by mouth every 6 (six) hours as needed for moderate pain or severe pain. 120 tablet 0   [START ON 01/25/2021] HYDROcodone-acetaminophen (NORCO) 7.5-325 MG tablet Take 1 tablet by mouth every 6 (six) hours as needed for moderate pain or severe pain. 120 tablet 0   LORazepam (ATIVAN) 0.5 MG tablet Take 0.5 mg by mouth daily as needed for anxiety.     meloxicam (MOBIC) 15 MG tablet Take 15 mg by mouth daily.     methotrexate 2.5 MG tablet Take 20 mg by mouth once a week.     metoprolol succinate (TOPROL-XL) 50 MG 24 hr tablet Take 1 tablet (50 mg total) by mouth 2 (two) times daily. Take with or immediately following a meal. 60 tablet 6   Multiple Vitamin (MULTI-VITAMIN PO) Take 1 tablet by mouth daily.      Omega-3 Fatty Acids (FISH OIL PO) Take 1,000 mg by mouth daily.      omeprazole (PRILOSEC) 40 MG capsule Take 1 capsule by mouth daily.  1   OSPHENA 60 MG TABS Take 1 tablet by mouth daily.     rosuvastatin (CRESTOR) 20 MG tablet Take 20 mg by mouth once a week.     Suvorexant (BELSOMRA) 10 MG TABS Take 1 tablet by mouth at bedtime as needed.     No current facility-administered medications for this visit.    Allergies:   Morphine, Advair diskus [fluticasone-salmeterol], Ambien [zolpidem tartrate], Amlodipine besylate, Bextra [valdecoxib], Captopril, Latex, Lisinopril, Pulmicort [budesonide], Savella [milnacipran hcl], and Spiriva [tiotropium bromide monohydrate]   Social History:  The patient  reports that she quit smoking about 21 years ago. Her  smoking use included cigarettes. She has a 80.00 pack-year smoking history. She has never used smokeless tobacco. She reports that she does not currently use alcohol. She reports that she does not use drugs.   Family History:  The patient's family history includes Allergies in her mother; Cancer in her father; Diabetes in her mother; Emphysema in her father; Heart attack (age of onset: 40) in her father; Heart attack (age of onset: 48) in her brother; Heart disease in her father; Hypertension in her mother.  ROS:  Please see the history of present illness.    All other systems are reviewed and otherwise negative.   PHYSICAL EXAM:  VS:  There were no vitals taken for this visit. BMI: There is no  height or weight on file to calculate BMI. Well nourished, well developed, in no acute distress HEENT: normocephalic, atraumatic Neck: no JVD, carotid bruits or masses Cardiac:  RRR; no significant murmurs, no rubs, or gallops Lungs:  CTA b/l, no wheezing, rhonchi or rales Abd: soft, nontender MS: no deformity or atrophy Ext: no edema Skin: warm and dry, no rash Neuro:  No gross deficits appreciated Psych: euthymic mood, full affect  R groin: erythematous macular rash limited to R groin, not appreciated torso (she reports extends to her inner thigh as well)   EKG:  not done today  12/16/20: EPS CONCLUSIONS: 1.  No inducible ventricular tachycardia with isoproterenol infusion  2. No early apparent complications. 3.  Continue metoprolol and amiodarone   12/12/20; LHC IMPRESSION: Ms. Wyeth has essentially normal coronary arteries and normal filling pressures.  Her ventricular tachycardia is not ischemically mediated.  It may be related to electrolyte abnormalities.  She was hypomagnesemic and hypokalemic and has been repleted.  Further evaluation per the EP service.  Dr. Steffanie Dunn has been notified.  The sheath was removed and a TR band was placed on the right wrist to achieve patent  hemostasis.  The patient left lab in stable condition   12/12/20; TTE IMPRESSIONS   1. Left ventricular ejection fraction, by estimation, is 60 to 65%. The  left ventricle has normal function. The left ventricle has no regional  wall motion abnormalities. Left ventricular diastolic parameters are  consistent with Grade I diastolic  dysfunction (impaired relaxation).   2. Right ventricular systolic function is normal. The right ventricular  size is normal.   3. Left atrial size was mild to moderately dilated.   4. The mitral valve is normal in structure. No evidence of mitral valve  regurgitation. No evidence of mitral stenosis.   5. The aortic valve is tricuspid. There is mild calcification of the  aortic valve. Aortic valve regurgitation is not visualized. Mild to  moderate aortic valve sclerosis/calcification is present, without any  evidence of aortic stenosis.   6. The inferior vena cava is normal in size with greater than 50%  respiratory variability, suggesting right atrial pressure of 3 mmHg.    Recent Labs: 12/11/2020: ALT 12; TSH 0.054 12/15/2020: Hemoglobin 14.3; Platelets 170 12/16/2020: Magnesium 1.9 12/17/2020: BUN 18; Creatinine, Ser 0.72; Potassium 4.9; Sodium 132  12/11/2020: Cholesterol 113; HDL 49; LDL Cholesterol 39; Total CHOL/HDL Ratio 2.3; Triglycerides 127; VLDL 25   CrCl cannot be calculated (Unknown ideal weight.).   Wt Readings from Last 3 Encounters:  12/11/20 220 lb 7.4 oz (100 kg)  09/30/20 198 lb (89.8 kg)  04/03/20 200 lb (90.7 kg)     Other studies reviewed: Additional studies/records reviewed today include: summarized above  ASSESSMENT AND PLAN:  VT Suspect outflow tract Unable to induce at time of EPS No current indication for ICD given automatic mechanism to her outflow tract VT in a structurally normal heart Amiodarone, metoprolol Tolerating for the most part, she is fatigued though would like to keep her on the current dose of  metoprolol until she has been on amiodarone a few months no driving 67mo, she is aware Labs today  2. Rash Doubt drug reaction Advised to see her PMD, clinic eval  Disposition: F/u with Dr. Lalla Brothers in 2mo or so, sooner if needed.  Current medicines are reviewed at length with the patient today.  The patient did not have any concerns regarding medicines.  Norma Fredrickson, PA-C 01/04/2021 10:33 AM  [place]  [place] [address] [phone] (office)  [phone] (fax)

## 2021-01-06 ENCOUNTER — Ambulatory Visit: Payer: Medicare HMO | Admitting: Physician Assistant

## 2021-01-06 ENCOUNTER — Encounter: Payer: Self-pay | Admitting: Physician Assistant

## 2021-01-06 ENCOUNTER — Other Ambulatory Visit: Payer: Self-pay

## 2021-01-06 VITALS — BP 128/76 | HR 62 | Ht 66.5 in | Wt 194.8 lb

## 2021-01-06 DIAGNOSIS — Z79899 Other long term (current) drug therapy: Secondary | ICD-10-CM | POA: Diagnosis not present

## 2021-01-06 DIAGNOSIS — I472 Ventricular tachycardia, unspecified: Secondary | ICD-10-CM

## 2021-01-06 DIAGNOSIS — T462X5A Adverse effect of other antidysrhythmic drugs, initial encounter: Secondary | ICD-10-CM | POA: Diagnosis not present

## 2021-01-06 DIAGNOSIS — G620 Drug-induced polyneuropathy: Secondary | ICD-10-CM

## 2021-01-06 NOTE — Patient Instructions (Signed)
Medication Instructions:    Your physician recommends that you continue on your current medications as directed. Please refer to the Current Medication list given to you today.'  *If you need a refill on your cardiac medications before your next appointment, please call your pharmacy*   Lab Work:  LFT AND TSH TODAY    If you have labs (blood work) drawn today and your tests are completely normal, you will receive your results only by: [place] (if you have MyChart) OR A paper copy in the mail If you have any lab test that is abnormal or we need to change your treatment, we will call you to review the results.   Testing/Procedures: NONE ORDERED  TODAY    Follow-Up: At [facility], you and your health needs are our priority.  As part of our continuing mission to provide you with exceptional heart care, we have created designated Provider Care Teams.  These Care Teams include your primary Cardiologist (physician) and Advanced Practice Providers (APPs -  Physician Assistants and Nurse Practitioners) who all work together to provide you with the care you need, when you need it.  We recommend signing up for the patient portal called "MyChart".  Sign up information is provided on this After Visit Summary.  MyChart is used to connect with patients for Virtual Visits (Telemedicine).  Patients are able to view lab/test results, encounter notes, upcoming appointments, etc.  Non-urgent messages can be sent to your provider as well.   To learn more about what you can do with MyChart, go to [email or web address].    Your next appointment:   2 month(s)  The format for your next appointment:   In Person  Provider:   Lars Mage, MD{    Other Instructions

## 2021-01-07 ENCOUNTER — Other Ambulatory Visit: Payer: Self-pay | Admitting: *Deleted

## 2021-01-07 DIAGNOSIS — Z79899 Other long term (current) drug therapy: Secondary | ICD-10-CM

## 2021-01-07 LAB — HEPATIC FUNCTION PANEL
ALT: 23 IU/L (ref 0–32)
AST: 22 IU/L (ref 0–40)
Albumin: 4.9 g/dL — ABNORMAL HIGH (ref 3.7–4.7)
Alkaline Phosphatase: 63 IU/L (ref 44–121)
Bilirubin Total: 0.6 mg/dL (ref 0.0–1.2)
Bilirubin, Direct: 0.21 mg/dL (ref 0.00–0.40)
Total Protein: 7 g/dL (ref 6.0–8.5)

## 2021-01-07 LAB — TSH: TSH: 2.11 u[IU]/mL (ref 0.450–4.500)

## 2021-01-15 DIAGNOSIS — N3000 Acute cystitis without hematuria: Secondary | ICD-10-CM | POA: Diagnosis not present

## 2021-01-15 DIAGNOSIS — M059 Rheumatoid arthritis with rheumatoid factor, unspecified: Secondary | ICD-10-CM | POA: Diagnosis not present

## 2021-01-15 DIAGNOSIS — E785 Hyperlipidemia, unspecified: Secondary | ICD-10-CM | POA: Diagnosis not present

## 2021-01-15 DIAGNOSIS — R350 Frequency of micturition: Secondary | ICD-10-CM | POA: Diagnosis not present

## 2021-01-15 DIAGNOSIS — K219 Gastro-esophageal reflux disease without esophagitis: Secondary | ICD-10-CM | POA: Diagnosis not present

## 2021-01-15 DIAGNOSIS — E890 Postprocedural hypothyroidism: Secondary | ICD-10-CM | POA: Diagnosis not present

## 2021-01-15 DIAGNOSIS — F419 Anxiety disorder, unspecified: Secondary | ICD-10-CM | POA: Diagnosis not present

## 2021-01-15 DIAGNOSIS — Z23 Encounter for immunization: Secondary | ICD-10-CM | POA: Diagnosis not present

## 2021-01-15 DIAGNOSIS — I10 Essential (primary) hypertension: Secondary | ICD-10-CM | POA: Diagnosis not present

## 2021-01-15 DIAGNOSIS — G894 Chronic pain syndrome: Secondary | ICD-10-CM | POA: Diagnosis not present

## 2021-01-15 DIAGNOSIS — F324 Major depressive disorder, single episode, in partial remission: Secondary | ICD-10-CM | POA: Diagnosis not present

## 2021-01-15 DIAGNOSIS — J441 Chronic obstructive pulmonary disease with (acute) exacerbation: Secondary | ICD-10-CM | POA: Diagnosis not present

## 2021-01-15 DIAGNOSIS — E1169 Type 2 diabetes mellitus with other specified complication: Secondary | ICD-10-CM | POA: Diagnosis not present

## 2021-01-15 DIAGNOSIS — I499 Cardiac arrhythmia, unspecified: Secondary | ICD-10-CM | POA: Diagnosis not present

## 2021-01-20 ENCOUNTER — Ambulatory Visit: Payer: Medicare HMO | Admitting: Physician Assistant

## 2021-01-28 ENCOUNTER — Ambulatory Visit: Payer: Medicare HMO | Admitting: Anesthesiology

## 2021-02-03 ENCOUNTER — Telehealth: Payer: Self-pay | Admitting: Physician Assistant

## 2021-02-03 NOTE — Telephone Encounter (Signed)
Pt c/o medication issue:  1. Name of Medication: amiodarone (PACERONE) 200 MG tablet (new med) metoprolol succinate (TOPROL-XL) 50 MG 24 hr tablet (was increased)  2. How are you currently taking this medication (dosage and times per day)?   3. Are you having a reaction (difficulty breathing--STAT)? no  4. What is your medication issue? Patient's daughter called stating the medications are making her mother very light headed, these medication are making her unsteady on her feet it has been worse this past week.  Yesterday she fell, but thankfully she landed on her toilet.   Today is she very unsteady on her feet. Daughter said "she is like a baby deer learning how to walk."

## 2021-02-03 NOTE — Telephone Encounter (Signed)
Spoke with patient Dtr ( Tammy) per Joseph Art to cut Metoprolol in half and take 25 mg twice a day. Patient DTR TOLD  Dtr told to monitor symptoms and blood pressure and keep updated.

## 2021-02-04 ENCOUNTER — Other Ambulatory Visit: Payer: Self-pay | Admitting: Anesthesiology

## 2021-02-04 ENCOUNTER — Encounter: Payer: Self-pay | Admitting: Anesthesiology

## 2021-02-04 ENCOUNTER — Ambulatory Visit (HOSPITAL_BASED_OUTPATIENT_CLINIC_OR_DEPARTMENT_OTHER): Payer: Medicare HMO | Admitting: Anesthesiology

## 2021-02-04 ENCOUNTER — Other Ambulatory Visit: Payer: Self-pay

## 2021-02-04 ENCOUNTER — Ambulatory Visit
Admission: RE | Admit: 2021-02-04 | Discharge: 2021-02-04 | Disposition: A | Discharge status: Home / Self Care | Payer: Medicare HMO | Source: Ambulatory Visit | Attending: Anesthesiology | Admitting: Anesthesiology

## 2021-02-04 VITALS — BP 173/97 | HR 50 | Temp 96.9°F | Resp 16 | Ht 67.0 in | Wt 180.0 lb

## 2021-02-04 DIAGNOSIS — M797 Fibromyalgia: Secondary | ICD-10-CM

## 2021-02-04 DIAGNOSIS — M5136 Other intervertebral disc degeneration, lumbar region: Secondary | ICD-10-CM | POA: Diagnosis not present

## 2021-02-04 DIAGNOSIS — F1190 Opioid use, unspecified, uncomplicated: Secondary | ICD-10-CM | POA: Diagnosis not present

## 2021-02-04 DIAGNOSIS — M0579 Rheumatoid arthritis with rheumatoid factor of multiple sites without organ or systems involvement: Secondary | ICD-10-CM

## 2021-02-04 DIAGNOSIS — M5432 Sciatica, left side: Secondary | ICD-10-CM

## 2021-02-04 DIAGNOSIS — M5431 Sciatica, right side: Secondary | ICD-10-CM

## 2021-02-04 DIAGNOSIS — M25551 Pain in right hip: Secondary | ICD-10-CM

## 2021-02-04 DIAGNOSIS — R52 Pain, unspecified: Secondary | ICD-10-CM | POA: Diagnosis not present

## 2021-02-04 DIAGNOSIS — M4716 Other spondylosis with myelopathy, lumbar region: Secondary | ICD-10-CM | POA: Diagnosis not present

## 2021-02-04 DIAGNOSIS — M25552 Pain in left hip: Secondary | ICD-10-CM

## 2021-02-04 DIAGNOSIS — G894 Chronic pain syndrome: Secondary | ICD-10-CM | POA: Diagnosis not present

## 2021-02-04 DIAGNOSIS — M79642 Pain in left hand: Secondary | ICD-10-CM | POA: Insufficient documentation

## 2021-02-04 DIAGNOSIS — M79641 Pain in right hand: Secondary | ICD-10-CM | POA: Insufficient documentation

## 2021-02-04 DIAGNOSIS — M51369 Other intervertebral disc degeneration, lumbar region without mention of lumbar back pain or lower extremity pain: Secondary | ICD-10-CM

## 2021-02-04 MED ORDER — TRIAMCINOLONE ACETONIDE 40 MG/ML IJ SUSP
INTRAMUSCULAR | Status: AC
  Filled 2021-02-04: qty 1

## 2021-02-04 MED ORDER — SODIUM CHLORIDE (PF) 0.9 % IJ SOLN
INTRAMUSCULAR | Status: AC
  Filled 2021-02-04: qty 10

## 2021-02-04 MED ORDER — LIDOCAINE HCL (PF) 1 % IJ SOLN
5.0000 mL | Freq: Once | INTRAMUSCULAR | Status: AC
  Administered 2021-02-04: 5 mL via SUBCUTANEOUS

## 2021-02-04 MED ORDER — TRIAMCINOLONE ACETONIDE 40 MG/ML IJ SUSP
40.0000 mg | Freq: Once | INTRAMUSCULAR | Status: AC
  Administered 2021-02-04: 40 mg

## 2021-02-04 MED ORDER — LIDOCAINE HCL (PF) 1 % IJ SOLN
INTRAMUSCULAR | Status: AC
  Filled 2021-02-04: qty 5

## 2021-02-04 MED ORDER — IOHEXOL 180 MG/ML  SOLN
10.0000 mL | Freq: Once | INTRAMUSCULAR | Status: AC | PRN
  Administered 2021-02-04: 5 mL via EPIDURAL

## 2021-02-04 MED ORDER — SODIUM CHLORIDE 0.9% FLUSH
10.0000 mL | Freq: Once | INTRAVENOUS | Status: AC
  Administered 2021-02-04: 5 mL

## 2021-02-04 MED ORDER — HYDROCODONE-ACETAMINOPHEN 7.5-325 MG PO TABS: 1.0000 | ORAL_TABLET | Freq: Four times a day (QID) | ORAL | 0 refills | Status: DC | PRN

## 2021-02-04 MED ORDER — ROPIVACAINE HCL 2 MG/ML IJ SOLN
INTRAMUSCULAR | Status: AC
  Filled 2021-02-04: qty 20

## 2021-02-04 MED ORDER — HYDROCODONE-ACETAMINOPHEN 7.5-325 MG PO TABS: 1.0000 | ORAL_TABLET | Freq: Four times a day (QID) | ORAL | 0 refills | Status: AC | PRN

## 2021-02-04 MED ORDER — ROPIVACAINE HCL 2 MG/ML IJ SOLN
10.0000 mL | Freq: Once | INTRAMUSCULAR | Status: AC
  Administered 2021-02-04: 1 mL via EPIDURAL

## 2021-02-04 NOTE — Patient Instructions (Signed)

## 2021-02-04 NOTE — Progress Notes (Signed)
Nursing Pain Medication Assessment:  Safety precautions to be maintained throughout the outpatient stay will include: orient to surroundings, keep bed in low position, maintain call bell within reach at all times, provide assistance with transfer out of bed and ambulation.  Medication Inspection Compliance: Pill count conducted under aseptic conditions, in front of the patient. Neither the pills nor the bottle was removed from the patient's sight at any time. Once count was completed pills were immediately returned to the patient in their original bottle.  Medication: Hydrocodone/APAP Pill/Patch Count:  62 of 120 pills remain Pill/Patch Appearance: Markings consistent with prescribed medication Bottle Appearance: Standard pharmacy container. Clearly labeled. Filled Date: 66 / 23 / 2022 Last Medication intake:  Today

## 2021-02-04 NOTE — Progress Notes (Signed)
Subjective:  Patient ID: Kelsey Diaz, female    DOB: [birth year]  Age: 75 y.o. MRN: [id number]  CC: Back Pain (low)   Procedure: L5-S1 epidural steroid under fluoroscopic guidance with no sedation  HPI Kelsey Diaz presents for reevaluation.  She is continue to have severe sciatic symptoms.  This is primarily affecting the left greater than right lower extremity but present on both sides.  The pain has been incapacitating despite efforts at physical therapy and home exercise regimens which have been ineffective.  In the past she has had epidural steroid injections with good relief generally getting 70 to 75% improvement especially in the sciatica symptoms for over 2 months.  Her last epidural was in July of this year and prior to that February and both have been very successful for her.  She continues take her medication regimen as prescribed and these do help significantly but the sciatica symptoms have reoccurred.  No change in bowel or bladder function or lower extremity strength or function is noted.  Otherwise she is in her usual state of health.  Outpatient Medications Prior to Visit  Medication Sig Dispense Refill   ACCU-CHEK GUIDE test strip      Accu-Chek Softclix Lancets lancets      albuterol (VENTOLIN HFA) 108 (90 Base) MCG/ACT inhaler Inhale 2 puffs into the lungs every 6 (six) hours as needed for shortness of breath.      amiodarone (PACERONE) 200 MG tablet Take 1 tablet (200 mg total) by mouth daily. 30 tablet 5   aspirin 81 MG chewable tablet 1 tablet     Blood Glucose Monitoring Suppl (ACCU-CHEK GUIDE ME) w/Device KIT      Calcium Carbonate-Vitamin D 600-200 MG-UNIT TABS Take 1 tablet by mouth daily.      Clobetasol Prop Emollient Base 0.05 % emollient cream Apply 1 application topically 2 (two) times daily.     DULoxetine (CYMBALTA) 60 MG capsule Take 1 capsule by mouth 2 (two) times daily.     etanercept (ENBREL) 50 MG/ML injection Inject 50 mg into the skin once a  week.     fluocinonide (LIDEX) 0.05 % external solution Apply 1 application topically 2 (two) times daily as needed (skin irritation).      fluticasone (FLONASE) 50 MCG/ACT nasal spray 2 sprays     folic acid (FOLVITE) 1 MG tablet Take 1 tablet by mouth daily.     HYDROcodone-acetaminophen (NORCO) 7.5-325 MG tablet Take 1 tablet by mouth every 6 (six) hours as needed for moderate pain or severe pain. 120 tablet 0   meloxicam (MOBIC) 15 MG tablet Take 15 mg by mouth daily.     metoprolol succinate (TOPROL-XL) 50 MG 24 hr tablet Take 1 tablet (50 mg total) by mouth 2 (two) times daily. Take with or immediately following a meal. 60 tablet 6   Multiple Vitamin (MULTI-VITAMIN PO) Take 1 tablet by mouth daily.      nystatin (MYCOSTATIN/NYSTOP) powder 1 application     Omega-3 Fatty Acids (FISH OIL PO) Take 1,000 mg by mouth daily.      omeprazole (PRILOSEC) 40 MG capsule Take 1 capsule by mouth daily.  1   OSPHENA 60 MG TABS Take 1 tablet by mouth daily.     rosuvastatin (CRESTOR) 20 MG tablet Take 20 mg by mouth once a week.     amiodarone (PACERONE) 200 MG tablet 1 tablet (Patient not taking: Reported on 02/04/2021)     DULoxetine (CYMBALTA) 60 MG capsule  Take 120 mg by mouth daily.  (Patient not taking: Reported on 02/04/2021)     LORazepam (ATIVAN) 0.5 MG tablet Take 0.5 mg by mouth daily as needed for anxiety. (Patient not taking: Reported on 01/06/2021)     meloxicam (MOBIC) 15 MG tablet Take 1 tablet by mouth daily. (Patient not taking: Reported on 02/04/2021)     methotrexate 2.5 MG tablet Take 20 mg by mouth once a week. (Patient not taking: Reported on 01/06/2021)     omeprazole (PRILOSEC) 40 MG capsule 1 tablet (Patient not taking: Reported on 02/04/2021)     Suvorexant (BELSOMRA) 10 MG TABS Take 1 tablet by mouth at bedtime as needed. (Patient not taking: Reported on 01/06/2021)     No facility-administered medications prior to visit.    Review of Systems CNS: No confusion or  sedation Cardiac: No angina or palpitations GI: No abdominal pain or constipation Constitutional: No nausea vomiting fevers or chills  Objective:  BP (!) 173/97 Comment: MD aware of BP's.  advised to see PCP or cardiologist   Pulse (!) 50    Temp (!) 96.9 F (36.1 C)    Resp 16    Ht 5' 7" (1.702 m)    Wt 180 lb (81.6 kg)    SpO2 96%    BMI 28.19 kg/m    BP Readings from Last 3 Encounters:  02/04/21 (!) 173/97  01/06/21 128/76  12/17/20 130/80     Wt Readings from Last 3 Encounters:  02/04/21 180 lb (81.6 kg)  01/06/21 194 lb 12.8 oz (88.4 kg)  12/11/20 220 lb 7.4 oz (100 kg)     Physical Exam Pt is alert and oriented PERRL EOMI HEART IS RRR no murmur or rub LCTA no wheezing or rales MUSCULOSKELETAL reveals some paraspinous muscle tenderness in the lumbar region but no overt trigger points.  She ambulates with an antalgic gait and has a positive straight leg raise on the left side.  Equivocal on the right and her muscle tone and bulk is at baseline.  Labs  Lab Results  Component Value Date   HGBA1C 6.1 (H) 12/11/2020   Lab Results  Component Value Date   LDLCALC 39 12/11/2020   CREATININE 0.72 12/17/2020    -------------------------------------------------------------------------------------------------------------------- Lab Results  Component Value Date   WBC 6.5 12/15/2020   HGB 14.3 12/15/2020   HCT 42.5 12/15/2020   PLT 170 12/15/2020   GLUCOSE 113 (H) 12/17/2020   CHOL 113 12/11/2020   TRIG 127 12/11/2020   HDL 49 12/11/2020   LDLCALC 39 12/11/2020   ALT 23 01/06/2021   AST 22 01/06/2021   NA 132 (L) 12/17/2020   K 4.9 12/17/2020   CL 101 12/17/2020   CREATININE 0.72 12/17/2020   BUN 18 12/17/2020   CO2 21 (L) 12/17/2020   TSH 2.110 01/06/2021   INR 1.0 12/11/2020   HGBA1C 6.1 (H) 12/11/2020    --------------------------------------------------------------------------------------------------------------------- No results  found.   Assessment & Plan:   Telina was seen today for back pain.  Diagnoses and all orders for this visit:  Chronic, continuous use of opioids  DDD (degenerative disc disease), lumbar -     Lumbar Epidural Injection  Bilateral sciatica -     Lumbar Epidural Injection  Bilateral hip pain -     Lumbar Epidural Injection  Lumbar spondylosis with myelopathy -     Lumbar Epidural Injection  Fibromyalgia  Chronic pain syndrome  Rheumatoid arthritis involving multiple sites with positive rheumatoid factor ([place])  Pain in both hands  Other orders -     HYDROcodone-acetaminophen (NORCO) 7.5-325 MG tablet; Take 1 tablet by mouth every 6 (six) hours as needed for moderate pain or severe pain. -     HYDROcodone-acetaminophen (NORCO) 7.5-325 MG tablet; Take 1 tablet by mouth every 6 (six) hours as needed for moderate pain or severe pain. -     triamcinolone acetonide (KENALOG-40) injection 40 mg -     sodium chloride flush (NS) 0.9 % injection 10 mL -     ropivacaine (PF) 2 mg/mL (0.2%) (NAROPIN) injection 10 mL -     lidocaine (PF) (XYLOCAINE) 1 % injection 5 mL -     iohexol (OMNIPAQUE) 180 MG/ML injection 10 mL        ----------------------------------------------------------------------------------------------------------------------  Problem List Items Addressed This Visit       Unprioritized   Bilateral hip pain   Fibromyalgia   Relevant Medications   aspirin 81 MG chewable tablet   DULoxetine (CYMBALTA) 60 MG capsule   meloxicam (MOBIC) 15 MG tablet   HYDROcodone-acetaminophen (NORCO) 7.5-325 MG tablet (Start on 02/14/2021)   HYDROcodone-acetaminophen (NORCO) 7.5-325 MG tablet (Start on 03/17/2021)   Hand pain   Rheumatoid arthritis (HCC)   Relevant Medications   aspirin 81 MG chewable tablet   meloxicam (MOBIC) 15 MG tablet   HYDROcodone-acetaminophen (NORCO) 7.5-325 MG tablet (Start on 02/14/2021)   HYDROcodone-acetaminophen ([place]) 7.5-325 MG tablet (Start  on 03/17/2021)   Other Visit Diagnoses     Chronic, continuous use of opioids    -  Primary   DDD (degenerative disc disease), lumbar       Relevant Medications   aspirin 81 MG chewable tablet   meloxicam (MOBIC) 15 MG tablet   HYDROcodone-acetaminophen (NORCO) 7.5-325 MG tablet (Start on 02/14/2021)   HYDROcodone-acetaminophen (NORCO) 7.5-325 MG tablet (Start on 03/17/2021)   triamcinolone acetonide (KENALOG-40) injection 40 mg (Completed)   Bilateral sciatica       Relevant Medications   DULoxetine (CYMBALTA) 60 MG capsule   Lumbar spondylosis with myelopathy       Relevant Medications   aspirin 81 MG chewable tablet   meloxicam (MOBIC) 15 MG tablet   HYDROcodone-acetaminophen (NORCO) 7.5-325 MG tablet (Start on 02/14/2021)   HYDROcodone-acetaminophen (NORCO) 7.5-325 MG tablet (Start on 03/17/2021)   triamcinolone acetonide (KENALOG-40) injection 40 mg (Completed)   Chronic pain syndrome       Relevant Medications   aspirin 81 MG chewable tablet   DULoxetine (CYMBALTA) 60 MG capsule   meloxicam (MOBIC) 15 MG tablet   HYDROcodone-acetaminophen (NORCO) 7.5-325 MG tablet (Start on 02/14/2021)   HYDROcodone-acetaminophen (NORCO) 7.5-325 MG tablet (Start on 03/17/2021)   triamcinolone acetonide (KENALOG-40) injection 40 mg (Completed)   ropivacaine (PF) 2 mg/mL (0.2%) (NAROPIN) injection 10 mL (Completed)   lidocaine (PF) (XYLOCAINE) 1 % injection 5 mL (Completed)         ----------------------------------------------------------------------------------------------------------------------  1. DDD (degenerative disc disease), lumbar We will proceed with a repeat epidural as requested for the patient today.  The risks and benefits of been reviewed and all questions have been answered.  I want her to continue with her stretching strengthening home routine with return to clinic scheduled in 2 months. - Lumbar Epidural Injection  2. Bilateral sciatica As above - Lumbar Epidural  Injection  3. Bilateral hip pain As above - Lumbar Epidural Injection  4. Lumbar spondylosis with myelopathy  - Lumbar Epidural Injection  5. Chronic, continuous use of opioids As above and I have  reviewed the [facility] practitioner database information it is appropriate for refill.  No other changes in her pain management regimen will be initiated.  6. Fibromyalgia Continue home physical therapy exercises as reviewed  7. Chronic pain syndrome As above  8. Rheumatoid arthritis involving multiple sites with positive rheumatoid factor ([place]) As above and continue follow-up with her primary care physicians and rheumatologist.  9. Pain in both hands     ----------------------------------------------------------------------------------------------------------------------  I am having Beckie Busing. [place] start on HYDROcodone-acetaminophen and HYDROcodone-acetaminophen. I am also having her maintain her Calcium Carbonate-Vitamin D, Multiple Vitamin (MULTI-VITAMIN PO), DULoxetine, albuterol, Omega-3 Fatty Acids (FISH OIL PO), fluocinonide, omeprazole, Clobetasol Prop Emollient Base, Osphena, Accu-Chek Guide Me, Accu-Chek Guide, Accu-Chek Softclix Lancets, LORazepam, Enbrel, methotrexate, meloxicam, rosuvastatin, folic acid, Belsomra, amiodarone, metoprolol succinate, HYDROcodone-acetaminophen, amiodarone, aspirin, DULoxetine, meloxicam, fluticasone, nystatin, and omeprazole. We administered triamcinolone acetonide, sodium chloride flush, ropivacaine (PF) 2 mg/mL (0.2%), lidocaine (PF), and iohexol.   Meds ordered this encounter  Medications   HYDROcodone-acetaminophen (NORCO) 7.5-325 MG tablet    Sig: Take 1 tablet by mouth every 6 (six) hours as needed for moderate pain or severe pain.    Dispense:  120 tablet    Refill:  0   HYDROcodone-acetaminophen (NORCO) 7.5-325 MG tablet    Sig: Take 1 tablet by mouth every 6 (six) hours as needed for moderate pain or severe pain.     Dispense:  120 tablet    Refill:  0   triamcinolone acetonide (KENALOG-40) injection 40 mg   sodium chloride flush (NS) 0.9 % injection 10 mL   ropivacaine (PF) 2 mg/mL (0.2%) (NAROPIN) injection 10 mL   lidocaine (PF) (XYLOCAINE) 1 % injection 5 mL   iohexol (OMNIPAQUE) 180 MG/ML injection 10 mL   Patient's Medications  New Prescriptions   HYDROCODONE-ACETAMINOPHEN (NORCO) 7.5-325 MG TABLET    Take 1 tablet by mouth every 6 (six) hours as needed for moderate pain or severe pain.   HYDROCODONE-ACETAMINOPHEN (NORCO) 7.5-325 MG TABLET    Take 1 tablet by mouth every 6 (six) hours as needed for moderate pain or severe pain.  Previous Medications   ACCU-CHEK GUIDE TEST STRIP       ACCU-CHEK SOFTCLIX LANCETS LANCETS       ALBUTEROL (VENTOLIN HFA) 108 (90 BASE) MCG/ACT INHALER    Inhale 2 puffs into the lungs every 6 (six) hours as needed for shortness of breath.    AMIODARONE (PACERONE) 200 MG TABLET    Take 1 tablet (200 mg total) by mouth daily.   AMIODARONE (PACERONE) 200 MG TABLET    1 tablet   ASPIRIN 81 MG CHEWABLE TABLET    1 tablet   BLOOD GLUCOSE MONITORING SUPPL (ACCU-CHEK GUIDE ME) W/DEVICE KIT       CALCIUM CARBONATE-VITAMIN D 600-200 MG-UNIT TABS    Take 1 tablet by mouth daily.    CLOBETASOL PROP EMOLLIENT BASE 0.05 % EMOLLIENT CREAM    Apply 1 application topically 2 (two) times daily.   DULOXETINE (CYMBALTA) 60 MG CAPSULE    Take 120 mg by mouth daily.    DULOXETINE (CYMBALTA) 60 MG CAPSULE    Take 1 capsule by mouth 2 (two) times daily.   ETANERCEPT (ENBREL) 50 MG/ML INJECTION    Inject 50 mg into the skin once a week.   FLUOCINONIDE (LIDEX) 0.05 % EXTERNAL SOLUTION    Apply 1 application topically 2 (two) times daily as needed (skin irritation).    FLUTICASONE (FLONASE) 50 MCG/ACT NASAL SPRAY  2 sprays   FOLIC ACID (FOLVITE) 1 MG TABLET    Take 1 tablet by mouth daily.   HYDROCODONE-ACETAMINOPHEN (NORCO) 7.5-325 MG TABLET    Take 1 tablet by mouth every 6 (six) hours as  needed for moderate pain or severe pain.   LORAZEPAM (ATIVAN) 0.5 MG TABLET    Take 0.5 mg by mouth daily as needed for anxiety.   MELOXICAM (MOBIC) 15 MG TABLET    Take 15 mg by mouth daily.   MELOXICAM (MOBIC) 15 MG TABLET    Take 1 tablet by mouth daily.   METHOTREXATE 2.5 MG TABLET    Take 20 mg by mouth once a week.   METOPROLOL SUCCINATE (TOPROL-XL) 50 MG 24 HR TABLET    Take 1 tablet (50 mg total) by mouth 2 (two) times daily. Take with or immediately following a meal.   MULTIPLE VITAMIN (MULTI-VITAMIN PO)    Take 1 tablet by mouth daily.    NYSTATIN (MYCOSTATIN/NYSTOP) POWDER    1 application   OMEGA-3 FATTY ACIDS (FISH OIL PO)    Take 1,000 mg by mouth daily.    OMEPRAZOLE (PRILOSEC) 40 MG CAPSULE    Take 1 capsule by mouth daily.   OMEPRAZOLE (PRILOSEC) 40 MG CAPSULE    1 tablet   OSPHENA 60 MG TABS    Take 1 tablet by mouth daily.   ROSUVASTATIN (CRESTOR) 20 MG TABLET    Take 20 mg by mouth once a week.   SUVOREXANT (BELSOMRA) 10 MG TABS    Take 1 tablet by mouth at bedtime as needed.  Modified Medications   No medications on file  Discontinued Medications   No medications on file   ----------------------------------------------------------------------------------------------------------------------  Follow-up: Return in about 2 months (around 04/07/2021) for evaluation, med refill.   Procedure: L5-S1 LESI with fluoroscopic guidance and no moderate sedation  NOTE: The risks, benefits, and expectations of the procedure have been discussed and explained to the patient who was understanding and in agreement with suggested treatment plan. No guarantees were made.  DESCRIPTION OF PROCEDURE: Lumbar epidural steroid injection with no IV Versed, EKG, blood pressure, pulse, and pulse oximetry monitoring. The procedure was performed with the patient in the prone position under fluoroscopic guidance.  Sterile prep x3 was initiated and I then injected subcutaneous lidocaine to the overlying  L5-S1 site after its fluoroscopic identifictation.  Using strict aseptic technique, I then advanced an 18-gauge Tuohy epidural needle in the midline using interlaminar approach via loss-of-resistance to saline technique. There was negative aspiration for heme or  CSF.  I then confirmed position with both AP and Lateral fluoroscan.  2 cc of contrast dye were injected and a  total of 5 mL of Preservative-Free normal saline mixed with 40 mg of Kenalog and 1cc Ropicaine 0.2 percent were injected incrementally via the  epidurally placed needle. The needle was removed. The patient tolerated the injection well and was convalesced and discharged to home in stable condition. Should the patient have any post procedure difficulty they have been instructed on how to contact us for assistance.    Molli Barrows, MD

## 2021-02-05 ENCOUNTER — Telehealth: Payer: Self-pay

## 2021-02-05 NOTE — Telephone Encounter (Signed)
Post procedure phone call. Patient states she is doing good.  

## 2021-02-06 ENCOUNTER — Encounter: Payer: Self-pay | Admitting: Anesthesiology

## 2021-02-12 DIAGNOSIS — E1169 Type 2 diabetes mellitus with other specified complication: Secondary | ICD-10-CM | POA: Diagnosis not present

## 2021-02-12 DIAGNOSIS — K219 Gastro-esophageal reflux disease without esophagitis: Secondary | ICD-10-CM | POA: Diagnosis not present

## 2021-02-12 DIAGNOSIS — E785 Hyperlipidemia, unspecified: Secondary | ICD-10-CM | POA: Diagnosis not present

## 2021-02-12 DIAGNOSIS — M47816 Spondylosis without myelopathy or radiculopathy, lumbar region: Secondary | ICD-10-CM | POA: Diagnosis not present

## 2021-02-12 DIAGNOSIS — M059 Rheumatoid arthritis with rheumatoid factor, unspecified: Secondary | ICD-10-CM | POA: Diagnosis not present

## 2021-02-12 DIAGNOSIS — F324 Major depressive disorder, single episode, in partial remission: Secondary | ICD-10-CM | POA: Diagnosis not present

## 2021-02-12 DIAGNOSIS — J441 Chronic obstructive pulmonary disease with (acute) exacerbation: Secondary | ICD-10-CM | POA: Diagnosis not present

## 2021-02-12 DIAGNOSIS — J449 Chronic obstructive pulmonary disease, unspecified: Secondary | ICD-10-CM | POA: Diagnosis not present

## 2021-02-12 DIAGNOSIS — I10 Essential (primary) hypertension: Secondary | ICD-10-CM | POA: Diagnosis not present

## 2021-02-13 DIAGNOSIS — R296 Repeated falls: Secondary | ICD-10-CM | POA: Diagnosis not present

## 2021-02-13 DIAGNOSIS — R001 Bradycardia, unspecified: Secondary | ICD-10-CM | POA: Diagnosis not present

## 2021-02-13 DIAGNOSIS — Q046 Congenital cerebral cysts: Secondary | ICD-10-CM | POA: Diagnosis not present

## 2021-02-13 DIAGNOSIS — Z5181 Encounter for therapeutic drug level monitoring: Secondary | ICD-10-CM | POA: Diagnosis not present

## 2021-02-13 DIAGNOSIS — R2681 Unsteadiness on feet: Secondary | ICD-10-CM | POA: Diagnosis not present

## 2021-02-13 DIAGNOSIS — I10 Essential (primary) hypertension: Secondary | ICD-10-CM | POA: Diagnosis not present

## 2021-02-13 DIAGNOSIS — R42 Dizziness and giddiness: Secondary | ICD-10-CM | POA: Diagnosis not present

## 2021-02-14 ENCOUNTER — Telehealth: Payer: Self-pay | Admitting: Physician Assistant

## 2021-02-14 NOTE — Telephone Encounter (Signed)
Spoke with the patient's daughter who reports that the patient has continued to have lightheadedness. She fell 4 times in the house on Wednesday. She states that she went and saw her PCP and they did blood work. They are waiting on results from amiodarone levels, however other lab work came back normal. The patient does use a walker but still fell while using the walker. The daughter states that she patient did not injury herself and did not pass out. She also reports that her hands have been trembling. She does not have any of the patient's BP readings but states that it has been elevated. Her heart rate has been in the 50s. Patient does live with other daughter so she is not alone. Need to be careful when getting up too quickly and staying hydrated.

## 2021-02-14 NOTE — Telephone Encounter (Signed)
Spoke with the patient's daughter and advised that the patient should stop amiodarone and keep FU with Dr. Quentin Ore. She verbalized understanding and will let the patient know.

## 2021-02-14 NOTE — Telephone Encounter (Signed)
° °  Pt c/o medication issue:  1. Name of Medication: amiodarone (PACERONE) 200 MG tablet  2. How are you currently taking this medication (dosage and times per day)? Take 1 tablet (200 mg total) by mouth daily.  3. Are you having a reaction (difficulty breathing--STAT)?   4. What is your medication issue? Pt's daughter called, she said since pt taking this meds, pt been very dizzy, shaking and forgetful. She said pt fell 4 times last wednesday because of the dizziness, they went to see pt's pcp and they did a lab work to check if amiodarone is on a toxic level. Result is not ready yet and they are calling renee to ask for recommendations

## 2021-02-25 ENCOUNTER — Ambulatory Visit
Admission: RE | Admit: 2021-02-25 | Discharge: 2021-02-25 | Disposition: A | Discharge status: Home / Self Care | Payer: Medicare HMO | Source: Ambulatory Visit | Attending: Family Medicine | Admitting: Family Medicine

## 2021-02-25 ENCOUNTER — Other Ambulatory Visit: Payer: Self-pay | Admitting: Physician Assistant

## 2021-02-25 ENCOUNTER — Other Ambulatory Visit: Payer: Self-pay | Admitting: Family Medicine

## 2021-02-25 ENCOUNTER — Other Ambulatory Visit: Payer: Self-pay

## 2021-02-25 DIAGNOSIS — M542 Cervicalgia: Secondary | ICD-10-CM | POA: Diagnosis not present

## 2021-02-25 DIAGNOSIS — R2681 Unsteadiness on feet: Secondary | ICD-10-CM

## 2021-02-25 DIAGNOSIS — M4312 Spondylolisthesis, cervical region: Secondary | ICD-10-CM | POA: Diagnosis not present

## 2021-03-04 ENCOUNTER — Ambulatory Visit: Payer: Medicare HMO | Admitting: Cardiology

## 2021-03-04 ENCOUNTER — Other Ambulatory Visit: Payer: Medicare HMO | Admitting: *Deleted

## 2021-03-04 ENCOUNTER — Other Ambulatory Visit: Payer: Self-pay

## 2021-03-04 ENCOUNTER — Encounter: Payer: Self-pay | Admitting: Cardiology

## 2021-03-04 VITALS — BP 124/60 | HR 62 | Ht 67.0 in | Wt 196.4 lb

## 2021-03-04 DIAGNOSIS — T462X5D Adverse effect of other antidysrhythmic drugs, subsequent encounter: Secondary | ICD-10-CM

## 2021-03-04 DIAGNOSIS — G620 Drug-induced polyneuropathy: Secondary | ICD-10-CM | POA: Diagnosis not present

## 2021-03-04 DIAGNOSIS — I472 Ventricular tachycardia, unspecified: Secondary | ICD-10-CM

## 2021-03-04 DIAGNOSIS — R002 Palpitations: Secondary | ICD-10-CM | POA: Diagnosis not present

## 2021-03-04 DIAGNOSIS — Z79899 Other long term (current) drug therapy: Secondary | ICD-10-CM | POA: Diagnosis not present

## 2021-03-04 NOTE — Patient Instructions (Addendum)
Medication Instructions:  Your physician recommends that you continue on your current medications as directed. Please refer to the Current Medication list given to you today. *If you need a refill on your cardiac medications before your next appointment, please call your pharmacy*  Lab Work: None. If you have labs (blood work) drawn today and your tests are completely normal, you will receive your results only by: [place] (if you have MyChart) OR A paper copy in the mail If you have any lab test that is abnormal or we need to change your treatment, we will call you to review the results.  Testing/Procedures: None.  Follow-Up: At [facility], you and your health needs are our priority.  As part of our continuing mission to provide you with exceptional heart care, we have created designated Provider Care Teams.  These Care Teams include your primary Cardiologist (physician) and Advanced Practice Providers (APPs -  Physician Assistants and Nurse Practitioners) who all work together to provide you with the care you need, when you need it.  Your physician wants you to follow-up in: 6 months with  one of the following Advanced Practice Providers on your designated Care Team:    Legrand Como "Jonni Sanger" Chalmers Cater, [place]   You will receive a reminder letter in the mail two months in advance. If you don't receive a letter, please call our office to schedule the follow-up appointment.  We recommend signing up for the patient portal called "MyChart".  Sign up information is provided on this After Visit Summary.  MyChart is used to connect with patients for Virtual Visits (Telemedicine).  Patients are able to view lab/test results, encounter notes, upcoming appointments, etc.  Non-urgent messages can be sent to your provider as well.   To learn more about what you can do with MyChart, go to [email or web address].    Any Other Special Instructions Will Be Listed Below (If Applicable).

## 2021-03-04 NOTE — Progress Notes (Signed)
Electrophysiology Office Follow up Visit Note:    Date:  03/04/2021   ID:  Kelsey Diaz, DOB 10-14-1945, MRN [id number]  PCP:  Harlan Stains, MD  [facility] Cardiologist:  Minus Breeding, MD  [facility] Electrophysiologist:  Vickie Epley, MD    Interval History:    Kelsey Diaz is a 76 y.o. female who presents for a follow up visit.  I last saw the patient when she presented for EP study and possible PVC/VT ablation on December 16, 2020.  During the procedure her ventricular tachycardia was noninducible.  She was previously started on amiodarone but did not tolerate this medication with neurologic side effects.  The patient stopped the medication and the side effects slowly resolved.  She is with her son today in clinic.  She tells me that her palpitations have improved although she still has occasional symptoms.  She reports constant fatigue and associated diffuse body pain.  She has a diagnosis of fibromyalgia and takes chronic opioids.      Past Medical History:  Diagnosis Date   Allergic rhinitis    Anxiety    Asthma    Bilateral hip pain 11/28/2018   Colitis    COPD (chronic obstructive pulmonary disease) ([place])    Fibromyalgia 05/16/2019   GERD (gastroesophageal reflux disease)    With esophageal strictures   Hypertension    Hyperthyroidism    OSA (obstructive sleep apnea) 02/09/2012   No mask.  Did not tolerate   Osteoarthritis    Rheumatoid arthritis ([place]) 10/03/2018    Past Surgical History:  Procedure Laterality Date   BREAST BIOPSY     x2   CATARACT EXTRACTION W/PHACO Left 09/27/2018   Procedure: CATARACT EXTRACTION PHACO AND INTRAOCULAR LENS PLACEMENT ([place]) LEFT;  Surgeon: Birder Robson, MD;  Location: [place];  Service: Ophthalmology;  Laterality: Left;   CATARACT EXTRACTION W/PHACO Right 11/29/2018   Procedure: CATARACT EXTRACTION PHACO AND INTRAOCULAR LENS PLACEMENT ([place]) RIGHT;  Surgeon: Birder Robson, MD;  Location:  [place];  Service: Ophthalmology;  Laterality: Right;  1:22 20.1% 16.64   CHOLECYSTECTOMY     2005   KNEE SURGERY Right 2013   two torn ligaments repaired.    LEFT HEART CATH AND CORONARY ANGIOGRAPHY N/A 06/08/2017   Procedure: LEFT HEART CATH AND CORONARY ANGIOGRAPHY;  Surgeon: Troy Sine, MD;  Location: [place] [facility];  Service: Cardiovascular;  Laterality: N/A;   LEFT HEART CATH AND CORONARY ANGIOGRAPHY N/A 12/12/2020   Procedure: LEFT HEART CATH AND CORONARY ANGIOGRAPHY;  Surgeon: Lorretta Harp, MD;  Location: [place] [facility];  Service: Cardiovascular;  Laterality: N/A;   NASAL SINUS SURGERY     V TACH ABLATION N/A 12/16/2020   Procedure: V TACH ABLATION;  Surgeon: Vickie Epley, MD;  Location: [place] [facility];  Service: Cardiovascular;  Laterality: N/A;   VAGINAL HYSTERECTOMY      Current Medications: Current Meds  Medication Sig   ACCU-CHEK GUIDE test strip    Accu-Chek Softclix Lancets lancets    albuterol (VENTOLIN HFA) 108 (90 Base) MCG/ACT inhaler Inhale 2 puffs into the lungs every 6 (six) hours as needed for shortness of breath.    aspirin 81 MG chewable tablet 1 tablet   Blood Glucose Monitoring Suppl (ACCU-CHEK GUIDE ME) w/Device KIT    Calcium Carbonate-Vitamin D 600-200 MG-UNIT TABS Take 1 tablet by mouth daily.    Clobetasol Prop Emollient Base 0.05 % emollient cream Apply 1 application topically 2 (two)  times daily.   DULoxetine (CYMBALTA) 60 MG capsule Take 1 capsule by mouth 2 (two) times daily.   etanercept (ENBREL) 50 MG/ML injection Inject 50 mg into the skin once a week.   fluocinonide (LIDEX) 0.05 % external solution Apply 1 application topically 2 (two) times daily as needed (skin irritation).    fluticasone (FLONASE) 50 MCG/ACT nasal spray 2 sprays   folic acid (FOLVITE) 1 MG tablet Take 1 tablet by mouth daily.   HYDROcodone-acetaminophen (NORCO) 7.5-325 MG tablet Take 1 tablet by mouth every 6 (six) hours as needed for  moderate pain or severe pain.   [START ON 03/17/2021] HYDROcodone-acetaminophen (NORCO) 7.5-325 MG tablet Take 1 tablet by mouth every 6 (six) hours as needed for moderate pain or severe pain.   meloxicam (MOBIC) 15 MG tablet Take 15 mg by mouth daily.   metoprolol succinate (TOPROL-XL) 50 MG 24 hr tablet Take 1 tablet (50 mg total) by mouth 2 (two) times daily. Take with or immediately following a meal.   Multiple Vitamin (MULTI-VITAMIN PO) Take 1 tablet by mouth daily.    nystatin (MYCOSTATIN/NYSTOP) powder 1 application   Omega-3 Fatty Acids (FISH OIL PO) Take 1,000 mg by mouth daily.    omeprazole (PRILOSEC) 40 MG capsule Take 1 capsule by mouth daily.   OSPHENA 60 MG TABS Take 1 tablet by mouth daily.   rosuvastatin (CRESTOR) 20 MG tablet Take 20 mg by mouth once a week.     Allergies:   Morphine, Advair diskus [fluticasone-salmeterol], Ambien [zolpidem tartrate], Amlodipine besylate, Azithromycin, Bextra [valdecoxib], Bupropion, Captopril, Irbesartan, Latex, Lisinopril, Pulmicort [budesonide], Savella [milnacipran hcl], Spiriva [tiotropium bromide monohydrate], Suvorexant, and Tiotropium bromide monohydrate   Social History   Socioeconomic History   Marital status: Married    Spouse name: Not on file   Number of children: 3   Years of education: Not on file   Highest education level: Not on file  Occupational History   Occupation: Retired    Fish farm manager: [employer]  Tobacco Use   Smoking status: Former    Packs/day: 2.00    Years: 40.00    Pack years: 80.00    Types: Cigarettes    Quit date: 02/24/1999    Years since quitting: 22.0   Smokeless tobacco: Never  Vaping Use   Vaping Use: Never used  Substance and Sexual Activity   Alcohol use: Not Currently    Comment:     Drug use: No   Sexual activity: Not on file  Other Topics Concern   Not on file  Social History Narrative   Lives with husband.  5 children together. (She has 3).     Social Determinants of Health   Financial  Resource Strain: Not on file  Food Insecurity: Not on file  Transportation Needs: Not on file  Physical Activity: Not on file  Stress: Not on file  Social Connections: Not on file     Family History: The patient's family history includes Allergies in her mother; Cancer in her father; Diabetes in her mother; Emphysema in her father; Heart attack (age of onset: 36) in her father; Heart attack (age of onset: 66) in her brother; Heart disease in her father; Hypertension in her mother.  ROS:   Please see the history of present illness.    All other systems reviewed and are negative.  EKGs/Labs/Other Studies Reviewed:    The following studies were reviewed today:   EKG:  The ekg ordered today demonstrates sinus rhythm, left anterior fascicular block,  incomplete right bundle branch block, no PVCs  Recent Labs: 12/15/2020: Hemoglobin 14.3; Platelets 170 12/16/2020: Magnesium 1.9 12/17/2020: BUN 18; Creatinine, Ser 0.72; Potassium 4.9; Sodium 132 01/06/2021: ALT 23; TSH 2.110  Recent Lipid Panel    Component Value Date/Time   CHOL 113 12/11/2020 2132   TRIG 127 12/11/2020 2132   HDL 49 12/11/2020 2132   CHOLHDL 2.3 12/11/2020 2132   VLDL 25 12/11/2020 2132   LDLCALC 39 12/11/2020 2132    Physical Exam:    VS:  BP 124/60    Pulse 62    Ht $R'5\' 7"'oe$  (1.702 m)    Wt 196 lb 6.4 oz (89.1 kg)    SpO2 92%    BMI 30.76 kg/m     Wt Readings from Last 3 Encounters:  03/04/21 196 lb 6.4 oz (89.1 kg)  02/04/21 180 lb (81.6 kg)  01/06/21 194 lb 12.8 oz (88.4 kg)     GEN:  Well nourished, well developed in no acute distress HEENT: Normal NECK: No JVD; No carotid bruits LYMPHATICS: No lymphadenopathy CARDIAC: RRR, no murmurs, rubs, gallops RESPIRATORY:  Clear to auscultation without rales, wheezing or rhonchi  ABDOMEN: Soft, non-tender, non-distended MUSCULOSKELETAL:  No edema; No deformity  SKIN: Warm and dry NEUROLOGIC:  Alert and oriented x 3 PSYCHIATRIC:  Normal affect         ASSESSMENT:    1. VT (ventricular tachycardia)   2. Amiodarone induced neuropathy (HCC)   3. Palpitations    PLAN:    In order of problems listed above:  #Ventricular tachycardia No recurrent episode off amiodarone.  I would not rechallenge with amiodarone given her associated neurologic side effects.  For now, continue metoprolol succinate but will decrease the dose to 12.5 mg by mouth in the morning and 25 mg by mouth in the evening to see if this helps with some of her daytime fatigue.  #History of amiodarone associated neuropathy Do not rechallenge with amiodarone  #Chronic fatigue I suspect polypharmacy and opioid use could be contributing to her fatigue.  I will decrease her metoprolol dose to see if that helps at all.     Follow-up with APP in 6 months.  Can likely follow-up with her primary cardiologist after that visit if her rhythm is stable.     Medication Adjustments/Labs and Tests Ordered: Current medicines are reviewed at length with the patient today.  Concerns regarding medicines are outlined above.  No orders of the defined types were placed in this encounter.  No orders of the defined types were placed in this encounter.    Signed, Lars Mage, MD, [facility], [facility] 03/04/2021 9:00 PM    Electrophysiology [facility]

## 2021-03-05 LAB — HEPATIC FUNCTION PANEL
ALT: 16 IU/L (ref 0–32)
AST: 21 IU/L (ref 0–40)
Albumin: 4.1 g/dL (ref 3.7–4.7)
Alkaline Phosphatase: 61 IU/L (ref 44–121)
Bilirubin Total: 0.5 mg/dL (ref 0.0–1.2)
Bilirubin, Direct: 0.18 mg/dL (ref 0.00–0.40)
Total Protein: 6.4 g/dL (ref 6.0–8.5)

## 2021-03-05 LAB — TSH: TSH: 3.32 u[IU]/mL (ref 0.450–4.500)

## 2021-03-11 DIAGNOSIS — F324 Major depressive disorder, single episode, in partial remission: Secondary | ICD-10-CM | POA: Diagnosis not present

## 2021-03-11 DIAGNOSIS — E785 Hyperlipidemia, unspecified: Secondary | ICD-10-CM | POA: Diagnosis not present

## 2021-03-11 DIAGNOSIS — M059 Rheumatoid arthritis with rheumatoid factor, unspecified: Secondary | ICD-10-CM | POA: Diagnosis not present

## 2021-03-11 DIAGNOSIS — E890 Postprocedural hypothyroidism: Secondary | ICD-10-CM | POA: Diagnosis not present

## 2021-03-11 DIAGNOSIS — J441 Chronic obstructive pulmonary disease with (acute) exacerbation: Secondary | ICD-10-CM | POA: Diagnosis not present

## 2021-03-11 DIAGNOSIS — I10 Essential (primary) hypertension: Secondary | ICD-10-CM | POA: Diagnosis not present

## 2021-03-11 DIAGNOSIS — K219 Gastro-esophageal reflux disease without esophagitis: Secondary | ICD-10-CM | POA: Diagnosis not present

## 2021-03-11 DIAGNOSIS — E1169 Type 2 diabetes mellitus with other specified complication: Secondary | ICD-10-CM | POA: Diagnosis not present

## 2021-03-11 DIAGNOSIS — M47816 Spondylosis without myelopathy or radiculopathy, lumbar region: Secondary | ICD-10-CM | POA: Diagnosis not present

## 2021-03-15 ENCOUNTER — Other Ambulatory Visit: Payer: Medicare HMO

## 2021-03-16 ENCOUNTER — Other Ambulatory Visit: Payer: Medicare HMO

## 2021-03-16 ENCOUNTER — Inpatient Hospital Stay: Admission: RE | Admit: 2021-03-16 | Payer: Medicare HMO | Source: Ambulatory Visit

## 2021-03-30 ENCOUNTER — Other Ambulatory Visit: Payer: Self-pay

## 2021-03-30 ENCOUNTER — Ambulatory Visit
Admission: RE | Admit: 2021-03-30 | Discharge: 2021-03-30 | Disposition: A | Discharge status: Home / Self Care | Payer: Medicare HMO | Source: Ambulatory Visit | Attending: Physician Assistant | Admitting: Physician Assistant

## 2021-03-30 DIAGNOSIS — M4802 Spinal stenosis, cervical region: Secondary | ICD-10-CM | POA: Diagnosis not present

## 2021-03-30 DIAGNOSIS — M2578 Osteophyte, vertebrae: Secondary | ICD-10-CM | POA: Diagnosis not present

## 2021-03-30 DIAGNOSIS — I639 Cerebral infarction, unspecified: Secondary | ICD-10-CM | POA: Diagnosis not present

## 2021-03-30 DIAGNOSIS — I6782 Cerebral ischemia: Secondary | ICD-10-CM | POA: Diagnosis not present

## 2021-03-30 DIAGNOSIS — G319 Degenerative disease of nervous system, unspecified: Secondary | ICD-10-CM | POA: Diagnosis not present

## 2021-03-30 DIAGNOSIS — R2681 Unsteadiness on feet: Secondary | ICD-10-CM

## 2021-03-31 DIAGNOSIS — M069 Rheumatoid arthritis, unspecified: Secondary | ICD-10-CM | POA: Diagnosis not present

## 2021-03-31 DIAGNOSIS — M25561 Pain in right knee: Secondary | ICD-10-CM | POA: Diagnosis not present

## 2021-03-31 DIAGNOSIS — M1990 Unspecified osteoarthritis, unspecified site: Secondary | ICD-10-CM | POA: Diagnosis not present

## 2021-03-31 DIAGNOSIS — J449 Chronic obstructive pulmonary disease, unspecified: Secondary | ICD-10-CM | POA: Diagnosis not present

## 2021-03-31 DIAGNOSIS — M25569 Pain in unspecified knee: Secondary | ICD-10-CM | POA: Diagnosis not present

## 2021-03-31 DIAGNOSIS — Z79899 Other long term (current) drug therapy: Secondary | ICD-10-CM | POA: Diagnosis not present

## 2021-03-31 DIAGNOSIS — R5383 Other fatigue: Secondary | ICD-10-CM | POA: Diagnosis not present

## 2021-03-31 DIAGNOSIS — I10 Essential (primary) hypertension: Secondary | ICD-10-CM | POA: Diagnosis not present

## 2021-03-31 DIAGNOSIS — M25562 Pain in left knee: Secondary | ICD-10-CM | POA: Diagnosis not present

## 2021-04-02 ENCOUNTER — Other Ambulatory Visit: Payer: Self-pay

## 2021-04-02 ENCOUNTER — Encounter: Payer: Self-pay | Admitting: Anesthesiology

## 2021-04-02 ENCOUNTER — Ambulatory Visit: Payer: Medicare HMO | Attending: Anesthesiology | Admitting: Anesthesiology

## 2021-04-02 DIAGNOSIS — M25551 Pain in right hip: Secondary | ICD-10-CM

## 2021-04-02 DIAGNOSIS — M4716 Other spondylosis with myelopathy, lumbar region: Secondary | ICD-10-CM | POA: Diagnosis not present

## 2021-04-02 DIAGNOSIS — M5431 Sciatica, right side: Secondary | ICD-10-CM

## 2021-04-02 DIAGNOSIS — M0579 Rheumatoid arthritis with rheumatoid factor of multiple sites without organ or systems involvement: Secondary | ICD-10-CM | POA: Diagnosis not present

## 2021-04-02 DIAGNOSIS — M5432 Sciatica, left side: Secondary | ICD-10-CM

## 2021-04-02 DIAGNOSIS — M79642 Pain in left hand: Secondary | ICD-10-CM

## 2021-04-02 DIAGNOSIS — M5136 Other intervertebral disc degeneration, lumbar region: Secondary | ICD-10-CM

## 2021-04-02 DIAGNOSIS — G894 Chronic pain syndrome: Secondary | ICD-10-CM | POA: Diagnosis not present

## 2021-04-02 DIAGNOSIS — F1190 Opioid use, unspecified, uncomplicated: Secondary | ICD-10-CM

## 2021-04-02 DIAGNOSIS — M51369 Other intervertebral disc degeneration, lumbar region without mention of lumbar back pain or lower extremity pain: Secondary | ICD-10-CM

## 2021-04-02 DIAGNOSIS — M25552 Pain in left hip: Secondary | ICD-10-CM

## 2021-04-02 DIAGNOSIS — M797 Fibromyalgia: Secondary | ICD-10-CM | POA: Diagnosis not present

## 2021-04-02 DIAGNOSIS — M79641 Pain in right hand: Secondary | ICD-10-CM

## 2021-04-02 MED ORDER — HYDROCODONE-ACETAMINOPHEN 7.5-325 MG PO TABS: 1.0000 | ORAL_TABLET | Freq: Four times a day (QID) | ORAL | 0 refills | Status: DC | PRN

## 2021-04-02 MED ORDER — HYDROCODONE-ACETAMINOPHEN 7.5-325 MG PO TABS: 1.0000 | ORAL_TABLET | ORAL | 0 refills | Status: AC | PRN

## 2021-04-02 NOTE — Progress Notes (Signed)
Virtual Visit via Telephone Note  I connected with Kelsey Diaz on 04/02/21 at  1:40 PM EST by telephone and verified that I am speaking with the correct person using two identifiers.  Location: Patient: Home Provider: Pain control center   I discussed the limitations, risks, security and privacy concerns of performing an evaluation and management service by telephone and the availability of in person appointments. I also discussed with the patient that there may be a patient responsible charge related to this service. The patient expressed understanding and agreed to proceed.   History of Present Illness: I spoke with Kelsey Diaz today via telephone as she was unable to do the video portion of the virtual conference.  She had a recent epidural back in December and feels that she made good progress with her back pain but she feels like she is hurting everywhere.  She does have a history of fibromyalgia and does have diffuse osteoarthritis and joint pain.  The pain in her low back legs and shoulders and arms is comparable to what she is experienced chronically.  She takes her 7.5 mg hydrocodone 4 times a day and reports about 50% improvement lasting about 4 hours before she has recurrence of the same pain.  She also was hospitalized for some cardiac issues recently which have been problematic for her in regards to her activity and she attributes this inactivity to exacerbating some of her pain.  No change in quality characteristic or distribution is otherwise noted.  She feels that she does make good response to the hydrocodone.  Review of systems: General: No fevers or chills Pulmonary: No shortness of breath or dyspnea Cardiac: No angina or palpitations or lightheadedness GI: No abdominal pain or constipation Psych: No depression    Observations/Objective:   Current Outpatient Medications:    [START ON 05/21/2021] HYDROcodone-acetaminophen (NORCO) 7.5-325 MG tablet, Take 1 tablet by mouth  every 6 (six) hours as needed for moderate pain or severe pain., Disp: 120 tablet, Rfl: 0   ACCU-CHEK GUIDE test strip, , Disp: , Rfl:    Accu-Chek Softclix Lancets lancets, , Disp: , Rfl:    albuterol (VENTOLIN HFA) 108 (90 Base) MCG/ACT inhaler, Inhale 2 puffs into the lungs every 6 (six) hours as needed for shortness of breath. , Disp: , Rfl:    aspirin 81 MG chewable tablet, 1 tablet, Disp: , Rfl:    Blood Glucose Monitoring Suppl (ACCU-CHEK GUIDE ME) w/Device KIT, , Disp: , Rfl:    Calcium Carbonate-Vitamin D 600-200 MG-UNIT TABS, Take 1 tablet by mouth daily. , Disp: , Rfl:    Clobetasol Prop Emollient Base 0.05 % emollient cream, Apply 1 application topically 2 (two) times daily., Disp: , Rfl:    DULoxetine (CYMBALTA) 60 MG capsule, Take 120 mg by mouth daily.  (Patient not taking: Reported on 02/04/2021), Disp: , Rfl:    DULoxetine (CYMBALTA) 60 MG capsule, Take 1 capsule by mouth 2 (two) times daily., Disp: , Rfl:    etanercept (ENBREL) 50 MG/ML injection, Inject 50 mg into the skin once a week., Disp: , Rfl:    fluocinonide (LIDEX) 0.05 % external solution, Apply 1 application topically 2 (two) times daily as needed (skin irritation). , Disp: , Rfl:    fluticasone (FLONASE) 50 MCG/ACT nasal spray, 2 sprays, Disp: , Rfl:    folic acid (FOLVITE) 1 MG tablet, Take 1 tablet by mouth daily., Disp: , Rfl:    [START ON 04/21/2021] HYDROcodone-acetaminophen (NORCO) 7.5-325 MG tablet, Take 1 tablet  by mouth every 4 (four) hours as needed for moderate pain or severe pain., Disp: 150 tablet, Rfl: 0   LORazepam (ATIVAN) 0.5 MG tablet, Take 0.5 mg by mouth daily as needed for anxiety. (Patient not taking: Reported on 01/06/2021), Disp: , Rfl:    meloxicam (MOBIC) 15 MG tablet, Take 15 mg by mouth daily., Disp: , Rfl:    meloxicam (MOBIC) 15 MG tablet, Take 1 tablet by mouth daily. (Patient not taking: Reported on 02/04/2021), Disp: , Rfl:    methotrexate 2.5 MG tablet, Take 20 mg by mouth once a week.  (Patient not taking: Reported on 01/06/2021), Disp: , Rfl:    metoprolol succinate (TOPROL-XL) 50 MG 24 hr tablet, Take 1 tablet (50 mg total) by mouth 2 (two) times daily. Take with or immediately following a meal., Disp: 60 tablet, Rfl: 6   Multiple Vitamin (MULTI-VITAMIN PO), Take 1 tablet by mouth daily. , Disp: , Rfl:    nystatin (MYCOSTATIN/NYSTOP) powder, 1 application, Disp: , Rfl:    Omega-3 Fatty Acids (FISH OIL PO), Take 1,000 mg by mouth daily. , Disp: , Rfl:    omeprazole (PRILOSEC) 40 MG capsule, Take 1 capsule by mouth daily., Disp: , Rfl: 1   omeprazole (PRILOSEC) 40 MG capsule, 1 tablet (Patient not taking: Reported on 02/04/2021), Disp: , Rfl:    OSPHENA 60 MG TABS, Take 1 tablet by mouth daily., Disp: , Rfl:    rosuvastatin (CRESTOR) 20 MG tablet, Take 20 mg by mouth once a week., Disp: , Rfl:    Suvorexant (BELSOMRA) 10 MG TABS, Take 1 tablet by mouth at bedtime as needed. (Patient not taking: Reported on 01/06/2021), Disp: , Rfl:    Past Medical History:  Diagnosis Date   Allergic rhinitis    Anxiety    Asthma    Bilateral hip pain 11/28/2018   Colitis    COPD (chronic obstructive pulmonary disease) ([place])    Fibromyalgia 05/16/2019   GERD (gastroesophageal reflux disease)    With esophageal strictures   Hypertension    Hyperthyroidism    OSA (obstructive sleep apnea) 02/09/2012   No mask.  Did not tolerate   Osteoarthritis    Rheumatoid arthritis ([place]) 10/03/2018    Assessment and Plan: 1. DDD (degenerative disc disease), lumbar   2. Bilateral sciatica   3. Bilateral hip pain   4. Lumbar spondylosis with myelopathy   5. Chronic, continuous use of opioids   6. Fibromyalgia   7. Chronic pain syndrome   8. Rheumatoid arthritis involving multiple sites with positive rheumatoid factor ([place])   9. Pain in both hands   Based on her discussion today I think it is appropriate to refill her medicines for the next 2 months.  She is asked to increase her dosing regimen for  the hydrocodone.  I am going to move her to every 4 hour interval and allow her up to 5 times a day dosing for the next month for 150 tablets but keep the strength at the 7.5 mg range.  We will reassess in 2 months and hopefully with the warmer weather and increased activity her pain will be under better control.  She feels that the sciatica has responded favorably but the low back pain and diffuse body pain is problematic.  She is taking Mobic at the 15 mg strength already.  I encouraged her to continue with stretching strengthening exercises and ambulate as tolerated with continue follow-up with her primary care physicians.  She is to return to  clinic in 2 months.    Follow Up Instructions:    I discussed the assessment and treatment plan with the patient. The patient was provided an opportunity to ask questions and all were answered. The patient agreed with the plan and demonstrated an understanding of the instructions.   The patient was advised to call back or seek an in-person evaluation if the symptoms worsen or if the condition fails to improve as anticipated.  I provided 30 minutes of non-face-to-face time during this encounter.   Molli Barrows, MD Home

## 2021-04-04 ENCOUNTER — Inpatient Hospital Stay (HOSPITAL_COMMUNITY)
Admission: EM | Admit: 2021-04-04 | Discharge: 2021-04-07 | DRG: 872 | Disposition: A | Discharge status: Home / Self Care | Payer: Medicare HMO | Attending: Internal Medicine | Admitting: Internal Medicine

## 2021-04-04 ENCOUNTER — Emergency Department (HOSPITAL_COMMUNITY): Payer: Medicare HMO

## 2021-04-04 ENCOUNTER — Encounter (HOSPITAL_COMMUNITY): Payer: Self-pay

## 2021-04-04 ENCOUNTER — Other Ambulatory Visit: Payer: Self-pay

## 2021-04-04 DIAGNOSIS — I472 Ventricular tachycardia, unspecified: Secondary | ICD-10-CM | POA: Diagnosis not present

## 2021-04-04 DIAGNOSIS — A419 Sepsis, unspecified organism: Secondary | ICD-10-CM | POA: Diagnosis not present

## 2021-04-04 DIAGNOSIS — N3000 Acute cystitis without hematuria: Secondary | ICD-10-CM | POA: Diagnosis not present

## 2021-04-04 DIAGNOSIS — G4733 Obstructive sleep apnea (adult) (pediatric): Secondary | ICD-10-CM | POA: Diagnosis present

## 2021-04-04 DIAGNOSIS — B9620 Unspecified Escherichia coli [E. coli] as the cause of diseases classified elsewhere: Secondary | ICD-10-CM | POA: Diagnosis not present

## 2021-04-04 DIAGNOSIS — M069 Rheumatoid arthritis, unspecified: Secondary | ICD-10-CM | POA: Diagnosis not present

## 2021-04-04 DIAGNOSIS — Z885 Allergy status to narcotic agent status: Secondary | ICD-10-CM

## 2021-04-04 DIAGNOSIS — Z888 Allergy status to other drugs, medicaments and biological substances status: Secondary | ICD-10-CM

## 2021-04-04 DIAGNOSIS — Z833 Family history of diabetes mellitus: Secondary | ICD-10-CM

## 2021-04-04 DIAGNOSIS — Z8051 Family history of malignant neoplasm of kidney: Secondary | ICD-10-CM | POA: Diagnosis not present

## 2021-04-04 DIAGNOSIS — N390 Urinary tract infection, site not specified: Secondary | ICD-10-CM

## 2021-04-04 DIAGNOSIS — R509 Fever, unspecified: Secondary | ICD-10-CM | POA: Diagnosis not present

## 2021-04-04 DIAGNOSIS — Z87891 Personal history of nicotine dependence: Secondary | ICD-10-CM | POA: Diagnosis not present

## 2021-04-04 DIAGNOSIS — Z7982 Long term (current) use of aspirin: Secondary | ICD-10-CM | POA: Diagnosis not present

## 2021-04-04 DIAGNOSIS — R531 Weakness: Secondary | ICD-10-CM | POA: Diagnosis not present

## 2021-04-04 DIAGNOSIS — J449 Chronic obstructive pulmonary disease, unspecified: Secondary | ICD-10-CM | POA: Diagnosis not present

## 2021-04-04 DIAGNOSIS — G4489 Other headache syndrome: Secondary | ICD-10-CM | POA: Diagnosis not present

## 2021-04-04 DIAGNOSIS — E871 Hypo-osmolality and hyponatremia: Secondary | ICD-10-CM | POA: Diagnosis not present

## 2021-04-04 DIAGNOSIS — K219 Gastro-esophageal reflux disease without esophagitis: Secondary | ICD-10-CM | POA: Diagnosis present

## 2021-04-04 DIAGNOSIS — R319 Hematuria, unspecified: Secondary | ICD-10-CM | POA: Diagnosis not present

## 2021-04-04 DIAGNOSIS — M797 Fibromyalgia: Secondary | ICD-10-CM | POA: Diagnosis present

## 2021-04-04 DIAGNOSIS — I10 Essential (primary) hypertension: Secondary | ICD-10-CM | POA: Diagnosis not present

## 2021-04-04 DIAGNOSIS — Z20822 Contact with and (suspected) exposure to covid-19: Secondary | ICD-10-CM | POA: Diagnosis not present

## 2021-04-04 DIAGNOSIS — Z79899 Other long term (current) drug therapy: Secondary | ICD-10-CM | POA: Diagnosis not present

## 2021-04-04 DIAGNOSIS — G9341 Metabolic encephalopathy: Secondary | ICD-10-CM | POA: Diagnosis not present

## 2021-04-04 DIAGNOSIS — R9431 Abnormal electrocardiogram [ECG] [EKG]: Secondary | ICD-10-CM | POA: Diagnosis not present

## 2021-04-04 DIAGNOSIS — Z825 Family history of asthma and other chronic lower respiratory diseases: Secondary | ICD-10-CM

## 2021-04-04 DIAGNOSIS — R739 Hyperglycemia, unspecified: Secondary | ICD-10-CM | POA: Diagnosis present

## 2021-04-04 DIAGNOSIS — Z881 Allergy status to other antibiotic agents status: Secondary | ICD-10-CM

## 2021-04-04 DIAGNOSIS — Z91040 Latex allergy status: Secondary | ICD-10-CM

## 2021-04-04 DIAGNOSIS — Z87440 Personal history of urinary (tract) infections: Secondary | ICD-10-CM | POA: Diagnosis not present

## 2021-04-04 DIAGNOSIS — R7881 Bacteremia: Secondary | ICD-10-CM

## 2021-04-04 DIAGNOSIS — R6520 Severe sepsis without septic shock: Secondary | ICD-10-CM | POA: Diagnosis not present

## 2021-04-04 DIAGNOSIS — E876 Hypokalemia: Secondary | ICD-10-CM | POA: Diagnosis not present

## 2021-04-04 DIAGNOSIS — Z791 Long term (current) use of non-steroidal anti-inflammatories (NSAID): Secondary | ICD-10-CM

## 2021-04-04 DIAGNOSIS — Z8249 Family history of ischemic heart disease and other diseases of the circulatory system: Secondary | ICD-10-CM | POA: Diagnosis not present

## 2021-04-04 DIAGNOSIS — A4151 Sepsis due to Escherichia coli [E. coli]: Principal | ICD-10-CM

## 2021-04-04 DIAGNOSIS — I700 Atherosclerosis of aorta: Secondary | ICD-10-CM | POA: Diagnosis not present

## 2021-04-04 HISTORY — DX: Unspecified Escherichia coli (E. coli) as the cause of diseases classified elsewhere: B96.20

## 2021-04-04 HISTORY — DX: Urinary tract infection, site not specified: N39.0

## 2021-04-04 LAB — URINALYSIS, ROUTINE W REFLEX MICROSCOPIC
Bilirubin Urine: NEGATIVE
Glucose, UA: NEGATIVE mg/dL
Hgb urine dipstick: NEGATIVE
Ketones, ur: NEGATIVE mg/dL
Nitrite: NEGATIVE
Protein, ur: NEGATIVE mg/dL
Specific Gravity, Urine: 1.014 (ref 1.005–1.030)
pH: 7 (ref 5.0–8.0)

## 2021-04-04 LAB — BLOOD CULTURE ID PANEL (REFLEXED) - BCID2

## 2021-04-04 LAB — MAGNESIUM: Magnesium: 1.7 mg/dL (ref 1.7–2.4)

## 2021-04-04 LAB — COMPREHENSIVE METABOLIC PANEL
ALT: 14 U/L (ref 0–44)
AST: 16 U/L (ref 15–41)
Albumin: 3.1 g/dL — ABNORMAL LOW (ref 3.5–5.0)
Alkaline Phosphatase: 41 U/L (ref 38–126)
Anion gap: 9 (ref 5–15)
BUN: 16 mg/dL (ref 8–23)
CO2: 23 mmol/L (ref 22–32)
Calcium: 8.5 mg/dL — ABNORMAL LOW (ref 8.9–10.3)
Chloride: 100 mmol/L (ref 98–111)
Creatinine, Ser: 0.59 mg/dL (ref 0.44–1.00)
GFR, Estimated: >60 mL/min (ref >60)
Glucose, Bld: 150 mg/dL — ABNORMAL HIGH (ref 70–99)
Potassium: 3.2 mmol/L — ABNORMAL LOW (ref 3.5–5.1)
Sodium: 132 mmol/L — ABNORMAL LOW (ref 135–145)
Total Bilirubin: 1 mg/dL (ref 0.3–1.2)
Total Protein: 6.5 g/dL (ref 6.5–8.1)

## 2021-04-04 LAB — CBC WITH DIFFERENTIAL/PLATELET
Abs Immature Granulocytes: 0.04 10*3/uL (ref 0.00–0.07)
Basophils Absolute: 0 10*3/uL (ref 0.0–0.1)
Basophils Relative: 0 %
Eosinophils Absolute: 0 10*3/uL (ref 0.0–0.5)
Eosinophils Relative: 0 %
HCT: 38.2 % (ref 36.0–46.0)
Hemoglobin: 13 g/dL (ref 12.0–15.0)
Immature Granulocytes: 1 %
Lymphocytes Relative: 12 %
Lymphs Abs: 1.1 10*3/uL (ref 0.7–4.0)
MCH: 31.1 pg (ref 26.0–34.0)
MCHC: 34 g/dL (ref 30.0–36.0)
MCV: 91.4 fL (ref 80.0–100.0)
Monocytes Absolute: 0.8 10*3/uL (ref 0.1–1.0)
Monocytes Relative: 10 %
Neutro Abs: 6.6 10*3/uL (ref 1.7–7.7)
Neutrophils Relative %: 77 %
Platelets: 149 10*3/uL — ABNORMAL LOW (ref 150–400)
RBC: 4.18 MIL/uL (ref 3.87–5.11)
RDW: 12.8 % (ref 11.5–15.5)
WBC: 8.6 10*3/uL (ref 4.0–10.5)
nRBC: 0 % (ref 0.0–0.2)

## 2021-04-04 LAB — APTT: aPTT: 30 seconds (ref 24–36)

## 2021-04-04 LAB — LACTIC ACID, PLASMA
Lactic Acid, Venous: 0.9 mmol/L (ref 0.5–1.9)
Lactic Acid, Venous: 1.4 mmol/L (ref 0.5–1.9)

## 2021-04-04 LAB — RESP PANEL BY RT-PCR (FLU A&B, COVID) ARPGX2
Influenza A by PCR: NEGATIVE
Influenza B by PCR: NEGATIVE
SARS Coronavirus 2 by RT PCR: NEGATIVE

## 2021-04-04 LAB — PHOSPHORUS: Phosphorus: 2.4 mg/dL — ABNORMAL LOW (ref 2.5–4.6)

## 2021-04-04 LAB — PROTIME-INR
INR: 1 (ref 0.8–1.2)
Prothrombin Time: 13.4 seconds (ref 11.4–15.2)

## 2021-04-04 MED ORDER — ALBUTEROL SULFATE (2.5 MG/3ML) 0.083% IN NEBU: 2.5000 mg | INHALATION_SOLUTION | Freq: Four times a day (QID) | RESPIRATORY_TRACT | Status: DC | PRN

## 2021-04-04 MED ORDER — ONDANSETRON HCL 4 MG PO TABS: 4.0000 mg | ORAL_TABLET | Freq: Four times a day (QID) | ORAL | Status: DC | PRN

## 2021-04-04 MED ORDER — SODIUM CHLORIDE 0.9 % IV SOLN
1000.0000 mL | INTRAVENOUS | Status: DC
  Administered 2021-04-04 – 2021-04-06 (×5): 1000 mL via INTRAVENOUS

## 2021-04-04 MED ORDER — HYDROCORTISONE 0.5 % EX CREA
TOPICAL_CREAM | Freq: Two times a day (BID) | CUTANEOUS | Status: DC
  Filled 2021-04-04: qty 28.35

## 2021-04-04 MED ORDER — GUAIFENESIN-DM 100-10 MG/5ML PO SYRP
10.0000 mL | ORAL_SOLUTION | ORAL | Status: DC | PRN
  Administered 2021-04-04: 10 mL via ORAL
  Filled 2021-04-04: qty 10

## 2021-04-04 MED ORDER — POTASSIUM CHLORIDE CRYS ER 20 MEQ PO TBCR
40.0000 meq | EXTENDED_RELEASE_TABLET | ORAL | Status: AC
  Administered 2021-04-04: 40 meq via ORAL
  Filled 2021-04-04: qty 2

## 2021-04-04 MED ORDER — MELOXICAM 7.5 MG PO TABS
15.0000 mg | ORAL_TABLET | Freq: Every day | ORAL | Status: DC
Start: 2021-04-04 — End: 2021-04-07
  Administered 2021-04-04 – 2021-04-07 (×4): 15 mg via ORAL
  Filled 2021-04-04 (×4): qty 2

## 2021-04-04 MED ORDER — HYDROCORTISONE 0.5 % EX OINT
TOPICAL_OINTMENT | Freq: Two times a day (BID) | CUTANEOUS | Status: DC
  Filled 2021-04-04: qty 28.35

## 2021-04-04 MED ORDER — ASPIRIN 81 MG PO CHEW
81.0000 mg | CHEWABLE_TABLET | Freq: Every day | ORAL | Status: DC
Start: 2021-04-04 — End: 2021-04-07
  Administered 2021-04-04 – 2021-04-07 (×4): 81 mg via ORAL
  Filled 2021-04-04 (×4): qty 1

## 2021-04-04 MED ORDER — SODIUM CHLORIDE 0.9 % IV SOLN: 1.0000 g | INTRAVENOUS | Status: DC

## 2021-04-04 MED ORDER — OYSTER SHELL CALCIUM/D3 500-5 MG-MCG PO TABS
1.0000 | ORAL_TABLET | Freq: Every day | ORAL | Status: DC
  Administered 2021-04-04 – 2021-04-07 (×4): 1 via ORAL
  Filled 2021-04-04 (×4): qty 1

## 2021-04-04 MED ORDER — ENOXAPARIN SODIUM 40 MG/0.4ML IJ SOSY
40.0000 mg | PREFILLED_SYRINGE | INTRAMUSCULAR | Status: DC
  Administered 2021-04-04 – 2021-04-06 (×3): 40 mg via SUBCUTANEOUS
  Filled 2021-04-04 (×3): qty 0.4

## 2021-04-04 MED ORDER — POTASSIUM CHLORIDE CRYS ER 20 MEQ PO TBCR: 40.0000 meq | EXTENDED_RELEASE_TABLET | Freq: Once | ORAL | Status: DC

## 2021-04-04 MED ORDER — SODIUM CHLORIDE 0.9 % IV SOLN
1.0000 g | Freq: Once | INTRAVENOUS | Status: AC
  Administered 2021-04-04: 1 g via INTRAVENOUS
  Filled 2021-04-04: qty 10

## 2021-04-04 MED ORDER — ACETAMINOPHEN 650 MG RE SUPP: 650.0000 mg | Freq: Four times a day (QID) | RECTAL | Status: DC | PRN

## 2021-04-04 MED ORDER — CLOBETASOL PROPIONATE 0.05 % EX CREA
1.0000 "application " | TOPICAL_CREAM | Freq: Two times a day (BID) | CUTANEOUS | Status: DC
  Administered 2021-04-04: 1 via TOPICAL
  Filled 2021-04-04: qty 15

## 2021-04-04 MED ORDER — DULOXETINE HCL 60 MG PO CPEP
60.0000 mg | ORAL_CAPSULE | Freq: Two times a day (BID) | ORAL | Status: DC
  Administered 2021-04-04 – 2021-04-07 (×7): 60 mg via ORAL
  Filled 2021-04-04 (×7): qty 1

## 2021-04-04 MED ORDER — METOPROLOL SUCCINATE ER 25 MG PO TB24
12.5000 mg | ORAL_TABLET | Freq: Two times a day (BID) | ORAL | Status: DC
  Administered 2021-04-04 – 2021-04-07 (×7): 12.5 mg via ORAL
  Filled 2021-04-04 (×7): qty 1

## 2021-04-04 MED ORDER — RISAQUAD PO CAPS
2.0000 | ORAL_CAPSULE | Freq: Three times a day (TID) | ORAL | Status: DC
  Administered 2021-04-04 – 2021-04-07 (×8): 2 via ORAL
  Filled 2021-04-04 (×8): qty 2

## 2021-04-04 MED ORDER — FLUTICASONE PROPIONATE 50 MCG/ACT NA SUSP: 2.0000 | Freq: Every day | NASAL | Status: DC | PRN

## 2021-04-04 MED ORDER — HYDROCODONE-ACETAMINOPHEN 7.5-325 MG PO TABS
1.0000 | ORAL_TABLET | ORAL | Status: DC | PRN
  Administered 2021-04-04 – 2021-04-07 (×8): 1 via ORAL
  Filled 2021-04-04 (×8): qty 1

## 2021-04-04 MED ORDER — SODIUM CHLORIDE 0.9 % IV SOLN
1.0000 g | Freq: Once | INTRAVENOUS | Status: AC
  Administered 2021-04-05: 1 g via INTRAVENOUS
  Filled 2021-04-04: qty 10

## 2021-04-04 MED ORDER — ACETAMINOPHEN 325 MG PO TABS
650.0000 mg | ORAL_TABLET | Freq: Four times a day (QID) | ORAL | Status: DC | PRN
  Administered 2021-04-04: 650 mg via ORAL
  Filled 2021-04-04: qty 2

## 2021-04-04 MED ORDER — SODIUM CHLORIDE 0.9 % IV BOLUS (SEPSIS)
1000.0000 mL | Freq: Once | INTRAVENOUS | Status: AC
Start: 2021-04-04 — End: 2021-04-04
  Administered 2021-04-04: 1000 mL via INTRAVENOUS

## 2021-04-04 MED ORDER — FOLIC ACID 1 MG PO TABS
1.0000 mg | ORAL_TABLET | Freq: Every day | ORAL | Status: DC
  Administered 2021-04-04 – 2021-04-07 (×4): 1 mg via ORAL
  Filled 2021-04-04 (×4): qty 1

## 2021-04-04 MED ORDER — ACETAMINOPHEN 325 MG PO TABS
650.0000 mg | ORAL_TABLET | Freq: Four times a day (QID) | ORAL | Status: DC | PRN
  Administered 2021-04-06 – 2021-04-07 (×2): 650 mg via ORAL
  Filled 2021-04-04 (×3): qty 2

## 2021-04-04 MED ORDER — ONDANSETRON HCL 4 MG/2ML IJ SOLN: 4.0000 mg | Freq: Four times a day (QID) | INTRAMUSCULAR | Status: DC | PRN

## 2021-04-04 MED ORDER — OSPEMIFENE 60 MG PO TABS
1.0000 | ORAL_TABLET | Freq: Every day | ORAL | Status: DC
Start: 2021-04-04 — End: 2021-04-06

## 2021-04-04 MED ORDER — SODIUM CHLORIDE 0.9 % IV SOLN
2.0000 g | INTRAVENOUS | Status: DC
  Administered 2021-04-05 – 2021-04-06 (×2): 2 g via INTRAVENOUS
  Filled 2021-04-04 (×2): qty 20

## 2021-04-04 MED ORDER — SODIUM CHLORIDE 0.9 % IV BOLUS (SEPSIS)
1000.0000 mL | Freq: Once | INTRAVENOUS | Status: AC
  Administered 2021-04-04: 1000 mL via INTRAVENOUS

## 2021-04-04 MED ORDER — HYDROCODONE-ACETAMINOPHEN 5-325 MG PO TABS
1.0000 | ORAL_TABLET | Freq: Once | ORAL | Status: AC
  Administered 2021-04-04: 1 via ORAL
  Filled 2021-04-04: qty 1

## 2021-04-04 MED ORDER — PANTOPRAZOLE SODIUM 40 MG PO TBEC
40.0000 mg | DELAYED_RELEASE_TABLET | Freq: Every day | ORAL | Status: DC
  Administered 2021-04-04 – 2021-04-07 (×4): 40 mg via ORAL
  Filled 2021-04-04 (×4): qty 1

## 2021-04-04 NOTE — H&P (Signed)
History and Physical    KENYANA HUSAK VFI:[id number] DOB: 04-Jul-1945 DOA: 04/04/2021  PCP: Harlan Stains, MD (Confirm with patient/family/NH records and if not entered, this has to be entered at [facility] point of entry) Patient coming from: Home  I have personally briefly reviewed patient's old medical records in [place]  Chief Complaint: urine frequency, abd pain and fever.  HPI: Kelsey Diaz is a 76 y.o. female with medical history significant of RA on Etanercept, PVCs/VT s/p ablation, COPD, HTN, frequent UTIs, anxiety/depression, OSA not tolerated CPAP, came with urinary frequency, dysuria suprapubic abdominal pain and fever.  Patient developed UTI symptoms for about 1 week.  Symptoms including urinary frequency, dysuria, strong smell of urine, suprapubic abdominal pain, subjective fever and chills.  Increasingly, she has been feeling generalized weakness, she denies any one-sided weakness or numbness.  Today, she could barely get up by herself.  She had similar symptoms about 6 months ago was diagnosed with UTI.  She also reports that she has a tendency to develop vaginal yeast infection while on ABX for UTI in the past.  She also complained about feeling nausea no vomiting.  No diarrhea.  No cough.  ED Course: spiked fever of 101.2, no tachycardia no hypotension.  Lactic acid 0.9, WBC 8.6, UA showed pyuria.  1 dose of Rocephin given in the ED.  Review of Systems: As per HPI otherwise 14 point review of systems negative.    Past Medical History:  Diagnosis Date   Allergic rhinitis    Anxiety    Asthma    Bilateral hip pain 11/28/2018   Colitis    COPD (chronic obstructive pulmonary disease) ([place])    Fibromyalgia 05/16/2019   GERD (gastroesophageal reflux disease)    With esophageal strictures   Hypertension    Hyperthyroidism    OSA (obstructive sleep apnea) 02/09/2012   No mask.  Did not tolerate   Osteoarthritis    Rheumatoid arthritis ([place]) 10/03/2018     Past Surgical History:  Procedure Laterality Date   BREAST BIOPSY     x2   CATARACT EXTRACTION W/PHACO Left 09/27/2018   Procedure: CATARACT EXTRACTION PHACO AND INTRAOCULAR LENS PLACEMENT ([place]) LEFT;  Surgeon: Birder Robson, MD;  Location: [place];  Service: Ophthalmology;  Laterality: Left;   CATARACT EXTRACTION W/PHACO Right 11/29/2018   Procedure: CATARACT EXTRACTION PHACO AND INTRAOCULAR LENS PLACEMENT ([place]) RIGHT;  Surgeon: Birder Robson, MD;  Location: [facility];  Service: Ophthalmology;  Laterality: Right;  1:22 20.1% 16.64   CHOLECYSTECTOMY     2005   KNEE SURGERY Right 2013   two torn ligaments repaired.    LEFT HEART CATH AND CORONARY ANGIOGRAPHY N/A 06/08/2017   Procedure: LEFT HEART CATH AND CORONARY ANGIOGRAPHY;  Surgeon: Troy Sine, MD;  Location: [facility];  Service: Cardiovascular;  Laterality: N/A;   LEFT HEART CATH AND CORONARY ANGIOGRAPHY N/A 12/12/2020   Procedure: LEFT HEART CATH AND CORONARY ANGIOGRAPHY;  Surgeon: Lorretta Harp, MD;  Location: [place] [place] [facility];  Service: Cardiovascular;  Laterality: N/A;   NASAL SINUS SURGERY     V TACH ABLATION N/A 12/16/2020   Procedure: V TACH ABLATION;  Surgeon: Vickie Epley, MD;  Location: [place] [facility];  Service: Cardiovascular;  Laterality: N/A;   VAGINAL HYSTERECTOMY       reports that she quit smoking about 22 years ago. Her smoking use included cigarettes. She has a 80.00 pack-year smoking history. She has never used smokeless  tobacco. She reports that she does not currently use alcohol. She reports that she does not use drugs.  Allergies  Allergen Reactions   Morphine Other (See Comments)    Stopped heart   Advair Diskus [Fluticasone-Salmeterol]     Sensitivity to smells   Ambien [Zolpidem Tartrate]     irritable   Amlodipine Besylate     Feels bad   Azithromycin     Other reaction(s): Unknown   Bextra [Valdecoxib]     Stomach issues   Bupropion      Other reaction(s): involuntary muscle movements   Captopril     Ache   Irbesartan     Other reaction(s): dizzy   Latex Itching    Bandaids only   Lisinopril     cough   Pacerone [Amiodarone] Other (See Comments)    falls   Pulmicort [Budesonide]     Worse breathing, sensitivity to smells   Savella [Milnacipran Hcl]     Mental fog   Spiriva [Tiotropium Bromide Monohydrate]     Dry mouth   Suvorexant     Other reaction(s): not effective   Tiotropium Bromide Monohydrate     Other reaction(s): dry mouth    Family History  Problem Relation Age of Onset   Hypertension Mother    Diabetes Mother    Allergies Mother    Heart attack Father 50   Cancer Father        kidney   Emphysema Father    Heart disease Father        CHF age 4   Heart attack Brother 33       Died of MI age 43     Prior to Admission medications   Medication Sig Start Date End Date Taking? Authorizing Provider  albuterol (VENTOLIN HFA) 108 (90 Base) MCG/ACT inhaler Inhale 2 puffs into the lungs every 6 (six) hours as needed for shortness of breath.    Yes [provider]  aspirin 81 MG chewable tablet Chew 81 mg by mouth daily.   Yes [provider]  Calcium Carbonate-Vitamin D 600-200 MG-UNIT TABS Take 1 tablet by mouth daily.    Yes [provider]  Clobetasol Prop Emollient Base 0.05 % emollient cream Apply 1 application topically 2 (two) times daily. 01/26/19  Yes [provider]  DULoxetine (CYMBALTA) 60 MG capsule Take 60 mg by mouth 2 (two) times daily.   Yes [provider]  etanercept (ENBREL) 50 MG/ML injection Inject 50 mg into the skin once a week. Friday   Yes [provider]  fluocinonide (LIDEX) 0.05 % external solution Apply 1 application topically 2 (two) times daily as needed (skin irritation).    Yes [provider]  fluticasone (FLONASE) 50 MCG/ACT nasal spray 2 sprays daily as needed for allergies. 05/18/18  Yes [provider]  folic acid (FOLVITE) 1 MG tablet Take 1 tablet by mouth daily.   Yes [provider]  HYDROcodone-acetaminophen (NORCO) 7.5-325 MG tablet Take 1 tablet by mouth every 4 (four) hours as needed for moderate pain or severe pain. 04/21/21 05/21/21 Yes Molli Barrows, MD  meloxicam (MOBIC) 15 MG tablet Take 15 mg by mouth daily.   Yes [provider]  metoprolol succinate (TOPROL-XL) 50 MG 24 hr tablet Take 1 tablet (50 mg total) by mouth 2 (two) times daily. Take with or immediately following a meal. Patient taking differently: Take 12.5 mg by mouth 2 (two) times daily. Take with or immediately following  a meal. 12/17/20  Yes Baldwin Jamaica, PA-C  Multiple Vitamin (MULTI-VITAMIN PO) Take 1 tablet by mouth daily.    Yes [provider]  nystatin (MYCOSTATIN/NYSTOP) powder 1 application daily as needed (skin irritation). 01/15/21  Yes [provider]  Omega-3 Fatty Acids (FISH OIL PO) Take 1,000 mg by mouth daily.    Yes [provider]  omeprazole (PRILOSEC) 40 MG capsule Take 40 mg by mouth daily. 04/08/17  Yes [provider]  OSPHENA 60 MG TABS Take 1 tablet by mouth daily. 06/25/19  Yes [provider]  rosuvastatin (CRESTOR) 20 MG tablet Take 20 mg by mouth once a week. 01/02/20  Yes [provider]  ACCU-CHEK GUIDE test strip  07/27/19   [provider]  Accu-Chek Softclix Lancets lancets  07/27/19   [provider]  Blood Glucose Monitoring Suppl (ACCU-CHEK GUIDE ME) w/Device KIT  07/27/19   [provider]  methotrexate 2.5 MG tablet Take 20 mg by mouth once a week. Patient not taking: Reported on 01/06/2021 01/31/20   [provider]    Physical Exam: Vitals:   04/04/21 0945 04/04/21 1015 04/04/21 1045 04/04/21 1145  BP: (!) 111/96 (!) 159/79 (!) 165/83 (!) 176/97  Pulse: 77 79 85 85  Resp: 20 (!) $Remo'26 14 18  'uMEDi$ Temp:      TempSrc:      SpO2: 98% 100% 96% 96%  Weight:       Height:        Constitutional: NAD, calm, comfortable Vitals:   04/04/21 0945 04/04/21 1015 04/04/21 1045 04/04/21 1145  BP: (!) 111/96 (!) 159/79 (!) 165/83 (!) 176/97  Pulse: 77 79 85 85  Resp: 20 (!) $Remo'26 14 18  'rhKdu$ Temp:      TempSrc:      SpO2: 98% 100% 96% 96%  Weight:      Height:       Eyes: PERRL, lids and conjunctivae normal ENMT: Mucous membranes are moist. Posterior pharynx clear of any exudate or lesions.Normal dentition.  Neck: normal, supple, no masses, no thyromegaly Respiratory: clear to auscultation bilaterally, no wheezing, no crackles. Normal respiratory effort. No accessory muscle use.  Cardiovascular: Regular rate and rhythm, no murmurs / rubs / gallops. No extremity edema. 2+ pedal pulses. No carotid bruits.  Abdomen: no tenderness, no masses palpated. No hepatosplenomegaly. Bowel sounds positive.  Musculoskeletal: no clubbing / cyanosis. No joint deformity upper and lower extremities. Good ROM, no contractures. Normal muscle tone.  Skin: no rashes, lesions, ulcers. No induration Neurologic: CN 2-12 grossly intact. Sensation intact, DTR normal. Strength 5/5 in all 4.  Psychiatric: Normal judgment and insight. Alert and oriented x 3. Normal mood.     Labs on Admission: I have personally reviewed following labs and imaging studies  CBC: Recent Labs  Lab 04/04/21 0621  WBC 8.6  NEUTROABS 6.6  HGB 13.0  HCT 38.2  MCV 91.4  PLT 673*   Basic Metabolic Panel: Recent Labs  Lab 04/04/21 0621  NA 132*  K 3.2*  CL 100  CO2 23  GLUCOSE 150*  BUN 16  CREATININE 0.59  CALCIUM 8.5*   GFR: Estimated Creatinine Clearance: 68.2 mL/min (by C-G formula based on SCr of 0.59 mg/dL). Liver Function Tests: Recent Labs  Lab 04/04/21 0621  AST 16  ALT 14  ALKPHOS 41  BILITOT 1.0  PROT 6.5  ALBUMIN 3.1*   No results for input(s): LIPASE, AMYLASE in the last 168 hours. No results for input(s): AMMONIA in  the last 168 hours. Coagulation Profile: Recent Labs   Lab 04/04/21 0621  INR 1.0   Cardiac Enzymes: No results for input(s): CKTOTAL, CKMB, CKMBINDEX, TROPONINI in the last 168 hours. BNP (last 3 results) No results for input(s): PROBNP in the last 8760 hours. HbA1C: No results for input(s): HGBA1C in the last 72 hours. CBG: No results for input(s): GLUCAP in the last 168 hours. Lipid Profile: No results for input(s): CHOL, HDL, LDLCALC, TRIG, CHOLHDL, LDLDIRECT in the last 72 hours. Thyroid Function Tests: No results for input(s): TSH, T4TOTAL, FREET4, T3FREE, THYROIDAB in the last 72 hours. Anemia Panel: No results for input(s): VITAMINB12, FOLATE, FERRITIN, TIBC, IRON, RETICCTPCT in the last 72 hours. Urine analysis:    Component Value Date/Time   COLORURINE AMBER (A) 04/04/2021 0750   APPEARANCEUR CLOUDY (A) 04/04/2021 0750   LABSPEC 1.014 04/04/2021 0750   PHURINE 7.0 04/04/2021 0750   GLUCOSEU NEGATIVE 04/04/2021 0750   HGBUR NEGATIVE 04/04/2021 0750   BILIRUBINUR NEGATIVE 04/04/2021 0750   KETONESUR NEGATIVE 04/04/2021 0750   PROTEINUR NEGATIVE 04/04/2021 0750   UROBILINOGEN 0.2 08/12/2008 2131   NITRITE NEGATIVE 04/04/2021 0750   LEUKOCYTESUR MODERATE (A) 04/04/2021 0750    Radiological Exams on Admission: DG Chest 2 View  Result Date: 04/04/2021 CLINICAL DATA:  76 year old female with history of suspected sepsis. EXAM: CHEST - 2 VIEW COMPARISON:  Chest x-ray 12/11/2020. FINDINGS: Lung volumes are normal. No consolidative airspace disease. No pleural effusions. No pneumothorax. No pulmonary nodule or mass noted. Pulmonary vasculature and the cardiomediastinal silhouette are within normal limits. Atherosclerosis in the thoracic aorta. IMPRESSION: 1.  No radiographic evidence of acute cardiopulmonary disease. 2. Aortic atherosclerosis. Electronically Signed   By: Vinnie Langton M.D.   On: 04/04/2021 06:42    EKG: Independently reviewed. Sinus, chronic incomplete RBBB  Assessment/Plan Principal Problem:   UTI  (urinary tract infection)  (please populate well all problems here in Problem List. (For example, if patient is on BP meds at home and you resume or decide to hold them, it is a problem that needs to be her. Same for CAD, COPD, HLD and so on)  Complicated UTI, acute -Presents with fever.  Patient also immune compromised for she is on Etanercept. But no Hx of highly resistant infection UTI in the past confirmed by the patient and her sister over the phone.  Continue ceftriaxone for now. -Patient request prophylactic antifungal treatment, reassured patient that prophylactic antifungal treatment not indicated.  Patient agreed that we will monitor fungal infection for now but without intervention.  General weakness/deconditioning -From UTI, PT evaluation tomorrow.  Hypokalemia -With her history of VT, give 40 mg p.o. KCl, check magnesium and phosphorus level.  Repeat potassium level tomorrow.  Hyperglycemia -No history of diabetes, recheck BMP tomorrow.  Hyponatremia -Mild, recheck sodium level tomorrow.  Hx of VT -Status post ablation October 2022.  Make K> 4, mag and phosphorus level pending.    DVT prophylaxis: Lovenox Code Status: Full code Family Communication: Niece at bedside and sister on the phone Disposition Plan: Patient conveys complicated UTI, with baseline immunocompromise, expect more than 2 midnight hospital stay. Consults called: None Admission status: Medsurg admit   Lequita Halt MD [facility] Pager [phone]  04/04/2021, 1:22 PM

## 2021-04-04 NOTE — Care Management Obs Status (Signed)
[place] NOTIFICATION   Patient Details  Name: Kelsey Diaz MRN: [id number] Date of Birth: 10-19-1945   Medicare Observation Status Notification Given:  Ernesta Amble, RN 04/04/2021, 10:33 PM

## 2021-04-04 NOTE — Progress Notes (Signed)
PHARMACY - PHYSICIAN COMMUNICATION CRITICAL VALUE ALERT - BLOOD CULTURE IDENTIFICATION (BCID)  Kelsey Diaz is an 76 y.o. female who presented to [facility] on 04/04/2021 with a chief complaint of urinary frequency, dysuria, suprapubic abdominal pain, and fever.   Assessment:  UA showing few bacteria, 0-5 squamous cells, WBC 21-50, mod leukocytes, and negative nitrites. 1/3 bottles (anaerobic only) growing GNR with BCID showing E Coli (no resistance).  Name of physician (or Provider) Contacted: Dr Myna Hidalgo  Current antibiotics: Ceftriaxone 1g IV every 24 hours  Changes to prescribed antibiotics recommended:  Will increase to ceftriaxone 2g IV every 24 hours - ordered another one time dose for 2/10.  Results for orders placed or performed during the hospital encounter of 04/04/21  Blood Culture ID Panel (Reflexed) (Collected: 04/04/2021  6:22 AM)  Result Value Ref Range   Enterococcus faecalis NOT DETECTED NOT DETECTED   Enterococcus Faecium NOT DETECTED NOT DETECTED   Listeria monocytogenes NOT DETECTED NOT DETECTED   Staphylococcus species NOT DETECTED NOT DETECTED   Staphylococcus aureus (BCID) NOT DETECTED NOT DETECTED   Staphylococcus epidermidis NOT DETECTED NOT DETECTED   Staphylococcus lugdunensis NOT DETECTED NOT DETECTED   Streptococcus species NOT DETECTED NOT DETECTED   Streptococcus agalactiae NOT DETECTED NOT DETECTED   Streptococcus pneumoniae NOT DETECTED NOT DETECTED   Streptococcus pyogenes NOT DETECTED NOT DETECTED   A.calcoaceticus-baumannii NOT DETECTED NOT DETECTED   Bacteroides fragilis NOT DETECTED NOT DETECTED   Enterobacterales DETECTED (A) NOT DETECTED   Enterobacter cloacae complex NOT DETECTED NOT DETECTED   Escherichia coli DETECTED (A) NOT DETECTED   Klebsiella aerogenes NOT DETECTED NOT DETECTED   Klebsiella oxytoca NOT DETECTED NOT DETECTED   Klebsiella pneumoniae NOT DETECTED NOT DETECTED   Proteus species NOT DETECTED NOT DETECTED   Salmonella  species NOT DETECTED NOT DETECTED   Serratia marcescens NOT DETECTED NOT DETECTED   Haemophilus influenzae NOT DETECTED NOT DETECTED   Neisseria meningitidis NOT DETECTED NOT DETECTED   Pseudomonas aeruginosa NOT DETECTED NOT DETECTED   Stenotrophomonas maltophilia NOT DETECTED NOT DETECTED   Candida albicans NOT DETECTED NOT DETECTED   Candida auris NOT DETECTED NOT DETECTED   Candida glabrata NOT DETECTED NOT DETECTED   Candida krusei NOT DETECTED NOT DETECTED   Candida parapsilosis NOT DETECTED NOT DETECTED   Candida tropicalis NOT DETECTED NOT DETECTED   Cryptococcus neoformans/gattii NOT DETECTED NOT DETECTED   CTX-M ESBL NOT DETECTED NOT DETECTED   Carbapenem resistance IMP NOT DETECTED NOT DETECTED   Carbapenem resistance KPC NOT DETECTED NOT DETECTED   Carbapenem resistance NDM NOT DETECTED NOT DETECTED   Carbapenem resist OXA 48 LIKE NOT DETECTED NOT DETECTED   Carbapenem resistance VIM NOT DETECTED NOT DETECTED    Antonietta Jewel, PharmD, BCCCP Clinical Pharmacist  Phone: [phone] 04/04/2021 9:06 PM  Please check AMION for all [facility] phone numbers After 10:00 PM, call [place] [phone]

## 2021-04-04 NOTE — ED Triage Notes (Signed)
Pt brought to ED by [facility] via [facility] with c/o weakness x2 days. EMS reports pt's room smelled of strong urine odor, possible UTI suspected.

## 2021-04-04 NOTE — Care Management Obs Status (Signed)
[place] NOTIFICATION   Patient Details  Name: Kelsey Diaz MRN: [id number] Date of Birth: 09-Feb-1946   Medicare Observation Status Notification Given:  Ernesta Amble, RN 04/04/2021, 10:32 PM

## 2021-04-04 NOTE — Progress Notes (Signed)
NEW ADMISSION NOTE New Admission Note:   Arrival Method: ED stretcher Mental Orientation: AAOX4 Telemetry: 38m 16 Assessment: Completed Skin: To be completed IV: Left hand Pain: 5/10 extremities  Tubes: n/a Safety Measures: Safety Fall Prevention Plan has been given, discussed and signed Admission: Completed [facility] Orientation: Patient has been orientated to the room, unit and staff.  Family: none at bedside  Orders have been reviewed and implemented. Will continue to monitor the patient. Call light has been placed within reach and bed alarm has been activated.   Vira Agar, RN

## 2021-04-04 NOTE — Care Management CC44 (Signed)
Condition Code 44 Documentation Completed  Patient Details  Name: Kelsey Diaz MRN: [id number] Date of Birth: [birth year]   Condition Code 44 given:  Yes Patient signature on Condition Code 44 notice:  Yes Documentation of 2 MD's agreement:  Yes Code 44 added to claim:  Yes    Laurena Slimmer, RN 04/04/2021, 10:33 PM

## 2021-04-04 NOTE — ED Provider Notes (Signed)
[facility] EMERGENCY DEPARTMENT Provider Note   CSN: [id number] Arrival date & time: 04/04/21  0555     History  Chief Complaint  Patient presents with   Weakness    Kelsey Diaz is a 76 y.o. female.  The history is provided by the patient. No language interpreter was used.  Weakness Severity:  Moderate Onset quality:  Gradual Timing:  Constant Progression:  Worsening Chronicity:  New Relieved by:  Nothing Worsened by:  Nothing Ineffective treatments:  None tried Associated symptoms: fever and nausea   Associated symptoms: no vomiting       Home Medications Prior to Admission medications   Medication Sig Start Date End Date Taking? Authorizing Provider  ACCU-CHEK GUIDE test strip  07/27/19   [provider]  Accu-Chek Softclix Lancets lancets  07/27/19   [provider]  albuterol (VENTOLIN HFA) 108 (90 Base) MCG/ACT inhaler Inhale 2 puffs into the lungs every 6 (six) hours as needed for shortness of breath.     [provider]  aspirin 81 MG chewable tablet 1 tablet    [provider]  Blood Glucose Monitoring Suppl (ACCU-CHEK GUIDE ME) w/Device KIT  07/27/19   [provider]  Calcium Carbonate-Vitamin D 600-200 MG-UNIT TABS Take 1 tablet by mouth daily.     [provider]  Clobetasol Prop Emollient Base 0.05 % emollient cream Apply 1 application topically 2 (two) times daily. 01/26/19   [provider]  DULoxetine (CYMBALTA) 60 MG capsule Take 120 mg by mouth daily.  Patient not taking: Reported on 02/04/2021    [provider]  DULoxetine (CYMBALTA) 60 MG capsule Take 1 capsule by mouth 2 (two) times daily.    [provider]  etanercept (ENBREL) 50 MG/ML injection Inject 50 mg into the skin once a week.    [provider]  fluocinonide (LIDEX) 0.05 % external solution Apply 1 application topically 2 (two) times daily as needed (skin irritation).     [provider]  fluticasone Asencion Islam) 50 MCG/ACT nasal spray 2 sprays 05/18/18   [provider]  folic acid (FOLVITE) 1 MG tablet Take 1 tablet by mouth daily.    [provider]  HYDROcodone-acetaminophen (NORCO) 7.5-325 MG tablet Take 1 tablet by mouth every 4 (four) hours as needed for moderate pain or severe pain. 04/21/21 05/21/21  Molli Barrows, MD  HYDROcodone-acetaminophen (NORCO) 7.5-325 MG tablet Take 1 tablet by mouth every 6 (six) hours as needed for moderate pain or severe pain. 05/21/21 06/20/21  Molli Barrows, MD  LORazepam (ATIVAN) 0.5 MG tablet Take 0.5 mg by mouth daily as needed for anxiety. Patient not taking: Reported on 01/06/2021 07/19/19   [provider]  meloxicam (MOBIC) 15 MG tablet Take 15 mg by mouth daily.    [provider]  meloxicam (MOBIC) 15 MG tablet Take 1 tablet by mouth daily. Patient not taking: Reported on 02/04/2021    [provider]  methotrexate 2.5 MG tablet Take 20 mg by mouth once a week. Patient not taking: Reported on 01/06/2021 01/31/20   [provider]  metoprolol succinate (TOPROL-XL) 50 MG 24 hr tablet Take 1 tablet (50 mg total) by mouth 2 (two) times daily. Take with or immediately following a meal. 12/17/20   Baldwin Jamaica, PA-C  Multiple Vitamin (MULTI-VITAMIN PO) Take 1 tablet by mouth daily.     [provider]  nystatin (MYCOSTATIN/NYSTOP) powder 1 application 53/66/44   [provider]  Omega-3 Fatty Acids (FISH OIL PO) Take 1,000 mg by mouth daily.     [provider]  omeprazole (PRILOSEC) 40 MG capsule Take 1 capsule by mouth daily. 04/08/17   [provider]  omeprazole (PRILOSEC) 40 MG capsule 1 tablet Patient not taking: Reported on 02/04/2021    [provider]  OSPHENA 60 MG TABS Take 1 tablet by mouth daily. 06/25/19   [provider]  rosuvastatin (CRESTOR) 20 MG tablet Take 20 mg by mouth once a week. 01/02/20    [provider]  Suvorexant (BELSOMRA) 10 MG TABS Take 1 tablet by mouth at bedtime as needed. Patient not taking: Reported on 01/06/2021    [provider]      Allergies    Morphine, Advair diskus [fluticasone-salmeterol], Ambien [zolpidem tartrate], Amlodipine besylate, Azithromycin, Bextra [valdecoxib], Bupropion, Captopril, Irbesartan, Latex, Lisinopril, Pulmicort [budesonide], Savella [milnacipran hcl], Spiriva [tiotropium bromide monohydrate], Suvorexant, and Tiotropium bromide monohydrate    Review of Systems   Review of Systems  Constitutional:  Positive for fever.  Gastrointestinal:  Positive for nausea. Negative for vomiting.  Neurological:  Positive for weakness.  All other systems reviewed and are negative.  Physical Exam Updated Vital Signs BP 135/80 (BP Location: Right Arm)    Pulse 91    Temp (!) 101.2 F (38.4 C) (Oral)    Resp 17    SpO2 93%  Physical Exam Vitals reviewed.  Constitutional:      Appearance: Normal appearance.  HENT:     Right Ear: Tympanic membrane normal.     Mouth/Throat:     Mouth: Mucous membranes are moist.  Cardiovascular:     Rate and Rhythm: Normal rate.     Pulses: Normal pulses.  Pulmonary:     Effort: Pulmonary effort is normal.  Abdominal:     General: Abdomen is flat.  Musculoskeletal:        General: Normal range of motion.     Cervical back: Normal range of motion.  Skin:    General: Skin is warm.  Neurological:     General: No focal deficit present.     Mental Status: She is alert.  Psychiatric:        Mood and Affect: Mood normal.    ED Results / Procedures / Treatments   Labs (all labs ordered are listed, but only abnormal results are displayed) Labs Reviewed  CBC WITH DIFFERENTIAL/PLATELET - Abnormal; Notable for the following components:      Result Value   Platelets 149 (*)    All other components within normal limits  CULTURE, BLOOD (ROUTINE X 2)  CULTURE, BLOOD (ROUTINE X 2)  URINE  CULTURE  RESP PANEL BY RT-PCR (FLU A&B, COVID) ARPGX2  PROTIME-INR  APTT  COMPREHENSIVE METABOLIC PANEL  LACTIC ACID, PLASMA  LACTIC ACID, PLASMA  URINALYSIS, ROUTINE W REFLEX MICROSCOPIC    EKG None  Radiology DG Chest 2 View  Result Date: 04/04/2021 CLINICAL DATA:  76 year old female with history of suspected sepsis. EXAM: CHEST - 2 VIEW COMPARISON:  Chest x-ray 12/11/2020. FINDINGS: Lung volumes are normal. No consolidative airspace disease. No pleural effusions. No pneumothorax. No pulmonary nodule or mass noted. Pulmonary vasculature and the cardiomediastinal silhouette are within normal limits. Atherosclerosis in the thoracic aorta. IMPRESSION: 1.  No radiographic evidence of acute cardiopulmonary disease. 2. Aortic atherosclerosis. Electronically Signed   By: Vinnie Langton M.D.   On: 04/04/2021 06:42    Procedures .Critical Care Performed by: Fransico Meadow, PA-C  Authorized by: Fransico Meadow, PA-C   Critical care provider statement:    Critical care time (minutes):  45   Critical care start time:  04/04/2021 7:30 AM   Critical care end time:  04/04/2021 12:00 PM   Critical care time was exclusive of:  Separately billable procedures and treating other patients and teaching time   Critical care was necessary to treat or prevent imminent or life-threatening deterioration of the following conditions:  Sepsis   Critical care was time spent personally by me on the following activities:  Obtaining history from patient or surrogate, interpretation of cardiac output measurements, examination of patient, blood draw for specimens, development of treatment plan with patient or surrogate, ordering and review of radiographic studies and re-evaluation of patient's condition   Care discussed with: admitting provider      Medications Ordered in ED Medications - No data to display  ED Course/ Medical Decision Making/ A&P                           Medical Decision Making Amount and/or  Complexity of Data Reviewed External Data Reviewed: notes.    Details: primary care notes reviewed Labs: ordered. Decision-making details documented in ED Course.    Details: Ua shows leukocytes, cbc normal Radiology: ordered and independent interpretation performed. Decision-making details documented in ED Course.    Details: Chest xray  no radiographic abnormality  Risk OTC drugs. Prescription drug management. Decision regarding hospitalization.  Critical Care Total time providing critical care: 30-74 minutes          Final Clinical Impression(s) / ED Diagnoses Final diagnoses:  Urinary tract infection without hematuria, site unspecified  Sepsis, due to unspecified organism, unspecified whether acute organ dysfunction present [facility])    Rx / DC Orders ED Discharge Orders     None         Sidney Ace 04/09/21 Mechele Claude, MD 04/10/21 1759

## 2021-04-05 DIAGNOSIS — B9620 Unspecified Escherichia coli [E. coli] as the cause of diseases classified elsewhere: Secondary | ICD-10-CM | POA: Diagnosis not present

## 2021-04-05 DIAGNOSIS — A4151 Sepsis due to Escherichia coli [E. coli]: Secondary | ICD-10-CM

## 2021-04-05 DIAGNOSIS — R7881 Bacteremia: Secondary | ICD-10-CM | POA: Diagnosis not present

## 2021-04-05 DIAGNOSIS — N390 Urinary tract infection, site not specified: Secondary | ICD-10-CM | POA: Diagnosis not present

## 2021-04-05 DIAGNOSIS — N3000 Acute cystitis without hematuria: Secondary | ICD-10-CM | POA: Diagnosis not present

## 2021-04-05 DIAGNOSIS — I10 Essential (primary) hypertension: Secondary | ICD-10-CM | POA: Diagnosis not present

## 2021-04-05 HISTORY — DX: Sepsis due to Escherichia coli (e. coli): A41.51

## 2021-04-05 HISTORY — DX: Bacteremia: R78.81

## 2021-04-05 LAB — BASIC METABOLIC PANEL
Anion gap: 8 (ref 5–15)
BUN: 9 mg/dL (ref 8–23)
CO2: 24 mmol/L (ref 22–32)
Calcium: 8 mg/dL — ABNORMAL LOW (ref 8.9–10.3)
Chloride: 106 mmol/L (ref 98–111)
Creatinine, Ser: 0.61 mg/dL (ref 0.44–1.00)
GFR, Estimated: >60 mL/min (ref >60)
Glucose, Bld: 116 mg/dL — ABNORMAL HIGH (ref 70–99)
Potassium: 3.7 mmol/L (ref 3.5–5.1)
Sodium: 138 mmol/L (ref 135–145)

## 2021-04-05 NOTE — Progress Notes (Signed)
Physical Therapy Note  PT eval complete with full note to follow;  Recommend Home with HHPT f/u for transition out of hospital;  Recommend 3in1 commode as well  Roney Marion, [place] Pager [phone] Office [phone]

## 2021-04-05 NOTE — Progress Notes (Addendum)
PROGRESS NOTE    Kelsey Diaz  GUR:[id number] DOB: [birth year] DOA: 04/04/2021 PCP: Harlan Stains, MD   Brief Narrative:  Kelsey Diaz is a 76 y.o. female with medical history significant of RA on Etanercept, PVCs/VT s/p ablation, COPD, HTN, frequent UTIs, anxiety, depression, OSA not tolerated CPAP, came with urinary frequency, dysuria suprapubic abdominal pain and fever. Found to have UTI at intake, admitted for IV antibiotics.  Assessment & Plan:   Principal Problem:   Sepsis due to Escherichia coli (E. coli) (HCC) Active Problems:   HTN (hypertension)   Ventricular tachycardia   E-coli UTI   Bacteremia    Sepsis (POA) secondary to:  Complicated Ecoli UTI, acute Concurrent Ecoli Bacteremia -All POA - Immune compromised for she is on Etanercept - Continue supportive care   General weakness/deconditioning -Secondary to above -PT/OT to follow   Hypokalemia -Follow repeat labs   Hyperglycemia -No history of diabetes, recheck BMP tomorrow.   Hypovolemic hyponatremia -Mild   Hx of VT -Electrolytes WNL  DVT prophylaxis: lovenox Code Status: Full Family Communication: At bedside/speaker-phone  Status is: Inpt  Dispo: The patient is from: Home              Anticipated d/c is to: Home              Anticipated d/c date is: 48-72h              Patient currently NOT medically stable for discharge  Consultants:  None  Procedures:  None  Antimicrobials:  Ceftriaxone   Subjective: No acute issues/events overnight  Objective: Vitals:   04/05/21 0115 04/05/21 0457 04/05/21 0847 04/05/21 1615  BP:  (!) 158/71 (!) 170/78 (!) 158/74  Pulse:  75 72 64  Resp:  17 19 18   Temp: 99.4 F (37.4 C) 98.8 F (37.1 C) 98.5 F (36.9 C) 98.6 F (37 C)  TempSrc:  Oral Oral Oral  SpO2:  95% 95% 97%  Weight:      Height:        Intake/Output Summary (Last 24 hours) at 04/05/2021 1653 Last data filed at 04/05/2021 1500 Gross per 24 hour  Intake 2503.48 ml   Output 804 ml  Net 1699.48 ml   Filed Weights   04/04/21 0805 04/04/21 1554  Weight: 86.2 kg 89.4 kg    Examination:  General exam: Appears calm and comfortable  Respiratory system: Clear to auscultation. Respiratory effort normal. Cardiovascular system: S1 & S2 heard, RRR. No JVD, murmurs, rubs, gallops or clicks. No pedal edema. Gastrointestinal system: Abdomen is nondistended, soft and nontender. No organomegaly or masses felt. Normal bowel sounds heard. Central nervous system: Alert and oriented. No focal neurological deficits. Extremities: Symmetric 5 x 5 power. Skin: No rashes, lesions or ulcers Psychiatry: Judgement and insight appear normal. Mood & affect appropriate.     Data Reviewed: I have personally reviewed following labs and imaging studies  CBC: Recent Labs  Lab 04/04/21 0621  WBC 8.6  NEUTROABS 6.6  HGB 13.0  HCT 38.2  MCV 91.4  PLT 283*   Basic Metabolic Panel: Recent Labs  Lab 04/04/21 0621 04/04/21 1648 04/05/21 0127  NA 132*  --  138  K 3.2*  --  3.7  CL 100  --  106  CO2 23  --  24  GLUCOSE 150*  --  116*  BUN 16  --  9  CREATININE 0.59  --  0.61  CALCIUM 8.5*  --  8.0*  MG  --  1.7  --   PHOS  --  2.4*  --    GFR: Estimated Creatinine Clearance: 69.3 mL/min (by C-G formula based on SCr of 0.61 mg/dL). Liver Function Tests: Recent Labs  Lab 04/04/21 0621  AST 16  ALT 14  ALKPHOS 41  BILITOT 1.0  PROT 6.5  ALBUMIN 3.1*   No results for input(s): LIPASE, AMYLASE in the last 168 hours. No results for input(s): AMMONIA in the last 168 hours. Coagulation Profile: Recent Labs  Lab 04/04/21 0621  INR 1.0   Cardiac Enzymes: No results for input(s): CKTOTAL, CKMB, CKMBINDEX, TROPONINI in the last 168 hours. BNP (last 3 results) No results for input(s): PROBNP in the last 8760 hours. HbA1C: No results for input(s): HGBA1C in the last 72 hours. CBG: No results for input(s): GLUCAP in the last 168 hours. Lipid Profile: No  results for input(s): CHOL, HDL, LDLCALC, TRIG, CHOLHDL, LDLDIRECT in the last 72 hours. Thyroid Function Tests: No results for input(s): TSH, T4TOTAL, FREET4, T3FREE, THYROIDAB in the last 72 hours. Anemia Panel: No results for input(s): VITAMINB12, FOLATE, FERRITIN, TIBC, IRON, RETICCTPCT in the last 72 hours. Sepsis Labs: Recent Labs  Lab 04/04/21 0622 04/04/21 1648  LATICACIDVEN 0.9 1.4    Recent Results (from the past 240 hour(s))  Urine Culture     Status: Abnormal (Preliminary result)   Collection Time: 04/04/21  6:18 AM   Specimen: In/Out Cath Urine  Result Value Ref Range Status   Specimen Description IN/OUT CATH URINE  Final   Special Requests NONE  Final   Culture (A)  Final    >=100,000 COLONIES/mL ESCHERICHIA COLI SUSCEPTIBILITIES TO FOLLOW Performed at [facility], [place] [address], [place], [place] [address]    Report Status PENDING  Incomplete  Resp Panel by RT-PCR (Flu A&B, Covid) Nasopharyngeal Swab     Status: None   Collection Time: 04/04/21  6:19 AM   Specimen: Nasopharyngeal Swab; Nasopharyngeal(NP) swabs in vial transport medium  Result Value Ref Range Status   SARS Coronavirus 2 by RT PCR NEGATIVE NEGATIVE Final    Comment: (NOTE) SARS-CoV-2 target nucleic acids are NOT DETECTED.  The SARS-CoV-2 RNA is generally detectable in upper respiratory specimens during the acute phase of infection. The lowest concentration of SARS-CoV-2 viral copies this assay can detect is 138 copies/mL. A negative result does not preclude SARS-Cov-2 infection and should not be used as the sole basis for treatment or other patient management decisions. A negative result may occur with  improper specimen collection/handling, submission of specimen other than nasopharyngeal swab, presence of viral mutation(s) within the areas targeted by this assay, and inadequate number of viral copies(<138 copies/mL). A negative result must be combined with clinical observations,  patient history, and epidemiological information. The expected result is Negative.  Fact Sheet for Patients:  EntrepreneurPulse.com.au  Fact Sheet for Healthcare Providers:  IncredibleEmployment.be  This test is no t yet approved or cleared by the [place] FDA and  has been authorized for detection and/or diagnosis of SARS-CoV-2 by FDA under an Emergency Use Authorization (EUA). This EUA will remain  in effect (meaning this test can be used) for the duration of the COVID-19 declaration under Section 564(b)(1) of the Act, 21 U.S.C.section 360bbb-3(b)(1), unless the authorization is terminated  or revoked sooner.       Influenza A by PCR NEGATIVE NEGATIVE Final   Influenza B by PCR NEGATIVE NEGATIVE Final    Comment: (NOTE) The Xpert Xpress SARS-CoV-2/FLU/RSV plus assay is intended  as an aid in the diagnosis of influenza from Nasopharyngeal swab specimens and should not be used as a sole basis for treatment. Nasal washings and aspirates are unacceptable for Xpert Xpress SARS-CoV-2/FLU/RSV testing.  Fact Sheet for Patients: EntrepreneurPulse.com.au  Fact Sheet for Healthcare Providers: IncredibleEmployment.be  This test is not yet approved or cleared by the [place] FDA and has been authorized for detection and/or diagnosis of SARS-CoV-2 by FDA under an Emergency Use Authorization (EUA). This EUA will remain in effect (meaning this test can be used) for the duration of the COVID-19 declaration under Section 564(b)(1) of the Act, 21 U.S.C. section 360bbb-3(b)(1), unless the authorization is terminated or revoked.  Performed at [facility], [place] [address] [place], [place] [address]   Culture, blood (Routine x 2)     Status: Abnormal (Preliminary result)   Collection Time: 04/04/21  6:22 AM   Specimen: BLOOD LEFT ARM  Result Value Ref Range Status   Specimen Description BLOOD LEFT ARM   Final   Special Requests   Final    BOTTLES DRAWN AEROBIC AND ANAEROBIC Blood Culture results may not be optimal due to an excessive volume of blood received in culture bottles   Culture  Setup Time   Final    GRAM NEGATIVE RODS ANAEROBIC BOTTLE ONLY CRITICAL RESULT CALLED TO, READ BACK BY AND VERIFIED WITH: K,HURTH PHARMD @2018  04/04/21 EB IN BOTH AEROBIC AND ANAEROBIC BOTTLES    Culture (A)  Final    ESCHERICHIA COLI SUSCEPTIBILITIES TO FOLLOW Performed at [facility], [address], [place], [place] [address]    Report Status PENDING  Incomplete  Culture, blood (Routine x 2)     Status: Abnormal (Preliminary result)   Collection Time: 04/04/21  6:22 AM   Specimen: BLOOD RIGHT ARM  Result Value Ref Range Status   Specimen Description BLOOD RIGHT ARM  Final   Special Requests   Final    AEROBIC BOTTLE ONLY Blood Culture results may not be optimal due to an excessive volume of blood received in culture bottles   Culture  Setup Time   Final    GRAM NEGATIVE RODS AEROBIC BOTTLE ONLY CRITICAL VALUE NOTED.  VALUE IS CONSISTENT WITH PREVIOUSLY REPORTED AND CALLED VALUE.    Culture (A)  Final    ESCHERICHIA COLI CULTURE REINCUBATED FOR BETTER GROWTH Performed at [facility], [place] [address] [place], [place] [address]    Report Status PENDING  Incomplete  Blood Culture ID Panel (Reflexed)     Status: Abnormal   Collection Time: 04/04/21  6:22 AM  Result Value Ref Range Status   Enterococcus faecalis NOT DETECTED NOT DETECTED Final   Enterococcus Faecium NOT DETECTED NOT DETECTED Final   Listeria monocytogenes NOT DETECTED NOT DETECTED Final   Staphylococcus species NOT DETECTED NOT DETECTED Final   Staphylococcus aureus (BCID) NOT DETECTED NOT DETECTED Final   Staphylococcus epidermidis NOT DETECTED NOT DETECTED Final   Staphylococcus lugdunensis NOT DETECTED NOT DETECTED Final   Streptococcus species NOT DETECTED NOT DETECTED Final   Streptococcus agalactiae NOT  DETECTED NOT DETECTED Final   Streptococcus pneumoniae NOT DETECTED NOT DETECTED Final   Streptococcus pyogenes NOT DETECTED NOT DETECTED Final   A.calcoaceticus-baumannii NOT DETECTED NOT DETECTED Final   Bacteroides fragilis NOT DETECTED NOT DETECTED Final   Enterobacterales DETECTED (A) NOT DETECTED Final    Comment: Enterobacterales represent a large order of gram negative bacteria, not a single organism. CRITICAL RESULT CALLED TO, READ BACK  BY AND VERIFIED WITH: K,HURTH PHARMD @2018  04/04/21 EB    Enterobacter cloacae complex NOT DETECTED NOT DETECTED Final   Escherichia coli DETECTED (A) NOT DETECTED Final    Comment: CRITICAL RESULT CALLED TO, READ BACK BY AND VERIFIED WITH: K,HURTH PHARMD @2018  04/04/21 EB    Klebsiella aerogenes NOT DETECTED NOT DETECTED Final   Klebsiella oxytoca NOT DETECTED NOT DETECTED Final   Klebsiella pneumoniae NOT DETECTED NOT DETECTED Final   Proteus species NOT DETECTED NOT DETECTED Final   Salmonella species NOT DETECTED NOT DETECTED Final   Serratia marcescens NOT DETECTED NOT DETECTED Final   Haemophilus influenzae NOT DETECTED NOT DETECTED Final   Neisseria meningitidis NOT DETECTED NOT DETECTED Final   Pseudomonas aeruginosa NOT DETECTED NOT DETECTED Final   Stenotrophomonas maltophilia NOT DETECTED NOT DETECTED Final   Candida albicans NOT DETECTED NOT DETECTED Final   Candida auris NOT DETECTED NOT DETECTED Final   Candida glabrata NOT DETECTED NOT DETECTED Final   Candida krusei NOT DETECTED NOT DETECTED Final   Candida parapsilosis NOT DETECTED NOT DETECTED Final   Candida tropicalis NOT DETECTED NOT DETECTED Final   Cryptococcus neoformans/gattii NOT DETECTED NOT DETECTED Final   CTX-M ESBL NOT DETECTED NOT DETECTED Final   Carbapenem resistance IMP NOT DETECTED NOT DETECTED Final   Carbapenem resistance KPC NOT DETECTED NOT DETECTED Final   Carbapenem resistance NDM NOT DETECTED NOT DETECTED Final   Carbapenem resist OXA 48 LIKE NOT  DETECTED NOT DETECTED Final   Carbapenem resistance VIM NOT DETECTED NOT DETECTED Final    Comment: Performed at [facility], [address] [place], [place] [address]         Radiology Studies: DG Chest 2 View  Result Date: 04/04/2021 CLINICAL DATA:  76 year old female with history of suspected sepsis. EXAM: CHEST - 2 VIEW COMPARISON:  Chest x-ray 12/11/2020. FINDINGS: Lung volumes are normal. No consolidative airspace disease. No pleural effusions. No pneumothorax. No pulmonary nodule or mass noted. Pulmonary vasculature and the cardiomediastinal silhouette are within normal limits. Atherosclerosis in the thoracic aorta. IMPRESSION: 1.  No radiographic evidence of acute cardiopulmonary disease. 2. Aortic atherosclerosis. Electronically Signed   By: Vinnie Langton M.D.   On: 04/04/2021 06:42        Scheduled Meds:  acidophilus  2 capsule Oral TID   aspirin  81 mg Oral Daily   calcium-vitamin D  1 tablet Oral Daily   DULoxetine  60 mg Oral BID   enoxaparin (LOVENOX) injection  40 mg Subcutaneous [id number]   folic acid  1 mg Oral Daily   meloxicam  15 mg Oral Daily   metoprolol succinate  12.5 mg Oral BID   Ospemifene  1 tablet Oral Daily   pantoprazole  40 mg Oral Daily   Continuous Infusions:  sodium chloride 1,000 mL (04/05/21 0024)   cefTRIAXone (ROCEPHIN)  IV       LOS: 1 day   Time spent: 64min  Vinod Mikesell C Johnhenry Tippin, DO [facility]  If 7PM-7AM, please contact night-coverage [email or web address]  04/05/2021, 4:53 PM

## 2021-04-05 NOTE — Plan of Care (Signed)

## 2021-04-05 NOTE — Evaluation (Signed)
Physical Therapy Evaluation Patient Details Name: Kelsey Diaz MRN: [id number] DOB: May 08, 1945 Today's Date: 04/05/2021  History of Present Illness  Kelsey Diaz is a 76 y.o. female with medical history significant of RA on Etanercept, PVCs/VT s/p ablation, COPD, HTN, frequent UTIs, anxiety/depression, OSA not tolerated CPAP, came with urinary frequency, dysuria suprapubic abdominal pain and fever.  Clinical Impression   Pt admitted with above diagnosis. Lives at home with daughter in a mobile home with a ramped entrance and one step inside the home; walks with a 3 wheeled RW at baseline; Presents to PT with generalized weakness and decr activity tolerance, which effects her independence and safety;   Pt currently with functional limitations due to the deficits listed below (see PT Problem List). Pt will benefit from skilled PT to increase their independence and safety with mobility to allow discharge to the venue listed below.          Recommendations for follow up therapy are one component of a multi-disciplinary discharge planning process, led by the attending physician.  Recommendations may be updated based on patient status, additional functional criteria and insurance authorization.  Follow Up Recommendations Home health PT    Assistance Recommended at Discharge Set up Supervision/Assistance  Patient can return home with the following  A little help with walking and/or transfers;Assistance with cooking/housework    Equipment Recommendations BSC/3in1  Recommendations for Other Services       Functional Status Assessment Patient has had a recent decline in their functional status and demonstrates the ability to make significant improvements in function in a reasonable and predictable amount of time.     Precautions / Restrictions Precautions Precautions: Fall      Mobility  Bed Mobility Overal bed mobility: Needs Assistance Bed Mobility: Supine to Sit     Supine  to sit: Min guard (without Phayical contact)     General bed mobility comments: Slow, but no physical assist needed; cues for technqiue    Transfers Overall transfer level: Needs assistance Equipment used: Rolling walker (2 wheels) Transfers: Sit to/from Stand Sit to Stand: Min assist           General transfer comment: Min assist to steady RW; Cues for hand placement    Ambulation/Gait Ambulation/Gait assistance: Min guard (with and without physical contact) Gait Distance (Feet): 22 Feet Assistive device: Rolling walker (2 wheels) Gait Pattern/deviations: Step-through pattern, Decreased step length - right, Decreased step length - left       General Gait Details: Cues to self-monitor for activity tolerance   Stairs            Wheelchair Mobility    Modified Rankin (Stroke Patients Only)       Balance Overall balance assessment: Needs assistance   Sitting balance-Leahy Scale: Fair       Standing balance-Leahy Scale: Poor                               Pertinent Vitals/Pain Pain Assessment Pain Assessment: No/denies pain    Home Living Family/patient expects to be discharged to:: Private residence Living Arrangements: Children Available Help at Discharge: Family (at or near 24 hours) Type of Home: Mobile home Home Access: Ramped entrance       Home Layout: One level;Other (Comment) (one step to get into addition to home) Home Equipment: Rolling Walker (2 wheels) (pt's Rollator has 3 wheels, no seat)      Prior Function  Prior Level of Function : Independent/Modified Independent             Mobility Comments: walks household distances with RW ADLs Comments: Pt reports no difficulty     Hand Dominance        Extremity/Trunk Assessment   Upper Extremity Assessment Upper Extremity Assessment: Generalized weakness    Lower Extremity Assessment Lower Extremity Assessment: Generalized weakness       Communication    Communication: No difficulties  Cognition Arousal/Alertness: Awake/alert Behavior During Therapy: WFL for tasks assessed/performed Overall Cognitive Status: Within Functional Limits for tasks assessed (for simple tasks)                                          General Comments General comments (skin integrity, edema, etc.): Encouraged pt to go to bathroom with staff assist rather than using porewick    Exercises     Assessment/Plan    PT Assessment Patient needs continued PT services  PT Problem List Decreased strength;Decreased activity tolerance;Decreased balance;Decreased mobility;Decreased knowledge of use of DME       PT Treatment Interventions DME instruction;Gait training;Stair training;Functional mobility training;Therapeutic activities;Therapeutic exercise;Balance training;Patient/family education    PT Goals (Current goals can be found in the Care Plan section)  Acute Rehab PT Goals Patient Stated Goal: Home soon PT Goal Formulation: With patient Time For Goal Achievement: 04/19/21 Potential to Achieve Goals: Good    Frequency Min 3X/week     Co-evaluation               AM-PAC PT "6 Clicks" Mobility  Outcome Measure Help needed turning from your back to your side while in a flat bed without using bedrails?: A Little Help needed moving from lying on your back to sitting on the side of a flat bed without using bedrails?: A Little Help needed moving to and from a bed to a chair (including a wheelchair)?: A Little Help needed standing up from a chair using your arms (e.g., wheelchair or bedside chair)?: A Little Help needed to walk in hospital room?: A Little Help needed climbing 3-5 steps with a railing? : A Little 6 Click Score: 18    End of Session Equipment Utilized During Treatment: Gait belt Activity Tolerance: Patient tolerated treatment well Patient left: with call bell/phone within reach;Other (comment) (in bathroom with call  string near) Nurse Communication: Mobility status;Other (comment) (pt in bathroom) PT Visit Diagnosis: Unsteadiness on feet (R26.81);Muscle weakness (generalized) (M62.81)    Time: 6160-7371 PT Time Calculation (min) (ACUTE ONLY): 18 min   Charges:   PT Evaluation $PT Eval Moderate Complexity: 1 Mod          Roney [place], [place]  Acute Rehabilitation Services Pager [phone] Office [phone]   Colletta [place] 04/05/2021, 4:14 PM

## 2021-04-06 DIAGNOSIS — E876 Hypokalemia: Secondary | ICD-10-CM | POA: Diagnosis present

## 2021-04-06 DIAGNOSIS — R531 Weakness: Secondary | ICD-10-CM | POA: Diagnosis present

## 2021-04-06 DIAGNOSIS — Z833 Family history of diabetes mellitus: Secondary | ICD-10-CM | POA: Diagnosis not present

## 2021-04-06 DIAGNOSIS — B9620 Unspecified Escherichia coli [E. coli] as the cause of diseases classified elsewhere: Secondary | ICD-10-CM | POA: Diagnosis not present

## 2021-04-06 DIAGNOSIS — N390 Urinary tract infection, site not specified: Secondary | ICD-10-CM | POA: Diagnosis present

## 2021-04-06 DIAGNOSIS — Z87891 Personal history of nicotine dependence: Secondary | ICD-10-CM | POA: Diagnosis not present

## 2021-04-06 DIAGNOSIS — Z885 Allergy status to narcotic agent status: Secondary | ICD-10-CM | POA: Diagnosis not present

## 2021-04-06 DIAGNOSIS — J449 Chronic obstructive pulmonary disease, unspecified: Secondary | ICD-10-CM | POA: Diagnosis present

## 2021-04-06 DIAGNOSIS — Z8051 Family history of malignant neoplasm of kidney: Secondary | ICD-10-CM | POA: Diagnosis not present

## 2021-04-06 DIAGNOSIS — Z20822 Contact with and (suspected) exposure to covid-19: Secondary | ICD-10-CM | POA: Diagnosis present

## 2021-04-06 DIAGNOSIS — R7881 Bacteremia: Secondary | ICD-10-CM | POA: Diagnosis not present

## 2021-04-06 DIAGNOSIS — Z7982 Long term (current) use of aspirin: Secondary | ICD-10-CM | POA: Diagnosis not present

## 2021-04-06 DIAGNOSIS — Z791 Long term (current) use of non-steroidal anti-inflammatories (NSAID): Secondary | ICD-10-CM | POA: Diagnosis not present

## 2021-04-06 DIAGNOSIS — N3000 Acute cystitis without hematuria: Secondary | ICD-10-CM | POA: Diagnosis not present

## 2021-04-06 DIAGNOSIS — Z888 Allergy status to other drugs, medicaments and biological substances status: Secondary | ICD-10-CM | POA: Diagnosis not present

## 2021-04-06 DIAGNOSIS — Z87440 Personal history of urinary (tract) infections: Secondary | ICD-10-CM | POA: Diagnosis not present

## 2021-04-06 DIAGNOSIS — Z8249 Family history of ischemic heart disease and other diseases of the circulatory system: Secondary | ICD-10-CM | POA: Diagnosis not present

## 2021-04-06 DIAGNOSIS — M797 Fibromyalgia: Secondary | ICD-10-CM | POA: Diagnosis present

## 2021-04-06 DIAGNOSIS — Z881 Allergy status to other antibiotic agents status: Secondary | ICD-10-CM | POA: Diagnosis not present

## 2021-04-06 DIAGNOSIS — K219 Gastro-esophageal reflux disease without esophagitis: Secondary | ICD-10-CM | POA: Diagnosis present

## 2021-04-06 DIAGNOSIS — E871 Hypo-osmolality and hyponatremia: Secondary | ICD-10-CM | POA: Diagnosis present

## 2021-04-06 DIAGNOSIS — Z825 Family history of asthma and other chronic lower respiratory diseases: Secondary | ICD-10-CM | POA: Diagnosis not present

## 2021-04-06 DIAGNOSIS — G4733 Obstructive sleep apnea (adult) (pediatric): Secondary | ICD-10-CM | POA: Diagnosis present

## 2021-04-06 DIAGNOSIS — I10 Essential (primary) hypertension: Secondary | ICD-10-CM | POA: Diagnosis not present

## 2021-04-06 DIAGNOSIS — R739 Hyperglycemia, unspecified: Secondary | ICD-10-CM | POA: Diagnosis present

## 2021-04-06 DIAGNOSIS — Z79899 Other long term (current) drug therapy: Secondary | ICD-10-CM | POA: Diagnosis not present

## 2021-04-06 DIAGNOSIS — A4151 Sepsis due to Escherichia coli [E. coli]: Secondary | ICD-10-CM | POA: Diagnosis present

## 2021-04-06 DIAGNOSIS — M069 Rheumatoid arthritis, unspecified: Secondary | ICD-10-CM | POA: Diagnosis present

## 2021-04-06 LAB — URINE CULTURE: Culture: >=100000 — AB

## 2021-04-06 LAB — CULTURE, BLOOD (ROUTINE X 2)

## 2021-04-06 NOTE — Progress Notes (Signed)
PROGRESS NOTE    Kelsey Diaz  NTZ:[id number] DOB: 04-20-1945 DOA: 04/04/2021 PCP: Harlan Stains, MD   Brief Narrative:  Kelsey Diaz is a 76 y.o. female with medical history significant of RA on Etanercept, PVCs/VT s/p ablation, COPD, HTN, frequent UTIs, anxiety, depression, OSA not tolerated CPAP, came with urinary frequency, dysuria suprapubic abdominal pain and fever. Found to have UTI at intake, admitted for IV antibiotics.  Assessment & Plan:   Principal Problem:   Sepsis due to Escherichia coli (E. coli) (HCC) Active Problems:   HTN (hypertension)   Ventricular tachycardia   E-coli UTI   Bacteremia   Sepsis (POA) secondary to:  Complicated Ecoli UTI, acute, POA Concurrent Ecoli Bacteremia, POA -All POA - Immune compromised for she is on Etanercept - Continue supportive care   General weakness/deconditioning -Secondary to above -PT/OT to follow   Hypokalemia -Follow repeat labs   Hyperglycemia -No history of diabetes, recheck BMP tomorrow.   Hypovolemic hyponatremia -Mild   Hx of VT -Electrolytes WNL  DVT prophylaxis: lovenox Code Status: Full Family Communication: None  Status is: Inpt  Dispo: The patient is from: Home              Anticipated d/c is to: Home              Anticipated d/c date is: 24 to 48 hours              Patient currently NOT medically stable for discharge  Consultants:  None  Procedures:  None  Antimicrobials:  Ceftriaxone   Subjective: No acute issues/events overnight; feeling somewhat improved from yesterday but not yet back to baseline  Objective: Vitals:   04/05/21 0847 04/05/21 1615 04/05/21 2019 04/06/21 0428  BP: (!) 170/78 (!) 158/74 (!) 176/80 (!) 174/82  Pulse: 72 64 71 72  Resp: 19 18 18 18   Temp: 98.5 F (36.9 C) 98.6 F (37 C) 98.3 F (36.8 C) 98.1 F (36.7 C)  TempSrc: Oral Oral Oral Oral  SpO2: 95% 97% 99% 97%  Weight:      Height:        Intake/Output Summary (Last 24 hours) at  04/06/2021 0720 Last data filed at 04/06/2021 0600 Gross per 24 hour  Intake 2140.6 ml  Output 4 ml  Net 2136.6 ml    Filed Weights   04/04/21 0805 04/04/21 1554  Weight: 86.2 kg 89.4 kg    Examination:  General exam: Appears calm and comfortable  Respiratory system: Clear to auscultation. Respiratory effort normal. Cardiovascular system: S1 & S2 heard, RRR. No JVD, murmurs, rubs, gallops or clicks. No pedal edema. Gastrointestinal system: Abdomen is nondistended, soft and nontender. No organomegaly or masses felt. Normal bowel sounds heard. Central nervous system: Alert and oriented. No focal neurological deficits. Extremities: Symmetric 5 x 5 power. Skin: No rashes, lesions or ulcers Psychiatry: Judgement and insight appear normal. Mood & affect appropriate.   Data Reviewed: I have personally reviewed following labs and imaging studies  CBC: Recent Labs  Lab 04/04/21 0621  WBC 8.6  NEUTROABS 6.6  HGB 13.0  HCT 38.2  MCV 91.4  PLT 149*    Basic Metabolic Panel: Recent Labs  Lab 04/04/21 0621 04/04/21 1648 04/05/21 0127  NA 132*  --  138  K 3.2*  --  3.7  CL 100  --  106  CO2 23  --  24  GLUCOSE 150*  --  116*  BUN 16  --  9  CREATININE 0.59  --  0.61  CALCIUM 8.5*  --  8.0*  MG  --  1.7  --   PHOS  --  2.4*  --     GFR: Estimated Creatinine Clearance: 69.3 mL/min (by C-G formula based on SCr of 0.61 mg/dL). Liver Function Tests: Recent Labs  Lab 04/04/21 0621  AST 16  ALT 14  ALKPHOS 41  BILITOT 1.0  PROT 6.5  ALBUMIN 3.1*    No results for input(s): LIPASE, AMYLASE in the last 168 hours. No results for input(s): AMMONIA in the last 168 hours. Coagulation Profile: Recent Labs  Lab 04/04/21 0621  INR 1.0    Cardiac Enzymes: No results for input(s): CKTOTAL, CKMB, CKMBINDEX, TROPONINI in the last 168 hours. BNP (last 3 results) No results for input(s): PROBNP in the last 8760 hours. HbA1C: No results for input(s): HGBA1C in the last 72  hours. CBG: No results for input(s): GLUCAP in the last 168 hours. Lipid Profile: No results for input(s): CHOL, HDL, LDLCALC, TRIG, CHOLHDL, LDLDIRECT in the last 72 hours. Thyroid Function Tests: No results for input(s): TSH, T4TOTAL, FREET4, T3FREE, THYROIDAB in the last 72 hours. Anemia Panel: No results for input(s): VITAMINB12, FOLATE, FERRITIN, TIBC, IRON, RETICCTPCT in the last 72 hours. Sepsis Labs: Recent Labs  Lab 04/04/21 0622 04/04/21 1648  LATICACIDVEN 0.9 1.4     Recent Results (from the past 240 hour(s))  Urine Culture     Status: Abnormal (Preliminary result)   Collection Time: 04/04/21  6:18 AM   Specimen: In/Out Cath Urine  Result Value Ref Range Status   Specimen Description IN/OUT CATH URINE  Final   Special Requests NONE  Final   Culture (A)  Final    >=100,000 COLONIES/mL ESCHERICHIA COLI SUSCEPTIBILITIES TO FOLLOW Performed at [facility], [place] [address] [place], [place] [address]    Report Status PENDING  Incomplete  Resp Panel by RT-PCR (Flu A&B, Covid) Nasopharyngeal Swab     Status: None   Collection Time: 04/04/21  6:19 AM   Specimen: Nasopharyngeal Swab; Nasopharyngeal(NP) swabs in vial transport medium  Result Value Ref Range Status   SARS Coronavirus 2 by RT PCR NEGATIVE NEGATIVE Final    Comment: (NOTE) SARS-CoV-2 target nucleic acids are NOT DETECTED.  The SARS-CoV-2 RNA is generally detectable in upper respiratory specimens during the acute phase of infection. The lowest concentration of SARS-CoV-2 viral copies this assay can detect is 138 copies/mL. A negative result does not preclude SARS-Cov-2 infection and should not be used as the sole basis for treatment or other patient management decisions. A negative result may occur with  improper specimen collection/handling, submission of specimen other than nasopharyngeal swab, presence of viral mutation(s) within the areas targeted by this assay, and inadequate number of  viral copies(<138 copies/mL). A negative result must be combined with clinical observations, patient history, and epidemiological information. The expected result is Negative.  Fact Sheet for Patients:  EntrepreneurPulse.com.au  Fact Sheet for Healthcare Providers:  IncredibleEmployment.be  This test is no t yet approved or cleared by the [place] FDA and  has been authorized for detection and/or diagnosis of SARS-CoV-2 by FDA under an Emergency Use Authorization (EUA). This EUA will remain  in effect (meaning this test can be used) for the duration of the COVID-19 declaration under Section 564(b)(1) of the Act, 21 U.S.C.section 360bbb-3(b)(1), unless the authorization is terminated  or revoked sooner.       Influenza A by PCR NEGATIVE NEGATIVE Final   Influenza B by  PCR NEGATIVE NEGATIVE Final    Comment: (NOTE) The Xpert Xpress SARS-CoV-2/FLU/RSV plus assay is intended as an aid in the diagnosis of influenza from Nasopharyngeal swab specimens and should not be used as a sole basis for treatment. Nasal washings and aspirates are unacceptable for Xpert Xpress SARS-CoV-2/FLU/RSV testing.  Fact Sheet for Patients: EntrepreneurPulse.com.au  Fact Sheet for Healthcare Providers: IncredibleEmployment.be  This test is not yet approved or cleared by the [place] FDA and has been authorized for detection and/or diagnosis of SARS-CoV-2 by FDA under an Emergency Use Authorization (EUA). This EUA will remain in effect (meaning this test can be used) for the duration of the COVID-19 declaration under Section 564(b)(1) of the Act, 21 U.S.C. section 360bbb-3(b)(1), unless the authorization is terminated or revoked.  Performed at [facility], [place] [address], [place], [place] [address]   Culture, blood (Routine x 2)     Status: Abnormal (Preliminary result)   Collection Time: 04/04/21  6:22 AM    Specimen: BLOOD LEFT ARM  Result Value Ref Range Status   Specimen Description BLOOD LEFT ARM  Final   Special Requests   Final    BOTTLES DRAWN AEROBIC AND ANAEROBIC Blood Culture results may not be optimal due to an excessive volume of blood received in culture bottles   Culture  Setup Time   Final    GRAM NEGATIVE RODS ANAEROBIC BOTTLE ONLY CRITICAL RESULT CALLED TO, READ BACK BY AND VERIFIED WITH: K,HURTH PHARMD @2018  04/04/21 EB IN BOTH AEROBIC AND ANAEROBIC BOTTLES    Culture (A)  Final    ESCHERICHIA COLI SUSCEPTIBILITIES TO FOLLOW Performed at [facility], [place] [address] [place], [place] [address]    Report Status PENDING  Incomplete  Culture, blood (Routine x 2)     Status: Abnormal (Preliminary result)   Collection Time: 04/04/21  6:22 AM   Specimen: BLOOD RIGHT ARM  Result Value Ref Range Status   Specimen Description BLOOD RIGHT ARM  Final   Special Requests   Final    AEROBIC BOTTLE ONLY Blood Culture results may not be optimal due to an excessive volume of blood received in culture bottles   Culture  Setup Time   Final    GRAM NEGATIVE RODS AEROBIC BOTTLE ONLY CRITICAL VALUE NOTED.  VALUE IS CONSISTENT WITH PREVIOUSLY REPORTED AND CALLED VALUE.    Culture (A)  Final    ESCHERICHIA COLI CULTURE REINCUBATED FOR BETTER GROWTH Performed at [facility], [place] [address] [place], [place] [address]    Report Status PENDING  Incomplete  Blood Culture ID Panel (Reflexed)     Status: Abnormal   Collection Time: 04/04/21  6:22 AM  Result Value Ref Range Status   Enterococcus faecalis NOT DETECTED NOT DETECTED Final   Enterococcus Faecium NOT DETECTED NOT DETECTED Final   Listeria monocytogenes NOT DETECTED NOT DETECTED Final   Staphylococcus species NOT DETECTED NOT DETECTED Final   Staphylococcus aureus (BCID) NOT DETECTED NOT DETECTED Final   Staphylococcus epidermidis NOT DETECTED NOT DETECTED Final   Staphylococcus lugdunensis NOT DETECTED NOT DETECTED  Final   Streptococcus species NOT DETECTED NOT DETECTED Final   Streptococcus agalactiae NOT DETECTED NOT DETECTED Final   Streptococcus pneumoniae NOT DETECTED NOT DETECTED Final   Streptococcus pyogenes NOT DETECTED NOT DETECTED Final   A.calcoaceticus-baumannii NOT DETECTED NOT DETECTED Final   Bacteroides fragilis NOT DETECTED NOT DETECTED Final   Enterobacterales DETECTED (A) NOT DETECTED Final    Comment: Enterobacterales represent  a large order of gram negative bacteria, not a single organism. CRITICAL RESULT CALLED TO, READ BACK BY AND VERIFIED WITH: K,HURTH PHARMD @2018  04/04/21 EB    Enterobacter cloacae complex NOT DETECTED NOT DETECTED Final   Escherichia coli DETECTED (A) NOT DETECTED Final    Comment: CRITICAL RESULT CALLED TO, READ BACK BY AND VERIFIED WITH: K,HURTH PHARMD @2018  04/04/21 EB    Klebsiella aerogenes NOT DETECTED NOT DETECTED Final   Klebsiella oxytoca NOT DETECTED NOT DETECTED Final   Klebsiella pneumoniae NOT DETECTED NOT DETECTED Final   Proteus species NOT DETECTED NOT DETECTED Final   Salmonella species NOT DETECTED NOT DETECTED Final   Serratia marcescens NOT DETECTED NOT DETECTED Final   Haemophilus influenzae NOT DETECTED NOT DETECTED Final   Neisseria meningitidis NOT DETECTED NOT DETECTED Final   Pseudomonas aeruginosa NOT DETECTED NOT DETECTED Final   Stenotrophomonas maltophilia NOT DETECTED NOT DETECTED Final   Candida albicans NOT DETECTED NOT DETECTED Final   Candida auris NOT DETECTED NOT DETECTED Final   Candida glabrata NOT DETECTED NOT DETECTED Final   Candida krusei NOT DETECTED NOT DETECTED Final   Candida parapsilosis NOT DETECTED NOT DETECTED Final   Candida tropicalis NOT DETECTED NOT DETECTED Final   Cryptococcus neoformans/gattii NOT DETECTED NOT DETECTED Final   CTX-M ESBL NOT DETECTED NOT DETECTED Final   Carbapenem resistance IMP NOT DETECTED NOT DETECTED Final   Carbapenem resistance KPC NOT DETECTED NOT DETECTED Final    Carbapenem resistance NDM NOT DETECTED NOT DETECTED Final   Carbapenem resist OXA 48 LIKE NOT DETECTED NOT DETECTED Final   Carbapenem resistance VIM NOT DETECTED NOT DETECTED Final    Comment: Performed at [facility], [address], [place], [place] [address]          Radiology Studies: No results found.      Scheduled Meds:  acidophilus  2 capsule Oral TID   aspirin  81 mg Oral Daily   calcium-vitamin D  1 tablet Oral Daily   DULoxetine  60 mg Oral BID   enoxaparin (LOVENOX) injection  40 mg Subcutaneous [id number]   folic acid  1 mg Oral Daily   meloxicam  15 mg Oral Daily   metoprolol succinate  12.5 mg Oral BID   Ospemifene  1 tablet Oral Daily   pantoprazole  40 mg Oral Daily   Continuous Infusions:  sodium chloride 1,000 mL (04/06/21 0637)   cefTRIAXone (ROCEPHIN)  IV 2 g (04/05/21 2158)     LOS: 1 day   Time spent: 64min  Brigetta Beckstrom C Siearra Amberg, DO [facility]  If 7PM-7AM, please contact night-coverage [email or web address]  04/06/2021, 7:20 AM

## 2021-04-07 DIAGNOSIS — I10 Essential (primary) hypertension: Secondary | ICD-10-CM

## 2021-04-07 DIAGNOSIS — B9620 Unspecified Escherichia coli [E. coli] as the cause of diseases classified elsewhere: Secondary | ICD-10-CM

## 2021-04-07 DIAGNOSIS — R6520 Severe sepsis without septic shock: Secondary | ICD-10-CM

## 2021-04-07 DIAGNOSIS — I472 Ventricular tachycardia, unspecified: Secondary | ICD-10-CM

## 2021-04-07 DIAGNOSIS — R7881 Bacteremia: Secondary | ICD-10-CM

## 2021-04-07 DIAGNOSIS — A4151 Sepsis due to Escherichia coli [E. coli]: Principal | ICD-10-CM

## 2021-04-07 DIAGNOSIS — G9341 Metabolic encephalopathy: Secondary | ICD-10-CM

## 2021-04-07 MED ORDER — AMOXICILLIN-POT CLAVULANATE 875-125 MG PO TABS: 1.0000 | ORAL_TABLET | Freq: Two times a day (BID) | ORAL | 0 refills | Status: AC

## 2021-04-07 NOTE — Plan of Care (Signed)

## 2021-04-07 NOTE — TOC Transition Note (Signed)
Transition of Care [facility]) - CM/SW Discharge Note   Patient Details  Name: Kelsey Diaz MRN: [id number] Date of Birth: 1945/06/08  [facility] CM/SW Contact:  Tom-Johnson, Renea Ee, RN Phone Number: 04/07/2021, 11:18 AM   Clinical Narrative:    Patient is scheduled for discharge today. CM spoke with patient at bedside about needs for post hospital transition. Admitted for Sepsis d/t Ecoli. From home with daughter. Has a cane, walker and shower seat at home. PT recommending home health and bedside commode. CM gave patient list of agencies from Medicare.gov and she chose [place]. Referral made with [facility] and acceptance voiced. Bedside commode delivered at bedside by Freda Munro with [employer]. Denies any other needs. Daughter transporting at discharge. No further TOC needs noted.   Final next level of care: [place] Barriers to Discharge: Barriers Resolved   Patient Goals and CMS Choice Patient states their goals for this hospitalization and ongoing recovery are:: To return home CMS Medicare.gov Compare Post Acute Care list provided to:: Patient Choice offered to / list presented to : Patient  Discharge Placement                       Discharge Plan and Services                DME Arranged: 3-N-1   Date DME Agency Contacted: 04/07/21 Time DME Agency Contacted: [id number] Representative spoke with at DME Agency: Freda Munro HH Arranged: PT [place]: [place] Date [place]: 04/07/21 Time [place]: 78 Representative spoke with at [place]: [place] ([place]) Interventions     Readmission Risk Interventions No flowsheet data found.

## 2021-04-07 NOTE — Progress Notes (Signed)
Physical Therapy Treatment Patient Details Name: Kelsey Diaz MRN: [id number] DOB: [birth year] Today's Date: 04/07/2021   History of Present Illness [name] is a 76 y.o. female with medical history significant of RA on Etanercept, PVCs/VT s/p ablation, COPD, HTN, frequent UTIs, anxiety/depression, OSA not tolerated CPAP, came with urinary frequency, dysuria suprapubic abdominal pain and fever.    PT Comments    Continuing work on functional mobility and activity tolerance;  Pt initially voiced frustration with her experience of lack of information related to her discharge today -- assisted pt with getting dressed and provided active listening; Able to walk the hallway with supervision/minguard assist; Discussed benefits of getting home, and plan for HHPT  Recommendations for follow up therapy are one component of a multi-disciplinary discharge planning process, led by the attending physician.  Recommendations may be updated based on patient status, additional functional criteria and insurance authorization.  Follow Up Recommendations  Home health PT     Assistance Recommended at Discharge Set up Supervision/Assistance  Patient can return home with the following Assistance with cooking/housework   Equipment Recommendations  BSC/3in1    Recommendations for Other Services       Precautions / Restrictions Precautions Precautions: Fall Precaution Comments: fall risk significantly lessended with use of RW     Mobility  Bed Mobility                    Transfers Overall transfer level: Needs assistance Equipment used: Rolling walker (2 wheels) Transfers: Sit to/from Stand Sit to Stand: Min guard           General transfer comment: Min assist to steady RW; Cues for hand placement and anterior weight shift     Ambulation/Gait Ambulation/Gait assistance: Min guard Gait Distance (Feet): 120 Feet Assistive device: Rolling walker (2 wheels) Gait  Pattern/deviations: Step-through pattern, Decreased step length - right, Decreased step length - left       General Gait Details: Cues to self-monitor for activity tolerance    Stairs             Wheelchair Mobility    Modified Rankin (Stroke Patients Only)       Balance             Standing balance-Leahy Scale: Fair                              Cognition Arousal/Alertness: Awake/alert Behavior During Therapy: WFL for tasks assessed/performed Overall Cognitive Status: Within Functional Limits for tasks assessed                                 General Comments: Pt frustrated with the way in which dc today was communicated to her        Exercises      General Comments General comments (skin integrity, edema, etc.): Assisted pt to get dressed and listened to her frustrations      Pertinent Vitals/Pain Pain Assessment Pain Assessment: No/denies pain    Home Living                          Prior Function            PT Goals (current goals can now be found in the care plan section) Acute Rehab PT Goals Patient Stated Goal: Home soon PT Goal Formulation:  With patient Time For Goal Achievement: 04/19/21 Potential to Achieve Goals: Good Progress towards PT goals: Progressing toward goals    Frequency    Min 3X/week      PT Plan Current plan remains appropriate    Co-evaluation              AM-PAC PT "6 Clicks" Mobility   Outcome Measure  Help needed turning from your back to your side while in a flat bed without using bedrails?: None Help needed moving from lying on your back to sitting on the side of a flat bed without using bedrails?: None Help needed moving to and from a bed to a chair (including a wheelchair)?: A Little Help needed standing up from a chair using your arms (e.g., wheelchair or bedside chair)?: A Little Help needed to walk in hospital room?: A Little Help needed climbing 3-5  steps with a railing? : A Little 6 Click Score: 20    End of Session Equipment Utilized During Treatment: Gait belt Activity Tolerance: Patient tolerated treatment well Patient left: in chair;with call bell/phone within reach Nurse Communication: Mobility status PT Visit Diagnosis: Unsteadiness on feet (R26.81);Muscle weakness (generalized) (M62.81)     Time: 4665-9935 PT Time Calculation (min) (ACUTE ONLY): 25 min  Charges:  $Gait Training: 8-22 mins $Therapeutic Activity: 8-22 mins                     Roney Marion, PT  Acute Rehabilitation Services Pager [phone] Office [place] 04/07/2021, 12:22 PM

## 2021-04-07 NOTE — Discharge Summary (Signed)
Physician Discharge Summary  Kelsey Diaz JTT:[id number] DOB: Aug 22, 1945 DOA: 04/04/2021  PCP: Kelsey Stains, MD  Admit date: 04/04/2021 Discharge date: 04/07/2021  Admitted From: Home Disposition: Home  Recommendations for Outpatient Follow-up:  Follow up with PCP in 1-2 weeks Please obtain BMP/CBC in one week  Home Health: PT Equipment/Devices: None  Discharge Condition: Stable CODE STATUS: Full Diet recommendation: Low-salt low-fat diet  Brief/Interim Summary: Kelsey Diaz is a 76 y.o. female with medical history significant of RA on Etanercept, PVCs/VT s/p ablation, COPD, HTN, frequent UTIs, anxiety, depression, OSA not tolerated CPAP, came with urinary frequency, dysuria suprapubic abdominal pain and fever. Found to have UTI at intake, admitted for IV antibiotics.  Patient admitted as above noted to have sepsis secondary to E. coli UTI with subsequent blood cultures showing diffuse bacteremia as well.  Patient tolerated transition from IV to p.o. antibiotics quite well sepsis criteria resolved patient is otherwise improving drastically tolerating p.o. ambulating without difficulty.  PT evaluated due to profound weakness with infection and is recommending home health with PT in the interim.  Otherwise patient stable and agreeable for discharge home close follow-up with PCP.  Lengthy discussion about need to finish antibiotic course and to monitor symptoms in the future very closely given her recurrent episodes of UTI.  Her family was also educated at bedside.   Assessment & Plan:   Principal Problem:   Sepsis due to Escherichia coli (E. coli) (HCC) Active Problems:   HTN (hypertension)   Ventricular tachycardia   E-coli UTI   Bacteremia     Sepsis (POA) secondary to:  Complicated Ecoli UTI, acute, POA Concurrent Ecoli Bacteremia, POA - Immune compromised for she is on Etanercept - Continue supportive care -Transition to Augmentin at discharge for additional 5 days  of treatment   General weakness/deconditioning -Secondary to above -PT/OT to follow -recommending home health PT   Hypokalemia -Resolved, follow outpatient labs   Hyperglycemia -No history of diabetes, follow-up with PCP -Previous A1c 6.1 -lengthy discussion for diet control at bedside   Hypovolemic hyponatremia -Mild   Hx of VT -Electrolytes WNL  Discharge Diagnoses:  Principal Problem:   Sepsis due to Escherichia coli (E. coli) ([facility]) Active Problems:   HTN (hypertension)   Ventricular tachycardia   E-coli UTI   Bacteremia   UTI (urinary tract infection)    Discharge Instructions   Allergies as of 04/07/2021       Reactions   Morphine Other (See Comments)   Stopped heart   Advair Diskus [fluticasone-salmeterol]    Sensitivity to smells   Ambien [zolpidem Tartrate]    irritable   Amlodipine Besylate    Feels bad   Azithromycin    Other reaction(s): Unknown   Bextra [valdecoxib]    Stomach issues   Bupropion    Other reaction(s): involuntary muscle movements   Captopril    Ache   Irbesartan    Other reaction(s): dizzy   Latex Itching   Bandaids only   Lisinopril    cough   Pacerone [amiodarone] Other (See Comments)   falls   Pulmicort [budesonide]    Worse breathing, sensitivity to smells   Savella [milnacipran Hcl]    Mental fog   Spiriva [tiotropium Bromide Monohydrate]    Dry mouth   Suvorexant    Other reaction(s): not effective   Tiotropium Bromide Monohydrate    Other reaction(s): dry mouth        Medication List     STOP taking these medications  methotrexate 2.5 MG tablet       TAKE these medications    Accu-Chek Guide Me w/Device Kit   Accu-Chek Guide test strip Generic drug: glucose blood   Accu-Chek Softclix Lancets lancets   albuterol 108 (90 Base) MCG/ACT inhaler Commonly known as: VENTOLIN HFA Inhale 2 puffs into the lungs every 6 (six) hours as needed for shortness of breath.   amoxicillin-clavulanate  875-125 MG tablet Commonly known as: Augmentin Take 1 tablet by mouth 2 (two) times daily for 5 days.   aspirin 81 MG chewable tablet Chew 81 mg by mouth daily.   Calcium Carbonate-Vitamin D 600-200 MG-UNIT Tabs Take 1 tablet by mouth daily.   Clobetasol Prop Emollient Base 0.05 % emollient cream Apply 1 application topically 2 (two) times daily.   DULoxetine 60 MG capsule Commonly known as: CYMBALTA Take 60 mg by mouth 2 (two) times daily.   Enbrel 50 MG/ML injection Generic drug: etanercept Inject 50 mg into the skin once a week. Friday   FISH OIL PO Take 1,000 mg by mouth daily.   fluocinonide 0.05 % external solution Commonly known as: LIDEX Apply 1 application topically 2 (two) times daily as needed (skin irritation).   fluticasone 50 MCG/ACT nasal spray Commonly known as: FLONASE 2 sprays daily as needed for allergies.   folic acid 1 MG tablet Commonly known as: FOLVITE Take 1 tablet by mouth daily.   HYDROcodone-acetaminophen 7.5-325 MG tablet Commonly known as: NORCO Take 1 tablet by mouth every 4 (four) hours as needed for moderate pain or severe pain. Start taking on: April 21, 2021   meloxicam 15 MG tablet Commonly known as: MOBIC Take 15 mg by mouth daily.   metoprolol succinate 50 MG 24 hr tablet Commonly known as: TOPROL-XL Take 1 tablet (50 mg total) by mouth 2 (two) times daily. Take with or immediately following a meal. What changed: how much to take   MULTI-VITAMIN PO Take 1 tablet by mouth daily.   nystatin powder Commonly known as: MYCOSTATIN/NYSTOP 1 application daily as needed (skin irritation).   omeprazole 40 MG capsule Commonly known as: PRILOSEC Take 40 mg by mouth daily.   Osphena 60 MG Tabs Generic drug: Ospemifene Take 1 tablet by mouth daily.   rosuvastatin 20 MG tablet Commonly known as: CRESTOR Take 20 mg by mouth once a week.        Follow-up Information     [facility] Follow up.   Specialty:  Home Health Services Why: Someone will call you to schedule first home visit. Contact information: [address] [place] [address] [phone]                Allergies  Allergen Reactions   Morphine Other (See Comments)    Stopped heart   Advair Diskus [Fluticasone-Salmeterol]     Sensitivity to smells   Ambien [Zolpidem Tartrate]     irritable   Amlodipine Besylate     Feels bad   Azithromycin     Other reaction(s): Unknown   Bextra [Valdecoxib]     Stomach issues   Bupropion     Other reaction(s): involuntary muscle movements   Captopril     Ache   Irbesartan     Other reaction(s): dizzy   Latex Itching    Bandaids only   Lisinopril     cough   Pacerone [Amiodarone] Other (See Comments)    falls   Pulmicort [Budesonide]     Worse breathing, sensitivity  to smells   Elwyn Reach Hcl]     Mental fog   Spiriva [Tiotropium Bromide Monohydrate]     Dry mouth   Suvorexant     Other reaction(s): not effective   Tiotropium Bromide Monohydrate     Other reaction(s): dry mouth    Consultations: None  Procedures/Studies: DG Chest 2 View  Result Date: 04/04/2021 CLINICAL DATA:  76 year old female with history of suspected sepsis. EXAM: CHEST - 2 VIEW COMPARISON:  Chest x-ray 12/11/2020. FINDINGS: Lung volumes are normal. No consolidative airspace disease. No pleural effusions. No pneumothorax. No pulmonary nodule or mass noted. Pulmonary vasculature and the cardiomediastinal silhouette are within normal limits. Atherosclerosis in the thoracic aorta. IMPRESSION: 1.  No radiographic evidence of acute cardiopulmonary disease. 2. Aortic atherosclerosis. Electronically Signed   By: Vinnie Langton M.D.   On: 04/04/2021 06:42   MR BRAIN WO CONTRAST  Result Date: 03/30/2021 CLINICAL DATA:  Gait instability. Additional history provided by scanning technologist: Patient reports falls, trembling, bilateral leg weakness, difficulty "getting thoughts  out." EXAM: MRI HEAD WITHOUT CONTRAST TECHNIQUE: Multiplanar, multiecho pulse sequences of the brain and surrounding structures were obtained without intravenous contrast. COMPARISON:  MRI brain and MRA head 03/04/2011. FINDINGS: Brain: Mild intermittent motion degradation. For age minimal generalized cerebral and cerebellar atrophy, not unexpected for age. 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 Moderate multifocal T2 FLAIR hyperintense signal abnormality within the cerebral white matter and pons, nonspecific but compatible with chronic small vessel ischemic disease. Tiny chronic infarct within the left cerebellar hemisphere. There is no acute infarct. No evidence of an intracranial mass. No chronic intracranial blood products. No extra-axial fluid collection. No midline shift. Vascular: Maintained flow voids within the proximal large arterial vessels. Skull and upper cervical spine: No focal suspicious marrow lesion. Sinuses/Orbits: Visualized orbits show no acute finding. Bilateral ocular lens replacements. No significant paranasal sinus disease. IMPRESSION: Mildly motion degraded exam. No evidence of acute intracranial abnormality. Moderate chronic small vessel ischemic changes within the cerebral white matter and pons, progressed from the brain MRI of 03/04/2011. Tiny chronic infarct within the left cerebellar hemisphere, new from the prior MRI. 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 Correlate with the procedural history. Minimal generalized cerebral and cerebellar atrophy, not unexpected for age. Electronically Signed   By: Kellie Simmering D.O.   On: 03/30/2021 13:11   MR CERVICAL SPINE WO CONTRAST  Result Date: 03/31/2021 CLINICAL DATA:  76 year old female with gait instability. Lower extremity weakness. Tremor. EXAM: MRI CERVICAL SPINE WITHOUT CONTRAST TECHNIQUE:  Multiplanar, multisequence MR imaging of the cervical spine was performed. No intravenous contrast was administered. COMPARISON:  Brain MRI from the same day reported separately. Cervical spine radiographs 02/25/2021. FINDINGS: Alignment: Stable straightening of cervical lordosis with mild degenerative appearing anterolisthesis of C7 on T1. Vertebrae: No marrow edema or evidence of acute osseous abnormality. Visualized bone marrow signal is within normal limits. Cord: Normal.  Capacious spinal canal at most cervical levels. Posterior Fossa, vertebral arteries, paraspinal tissues: Cervicomedullary junction is within normal limits. Brain is detailed separately. Preserved major vascular flow voids in the neck. Partially retropharyngeal course of the left carotid. Negative visible neck soft tissues, lung apices. Disc levels: C2-C3: Minor disc bulging and right facet hypertrophy. No stenosis. C3-C4: Minor disc bulging. Right foraminal endplate spurring. Mild facet hypertrophy greater on the right. No spinal stenosis. Mild to moderate right C4 foraminal stenosis. C4-C5:  Subtle anterolisthesis and disc bulging. Moderate facet hypertrophy on the right, mild on the left. Mild ligament flavum hypertrophy. Capacious spinal canal with no spinal stenosis. Mild to moderate right C5 foraminal stenosis. C5-C6: Disc space loss with circumferential disc osteophyte complex. Ligament flavum hypertrophy. Effaced ventral CSF space but no significant spinal stenosis. Right foraminal disc osteophyte component with moderate right C6 foraminal stenosis. C6-C7: Disc space loss with circumferential disc osteophyte complex. Bilateral foraminal involvement. Mild ligament flavum hypertrophy. No significant spinal stenosis. Moderate bilateral C7 foraminal stenosis. C7-T1: Mild anterolisthesis with mild to moderate facet hypertrophy. No stenosis. No upper thoracic stenosis. IMPRESSION: 1. No acute osseous abnormality and generally mild for age  cervical spine degeneration with capacious spinal canal. No significant spinal stenosis. 2. Up to degenerative moderate neural foraminal stenosis at the right C4 through C6 and bilateral C7 nerve levels. Electronically Signed   By: Genevie Ann M.D.   On: 03/31/2021 09:19     Subjective: No acute issues or events overnight denies nausea vomiting diarrhea constipation headache fevers chills or chest pain.  Weakness markedly improving, not yet back to baseline, will be discharged with home health PT as above   Discharge Exam: Vitals:   04/06/21 2211 04/07/21 0430  BP: (!) 179/85 (!) 165/77  Pulse: 67 67  Resp: 20 19  Temp: 98.5 F (36.9 C) 97.8 F (36.6 C)  SpO2: 100% 94%   Vitals:   04/06/21 0428 04/06/21 0931 04/06/21 2211 04/07/21 0430  BP: (!) 174/82 (!) 175/80 (!) 179/85 (!) 165/77  Pulse: 72 71 67 67  Resp: $Remo'18 20 20 19  'Egsbo$ Temp: 98.1 F (36.7 C) (!) 97.2 F (36.2 C) 98.5 F (36.9 C) 97.8 F (36.6 C)  TempSrc: Oral  Oral Oral  SpO2: 97% 97% 100% 94%  Weight:      Height:        General: Pt is alert, awake, not in acute distress Cardiovascular: RRR, S1/S2 +, no rubs, no gallops Respiratory: CTA bilaterally, no wheezing, no rhonchi Abdominal: Soft, NT, ND, bowel sounds + Extremities: no edema, no cyanosis    The results of significant diagnostics from this hospitalization (including imaging, microbiology, ancillary and laboratory) are listed below for reference.     Microbiology: Recent Results (from the past 240 hour(s))  Urine Culture     Status: Abnormal   Collection Time: 04/04/21  6:18 AM   Specimen: In/Out Cath Urine  Result Value Ref Range Status   Specimen Description IN/OUT CATH URINE  Final   Special Requests   Final    NONE Performed at [facility], [address] [place], [place] [address]    Culture >=100,000 COLONIES/mL ESCHERICHIA COLI (A)  Final   Report Status 04/06/2021 FINAL  Final   Organism ID, Bacteria ESCHERICHIA COLI (A)  Final       Susceptibility   Escherichia coli - MIC*    AMPICILLIN 4 SENSITIVE Sensitive     CEFAZOLIN <=4 SENSITIVE Sensitive     CEFEPIME <=0.12 SENSITIVE Sensitive     CEFTRIAXONE <=0.25 SENSITIVE Sensitive     CIPROFLOXACIN <=0.25 SENSITIVE Sensitive     GENTAMICIN 2 SENSITIVE Sensitive     IMIPENEM <=0.25 SENSITIVE Sensitive     NITROFURANTOIN <=16 SENSITIVE Sensitive     TRIMETH/SULFA <=20 SENSITIVE Sensitive     AMPICILLIN/SULBACTAM 4 SENSITIVE Sensitive     PIP/TAZO <=4 SENSITIVE Sensitive     * >=100,000 COLONIES/mL ESCHERICHIA COLI  Resp Panel by RT-PCR (Flu A&B, Covid) Nasopharyngeal  Swab     Status: None   Collection Time: 04/04/21  6:19 AM   Specimen: Nasopharyngeal Swab; Nasopharyngeal(NP) swabs in vial transport medium  Result Value Ref Range Status   SARS Coronavirus 2 by RT PCR NEGATIVE NEGATIVE Final    Comment: (NOTE) SARS-CoV-2 target nucleic acids are NOT DETECTED.  The SARS-CoV-2 RNA is generally detectable in upper respiratory specimens during the acute phase of infection. The lowest concentration of SARS-CoV-2 viral copies this assay can detect is 138 copies/mL. A negative result does not preclude SARS-Cov-2 infection and should not be used as the sole basis for treatment or other patient management decisions. A negative result may occur with  improper specimen collection/handling, submission of specimen other than nasopharyngeal swab, presence of viral mutation(s) within the areas targeted by this assay, and inadequate number of viral copies(<138 copies/mL). A negative result must be combined with clinical observations, patient history, and epidemiological information. The expected result is Negative.  Fact Sheet for Patients:  BloggerCourse.com  Fact Sheet for Healthcare Providers:  SeriousBroker.it  This test is no t yet approved or cleared by the [place] FDA and  has been authorized for detection and/or  diagnosis of SARS-CoV-2 by FDA under an Emergency Use Authorization (EUA). This EUA will remain  in effect (meaning this test can be used) for the duration of the COVID-19 declaration under Section 564(b)(1) of the Act, 21 U.S.C.section 360bbb-3(b)(1), unless the authorization is terminated  or revoked sooner.       Influenza A by PCR NEGATIVE NEGATIVE Final   Influenza B by PCR NEGATIVE NEGATIVE Final    Comment: (NOTE) The Xpert Xpress SARS-CoV-2/FLU/RSV plus assay is intended as an aid in the diagnosis of influenza from Nasopharyngeal swab specimens and should not be used as a sole basis for treatment. Nasal washings and aspirates are unacceptable for Xpert Xpress SARS-CoV-2/FLU/RSV testing.  Fact Sheet for Patients: BloggerCourse.com  Fact Sheet for Healthcare Providers: SeriousBroker.it  This test is not yet approved or cleared by the [place] FDA and has been authorized for detection and/or diagnosis of SARS-CoV-2 by FDA under an Emergency Use Authorization (EUA). This EUA will remain in effect (meaning this test can be used) for the duration of the COVID-19 declaration under Section 564(b)(1) of the Act, 21 U.S.C. section 360bbb-3(b)(1), unless the authorization is terminated or revoked.  Performed at [facility], [address] [place], [place] [address]   Culture, blood (Routine x 2)     Status: Abnormal   Collection Time: 04/04/21  6:22 AM   Specimen: BLOOD LEFT ARM  Result Value Ref Range Status   Specimen Description BLOOD LEFT ARM  Final   Special Requests   Final    BOTTLES DRAWN AEROBIC AND ANAEROBIC Blood Culture results may not be optimal due to an excessive volume of blood received in culture bottles   Culture  Setup Time   Final    GRAM NEGATIVE RODS ANAEROBIC BOTTLE ONLY CRITICAL RESULT CALLED TO, READ BACK BY AND VERIFIED WITH: K,HURTH PHARMD @2018  04/04/21 EB IN BOTH AEROBIC AND ANAEROBIC  BOTTLES Performed at [facility], [address] [place], [place]    Culture ESCHERICHIA COLI (A)  Final   Report Status 04/06/2021 FINAL  Final   Organism ID, Bacteria ESCHERICHIA COLI  Final      Susceptibility   Escherichia coli - MIC*    AMPICILLIN 4 SENSITIVE Sensitive     CEFAZOLIN <=4 SENSITIVE Sensitive  CEFEPIME <=0.12 SENSITIVE Sensitive     CEFTAZIDIME <=1 SENSITIVE Sensitive     CEFTRIAXONE <=0.25 SENSITIVE Sensitive     CIPROFLOXACIN <=0.25 SENSITIVE Sensitive     GENTAMICIN <=1 SENSITIVE Sensitive     IMIPENEM <=0.25 SENSITIVE Sensitive     TRIMETH/SULFA <=20 SENSITIVE Sensitive     AMPICILLIN/SULBACTAM <=2 SENSITIVE Sensitive     PIP/TAZO <=4 SENSITIVE Sensitive     * ESCHERICHIA COLI  Culture, blood (Routine x 2)     Status: Abnormal   Collection Time: 04/04/21  6:22 AM   Specimen: BLOOD RIGHT ARM  Result Value Ref Range Status   Specimen Description BLOOD RIGHT ARM  Final   Special Requests   Final    AEROBIC BOTTLE ONLY Blood Culture results may not be optimal due to an excessive volume of blood received in culture bottles   Culture  Setup Time   Final    GRAM NEGATIVE RODS AEROBIC BOTTLE ONLY CRITICAL VALUE NOTED.  VALUE IS CONSISTENT WITH PREVIOUSLY REPORTED AND CALLED VALUE.    Culture (A)  Final    ESCHERICHIA COLI SUSCEPTIBILITIES PERFORMED ON PREVIOUS CULTURE WITHIN THE LAST 5 DAYS. Performed at [facility], [place] [address] [place], [place] [address]    Report Status 04/06/2021 FINAL  Final  Blood Culture ID Panel (Reflexed)     Status: Abnormal   Collection Time: 04/04/21  6:22 AM  Result Value Ref Range Status   Enterococcus faecalis NOT DETECTED NOT DETECTED Final   Enterococcus Faecium NOT DETECTED NOT DETECTED Final   Listeria monocytogenes NOT DETECTED NOT DETECTED Final   Staphylococcus species NOT DETECTED NOT DETECTED Final   Staphylococcus aureus (BCID) NOT DETECTED NOT DETECTED Final   Staphylococcus  epidermidis NOT DETECTED NOT DETECTED Final   Staphylococcus lugdunensis NOT DETECTED NOT DETECTED Final   Streptococcus species NOT DETECTED NOT DETECTED Final   Streptococcus agalactiae NOT DETECTED NOT DETECTED Final   Streptococcus pneumoniae NOT DETECTED NOT DETECTED Final   Streptococcus pyogenes NOT DETECTED NOT DETECTED Final   A.calcoaceticus-baumannii NOT DETECTED NOT DETECTED Final   Bacteroides fragilis NOT DETECTED NOT DETECTED Final   Enterobacterales DETECTED (A) NOT DETECTED Final    Comment: Enterobacterales represent a large order of gram negative bacteria, not a single organism. CRITICAL RESULT CALLED TO, READ BACK BY AND VERIFIED WITH: K,HURTH PHARMD $RemoveBefore'@2018'[email or web address]$  04/04/21 EB    Enterobacter cloacae complex NOT DETECTED NOT DETECTED Final   Escherichia coli DETECTED (A) NOT DETECTED Final    Comment: CRITICAL RESULT CALLED TO, READ BACK BY AND VERIFIED WITH: K,HURTH PHARMD $RemoveBefore'@2018'[email or web address]$  04/04/21 EB    Klebsiella aerogenes NOT DETECTED NOT DETECTED Final   Klebsiella oxytoca NOT DETECTED NOT DETECTED Final   Klebsiella pneumoniae NOT DETECTED NOT DETECTED Final   Proteus species NOT DETECTED NOT DETECTED Final   Salmonella species NOT DETECTED NOT DETECTED Final   Serratia marcescens NOT DETECTED NOT DETECTED Final   Haemophilus influenzae NOT DETECTED NOT DETECTED Final   Neisseria meningitidis NOT DETECTED NOT DETECTED Final   Pseudomonas aeruginosa NOT DETECTED NOT DETECTED Final   Stenotrophomonas maltophilia NOT DETECTED NOT DETECTED Final   Candida albicans NOT DETECTED NOT DETECTED Final   Candida auris NOT DETECTED NOT DETECTED Final   Candida glabrata NOT DETECTED NOT DETECTED Final   Candida krusei NOT DETECTED NOT DETECTED Final   Candida parapsilosis NOT DETECTED NOT DETECTED Final   Candida tropicalis NOT DETECTED NOT DETECTED Final   Cryptococcus neoformans/gattii NOT DETECTED NOT DETECTED Final  CTX-M ESBL NOT DETECTED NOT DETECTED Final   Carbapenem resistance  IMP NOT DETECTED NOT DETECTED Final   Carbapenem resistance KPC NOT DETECTED NOT DETECTED Final   Carbapenem resistance NDM NOT DETECTED NOT DETECTED Final   Carbapenem resist OXA 48 LIKE NOT DETECTED NOT DETECTED Final   Carbapenem resistance VIM NOT DETECTED NOT DETECTED Final    Comment: Performed at [facility], [address], [place], [place] [address]     Labs: BNP (last 3 results) No results for input(s): BNP in the last 8760 hours. Basic Metabolic Panel: Recent Labs  Lab 04/04/21 0621 04/04/21 1648 04/05/21 0127  NA 132*  --  138  K 3.2*  --  3.7  CL 100  --  106  CO2 23  --  24  GLUCOSE 150*  --  116*  BUN 16  --  9  CREATININE 0.59  --  0.61  CALCIUM 8.5*  --  8.0*  MG  --  1.7  --   PHOS  --  2.4*  --    Liver Function Tests: Recent Labs  Lab 04/04/21 0621  AST 16  ALT 14  ALKPHOS 41  BILITOT 1.0  PROT 6.5  ALBUMIN 3.1*   No results for input(s): LIPASE, AMYLASE in the last 168 hours. No results for input(s): AMMONIA in the last 168 hours. CBC: Recent Labs  Lab 04/04/21 0621  WBC 8.6  NEUTROABS 6.6  HGB 13.0  HCT 38.2  MCV 91.4  PLT 149*   Cardiac Enzymes: No results for input(s): CKTOTAL, CKMB, CKMBINDEX, TROPONINI in the last 168 hours. BNP: Invalid input(s): POCBNP CBG: No results for input(s): GLUCAP in the last 168 hours. D-Dimer No results for input(s): DDIMER in the last 72 hours. Hgb A1c No results for input(s): HGBA1C in the last 72 hours. Lipid Profile No results for input(s): CHOL, HDL, LDLCALC, TRIG, CHOLHDL, LDLDIRECT in the last 72 hours. Thyroid function studies No results for input(s): TSH, T4TOTAL, T3FREE, THYROIDAB in the last 72 hours.  Invalid input(s): FREET3 Anemia work up No results for input(s): VITAMINB12, FOLATE, FERRITIN, TIBC, IRON, RETICCTPCT in the last 72 hours. Urinalysis    Component Value Date/Time   COLORURINE AMBER (A) 04/04/2021 0750   APPEARANCEUR CLOUDY (A) 04/04/2021 0750   LABSPEC  1.014 04/04/2021 0750   PHURINE 7.0 04/04/2021 0750   GLUCOSEU NEGATIVE 04/04/2021 0750   HGBUR NEGATIVE 04/04/2021 0750   BILIRUBINUR NEGATIVE 04/04/2021 0750   KETONESUR NEGATIVE 04/04/2021 0750   PROTEINUR NEGATIVE 04/04/2021 0750   UROBILINOGEN 0.2 08/12/2008 2131   NITRITE NEGATIVE 04/04/2021 0750   LEUKOCYTESUR MODERATE (A) 04/04/2021 0750   Sepsis Labs Invalid input(s): PROCALCITONIN,  WBC,  LACTICIDVEN Microbiology Recent Results (from the past 240 hour(s))  Urine Culture     Status: Abnormal   Collection Time: 04/04/21  6:18 AM   Specimen: In/Out Cath Urine  Result Value Ref Range Status   Specimen Description IN/OUT CATH URINE  Final   Special Requests   Final    NONE Performed at [facility], [address], [place], [place] [address]    Culture >=100,000 COLONIES/mL ESCHERICHIA COLI (A)  Final   Report Status 04/06/2021 FINAL  Final   Organism ID, Bacteria ESCHERICHIA COLI (A)  Final      Susceptibility   Escherichia coli - MIC*    AMPICILLIN 4 SENSITIVE Sensitive     CEFAZOLIN <=4 SENSITIVE Sensitive     CEFEPIME <=0.12 SENSITIVE Sensitive  CEFTRIAXONE <=0.25 SENSITIVE Sensitive     CIPROFLOXACIN <=0.25 SENSITIVE Sensitive     GENTAMICIN 2 SENSITIVE Sensitive     IMIPENEM <=0.25 SENSITIVE Sensitive     NITROFURANTOIN <=16 SENSITIVE Sensitive     TRIMETH/SULFA <=20 SENSITIVE Sensitive     AMPICILLIN/SULBACTAM 4 SENSITIVE Sensitive     PIP/TAZO <=4 SENSITIVE Sensitive     * >=100,000 COLONIES/mL ESCHERICHIA COLI  Resp Panel by RT-PCR (Flu A&B, Covid) Nasopharyngeal Swab     Status: None   Collection Time: 04/04/21  6:19 AM   Specimen: Nasopharyngeal Swab; Nasopharyngeal(NP) swabs in vial transport medium  Result Value Ref Range Status   SARS Coronavirus 2 by RT PCR NEGATIVE NEGATIVE Final    Comment: (NOTE) SARS-CoV-2 target nucleic acids are NOT DETECTED.  The SARS-CoV-2 RNA is generally detectable in upper respiratory specimens during the acute  phase of infection. The lowest concentration of SARS-CoV-2 viral copies this assay can detect is 138 copies/mL. A negative result does not preclude SARS-Cov-2 infection and should not be used as the sole basis for treatment or other patient management decisions. A negative result may occur with  improper specimen collection/handling, submission of specimen other than nasopharyngeal swab, presence of viral mutation(s) within the areas targeted by this assay, and inadequate number of viral copies(<138 copies/mL). A negative result must be combined with clinical observations, patient history, and epidemiological information. The expected result is Negative.  Fact Sheet for Patients:  EntrepreneurPulse.com.au  Fact Sheet for Healthcare Providers:  IncredibleEmployment.be  This test is no t yet approved or cleared by the [place] FDA and  has been authorized for detection and/or diagnosis of SARS-CoV-2 by FDA under an Emergency Use Authorization (EUA). This EUA will remain  in effect (meaning this test can be used) for the duration of the COVID-19 declaration under Section 564(b)(1) of the Act, 21 U.S.C.section 360bbb-3(b)(1), unless the authorization is terminated  or revoked sooner.       Influenza A by PCR NEGATIVE NEGATIVE Final   Influenza B by PCR NEGATIVE NEGATIVE Final    Comment: (NOTE) The Xpert Xpress SARS-CoV-2/FLU/RSV plus assay is intended as an aid in the diagnosis of influenza from Nasopharyngeal swab specimens and should not be used as a sole basis for treatment. Nasal washings and aspirates are unacceptable for Xpert Xpress SARS-CoV-2/FLU/RSV testing.  Fact Sheet for Patients: EntrepreneurPulse.com.au  Fact Sheet for Healthcare Providers: IncredibleEmployment.be  This test is not yet approved or cleared by the [place] FDA and has been authorized for detection and/or diagnosis of  SARS-CoV-2 by FDA under an Emergency Use Authorization (EUA). This EUA will remain in effect (meaning this test can be used) for the duration of the COVID-19 declaration under Section 564(b)(1) of the Act, 21 U.S.C. section 360bbb-3(b)(1), unless the authorization is terminated or revoked.  Performed at [facility], [place] [address], [place], [place] [address]   Culture, blood (Routine x 2)     Status: Abnormal   Collection Time: 04/04/21  6:22 AM   Specimen: BLOOD LEFT ARM  Result Value Ref Range Status   Specimen Description BLOOD LEFT ARM  Final   Special Requests   Final    BOTTLES DRAWN AEROBIC AND ANAEROBIC Blood Culture results may not be optimal due to an excessive volume of blood received in culture bottles   Culture  Setup Time   Final    GRAM NEGATIVE RODS ANAEROBIC BOTTLE ONLY CRITICAL RESULT CALLED TO, READ BACK BY AND VERIFIED WITH: [place] @  2018 04/04/21 EB IN BOTH AEROBIC AND ANAEROBIC BOTTLES Performed at [facility], [place] [address] [place], [place] [address]    Culture ESCHERICHIA COLI (A)  Final   Report Status 04/06/2021 FINAL  Final   Organism ID, Bacteria ESCHERICHIA COLI  Final      Susceptibility   Escherichia coli - MIC*    AMPICILLIN 4 SENSITIVE Sensitive     CEFAZOLIN <=4 SENSITIVE Sensitive     CEFEPIME <=0.12 SENSITIVE Sensitive     CEFTAZIDIME <=1 SENSITIVE Sensitive     CEFTRIAXONE <=0.25 SENSITIVE Sensitive     CIPROFLOXACIN <=0.25 SENSITIVE Sensitive     GENTAMICIN <=1 SENSITIVE Sensitive     IMIPENEM <=0.25 SENSITIVE Sensitive     TRIMETH/SULFA <=20 SENSITIVE Sensitive     AMPICILLIN/SULBACTAM <=2 SENSITIVE Sensitive     PIP/TAZO <=4 SENSITIVE Sensitive     * ESCHERICHIA COLI  Culture, blood (Routine x 2)     Status: Abnormal   Collection Time: 04/04/21  6:22 AM   Specimen: BLOOD RIGHT ARM  Result Value Ref Range Status   Specimen Description BLOOD RIGHT ARM  Final   Special Requests   Final    AEROBIC BOTTLE ONLY  Blood Culture results may not be optimal due to an excessive volume of blood received in culture bottles   Culture  Setup Time   Final    GRAM NEGATIVE RODS AEROBIC BOTTLE ONLY CRITICAL VALUE NOTED.  VALUE IS CONSISTENT WITH PREVIOUSLY REPORTED AND CALLED VALUE.    Culture (A)  Final    ESCHERICHIA COLI SUSCEPTIBILITIES PERFORMED ON PREVIOUS CULTURE WITHIN THE LAST 5 DAYS. Performed at [facility], [place] [address], [place], [place] [address]    Report Status 04/06/2021 FINAL  Final  Blood Culture ID Panel (Reflexed)     Status: Abnormal   Collection Time: 04/04/21  6:22 AM  Result Value Ref Range Status   Enterococcus faecalis NOT DETECTED NOT DETECTED Final   Enterococcus Faecium NOT DETECTED NOT DETECTED Final   Listeria monocytogenes NOT DETECTED NOT DETECTED Final   Staphylococcus species NOT DETECTED NOT DETECTED Final   Staphylococcus aureus (BCID) NOT DETECTED NOT DETECTED Final   Staphylococcus epidermidis NOT DETECTED NOT DETECTED Final   Staphylococcus lugdunensis NOT DETECTED NOT DETECTED Final   Streptococcus species NOT DETECTED NOT DETECTED Final   Streptococcus agalactiae NOT DETECTED NOT DETECTED Final   Streptococcus pneumoniae NOT DETECTED NOT DETECTED Final   Streptococcus pyogenes NOT DETECTED NOT DETECTED Final   A.calcoaceticus-baumannii NOT DETECTED NOT DETECTED Final   Bacteroides fragilis NOT DETECTED NOT DETECTED Final   Enterobacterales DETECTED (A) NOT DETECTED Final    Comment: Enterobacterales represent a large order of gram negative bacteria, not a single organism. CRITICAL RESULT CALLED TO, READ BACK BY AND VERIFIED WITH: K,HURTH PHARMD $RemoveBefore'@2018'[email or web address]$  04/04/21 EB    Enterobacter cloacae complex NOT DETECTED NOT DETECTED Final   Escherichia coli DETECTED (A) NOT DETECTED Final    Comment: CRITICAL RESULT CALLED TO, READ BACK BY AND VERIFIED WITH: K,HURTH PHARMD $RemoveBefore'@2018'[email or web address]$  04/04/21 EB    Klebsiella aerogenes NOT DETECTED NOT DETECTED Final   Klebsiella  oxytoca NOT DETECTED NOT DETECTED Final   Klebsiella pneumoniae NOT DETECTED NOT DETECTED Final   Proteus species NOT DETECTED NOT DETECTED Final   Salmonella species NOT DETECTED NOT DETECTED Final   Serratia marcescens NOT DETECTED NOT DETECTED Final   Haemophilus influenzae NOT DETECTED NOT DETECTED Final   Neisseria meningitidis NOT DETECTED NOT DETECTED Final  Pseudomonas aeruginosa NOT DETECTED NOT DETECTED Final   Stenotrophomonas maltophilia NOT DETECTED NOT DETECTED Final   Candida albicans NOT DETECTED NOT DETECTED Final   Candida auris NOT DETECTED NOT DETECTED Final   Candida glabrata NOT DETECTED NOT DETECTED Final   Candida krusei NOT DETECTED NOT DETECTED Final   Candida parapsilosis NOT DETECTED NOT DETECTED Final   Candida tropicalis NOT DETECTED NOT DETECTED Final   Cryptococcus neoformans/gattii NOT DETECTED NOT DETECTED Final   CTX-M ESBL NOT DETECTED NOT DETECTED Final   Carbapenem resistance IMP NOT DETECTED NOT DETECTED Final   Carbapenem resistance KPC NOT DETECTED NOT DETECTED Final   Carbapenem resistance NDM NOT DETECTED NOT DETECTED Final   Carbapenem resist OXA 48 LIKE NOT DETECTED NOT DETECTED Final   Carbapenem resistance VIM NOT DETECTED NOT DETECTED Final    Comment: Performed at [facility], [address], [place], [place] [address]     Time coordinating discharge: Over 30 minutes  SIGNED:   Little Ishikawa, DO [facility] 04/07/2021, 3:48 PM Pager   If 7PM-7AM, please contact night-coverage [email or web address]

## 2021-04-09 DIAGNOSIS — F324 Major depressive disorder, single episode, in partial remission: Secondary | ICD-10-CM | POA: Diagnosis not present

## 2021-04-09 DIAGNOSIS — E785 Hyperlipidemia, unspecified: Secondary | ICD-10-CM | POA: Diagnosis not present

## 2021-04-09 DIAGNOSIS — E1169 Type 2 diabetes mellitus with other specified complication: Secondary | ICD-10-CM | POA: Diagnosis not present

## 2021-04-09 DIAGNOSIS — I10 Essential (primary) hypertension: Secondary | ICD-10-CM | POA: Diagnosis not present

## 2021-04-09 DIAGNOSIS — J441 Chronic obstructive pulmonary disease with (acute) exacerbation: Secondary | ICD-10-CM | POA: Diagnosis not present

## 2021-04-11 ENCOUNTER — Telehealth: Payer: Self-pay | Admitting: Pulmonary Disease

## 2021-04-11 NOTE — Telephone Encounter (Signed)
Spoke with Randa Ngo physician nurse.  Denton Ar was requesting Spiriva for patient, because patient is using albuterol inhaler more then recommended. Patient LOV was 12/13/19.  Advised for patient to have follow up with pulmonary to discuss inhalers and increased use for shortness of breath.  Denton Ar stated she would encourage patient to schedule follow up with pulmonary. Nothing further at this time.

## 2021-04-14 DIAGNOSIS — R739 Hyperglycemia, unspecified: Secondary | ICD-10-CM | POA: Diagnosis not present

## 2021-04-14 DIAGNOSIS — I10 Essential (primary) hypertension: Secondary | ICD-10-CM | POA: Diagnosis not present

## 2021-04-14 DIAGNOSIS — R7881 Bacteremia: Secondary | ICD-10-CM | POA: Diagnosis not present

## 2021-04-14 DIAGNOSIS — I4519 Other right bundle-branch block: Secondary | ICD-10-CM | POA: Diagnosis not present

## 2021-04-14 DIAGNOSIS — I700 Atherosclerosis of aorta: Secondary | ICD-10-CM | POA: Diagnosis not present

## 2021-04-14 DIAGNOSIS — I472 Ventricular tachycardia, unspecified: Secondary | ICD-10-CM | POA: Diagnosis not present

## 2021-04-14 DIAGNOSIS — N390 Urinary tract infection, site not specified: Secondary | ICD-10-CM | POA: Diagnosis not present

## 2021-04-14 DIAGNOSIS — I493 Ventricular premature depolarization: Secondary | ICD-10-CM | POA: Diagnosis not present

## 2021-04-14 DIAGNOSIS — J449 Chronic obstructive pulmonary disease, unspecified: Secondary | ICD-10-CM | POA: Diagnosis not present

## 2021-04-16 DIAGNOSIS — N390 Urinary tract infection, site not specified: Secondary | ICD-10-CM | POA: Diagnosis not present

## 2021-04-18 DIAGNOSIS — R350 Frequency of micturition: Secondary | ICD-10-CM | POA: Diagnosis not present

## 2021-04-18 DIAGNOSIS — E878 Other disorders of electrolyte and fluid balance, not elsewhere classified: Secondary | ICD-10-CM | POA: Diagnosis not present

## 2021-04-18 DIAGNOSIS — R54 Age-related physical debility: Secondary | ICD-10-CM | POA: Diagnosis not present

## 2021-04-18 DIAGNOSIS — Z9289 Personal history of other medical treatment: Secondary | ICD-10-CM | POA: Diagnosis not present

## 2021-04-18 DIAGNOSIS — N8110 Cystocele, unspecified: Secondary | ICD-10-CM | POA: Diagnosis not present

## 2021-04-18 DIAGNOSIS — N390 Urinary tract infection, site not specified: Secondary | ICD-10-CM | POA: Diagnosis not present

## 2021-04-21 ENCOUNTER — Encounter: Payer: Self-pay | Admitting: Pulmonary Disease

## 2021-04-21 ENCOUNTER — Other Ambulatory Visit: Payer: Self-pay

## 2021-04-21 ENCOUNTER — Ambulatory Visit: Payer: Medicare HMO | Admitting: Pulmonary Disease

## 2021-04-21 VITALS — BP 140/78 | HR 59 | Temp 98.1°F | Ht 67.0 in | Wt 194.4 lb

## 2021-04-21 DIAGNOSIS — J449 Chronic obstructive pulmonary disease, unspecified: Secondary | ICD-10-CM | POA: Diagnosis not present

## 2021-04-21 DIAGNOSIS — R0602 Shortness of breath: Secondary | ICD-10-CM | POA: Diagnosis not present

## 2021-04-21 MED ORDER — TRELEGY ELLIPTA 200-62.5-25 MCG/ACT IN AEPB: 1.0000 | INHALATION_SPRAY | Freq: Every day | RESPIRATORY_TRACT | 2 refills | Status: DC

## 2021-04-21 NOTE — Progress Notes (Signed)
Kelsey Diaz    [id number]    1945/03/13  Primary Care Physician:White, Caren Griffins, MD  Referring Physician: Harlan Stains, MD [place],  [place] [address]  Chief complaint: Follow-up for COPD, asthma, sleep apnea  HPI: 76 year old with history of hypertension, COPD, fibromyalgia, GERD, hypothyroidism, sleep apnea,.  She has previously seen Dr. Halford Chessman at the pulmonary clinic in 2014.  Referred back for management of COPD, asthma She reports worsening dyspnea on exertion or the past 6 months associated with chest tightness, wheezing.  She is just on albuterol inhaler and has been intolerant ofFollow-up Advair, Pulmicort and Spiriva in the past.  She denies any nighttime awakening.  No sputum production, fevers, chills, hemoptysis.   History noted for significant GERD, hiatal hernia, esophageal strictures.  She follows with Dr. Teena Irani, GI heartburn. Reports worsening heartburn, indigestion over the past few months.  She has history of sleep apnea and was previously on CPAP.  She stopped using it many years ago as she was intolerant of the mask and does not want to try again.  She has been taken off methotrexate by Dr. Dossie Der, rheumatology and started Enbrel for rheumatoid arthritis  in 2021  Pets: No pets Occupation: Retired Secondary school teacher at [employer] Exposures: No mold, hot tubs.  No other significant exposure. Smoking history: 80-pack-year smoking history.  Quit in 2001 Travel History: Not significant.  Lived in [place] all her life.  Interim history: Has not followed up in 2022 as she was grieving from the loss of her husband and was dealing with depression She had 2 hospitalizations.  The first hospitalization was for VTE.  She was then admitted on for UTI, E. coli sepsis which was stopped due to hallucination.  She then had a hospitalization for E. coli UTI and sepsis  States that breathing is worse with chest tightness.  Recently  given stiolto sample by her primary care which seems to help somewhat  Outpatient Encounter Medications as of 04/21/2021  Medication Sig   ACCU-CHEK GUIDE test strip    Accu-Chek Softclix Lancets lancets    albuterol (VENTOLIN HFA) 108 (90 Base) MCG/ACT inhaler Inhale 2 puffs into the lungs every 6 (six) hours as needed for shortness of breath.    aspirin 81 MG chewable tablet Chew 81 mg by mouth daily.   Blood Glucose Monitoring Suppl (ACCU-CHEK GUIDE ME) w/Device KIT    Calcium Carbonate-Vitamin D 600-200 MG-UNIT TABS Take 1 tablet by mouth daily.    Clobetasol Prop Emollient Base 0.05 % emollient cream Apply 1 application topically 2 (two) times daily.   DULoxetine (CYMBALTA) 60 MG capsule Take 60 mg by mouth 2 (two) times daily.   etanercept (ENBREL) 50 MG/ML injection Inject 50 mg into the skin once a week. Friday   fluocinonide (LIDEX) 0.05 % external solution Apply 1 application topically 2 (two) times daily as needed (skin irritation).    fluticasone (FLONASE) 50 MCG/ACT nasal spray 2 sprays daily as needed for allergies.   folic acid (FOLVITE) 1 MG tablet Take 1 tablet by mouth daily.   HYDROcodone-acetaminophen (NORCO) 7.5-325 MG tablet Take 1 tablet by mouth every 4 (four) hours as needed for moderate pain or severe pain.   meloxicam (MOBIC) 15 MG tablet Take 15 mg by mouth daily.   metoprolol succinate (TOPROL-XL) 50 MG 24 hr tablet Take 1 tablet (50 mg total) by mouth 2 (two) times daily. Take with or immediately following  a meal. (Patient taking differently: Take 25 mg by mouth 2 (two) times daily. Taking 1/2 tab ( $Rem'25mg'gONo$ ) twice daily-Take with or immediately following a meal.)   Multiple Vitamin (MULTI-VITAMIN PO) Take 1 tablet by mouth daily.    nystatin (MYCOSTATIN/NYSTOP) powder 1 application daily as needed (skin irritation).   Omega-3 Fatty Acids (FISH OIL PO) Take 1,000 mg by mouth daily.    omeprazole (PRILOSEC) 40 MG capsule Take 40 mg by mouth daily.   OSPHENA 60 MG TABS  Take 1 tablet by mouth daily.   rosuvastatin (CRESTOR) 20 MG tablet Take 20 mg by mouth once a week.   Tiotropium Bromide-Olodaterol (STIOLTO RESPIMAT) 2.5-2.5 MCG/ACT AERS Inhale 2 puffs into the lungs daily.   No facility-administered encounter medications on file as of 04/21/2021.   Physical Exam: Blood pressure 140/78, pulse (!) 59, temperature 98.1 F (36.7 C), temperature source Oral, height $RemoveBefo'5\' 7"'[id number]$  (1.702 m), weight 194 lb 6.4 oz (88.2 kg), SpO2 94 %. Gen:      No acute distress HEENT:  EOMI, sclera anicteric Neck:     No masses; no thyromegaly Lungs:    Clear to auscultation bilaterally; normal respiratory effort CV:         Regular rate and rhythm; no murmurs Abd:      + bowel sounds; soft, non-tender; no palpable masses, no distension Ext:    No edema; adequate peripheral perfusion Skin:      Warm and dry; no rash Neuro: alert and oriented x 3 Psych: normal mood and affect   Data Reviewed: PFTs  10/09/11 FVC 3.51 [104%], FEV1 2.20 [89%], F/F 63, TLC 92%, DLCO 52% Mild obstructive airway disease.  Moderate decrease in diffusion capacity.  No bronchodilator response.  05/04/17 FVC 3.20 [101%], FEV1 2.23 [97%], F/F 73, TLC 108%, DLCO 53% Minimal obstructive airway disease, moderate decrease in diffusion capacity.  No bronchodilator response.  Labs CBC 03/25/17-WBC 6.9, eos 3.8%, absolute eosinophil count 300 Allergy profile 03/25/17-IgE 52, RAST panel negative Alpha-1 antitrypsin 03/25/17-193, PIMM  FENO 03/25/17-37 FENO 05/04/17-34  Imaging Chest x-ray 09/13/2017-emphysema, chronic bronchitis.  With no active pulmonary disease. CT abdomen pelvis 12/01/14- visualized lung bases are clear. CT high-resolution 08/31/2019-no interstitial lung disease, mild to moderate emphysema, aortic, coronary atherosclerosis. Chest x-ray 04/04/2021-no acute cardiopulmonary disease I have reviewed the images personally.  PFTs: 08/11/2019 FVC 3.02 [97%), FEV1 2.16 [92%], F/F 71, TLC 7.62 [142%],  DLCO 15.86 [77%] Minimal obstructive airway disease  Assessment:  COPD, moderate persistent asthma  PFTs reviewed. Although she does not have obstruction by F/F criteria at this curvature to the loop suggestive of minimal obstructive airway disease. Reduction in diffusion capacity noted.  There is no evidence of interstitial lung disease.  Has not tolerated multiple inhalers in the past including Advair, Breo, Spiriva Now on Stiolto.  I will switch her to Trelegy as she needs to be on ICS given asthma  Rheumatoid arthritis, on Enbrel No evidence of interstitial lung disease on prior high-res CT and PFTs. Reorder high-res CT and PFTs for reassessment as she has worsening dyspnea  GERD Follows with [facility].  Continues on PPI.  Obstructive sleep apnea Noncompliant with CPAP.  Discussed with patient and she is not interested in retrying CPAP therapy.  Health maintenance 03/16/2013-Prevnar 03/01/2017-Pneumovax  Plan/Recommendations: - Stop Stiolto and start Trelegy - High-res CT, PFTs  Marshell Garfinkel MD [facility] 04/21/2021, 3:14 PM  CC: Harlan Stains, MD

## 2021-04-21 NOTE — Patient Instructions (Signed)
We will stop the stiolto and start you on Trelegy inhaler Schedule high-res CT and PFTs for reassessment of the lung  Follow-up in 3 months

## 2021-04-24 ENCOUNTER — Other Ambulatory Visit: Payer: Self-pay

## 2021-04-24 ENCOUNTER — Ambulatory Visit (HOSPITAL_COMMUNITY)
Admission: RE | Admit: 2021-04-24 | Discharge: 2021-04-24 | Disposition: A | Discharge status: Home / Self Care | Payer: Medicare HMO | Source: Ambulatory Visit | Attending: Pulmonary Disease | Admitting: Pulmonary Disease

## 2021-04-24 DIAGNOSIS — I700 Atherosclerosis of aorta: Secondary | ICD-10-CM | POA: Diagnosis not present

## 2021-04-24 DIAGNOSIS — R0602 Shortness of breath: Secondary | ICD-10-CM | POA: Insufficient documentation

## 2021-04-24 DIAGNOSIS — J432 Centrilobular emphysema: Secondary | ICD-10-CM | POA: Diagnosis not present

## 2021-04-24 DIAGNOSIS — I2510 Atherosclerotic heart disease of native coronary artery without angina pectoris: Secondary | ICD-10-CM | POA: Diagnosis not present

## 2021-04-24 DIAGNOSIS — K449 Diaphragmatic hernia without obstruction or gangrene: Secondary | ICD-10-CM | POA: Diagnosis not present

## 2021-04-28 ENCOUNTER — Telehealth: Payer: Self-pay | Admitting: Cardiology

## 2021-04-28 MED ORDER — METOPROLOL SUCCINATE ER 50 MG PO TB24: 50.0000 mg | ORAL_TABLET | Freq: Two times a day (BID) | ORAL | 3 refills | Status: DC

## 2021-04-28 NOTE — Telephone Encounter (Signed)
Pt's medication was sent to pt's pharmacy as requested. Confirmation received.  °

## 2021-04-28 NOTE — Telephone Encounter (Signed)
?*  STAT* If patient is at the pharmacy, call can be transferred to refill team. ? ? ?1. Which medications need to be refilled? (please list name of each medication and dose if known) metoprolol succinate (TOPROL-XL) 50 MG 24 hr tablet ? ?2. Which pharmacy/location (including street and city if local pharmacy) is medication to be sent to?[facility] [phone] ? ?3. Do they need a 30 day or 90 day supply? 90 day  ?

## 2021-04-29 ENCOUNTER — Other Ambulatory Visit: Payer: Self-pay | Admitting: Pulmonary Disease

## 2021-04-29 MED ORDER — AMOXICILLIN-POT CLAVULANATE 875-125 MG PO TABS: 1.0000 | ORAL_TABLET | Freq: Two times a day (BID) | ORAL | 0 refills | Status: AC

## 2021-04-30 DIAGNOSIS — N390 Urinary tract infection, site not specified: Secondary | ICD-10-CM | POA: Diagnosis not present

## 2021-05-13 DIAGNOSIS — E1169 Type 2 diabetes mellitus with other specified complication: Secondary | ICD-10-CM | POA: Diagnosis not present

## 2021-05-13 DIAGNOSIS — I10 Essential (primary) hypertension: Secondary | ICD-10-CM | POA: Diagnosis not present

## 2021-05-22 ENCOUNTER — Ambulatory Visit: Payer: Medicare HMO | Attending: Anesthesiology | Admitting: Anesthesiology

## 2021-05-22 ENCOUNTER — Encounter: Payer: Self-pay | Admitting: Anesthesiology

## 2021-05-22 DIAGNOSIS — M4716 Other spondylosis with myelopathy, lumbar region: Secondary | ICD-10-CM | POA: Diagnosis not present

## 2021-05-22 DIAGNOSIS — N390 Urinary tract infection, site not specified: Secondary | ICD-10-CM | POA: Diagnosis not present

## 2021-05-22 DIAGNOSIS — G894 Chronic pain syndrome: Secondary | ICD-10-CM

## 2021-05-22 DIAGNOSIS — M5136 Other intervertebral disc degeneration, lumbar region: Secondary | ICD-10-CM

## 2021-05-22 DIAGNOSIS — M47817 Spondylosis without myelopathy or radiculopathy, lumbosacral region: Secondary | ICD-10-CM

## 2021-05-22 DIAGNOSIS — M5431 Sciatica, right side: Secondary | ICD-10-CM | POA: Diagnosis not present

## 2021-05-22 DIAGNOSIS — N8110 Cystocele, unspecified: Secondary | ICD-10-CM | POA: Diagnosis not present

## 2021-05-22 DIAGNOSIS — M25551 Pain in right hip: Secondary | ICD-10-CM

## 2021-05-22 DIAGNOSIS — F1190 Opioid use, unspecified, uncomplicated: Secondary | ICD-10-CM | POA: Diagnosis not present

## 2021-05-22 DIAGNOSIS — R3915 Urgency of urination: Secondary | ICD-10-CM | POA: Diagnosis not present

## 2021-05-22 DIAGNOSIS — M0579 Rheumatoid arthritis with rheumatoid factor of multiple sites without organ or systems involvement: Secondary | ICD-10-CM

## 2021-05-22 DIAGNOSIS — M25552 Pain in left hip: Secondary | ICD-10-CM

## 2021-05-22 DIAGNOSIS — M5432 Sciatica, left side: Secondary | ICD-10-CM

## 2021-05-22 DIAGNOSIS — R413 Other amnesia: Secondary | ICD-10-CM | POA: Diagnosis not present

## 2021-05-22 DIAGNOSIS — M797 Fibromyalgia: Secondary | ICD-10-CM | POA: Diagnosis not present

## 2021-05-22 MED ORDER — HYDROCODONE-ACETAMINOPHEN 7.5-325 MG PO TABS
1.0000 | ORAL_TABLET | Freq: Four times a day (QID) | ORAL | 0 refills | Status: DC | PRN
Start: 2021-06-23 — End: 2021-06-24

## 2021-05-22 MED ORDER — HYDROCODONE-ACETAMINOPHEN 7.5-325 MG PO TABS: 1.0000 | ORAL_TABLET | ORAL | 0 refills | Status: AC | PRN

## 2021-05-22 NOTE — Progress Notes (Signed)
Virtual Visit via Telephone Note ? ?I connected with Brand Males on 05/22/21 at  1:15 PM EDT by telephone and verified that I am speaking with the correct person using two identifiers. ? ?Location: ?Patient: Home ?Provider: Pain control center ?  ?I discussed the limitations, risks, security and privacy concerns of performing an evaluation and management service by telephone and the availability of in person appointments. I also discussed with the patient that there may be a patient responsible charge related to this service. The patient expressed understanding and agreed to proceed. ? ? ?History of Present Illness: ?I spoke with Kelsey Diaz via telephone as we were unable link for the video portion of the conference.  She has been holding she has been holding up well she reports in regards to her low back.  She last had an epidural back in December and is still doing reasonably well with her sciatica and low back pain.  This is stayed under reasonably good control.  She did have some breakthrough recently and we increased her hydrocodone dose to 5 times a day dosing for every 4 hours and this is given her better and more consistent pain relief she reports.  She did have a recent urinary tract infection and problems with urosepsis requiring a hospital admission and this is currently still being worked up and under evaluation.  The quality characteristic and distribution of her low back pain has been stable.  She generally responds favorably to epidurals for sciatica and unfortunately the low back pain and leg pain have been troubling to the point they have required chronic opioid therapy and she has failed more conservative therapy.  She generally gets 75% to 80% improvement with her pain for about 4 hours after dosing and she is without side effects with the medication. ?Review of systems: ?General: No fevers or chills ?Pulmonary: No shortness of breath or dyspnea ?Cardiac: No angina or palpitations or  lightheadedness ?GI: No abdominal pain or constipation ?Psych: No depression  ? ?  ?Observations/Objective: ? ? ?Current Outpatient Medications:  ?  [START ON 05/24/2021] HYDROcodone-acetaminophen (NORCO) 7.5-325 MG tablet, Take 1 tablet by mouth every 4 (four) hours as needed for moderate pain or severe pain., Disp: 150 tablet, Rfl: 0 ?  [START ON 06/23/2021] HYDROcodone-acetaminophen (NORCO) 7.5-325 MG tablet, Take 1 tablet by mouth every 6 (six) hours as needed for moderate pain or severe pain., Disp: 120 tablet, Rfl: 0 ?  ACCU-CHEK GUIDE test strip, , Disp: , Rfl:  ?  Accu-Chek Softclix Lancets lancets, , Disp: , Rfl:  ?  albuterol (VENTOLIN HFA) 108 (90 Base) MCG/ACT inhaler, Inhale 2 puffs into the lungs every 6 (six) hours as needed for shortness of breath. , Disp: , Rfl:  ?  aspirin 81 MG chewable tablet, Chew 81 mg by mouth daily., Disp: , Rfl:  ?  Blood Glucose Monitoring Suppl (ACCU-CHEK GUIDE ME) w/Device KIT, , Disp: , Rfl:  ?  Calcium Carbonate-Vitamin D 600-200 MG-UNIT TABS, Take 1 tablet by mouth daily. , Disp: , Rfl:  ?  Clobetasol Prop Emollient Base 0.05 % emollient cream, Apply 1 application topically 2 (two) times daily., Disp: , Rfl:  ?  DULoxetine (CYMBALTA) 60 MG capsule, Take 60 mg by mouth 2 (two) times daily., Disp: , Rfl:  ?  etanercept (ENBREL) 50 MG/ML injection, Inject 50 mg into the skin once a week. Friday, Disp: , Rfl:  ?  fluocinonide (LIDEX) 0.05 % external solution, Apply 1 application topically 2 (two) times  daily as needed (skin irritation). , Disp: , Rfl:  ?  fluticasone (FLONASE) 50 MCG/ACT nasal spray, 2 sprays daily as needed for allergies., Disp: , Rfl:  ?  Fluticasone-Umeclidin-Vilant (TRELEGY ELLIPTA) 200-62.5-25 MCG/ACT AEPB, Inhale 1 puff into the lungs daily., Disp: 60 each, Rfl: 2 ?  folic acid (FOLVITE) 1 MG tablet, Take 1 tablet by mouth daily., Disp: , Rfl:  ?  meloxicam (MOBIC) 15 MG tablet, Take 15 mg by mouth daily., Disp: , Rfl:  ?  metoprolol succinate  (TOPROL-XL) 50 MG 24 hr tablet, Take 1 tablet (50 mg total) by mouth 2 (two) times daily. Take with or immediately following a meal., Disp: 180 tablet, Rfl: 3 ?  Multiple Vitamin (MULTI-VITAMIN PO), Take 1 tablet by mouth daily. , Disp: , Rfl:  ?  nystatin (MYCOSTATIN/NYSTOP) powder, 1 application daily as needed (skin irritation)., Disp: , Rfl:  ?  Omega-3 Fatty Acids (FISH OIL PO), Take 1,000 mg by mouth daily. , Disp: , Rfl:  ?  omeprazole (PRILOSEC) 40 MG capsule, Take 40 mg by mouth daily., Disp: , Rfl: 1 ?  OSPHENA 60 MG TABS, Take 1 tablet by mouth daily., Disp: , Rfl:  ?  rosuvastatin (CRESTOR) 20 MG tablet, Take 20 mg by mouth once a week., Disp: , Rfl:   ? ?Past Medical History:  ?Diagnosis Date  ? Allergic rhinitis   ? Anxiety   ? Asthma   ? Bilateral hip pain 11/28/2018  ? Colitis   ? COPD (chronic obstructive pulmonary disease) ([place])   ? Fibromyalgia 05/16/2019  ? GERD (gastroesophageal reflux disease)   ? With esophageal strictures  ? Hypertension   ? Hyperthyroidism   ? OSA (obstructive sleep apnea) 02/09/2012  ? No mask.  Did not tolerate  ? Osteoarthritis   ? Rheumatoid arthritis ([place]) 10/03/2018  ?  ?Assessment and Plan: ?1. DDD (degenerative disc disease), lumbar   ?2. Bilateral sciatica   ?3. Bilateral hip pain   ?4. Lumbar spondylosis with myelopathy   ?5. Chronic, continuous use of opioids   ?6. Fibromyalgia   ?7. Chronic pain syndrome   ?8. Rheumatoid arthritis involving multiple sites with positive rheumatoid factor ([place])   ?9. Facet arthritis of lumbosacral region   ?Based on our discussion today I think is appropriate to refill her medicines.  This be dated for April 1 and May 1.  We will allow for the 5 times a day dosing for April and move her back to 4 times a day dosing for May.  I want her to continue stretching strengthening exercises and hopefully she will be able to resolve the UTI issue which is currently scheduled for an appointment sometime in the next week.  At that time we have  also requested a urine screen for her for routine clinic monitoring.  I have reviewed the [facility] practitioner database information and it is appropriate.  I want her to continue follow-up with her primary care physicians for baseline medical care with return to clinic in 2 months. ? ?Follow Up Instructions: ? ?  ?I discussed the assessment and treatment plan with the patient. The patient was provided an opportunity to ask questions and all were answered. The patient agreed with the plan and demonstrated an understanding of the instructions. ?  ?The patient was advised to call back or seek an in-person evaluation if the symptoms worsen or if the condition fails to improve as anticipated. ? ?I provided 30 minutes of non-face-to-face time during this encounter. ? ? ?  Molli Barrows, MD  ?

## 2021-05-26 ENCOUNTER — Telehealth: Payer: Self-pay

## 2021-05-26 ENCOUNTER — Telehealth: Payer: Self-pay | Admitting: Pulmonary Disease

## 2021-05-26 NOTE — Telephone Encounter (Signed)
Spoke with [facility] who states pt is not currently using any scheduled inhalers because pt's daughter is reporting that the Trelegy is hurting pt's throat. Pt has tried [employer] in past and pt reports that has worked. ATC daughter and left message to gain more details about situation.  ?

## 2021-05-29 NOTE — Telephone Encounter (Signed)
I called and spoke with the pt  ?She states that she did not prefer the trelegy bc it caused a burning sensation in her throat  ?She was rinsing well after inhaler use ?She is back on the [facility] and feels that this works well for her breathing  ?I advised will let Dr Vaughan Browner know and will call her with any objections to this  ?Nothing further needed at this time ?

## 2021-06-03 NOTE — Telephone Encounter (Signed)
Seems like encounter was created in error. ?

## 2021-06-12 ENCOUNTER — Telehealth: Payer: Self-pay | Admitting: Pulmonary Disease

## 2021-06-12 NOTE — Telephone Encounter (Signed)
Called pt and spoke with daughter Lynelle Smoke about message received from [place]. Tammy said that pt is no longer using the Trelegy due to it making her mouth go very raw and switched back to the [employer]. ? ?She said that pt does not need any samples of Stiolto as she still has one box that has not even been opened yet. Stated to her if she needed anything prior to OV with Dr. Vaughan Browner to give the office a call and she verbalized understanding. Nothing further needed. ?

## 2021-06-13 DIAGNOSIS — E1169 Type 2 diabetes mellitus with other specified complication: Secondary | ICD-10-CM | POA: Diagnosis not present

## 2021-06-13 DIAGNOSIS — J441 Chronic obstructive pulmonary disease with (acute) exacerbation: Secondary | ICD-10-CM | POA: Diagnosis not present

## 2021-06-13 DIAGNOSIS — I10 Essential (primary) hypertension: Secondary | ICD-10-CM | POA: Diagnosis not present

## 2021-06-23 ENCOUNTER — Telehealth: Payer: Self-pay | Admitting: Anesthesiology

## 2021-06-23 NOTE — Telephone Encounter (Signed)
Advised patient's daughter to find another pharmacy which has Hydrocodone in stock and let us know, so we will send a new Rx ?

## 2021-06-23 NOTE — Telephone Encounter (Signed)
Kelsey Diaz calling for her Mom. [facility] not [facility] to [facility] script which is due 06-26-21. Wants to know if you can write for something different ? Please call asap ?

## 2021-06-24 ENCOUNTER — Telehealth: Payer: Self-pay | Admitting: Anesthesiology

## 2021-06-24 ENCOUNTER — Other Ambulatory Visit: Payer: Self-pay | Admitting: *Deleted

## 2021-06-24 MED ORDER — HYDROCODONE-ACETAMINOPHEN 7.5-325 MG PO TABS: 1.0000 | ORAL_TABLET | Freq: Four times a day (QID) | ORAL | 0 refills | Status: DC | PRN

## 2021-06-24 NOTE — Telephone Encounter (Signed)
Sent to Dr Andree Elk for refill medication.  ?

## 2021-06-24 NOTE — Telephone Encounter (Signed)
Dr  Andree Elk is out of town.  He has communicated and does approve.  I have resent to Dr Holley Raring to see if would take care of it.   ?

## 2021-06-24 NOTE — Telephone Encounter (Signed)
Daughter lvmail stating the [facility] pharmacy at [address] has Hydrocodone. Can her mothers script be sent there please. And cal them to let them know ?

## 2021-06-24 NOTE — Telephone Encounter (Signed)
Patient aware that hydro - apap 7.5 - 325 mg has been sent to [facility]t [facility] not [facility].  ?

## 2021-07-07 DIAGNOSIS — E1169 Type 2 diabetes mellitus with other specified complication: Secondary | ICD-10-CM | POA: Diagnosis not present

## 2021-07-07 DIAGNOSIS — J441 Chronic obstructive pulmonary disease with (acute) exacerbation: Secondary | ICD-10-CM | POA: Diagnosis not present

## 2021-07-07 DIAGNOSIS — I10 Essential (primary) hypertension: Secondary | ICD-10-CM | POA: Diagnosis not present

## 2021-07-09 ENCOUNTER — Telehealth: Payer: Medicare HMO | Admitting: Anesthesiology

## 2021-07-09 DIAGNOSIS — F1190 Opioid use, unspecified, uncomplicated: Secondary | ICD-10-CM | POA: Diagnosis not present

## 2021-07-09 DIAGNOSIS — G894 Chronic pain syndrome: Secondary | ICD-10-CM | POA: Diagnosis not present

## 2021-07-10 ENCOUNTER — Encounter: Payer: Self-pay | Admitting: Anesthesiology

## 2021-07-10 MED ORDER — HYDROCODONE-ACETAMINOPHEN 7.5-325 MG PO TABS: 1.0000 | ORAL_TABLET | Freq: Four times a day (QID) | ORAL | 0 refills | Status: AC | PRN

## 2021-07-10 MED ORDER — HYDROCODONE-ACETAMINOPHEN 7.5-325 MG PO TABS: 1.0000 | ORAL_TABLET | Freq: Four times a day (QID) | ORAL | 0 refills | Status: DC | PRN

## 2021-07-10 NOTE — Progress Notes (Signed)
Virtual Visit via Telephone Note  I connected with Kelsey Diaz on 07/10/21 at 10:30 AM EDT by telephone and verified that I am speaking with the correct person using two identifiers.  Location: Patient: Home Provider: Pain control center   I discussed the limitations, risks, security and privacy concerns of performing an evaluation and management service by telephone and the availability of in person appointments. I also discussed with the patient that there may be a patient responsible charge related to this service. The patient expressed understanding and agreed to proceed.   History of Present Illness: I spoke with Kelsey Diaz today via telephone as we are unable like for the video portion of the conference and she reports that she is doing well in regards to her low back pain.  She still has quite a bit of low back pain and "hip pain but this is generally reasonably well controlled with the 7.5 mg hydrocodone she takes every 4-6 hours.  She averages 4 tablets/day and derives good functional lifestyle improvement with these.  In the past she has had epidural steroid injections to help with her sciatica symptoms but right now feels that she is doing reasonably well.  Her last epidural was back in December.  The quality characteristic and distribution of her low back pain and leg pain is stable in nature with no recent changes.  Otherwise she is in her usual state of health.  Unfortunately she still is feeling quite lethargic and is struggling to get her energy back following her recent bout with urosepsis.  She is clear on her urine screens but still is slow to get return of energy.  Review of systems: General: No fevers or chills Pulmonary: No shortness of breath or dyspnea Cardiac: No angina or palpitations or lightheadedness GI: No abdominal pain or constipation Psych: No depression    Observations/Objective:  Current Outpatient Medications:    [START ON 08/23/2021]  HYDROcodone-acetaminophen (NORCO) 7.5-325 MG tablet, Take 1 tablet by mouth every 6 (six) hours as needed for moderate pain or severe pain., Disp: 120 tablet, Rfl: 0   ACCU-CHEK GUIDE test strip, , Disp: , Rfl:    Accu-Chek Softclix Lancets lancets, , Disp: , Rfl:    albuterol (VENTOLIN HFA) 108 (90 Base) MCG/ACT inhaler, Inhale 2 puffs into the lungs every 6 (six) hours as needed for shortness of breath. , Disp: , Rfl:    aspirin 81 MG chewable tablet, Chew 81 mg by mouth daily., Disp: , Rfl:    Blood Glucose Monitoring Suppl (ACCU-CHEK GUIDE ME) w/Device KIT, , Disp: , Rfl:    Calcium Carbonate-Vitamin D 600-200 MG-UNIT TABS, Take 1 tablet by mouth daily. , Disp: , Rfl:    Clobetasol Prop Emollient Base 0.05 % emollient cream, Apply 1 application topically 2 (two) times daily., Disp: , Rfl:    DULoxetine (CYMBALTA) 60 MG capsule, Take 60 mg by mouth 2 (two) times daily., Disp: , Rfl:    etanercept (ENBREL) 50 MG/ML injection, Inject 50 mg into the skin once a week. Friday, Disp: , Rfl:    fluocinonide (LIDEX) 0.05 % external solution, Apply 1 application topically 2 (two) times daily as needed (skin irritation). , Disp: , Rfl:    fluticasone (FLONASE) 50 MCG/ACT nasal spray, 2 sprays daily as needed for allergies., Disp: , Rfl:    Fluticasone-Umeclidin-Vilant (TRELEGY ELLIPTA) 200-62.5-25 MCG/ACT AEPB, Inhale 1 puff into the lungs daily., Disp: 60 each, Rfl: 2   folic acid (FOLVITE) 1 MG tablet, Take 1 tablet  by mouth daily., Disp: , Rfl:    [START ON 07/24/2021] HYDROcodone-acetaminophen (NORCO) 7.5-325 MG tablet, Take 1 tablet by mouth every 6 (six) hours as needed for moderate pain or severe pain., Disp: 120 tablet, Rfl: 0   meloxicam (MOBIC) 15 MG tablet, Take 15 mg by mouth daily., Disp: , Rfl:    metoprolol succinate (TOPROL-XL) 50 MG 24 hr tablet, Take 1 tablet (50 mg total) by mouth 2 (two) times daily. Take with or immediately following a meal., Disp: 180 tablet, Rfl: 3   Multiple Vitamin  (MULTI-VITAMIN PO), Take 1 tablet by mouth daily. , Disp: , Rfl:    nystatin (MYCOSTATIN/NYSTOP) powder, 1 application daily as needed (skin irritation)., Disp: , Rfl:    Omega-3 Fatty Acids (FISH OIL PO), Take 1,000 mg by mouth daily. , Disp: , Rfl:    omeprazole (PRILOSEC) 40 MG capsule, Take 40 mg by mouth daily., Disp: , Rfl: 1   OSPHENA 60 MG TABS, Take 1 tablet by mouth daily., Disp: , Rfl:    rosuvastatin (CRESTOR) 20 MG tablet, Take 20 mg by mouth once a week., Disp: , Rfl:    Past Medical History:  Diagnosis Date   Allergic rhinitis    Anxiety    Asthma    Bilateral hip pain 11/28/2018   Colitis    COPD (chronic obstructive pulmonary disease) ([place])    Fibromyalgia 05/16/2019   GERD (gastroesophageal reflux disease)    With esophageal strictures   Hypertension    Hyperthyroidism    OSA (obstructive sleep apnea) 02/09/2012   No mask.  Did not tolerate   Osteoarthritis    Rheumatoid arthritis ([place]) 10/03/2018     Assessment and Plan: 1. DDD (degenerative disc disease), lumbar   2. Bilateral sciatica   3. Bilateral hip pain   4. Lumbar spondylosis with myelopathy   5. Chronic, continuous use of opioids   6. Fibromyalgia   7. Chronic pain syndrome   8. Rheumatoid arthritis involving multiple sites with positive rheumatoid factor ([place])   9. Facet arthritis of lumbosacral region   10. Pain in both hands   Based on our discussion today I think it is appropriate to continue with her current medical regimen.  I am going to give her refills for the next 2 months dated for June 1 and July 1.  No changes in her regimen will be initiated.  I have reviewed the [facility] practitioner database information and it is appropriate.  I want her to continue efforts at stretching strengthening.  We will defer on any repeat epidural injections at this point.  Continue follow-up with her primary care physicians especially her urologist for ongoing treatment.  Continue efforts at ambulation and  she is scheduled for a 17-monthreturn to clinic.  She did have a urine tox screen yesterday that is not back at this point.  Follow Up Instructions:    I discussed the assessment and treatment plan with the patient. The patient was provided an opportunity to ask questions and all were answered. The patient agreed with the plan and demonstrated an understanding of the instructions.   The patient was advised to call back or seek an in-person evaluation if the symptoms worsen or if the condition fails to improve as anticipated.  I provided 30 minutes of non-face-to-face time during this encounter.   JMolli Barrows MD

## 2021-07-11 ENCOUNTER — Ambulatory Visit: Payer: Medicare HMO | Attending: Anesthesiology | Admitting: Anesthesiology

## 2021-07-11 ENCOUNTER — Encounter: Payer: Self-pay | Admitting: Pulmonary Disease

## 2021-07-11 ENCOUNTER — Ambulatory Visit (INDEPENDENT_AMBULATORY_CARE_PROVIDER_SITE_OTHER): Payer: Medicare HMO | Admitting: Pulmonary Disease

## 2021-07-11 ENCOUNTER — Ambulatory Visit: Payer: Medicare HMO | Admitting: Pulmonary Disease

## 2021-07-11 VITALS — BP 116/70 | HR 65 | Temp 98.2°F | Ht 66.0 in | Wt 191.0 lb

## 2021-07-11 DIAGNOSIS — M5432 Sciatica, left side: Secondary | ICD-10-CM

## 2021-07-11 DIAGNOSIS — M79642 Pain in left hand: Secondary | ICD-10-CM

## 2021-07-11 DIAGNOSIS — J449 Chronic obstructive pulmonary disease, unspecified: Secondary | ICD-10-CM | POA: Diagnosis not present

## 2021-07-11 DIAGNOSIS — M797 Fibromyalgia: Secondary | ICD-10-CM

## 2021-07-11 DIAGNOSIS — M5136 Other intervertebral disc degeneration, lumbar region: Secondary | ICD-10-CM | POA: Diagnosis not present

## 2021-07-11 DIAGNOSIS — M47817 Spondylosis without myelopathy or radiculopathy, lumbosacral region: Secondary | ICD-10-CM

## 2021-07-11 DIAGNOSIS — R0602 Shortness of breath: Secondary | ICD-10-CM

## 2021-07-11 DIAGNOSIS — G894 Chronic pain syndrome: Secondary | ICD-10-CM | POA: Diagnosis not present

## 2021-07-11 DIAGNOSIS — M0579 Rheumatoid arthritis with rheumatoid factor of multiple sites without organ or systems involvement: Secondary | ICD-10-CM

## 2021-07-11 DIAGNOSIS — M4716 Other spondylosis with myelopathy, lumbar region: Secondary | ICD-10-CM

## 2021-07-11 DIAGNOSIS — M79641 Pain in right hand: Secondary | ICD-10-CM

## 2021-07-11 DIAGNOSIS — M25551 Pain in right hip: Secondary | ICD-10-CM | POA: Diagnosis not present

## 2021-07-11 DIAGNOSIS — M5431 Sciatica, right side: Secondary | ICD-10-CM | POA: Diagnosis not present

## 2021-07-11 DIAGNOSIS — F1190 Opioid use, unspecified, uncomplicated: Secondary | ICD-10-CM

## 2021-07-11 DIAGNOSIS — M25552 Pain in left hip: Secondary | ICD-10-CM

## 2021-07-11 LAB — PULMONARY FUNCTION TEST
DL/VA % pred: 65 %
DL/VA: 2.66 ml/min/mmHg/L
DLCO cor % pred: 58 %
DLCO cor: 11.95 ml/min/mmHg
DLCO unc % pred: 58 %
DLCO unc: 11.95 ml/min/mmHg
FEF 25-75 Post: 2.05 L/sec
FEF 25-75 Pre: 1.16 L/sec
FEF2575-%Change-Post: 77 %
FEF2575-%Pred-Post: 118 %
FEF2575-%Pred-Pre: 67 %
FEV1-%Change-Post: 13 %
FEV1-%Pred-Post: 93 %
FEV1-%Pred-Pre: 82 %
FEV1-Post: 2.13 L
FEV1-Pre: 1.88 L
FEV1FVC-%Change-Post: 3 %
FEV1FVC-%Pred-Pre: 93 %
FEV6-%Change-Post: 9 %
FEV6-%Pred-Post: 101 %
FEV6-%Pred-Pre: 92 %
FEV6-Post: 2.93 L
FEV6-Pre: 2.67 L
FEV6FVC-%Change-Post: 0 %
FEV6FVC-%Pred-Post: 104 %
FEV6FVC-%Pred-Pre: 104 %
FVC-%Change-Post: 9 %
FVC-%Pred-Post: 97 %
FVC-%Pred-Pre: 88 %
FVC-Post: 2.94 L
FVC-Pre: 2.68 L
Post FEV1/FVC ratio: 73 %
Post FEV6/FVC ratio: 100 %
Pre FEV1/FVC ratio: 70 %
Pre FEV6/FVC Ratio: 100 %
RV % pred: 116 %
RV: 2.8 L
TLC % pred: 102 %
TLC: 5.51 L

## 2021-07-11 MED ORDER — STIOLTO RESPIMAT 2.5-2.5 MCG/ACT IN AERS: 2.0000 | INHALATION_SPRAY | Freq: Every day | RESPIRATORY_TRACT | 11 refills | Status: DC

## 2021-07-11 NOTE — Patient Instructions (Signed)
Full PFT performed today. °

## 2021-07-11 NOTE — Progress Notes (Signed)
Kelsey Diaz    [id number]    November 26, 1945  Primary Care Physician:White, Caren Griffins, MD  Referring Physician: Harlan Stains, MD [place],  [place] [id number]  Chief complaint: Follow-up for COPD, asthma, sleep apnea  HPI: 76 year old with history of hypertension, COPD, fibromyalgia, GERD, hypothyroidism, sleep apnea,.  She has previously seen Dr. Halford Chessman at the pulmonary clinic in 2014.  Referred back for management of COPD, asthma She reports worsening dyspnea on exertion or the past 6 months associated with chest tightness, wheezing.  She is just on albuterol inhaler and has been intolerant ofFollow-up Advair, Pulmicort and Spiriva in the past.  She denies any nighttime awakening.  No sputum production, fevers, chills, hemoptysis.   History noted for significant GERD, hiatal hernia, esophageal strictures.  She follows with Dr. Teena Irani, GI heartburn. Reports worsening heartburn, indigestion over the past few months.  She has history of sleep apnea and was previously on CPAP.  She stopped using it many years ago as she was intolerant of the mask and does not want to try again.  She has been taken off methotrexate by Dr. Dossie Der, rheumatology and started Enbrel for rheumatoid arthritis  in 2021  Has not followed up in 2022 as she was grieving from the loss of her husband and was dealing with depression She had 2 hospitalizations in late 2022 and early 2023.  The first hospitalization was for ventricular tachycardia.  She was then admitted on for UTI, E. coli sepsis which was stopped due to hallucination.  She then had a hospitalization for E. coli UTI and sepsis  Pets: No pets Occupation: Retired Secondary school teacher at [employer] Exposures: No mold, hot tubs.  No other significant exposure. Smoking history: 80-pack-year smoking history.  Quit in 2001 Travel History: Not significant.  Lived in [place] all her life.  Interim history: States that  breathing continues to be poor with fatigue Stiolto stopped and she was started on Trelegy but she did not like the inhaler and is back on stiolto now  Here for review of CT and PFTs  Outpatient Encounter Medications as of 07/11/2021  Medication Sig   ACCU-CHEK GUIDE test strip    Accu-Chek Softclix Lancets lancets    albuterol (VENTOLIN HFA) 108 (90 Base) MCG/ACT inhaler Inhale 2 puffs into the lungs every 6 (six) hours as needed for shortness of breath.    aspirin 81 MG chewable tablet Chew 81 mg by mouth daily.   Blood Glucose Monitoring Suppl (ACCU-CHEK GUIDE ME) w/Device KIT    Calcium Carbonate-Vitamin D 600-200 MG-UNIT TABS Take 1 tablet by mouth daily.    Clobetasol Prop Emollient Base 0.05 % emollient cream Apply 1 application topically 2 (two) times daily.   DULoxetine (CYMBALTA) 60 MG capsule Take 60 mg by mouth 2 (two) times daily.   etanercept (ENBREL) 50 MG/ML injection Inject 50 mg into the skin once a week. Friday   fluocinonide (LIDEX) 0.05 % external solution Apply 1 application topically 2 (two) times daily as needed (skin irritation).    fluticasone (FLONASE) 50 MCG/ACT nasal spray 2 sprays daily as needed for allergies.   folic acid (FOLVITE) 1 MG tablet Take 1 tablet by mouth daily.   [START ON 07/24/2021] HYDROcodone-acetaminophen (NORCO) 7.5-325 MG tablet Take 1 tablet by mouth every 6 (six) hours as needed for moderate pain or severe pain.   [START ON 08/23/2021] HYDROcodone-acetaminophen (NORCO) 7.5-325 MG tablet Take 1 tablet by  mouth every 6 (six) hours as needed for moderate pain or severe pain.   meloxicam (MOBIC) 15 MG tablet Take 15 mg by mouth daily.   metoprolol succinate (TOPROL-XL) 50 MG 24 hr tablet Take 1 tablet (50 mg total) by mouth 2 (two) times daily. Take with or immediately following a meal.   Multiple Vitamin (MULTI-VITAMIN PO) Take 1 tablet by mouth daily.    nystatin (MYCOSTATIN/NYSTOP) powder 1 application daily as needed (skin irritation).    Omega-3 Fatty Acids (FISH OIL PO) Take 1,000 mg by mouth daily.    omeprazole (PRILOSEC) 40 MG capsule Take 40 mg by mouth daily.   OSPHENA 60 MG TABS Take 1 tablet by mouth daily.   rosuvastatin (CRESTOR) 20 MG tablet Take 20 mg by mouth once a week.   Tiotropium Bromide-Olodaterol (STIOLTO RESPIMAT) 2.5-2.5 MCG/ACT AERS 2 puffs   [DISCONTINUED] Fluticasone-Umeclidin-Vilant (TRELEGY ELLIPTA) 200-62.5-25 MCG/ACT AEPB Inhale 1 puff into the lungs daily.   No facility-administered encounter medications on file as of 07/11/2021.   Physical Exam: Blood pressure 140/78, pulse (!) 59, temperature 98.1 F (36.7 C), temperature source Oral, height $RemoveBefo'5\' 7"'anzIWEoEuqd$  (1.702 m), weight 194 lb 6.4 oz (88.2 kg), SpO2 94 %. Gen:      No acute distress HEENT:  EOMI, sclera anicteric Neck:     No masses; no thyromegaly Lungs:    Clear to auscultation bilaterally; normal respiratory effort CV:         Regular rate and rhythm; no murmurs Abd:      + bowel sounds; soft, non-tender; no palpable masses, no distension Ext:    No edema; adequate peripheral perfusion Skin:      Warm and dry; no rash Neuro: alert and oriented x 3 Psych: normal mood and affect   Data Reviewed: PFTs  10/09/11 FVC 3.51 [104%], FEV1 2.20 [89%], F/F 63, TLC 92%, DLCO 52% Mild obstructive airway disease.  Moderate decrease in diffusion capacity.  No bronchodilator response.  05/04/17 FVC 3.20 [101%], FEV1 2.23 [97%], F/F 73, TLC 108%, DLCO 53% Minimal obstructive airway disease, moderate decrease in diffusion capacity.  No bronchodilator response.  Labs CBC 03/25/17-WBC 6.9, eos 3.8%, absolute eosinophil count 300 Allergy profile 03/25/17-IgE 52, RAST panel negative Alpha-1 antitrypsin 03/25/17-193, PIMM  FENO 03/25/17-37 FENO 05/04/17-34  Imaging CT abdomen pelvis 12/01/14- visualized lung bases are clear. CT high-resolution 08/31/2019-no interstitial lung disease, mild to moderate emphysema, aortic, coronary atherosclerosis. CT  high-resolution 04/25/2021-moderate emphysema, no evidence of interstitial lung disease, irregular opacity in the right lower lobe.  Coronary artery disease, hiatal hernia.  I have reviewed the images personally.  PFTs: 08/11/2019 FVC 3.02 [97%), FEV1 2.16 [92%], F/F 71, TLC 7.62 [142%], DLCO 15.86 [77%] Minimal obstructive airway disease  07/11/2021 FVC 2.94 [97%], FEV1 2.13 [93%], F/F 73, TLC 5.51 [102%], DLCO 11.95 [58%] No significant obstruction, moderate diffusion defect.  Assessment:  COPD, moderate persistent asthma  PFTs reviewed. Although she does not have obstruction by F/F criteria at this curvature to the loop suggestive of minimal obstructive airway disease. Reduction in diffusion capacity noted.  There is no evidence of interstitial lung disease on CT scan  Has not tolerated multiple inhalers in the past including Advair, Breo, Spiriva, trelegy Now on Stiolto.   Advised exercise therapy  Rheumatoid arthritis, on Enbrel No evidence of interstitial lung disease on high-res CT.  Abnormal CT Her CT shows right lower lobe opacity.  She got a course of antibiotics from the pulmonary office.  Repeat CT in 1 month to  ensure resolution.  GERD Follows with [facility].  Continues on PPI.  Obstructive sleep apnea Noncompliant with CPAP.  Discussed with patient and she is not interested in retrying CPAP therapy.  Health maintenance 03/16/2013-Prevnar 03/01/2017-Pneumovax  Plan/Recommendations: -Continue stiolto -Follow-up CT in 1 month  Marshell Garfinkel MD [facility] 07/11/2021, 3:09 PM  CC: Harlan Stains, MD

## 2021-07-11 NOTE — Progress Notes (Signed)
Full PFT performed today. °

## 2021-07-11 NOTE — Patient Instructions (Addendum)
We will call in a prescription for stiolto so that he can continue the treatment Order CT chest without contrast in 1 month for reevaluation of the lungs Follow-up in 6 months.

## 2021-07-15 ENCOUNTER — Telehealth: Payer: Self-pay | Admitting: Cardiology

## 2021-07-15 LAB — TOXASSURE SELECT 13 (MW), URINE

## 2021-07-15 MED ORDER — METOPROLOL SUCCINATE ER 25 MG PO TB24: 25.0000 mg | ORAL_TABLET | Freq: Two times a day (BID) | ORAL | 3 refills | Status: DC

## 2021-07-15 NOTE — Telephone Encounter (Signed)
Pt c/o medication issue:  1. Name of Medication:  metoprolol succinate (TOPROL-XL) 50 MG 24 hr tablet  2. How are you currently taking this medication (dosage and times per day)? 25 MG twice a day   3. Are you having a reaction (difficulty breathing--STAT)? No   4. What is your medication issue? Courtney from [facility] is calling stating the daughter of the patient advised their office that the pt is taking this medication as 25 MG's twice daily. They are requesting a new prescription be written reflecting this [facility]. Loma Sousa states the patient is beginning medication packing so they are not wanting to continue cutting it in half.

## 2021-07-21 DIAGNOSIS — N390 Urinary tract infection, site not specified: Secondary | ICD-10-CM | POA: Diagnosis not present

## 2021-07-21 DIAGNOSIS — R300 Dysuria: Secondary | ICD-10-CM | POA: Diagnosis not present

## 2021-07-23 ENCOUNTER — Other Ambulatory Visit: Payer: Medicare HMO

## 2021-08-07 DIAGNOSIS — E1169 Type 2 diabetes mellitus with other specified complication: Secondary | ICD-10-CM | POA: Diagnosis not present

## 2021-08-07 DIAGNOSIS — E785 Hyperlipidemia, unspecified: Secondary | ICD-10-CM | POA: Diagnosis not present

## 2021-08-07 DIAGNOSIS — J441 Chronic obstructive pulmonary disease with (acute) exacerbation: Secondary | ICD-10-CM | POA: Diagnosis not present

## 2021-08-07 DIAGNOSIS — I10 Essential (primary) hypertension: Secondary | ICD-10-CM | POA: Diagnosis not present

## 2021-08-08 ENCOUNTER — Ambulatory Visit
Admission: RE | Admit: 2021-08-08 | Discharge: 2021-08-08 | Disposition: A | Discharge status: Home / Self Care | Payer: Medicare HMO | Source: Ambulatory Visit | Attending: Pulmonary Disease | Admitting: Pulmonary Disease

## 2021-08-08 DIAGNOSIS — R0602 Shortness of breath: Secondary | ICD-10-CM

## 2021-08-08 DIAGNOSIS — I700 Atherosclerosis of aorta: Secondary | ICD-10-CM | POA: Diagnosis not present

## 2021-08-25 ENCOUNTER — Other Ambulatory Visit: Payer: Self-pay | Admitting: *Deleted

## 2021-08-25 ENCOUNTER — Telehealth: Payer: Self-pay | Admitting: Anesthesiology

## 2021-08-25 DIAGNOSIS — I10 Essential (primary) hypertension: Secondary | ICD-10-CM | POA: Diagnosis not present

## 2021-08-25 DIAGNOSIS — J449 Chronic obstructive pulmonary disease, unspecified: Secondary | ICD-10-CM | POA: Diagnosis not present

## 2021-08-25 DIAGNOSIS — E1169 Type 2 diabetes mellitus with other specified complication: Secondary | ICD-10-CM | POA: Diagnosis not present

## 2021-08-25 DIAGNOSIS — M797 Fibromyalgia: Secondary | ICD-10-CM | POA: Diagnosis not present

## 2021-08-25 DIAGNOSIS — Z0000 Encounter for general adult medical examination without abnormal findings: Secondary | ICD-10-CM | POA: Diagnosis not present

## 2021-08-25 DIAGNOSIS — R413 Other amnesia: Secondary | ICD-10-CM | POA: Diagnosis not present

## 2021-08-25 DIAGNOSIS — E785 Hyperlipidemia, unspecified: Secondary | ICD-10-CM | POA: Diagnosis not present

## 2021-08-25 DIAGNOSIS — F324 Major depressive disorder, single episode, in partial remission: Secondary | ICD-10-CM | POA: Diagnosis not present

## 2021-08-25 DIAGNOSIS — R54 Age-related physical debility: Secondary | ICD-10-CM | POA: Diagnosis not present

## 2021-08-25 NOTE — Telephone Encounter (Signed)
Kelsey Diaz called around to find medication and is unable to find her Rx.  States she will wait until Wednesday to see if she can find it then. She will call back on Wednesdsay.

## 2021-08-25 NOTE — Telephone Encounter (Signed)
Patient daughter whats mom meds to be filled at [facility] in [place]. Please give patient a call. Thanks

## 2021-08-27 ENCOUNTER — Other Ambulatory Visit: Payer: Self-pay | Admitting: Family Medicine

## 2021-08-27 DIAGNOSIS — E2839 Other primary ovarian failure: Secondary | ICD-10-CM

## 2021-09-01 ENCOUNTER — Telehealth: Payer: Self-pay | Admitting: Anesthesiology

## 2021-09-01 NOTE — Telephone Encounter (Signed)
Patient daughter called to see if its another med that can be prescribed for her mother to take. Patient daughter stated that she has been calling around for an week to varies of pharmacy. Please give patient's daughter Otila Kluver a call. Thanks

## 2021-09-01 NOTE — Telephone Encounter (Signed)
Will ask Dr. Andree Elk tomorrow about alternative med. Meanwhile, patient's daughter will call other local pharmacies in effort to locate Hydrocodone 7.5 mg

## 2021-09-02 ENCOUNTER — Other Ambulatory Visit: Payer: Self-pay | Admitting: *Deleted

## 2021-09-02 ENCOUNTER — Telehealth: Payer: Self-pay | Admitting: Anesthesiology

## 2021-09-02 NOTE — Telephone Encounter (Signed)
Patient's Daughter Otila Kluver called asking to have her mothers pain meds changed to [facility], they can fill the script. Can you see if Dr. Andree Elk will do this today please. Thanks

## 2021-09-02 NOTE — Telephone Encounter (Signed)
Rx request sent to [facility]

## 2021-09-03 ENCOUNTER — Other Ambulatory Visit: Payer: Self-pay | Admitting: *Deleted

## 2021-09-03 ENCOUNTER — Other Ambulatory Visit: Payer: Self-pay

## 2021-09-03 ENCOUNTER — Telehealth: Payer: Self-pay | Admitting: Anesthesiology

## 2021-09-03 DIAGNOSIS — J449 Chronic obstructive pulmonary disease, unspecified: Secondary | ICD-10-CM

## 2021-09-03 DIAGNOSIS — R0602 Shortness of breath: Secondary | ICD-10-CM

## 2021-09-03 MED ORDER — HYDROCODONE-ACETAMINOPHEN 7.5-325 MG PO TABS: 1.0000 | ORAL_TABLET | Freq: Four times a day (QID) | ORAL | 0 refills | Status: DC | PRN

## 2021-09-03 NOTE — Telephone Encounter (Signed)
Patient daughter stated that her mother is out of meds and was due for an refill last week. Will like for refill to be send to [facility]. Please give daugther a call.Thanks

## 2021-09-03 NOTE — Telephone Encounter (Signed)
Refill request and bubble sent to Dr Andree Elk about the medication needing to be changed to a different pharmacy.

## 2021-09-04 DIAGNOSIS — N3946 Mixed incontinence: Secondary | ICD-10-CM | POA: Diagnosis not present

## 2021-09-04 DIAGNOSIS — N993 Prolapse of vaginal vault after hysterectomy: Secondary | ICD-10-CM | POA: Diagnosis not present

## 2021-09-05 ENCOUNTER — Other Ambulatory Visit: Payer: Self-pay

## 2021-09-05 ENCOUNTER — Other Ambulatory Visit: Payer: Self-pay | Admitting: Anesthesiology

## 2021-09-05 MED ORDER — HYDROCODONE-ACETAMINOPHEN 7.5-325 MG PO TABS: 1.0000 | ORAL_TABLET | Freq: Four times a day (QID) | ORAL | 0 refills | Status: AC | PRN

## 2021-09-09 DIAGNOSIS — E1169 Type 2 diabetes mellitus with other specified complication: Secondary | ICD-10-CM | POA: Diagnosis not present

## 2021-09-09 DIAGNOSIS — J441 Chronic obstructive pulmonary disease with (acute) exacerbation: Secondary | ICD-10-CM | POA: Diagnosis not present

## 2021-09-09 DIAGNOSIS — I10 Essential (primary) hypertension: Secondary | ICD-10-CM | POA: Diagnosis not present

## 2021-09-09 DIAGNOSIS — E785 Hyperlipidemia, unspecified: Secondary | ICD-10-CM | POA: Diagnosis not present

## 2021-09-15 DIAGNOSIS — I10 Essential (primary) hypertension: Secondary | ICD-10-CM | POA: Diagnosis not present

## 2021-09-15 DIAGNOSIS — J449 Chronic obstructive pulmonary disease, unspecified: Secondary | ICD-10-CM | POA: Diagnosis not present

## 2021-09-15 DIAGNOSIS — M1990 Unspecified osteoarthritis, unspecified site: Secondary | ICD-10-CM | POA: Diagnosis not present

## 2021-09-15 DIAGNOSIS — M25569 Pain in unspecified knee: Secondary | ICD-10-CM | POA: Diagnosis not present

## 2021-09-15 DIAGNOSIS — M069 Rheumatoid arthritis, unspecified: Secondary | ICD-10-CM | POA: Diagnosis not present

## 2021-09-15 DIAGNOSIS — R5383 Other fatigue: Secondary | ICD-10-CM | POA: Diagnosis not present

## 2021-09-15 DIAGNOSIS — Z79899 Other long term (current) drug therapy: Secondary | ICD-10-CM | POA: Diagnosis not present

## 2021-09-15 DIAGNOSIS — M25511 Pain in right shoulder: Secondary | ICD-10-CM | POA: Diagnosis not present

## 2021-09-25 ENCOUNTER — Telehealth: Payer: Self-pay | Admitting: Pulmonary Disease

## 2021-09-25 NOTE — Telephone Encounter (Signed)
Called and spoke with Joellen Jersey. She stated that the patient had a visit with their pharmacist yesterday and mentioned that she has been having more daytime sleepiness. The pharmacist suggested that she resume using her cpap machine and the patient was agreeable. However, she has been off on the machine for at least 8 years and they wanted to know if the machine needed to be re-calibrated.   I advised Joellen Jersey that since the patient has had a break in therapy that she would probably need to have another sleep study to confirm she has OSA and start over with a new cpap machine. She agreed. She would like for [place] to reach out to the patient as well.   Dr. Vaughan Browner, do you have any recommendations?

## 2021-09-26 NOTE — Telephone Encounter (Signed)
Called pt and spoke with pt's daughter Otila Kluver letting her know the info per Dr. Vaughan Browner. While speaking with Otila Kluver, she stated that pt did not want to resume CPAP rather it was the provider at [facility] that was requesting that she resume it due to not sleeping well at night. Nothing further needed.

## 2021-09-26 NOTE — Telephone Encounter (Signed)
Yes, She will need a new sleep study as it has been a long time since she used the CPAP. Please order a home sleep study

## 2021-10-06 DIAGNOSIS — E785 Hyperlipidemia, unspecified: Secondary | ICD-10-CM | POA: Diagnosis not present

## 2021-10-06 DIAGNOSIS — I10 Essential (primary) hypertension: Secondary | ICD-10-CM | POA: Diagnosis not present

## 2021-10-06 DIAGNOSIS — J449 Chronic obstructive pulmonary disease, unspecified: Secondary | ICD-10-CM | POA: Diagnosis not present

## 2021-10-06 DIAGNOSIS — E1169 Type 2 diabetes mellitus with other specified complication: Secondary | ICD-10-CM | POA: Diagnosis not present

## 2021-10-30 ENCOUNTER — Ambulatory Visit: Payer: Medicare HMO | Attending: Anesthesiology | Admitting: Anesthesiology

## 2021-10-30 DIAGNOSIS — F1190 Opioid use, unspecified, uncomplicated: Secondary | ICD-10-CM

## 2021-10-30 DIAGNOSIS — M5136 Other intervertebral disc degeneration, lumbar region: Secondary | ICD-10-CM

## 2021-10-30 DIAGNOSIS — M47817 Spondylosis without myelopathy or radiculopathy, lumbosacral region: Secondary | ICD-10-CM

## 2021-10-30 DIAGNOSIS — M5431 Sciatica, right side: Secondary | ICD-10-CM | POA: Diagnosis not present

## 2021-10-30 DIAGNOSIS — G894 Chronic pain syndrome: Secondary | ICD-10-CM

## 2021-10-30 DIAGNOSIS — M25551 Pain in right hip: Secondary | ICD-10-CM | POA: Diagnosis not present

## 2021-10-30 DIAGNOSIS — M797 Fibromyalgia: Secondary | ICD-10-CM

## 2021-10-30 DIAGNOSIS — M25552 Pain in left hip: Secondary | ICD-10-CM

## 2021-10-30 DIAGNOSIS — M5432 Sciatica, left side: Secondary | ICD-10-CM

## 2021-10-30 DIAGNOSIS — M0579 Rheumatoid arthritis with rheumatoid factor of multiple sites without organ or systems involvement: Secondary | ICD-10-CM | POA: Diagnosis not present

## 2021-10-30 DIAGNOSIS — M4716 Other spondylosis with myelopathy, lumbar region: Secondary | ICD-10-CM

## 2021-10-30 MED ORDER — HYDROCODONE-ACETAMINOPHEN 7.5-325 MG PO TABS
1.0000 | ORAL_TABLET | Freq: Four times a day (QID) | ORAL | 0 refills | Status: AC | PRN
Start: 2021-11-01 — End: 2021-12-01

## 2021-10-30 MED ORDER — HYDROCODONE-ACETAMINOPHEN 7.5-325 MG PO TABS: 1.0000 | ORAL_TABLET | Freq: Four times a day (QID) | ORAL | 0 refills | Status: DC | PRN

## 2021-10-30 NOTE — Progress Notes (Signed)
Virtual Visit via Telephone Note  I connected with Kelsey Diaz on 10/30/21 at  3:20 PM EDT by telephone and verified that I am speaking with the correct person using two identifiers.  Location: Patient: Home Provider: [facility] control center   I discussed the limitations, risks, security and privacy concerns of performing an evaluation and management service by telephone and the availability of in person appointments. I also discussed with the patient that there may be a patient responsible charge related to this service. The patient expressed understanding and agreed to proceed.   History of Present Illness: I spoke with Kelsey Diaz regarding her generalized low back pain and leg pain.  The quality characteristic and distribution of this has been stable in nature with no recent changes.  She has recently started on Enbrel for her rheumatoid arthritis and overall that is helping in addition to her chronic opioid therapy that she is taking.  No side effects of the medications are noted.  She generally gets about 75% improvement lasting about 4 to 6 hours with her medicines when she takes them.  Otherwise she is in her usual state of health with no new changes.  Review of systems: General: No fevers or chills Pulmonary: No shortness of breath or dyspnea Cardiac: No angina or palpitations or lightheadedness GI: No abdominal pain or constipation Psych: No depression    Observations/Objective:  Current Outpatient Medications:    [START ON 11/01/2021] HYDROcodone-acetaminophen (NORCO) 7.5-325 MG tablet, Take 1 tablet by mouth every 6 (six) hours as needed for moderate pain or severe pain., Disp: 120 tablet, Rfl: 0   [START ON 12/01/2021] HYDROcodone-acetaminophen (NORCO) 7.5-325 MG tablet, Take 1 tablet by mouth every 6 (six) hours as needed for moderate pain or severe pain., Disp: 120 tablet, Rfl: 0   ACCU-CHEK GUIDE test strip, , Disp: , Rfl:    Accu-Chek Softclix Lancets lancets, , Disp: ,  Rfl:    albuterol (VENTOLIN HFA) 108 (90 Base) MCG/ACT inhaler, Inhale 2 puffs into the lungs every 6 (six) hours as needed for shortness of breath. , Disp: , Rfl:    aspirin 81 MG chewable tablet, Chew 81 mg by mouth daily., Disp: , Rfl:    Blood Glucose Monitoring Suppl (ACCU-CHEK GUIDE ME) w/Device KIT, , Disp: , Rfl:    Calcium Carbonate-Vitamin D 600-200 MG-UNIT TABS, Take 1 tablet by mouth daily. , Disp: , Rfl:    Clobetasol Prop Emollient Base 0.05 % emollient cream, Apply 1 application topically 2 (two) times daily., Disp: , Rfl:    DULoxetine (CYMBALTA) 60 MG capsule, Take 60 mg by mouth 2 (two) times daily., Disp: , Rfl:    etanercept (ENBREL) 50 MG/ML injection, Inject 50 mg into the skin once a week. Friday, Disp: , Rfl:    fluocinonide (LIDEX) 0.05 % external solution, Apply 1 application topically 2 (two) times daily as needed (skin irritation). , Disp: , Rfl:    fluticasone (FLONASE) 50 MCG/ACT nasal spray, 2 sprays daily as needed for allergies., Disp: , Rfl:    folic acid (FOLVITE) 1 MG tablet, Take 1 tablet by mouth daily., Disp: , Rfl:    meloxicam (MOBIC) 15 MG tablet, Take 15 mg by mouth daily., Disp: , Rfl:    metoprolol succinate (TOPROL XL) 25 MG 24 hr tablet, Take 1 tablet (25 mg total) by mouth 2 (two) times daily., Disp: 180 tablet, Rfl: 3   Multiple Vitamin (MULTI-VITAMIN PO), Take 1 tablet by mouth daily. , Disp: , Rfl:  nystatin (MYCOSTATIN/NYSTOP) powder, 1 application daily as needed (skin irritation)., Disp: , Rfl:    Omega-3 Fatty Acids (FISH OIL PO), Take 1,000 mg by mouth daily. , Disp: , Rfl:    omeprazole (PRILOSEC) 40 MG capsule, Take 40 mg by mouth daily., Disp: , Rfl: 1   OSPHENA 60 MG TABS, Take 1 tablet by mouth daily., Disp: , Rfl:    rosuvastatin (CRESTOR) 20 MG tablet, Take 20 mg by mouth once a week., Disp: , Rfl:    Tiotropium Bromide-Olodaterol (STIOLTO RESPIMAT) 2.5-2.5 MCG/ACT AERS, Inhale 2 puffs into the lungs daily., Disp: 4 g, Rfl: 11    Past Medical History:  Diagnosis Date   Allergic rhinitis    Anxiety    Asthma    Bilateral hip pain 11/28/2018   Colitis    COPD (chronic obstructive pulmonary disease) ([place])    Fibromyalgia 05/16/2019   GERD (gastroesophageal reflux disease)    With esophageal strictures   Hypertension    Hyperthyroidism    OSA (obstructive sleep apnea) 02/09/2012   No mask.  Did not tolerate   Osteoarthritis    Rheumatoid arthritis ([place]) 10/03/2018     Assessment and Plan:  1. DDD (degenerative disc disease), lumbar   2. Bilateral sciatica   3. Bilateral hip pain   4. Lumbar spondylosis with myelopathy   5. Chronic, continuous use of opioids   6. Fibromyalgia   7. Chronic pain syndrome   8. Rheumatoid arthritis involving multiple sites with positive rheumatoid factor ([place])   9. Facet arthritis of lumbosacral region   Based on our discussion I think is appropriate refilling medicines for the next 2 months dated for September 9 and October 9.  No other changes in her pharmacologic regimen will be initiated.  I have encouraged her to continue with her Enbrel and continue with her physical therapy activities as reviewed.  Continue follow-up with her primary care physician for baseline medical care with return to clinic in 2 months. Follow Up Instructions:    I discussed the assessment and treatment plan with the patient. The patient was provided an opportunity to ask questions and all were answered. The patient agreed with the plan and demonstrated an understanding of the instructions.   The patient was advised to call back or seek an in-person evaluation if the symptoms worsen or if the condition fails to improve as anticipated.  I provided 30 minutes of non-face-to-face time during this encounter.   Molli Barrows, MD

## 2021-11-05 DIAGNOSIS — N952 Postmenopausal atrophic vaginitis: Secondary | ICD-10-CM | POA: Diagnosis not present

## 2021-11-05 DIAGNOSIS — L900 Lichen sclerosus et atrophicus: Secondary | ICD-10-CM | POA: Diagnosis not present

## 2021-11-06 DIAGNOSIS — E1169 Type 2 diabetes mellitus with other specified complication: Secondary | ICD-10-CM | POA: Diagnosis not present

## 2021-11-06 DIAGNOSIS — J449 Chronic obstructive pulmonary disease, unspecified: Secondary | ICD-10-CM | POA: Diagnosis not present

## 2021-11-06 DIAGNOSIS — E785 Hyperlipidemia, unspecified: Secondary | ICD-10-CM | POA: Diagnosis not present

## 2021-11-06 DIAGNOSIS — I10 Essential (primary) hypertension: Secondary | ICD-10-CM | POA: Diagnosis not present

## 2021-12-22 DIAGNOSIS — E785 Hyperlipidemia, unspecified: Secondary | ICD-10-CM | POA: Diagnosis not present

## 2021-12-22 DIAGNOSIS — J441 Chronic obstructive pulmonary disease with (acute) exacerbation: Secondary | ICD-10-CM | POA: Diagnosis not present

## 2021-12-22 DIAGNOSIS — I10 Essential (primary) hypertension: Secondary | ICD-10-CM | POA: Diagnosis not present

## 2021-12-22 DIAGNOSIS — E1169 Type 2 diabetes mellitus with other specified complication: Secondary | ICD-10-CM | POA: Diagnosis not present

## 2021-12-23 DIAGNOSIS — I10 Essential (primary) hypertension: Secondary | ICD-10-CM | POA: Diagnosis not present

## 2021-12-23 DIAGNOSIS — M1990 Unspecified osteoarthritis, unspecified site: Secondary | ICD-10-CM | POA: Diagnosis not present

## 2021-12-23 DIAGNOSIS — Z79899 Other long term (current) drug therapy: Secondary | ICD-10-CM | POA: Diagnosis not present

## 2021-12-23 DIAGNOSIS — M542 Cervicalgia: Secondary | ICD-10-CM | POA: Diagnosis not present

## 2021-12-23 DIAGNOSIS — R350 Frequency of micturition: Secondary | ICD-10-CM | POA: Diagnosis not present

## 2021-12-23 DIAGNOSIS — J449 Chronic obstructive pulmonary disease, unspecified: Secondary | ICD-10-CM | POA: Diagnosis not present

## 2021-12-23 DIAGNOSIS — Z23 Encounter for immunization: Secondary | ICD-10-CM | POA: Diagnosis not present

## 2021-12-23 DIAGNOSIS — M069 Rheumatoid arthritis, unspecified: Secondary | ICD-10-CM | POA: Diagnosis not present

## 2021-12-23 DIAGNOSIS — M25569 Pain in unspecified knee: Secondary | ICD-10-CM | POA: Diagnosis not present

## 2021-12-30 ENCOUNTER — Ambulatory Visit: Payer: Medicare HMO | Attending: Anesthesiology | Admitting: Anesthesiology

## 2021-12-30 ENCOUNTER — Encounter: Payer: Self-pay | Admitting: Anesthesiology

## 2021-12-30 ENCOUNTER — Other Ambulatory Visit: Payer: Self-pay

## 2021-12-30 VITALS — BP 126/104 | HR 70 | Temp 97.2°F | Resp 18 | Ht 66.5 in | Wt 194.0 lb

## 2021-12-30 DIAGNOSIS — M5136 Other intervertebral disc degeneration, lumbar region: Secondary | ICD-10-CM | POA: Diagnosis not present

## 2021-12-30 DIAGNOSIS — M47817 Spondylosis without myelopathy or radiculopathy, lumbosacral region: Secondary | ICD-10-CM | POA: Diagnosis not present

## 2021-12-30 DIAGNOSIS — M797 Fibromyalgia: Secondary | ICD-10-CM | POA: Insufficient documentation

## 2021-12-30 DIAGNOSIS — M25551 Pain in right hip: Secondary | ICD-10-CM | POA: Diagnosis not present

## 2021-12-30 DIAGNOSIS — F1190 Opioid use, unspecified, uncomplicated: Secondary | ICD-10-CM | POA: Insufficient documentation

## 2021-12-30 DIAGNOSIS — M4716 Other spondylosis with myelopathy, lumbar region: Secondary | ICD-10-CM | POA: Diagnosis not present

## 2021-12-30 DIAGNOSIS — M5431 Sciatica, right side: Secondary | ICD-10-CM | POA: Insufficient documentation

## 2021-12-30 DIAGNOSIS — M0579 Rheumatoid arthritis with rheumatoid factor of multiple sites without organ or systems involvement: Secondary | ICD-10-CM | POA: Diagnosis not present

## 2021-12-30 DIAGNOSIS — G894 Chronic pain syndrome: Secondary | ICD-10-CM | POA: Insufficient documentation

## 2021-12-30 DIAGNOSIS — M25552 Pain in left hip: Secondary | ICD-10-CM | POA: Diagnosis not present

## 2021-12-30 DIAGNOSIS — M5432 Sciatica, left side: Secondary | ICD-10-CM | POA: Insufficient documentation

## 2021-12-30 MED ORDER — HYDROCODONE-ACETAMINOPHEN 7.5-325 MG PO TABS: 1.0000 | ORAL_TABLET | Freq: Four times a day (QID) | ORAL | 0 refills | Status: AC | PRN

## 2021-12-30 NOTE — Patient Instructions (Signed)

## 2021-12-30 NOTE — Progress Notes (Signed)
Nursing Pain Medication Assessment:  Safety precautions to be maintained throughout the outpatient stay will include: orient to surroundings, keep bed in low position, maintain call bell within reach at all times, provide assistance with transfer out of bed and ambulation.  Medication Inspection Compliance: Pill count conducted under aseptic conditions, in front of the patient. Neither the pills nor the bottle was removed from the patient's sight at any time. Once count was completed pills were immediately returned to the patient in their original bottle.  Medication: Hydrocodone/APAP Pill/Patch Count:  2 of 120 pills remain Pill/Patch Appearance: Markings consistent with prescribed medication Bottle Appearance: Standard pharmacy container. Clearly labeled. Filled Date: 09 / 13 / 2023 Last Medication intake:  Today

## 2021-12-31 NOTE — Progress Notes (Signed)
Subjective:  Patient ID: Kelsey Diaz, female    DOB: [birth year]  Age: 76 y.o. MRN: [id number]  CC: Back Pain (lower)   Procedure: None  HPI Kelsey Diaz presents for reevaluation.  Andreana continues to have low back pain and intermittent lower extremity pain.  No recent changes in quality characteristic or distribution are reported.  The pain she experiences goes from the low back into both hips buttocks down the posterior legs.  It is worse when she increases her activity but generally well managed with her medications.  She is taking them as prescribed with no side effects reported.  Lower extremity strength function bowel and bladder function are stable in nature with no recent change.  She reports that she takes her medications as prescribed and generally gets 4 to 6 hours of significant improvement rated at 75 to 80%.  Otherwise she is in her usual state of health.  Outpatient Medications Prior to Visit  Medication Sig Dispense Refill   ACCU-CHEK GUIDE test strip      Accu-Chek Softclix Lancets lancets      albuterol (VENTOLIN HFA) 108 (90 Base) MCG/ACT inhaler Inhale 2 puffs into the lungs every 6 (six) hours as needed for shortness of breath.      aspirin 81 MG chewable tablet Chew 81 mg by mouth daily.     Blood Glucose Monitoring Suppl (ACCU-CHEK GUIDE ME) w/Device KIT      Calcium Carbonate-Vitamin D 600-200 MG-UNIT TABS Take 1 tablet by mouth daily.      Clobetasol Prop Emollient Base 0.05 % emollient cream Apply 1 application topically 2 (two) times daily.     DULoxetine (CYMBALTA) 60 MG capsule Take 60 mg by mouth 2 (two) times daily.     etanercept (ENBREL) 50 MG/ML injection Inject 50 mg into the skin once a week. Friday     fluocinonide (LIDEX) 0.05 % external solution Apply 1 application topically 2 (two) times daily as needed (skin irritation).      fluticasone (FLONASE) 50 MCG/ACT nasal spray 2 sprays daily as needed for allergies.     folic acid (FOLVITE) 1 MG  tablet Take 1 tablet by mouth daily.     meloxicam (MOBIC) 15 MG tablet Take 15 mg by mouth daily.     metoprolol succinate (TOPROL XL) 25 MG 24 hr tablet Take 1 tablet (25 mg total) by mouth 2 (two) times daily. 180 tablet 3   Multiple Vitamin (MULTI-VITAMIN PO) Take 1 tablet by mouth daily.      nystatin (MYCOSTATIN/NYSTOP) powder 1 application daily as needed (skin irritation).     Omega-3 Fatty Acids (FISH OIL PO) Take 1,000 mg by mouth daily.      omeprazole (PRILOSEC) 40 MG capsule Take 40 mg by mouth daily.  1   OSPHENA 60 MG TABS Take 1 tablet by mouth daily.     rosuvastatin (CRESTOR) 20 MG tablet Take 20 mg by mouth once a week.     Tiotropium Bromide-Olodaterol (STIOLTO RESPIMAT) 2.5-2.5 MCG/ACT AERS Inhale 2 puffs into the lungs daily. 4 g 11   HYDROcodone-acetaminophen (NORCO) 7.5-325 MG tablet Take 1 tablet by mouth every 6 (six) hours as needed for moderate pain or severe pain. 120 tablet 0   No facility-administered medications prior to visit.    Review of Systems CNS: No confusion or sedation Cardiac: No angina or palpitations GI: No abdominal pain or constipation Constitutional: No nausea vomiting fevers or chills  Objective:  BP (!) 126/104  Pulse 70   Temp (!) 97.2 F (36.2 C) (Temporal)   Resp 18   Ht 5' 6.5" (1.689 m)   Wt 194 lb (88 kg)   SpO2 95%   BMI 30.84 kg/m    BP Readings from Last 3 Encounters:  12/30/21 (!) 126/104  07/11/21 116/70  04/21/21 140/78     Wt Readings from Last 3 Encounters:  12/30/21 194 lb (88 kg)  07/11/21 191 lb (86.6 kg)  04/21/21 194 lb 6.4 oz (88.2 kg)     Physical Exam Pt is alert and oriented PERRL EOMI HEART IS RRR no murmur or rub LCTA no wheezing or rales MUSCULOSKELETAL reveals some paraspinous muscle tenderness in the lumbar region but no overt trigger points.  She does have mild difficulty going from seated to standing.  This causes pain in her low back.  She walks with a mildly antalgic gait but her  strength appears to be well preserved  Labs  Lab Results  Component Value Date   HGBA1C 6.1 (H) 12/11/2020   Lab Results  Component Value Date   LDLCALC 39 12/11/2020   CREATININE 0.61 04/05/2021    -------------------------------------------------------------------------------------------------------------------- Lab Results  Component Value Date   WBC 8.6 04/04/2021   HGB 13.0 04/04/2021   HCT 38.2 04/04/2021   PLT 149 (L) 04/04/2021   GLUCOSE 116 (H) 04/05/2021   CHOL 113 12/11/2020   TRIG 127 12/11/2020   HDL 49 12/11/2020   LDLCALC 39 12/11/2020   ALT 14 04/04/2021   AST 16 04/04/2021   NA 138 04/05/2021   K 3.7 04/05/2021   CL 106 04/05/2021   CREATININE 0.61 04/05/2021   BUN 9 04/05/2021   CO2 24 04/05/2021   TSH 3.320 03/04/2021   INR 1.0 04/04/2021   HGBA1C 6.1 (H) 12/11/2020    --------------------------------------------------------------------------------------------------------------------- CT Chest Wo Contrast  Result Date: 08/11/2021 CLINICAL DATA:  Shortness of breath, right lower lobe opacity EXAM: CT CHEST WITHOUT CONTRAST TECHNIQUE: Multidetector CT imaging of the chest was performed following the standard protocol without IV contrast. RADIATION DOSE REDUCTION: This exam was performed according to the departmental dose-optimization program which includes automated exposure control, adjustment of the mA and/or kV according to patient size and/or use of iterative reconstruction technique. COMPARISON:  04/24/2021 FINDINGS: Cardiovascular: Aortic atherosclerosis. Normal heart size. Left and right coronary artery calcifications. No pericardial effusion. Mediastinum/Nodes: No enlarged mediastinal, hilar, or axillary lymph nodes. Moderate hiatal hernia with intrathoracic position of the gastric body and fundus. Thyroid gland, trachea, and esophagus demonstrate no significant findings. Lungs/Pleura: Moderate centrilobular and paraseptal emphysema. No significant  change in an irregular opacity of the dependent right lower lobe superimposed upon emphysema, difficult to accurately measure due to amorphous nature although measuring 1.8 x 1.3 cm (series 3, image 74). No pleural effusion or pneumothorax. Upper Abdomen: No acute abnormality. Somewhat coarse, nodular contour of the liver. Musculoskeletal: No chest wall abnormality. No suspicious osseous lesions identified. IMPRESSION: 1. No significant change in an irregular opacity of the dependent right lower lobe superimposed upon emphysema, difficult to accurately measure due to amorphous nature although measuring approximately 1.8 x 1.3 cm. This finding is most consistent with sequela of infection or inflammation. 2. Moderate emphysema. 3. Coronary artery disease. 4. Somewhat coarse, nodular contour of the liver in the included upper abdomen, suggestive of cirrhosis. Correlate with clinical history. Aortic Atherosclerosis (ICD10-I70.0) and Emphysema (ICD10-J43.9). Electronically Signed   By: Delanna Ahmadi M.D.   On: 08/11/2021 14:04     Assessment &  Plan:   Jurnei was seen today for back pain.  Diagnoses and all orders for this visit:  DDD (degenerative disc disease), lumbar -     Lumbar Epidural Injection; Future -     ToxASSURE Select 13 (MW), Urine  Bilateral sciatica -     Lumbar Epidural Injection; Future -     ToxASSURE Select 13 (MW), Urine  Bilateral hip pain -     ToxASSURE Select 13 (MW), Urine  Lumbar spondylosis with myelopathy -     Lumbar Epidural Injection; Future -     ToxASSURE Select 13 (MW), Urine  Chronic, continuous use of opioids -     ToxASSURE Select 13 (MW), Urine  Fibromyalgia -     ToxASSURE Select 13 (MW), Urine  Chronic pain syndrome -     ToxASSURE Select 13 (MW), Urine  Rheumatoid arthritis involving multiple sites with positive rheumatoid factor (HCC) -     ToxASSURE Select 13 (MW), Urine  Facet arthritis of lumbosacral region -     ToxASSURE Select 13 (MW),  Urine  Other orders -     HYDROcodone-acetaminophen (NORCO) 7.5-325 MG tablet; Take 1 tablet by mouth every 6 (six) hours as needed for moderate pain or severe pain. -     HYDROcodone-acetaminophen (NORCO) 7.5-325 MG tablet; Take 1 tablet by mouth every 6 (six) hours as needed for moderate pain or severe pain.        ----------------------------------------------------------------------------------------------------------------------  Problem List Items Addressed This Visit       Unprioritized   Bilateral hip pain   Relevant Orders   ToxASSURE Select 13 (MW), Urine   Fibromyalgia   Relevant Medications   HYDROcodone-acetaminophen (NORCO) 7.5-325 MG tablet   HYDROcodone-acetaminophen (NORCO) 7.5-325 MG tablet (Start on 01/29/2022)   Other Relevant Orders   ToxASSURE Select 13 (MW), Urine   Rheumatoid arthritis ([place])   Relevant Medications   HYDROcodone-acetaminophen (NORCO) 7.5-325 MG tablet   HYDROcodone-acetaminophen (NORCO) 7.5-325 MG tablet (Start on 01/29/2022)   Other Relevant Orders   ToxASSURE Select 13 (MW), Urine   Other Visit Diagnoses     DDD (degenerative disc disease), lumbar    -  Primary   Relevant Medications   HYDROcodone-acetaminophen (NORCO) 7.5-325 MG tablet   HYDROcodone-acetaminophen (NORCO) 7.5-325 MG tablet (Start on 01/29/2022)   Other Relevant Orders   Lumbar Epidural Injection   ToxASSURE Select 13 (MW), Urine   Bilateral sciatica       Relevant Orders   Lumbar Epidural Injection   ToxASSURE Select 13 (MW), Urine   Lumbar spondylosis with myelopathy       Relevant Medications   HYDROcodone-acetaminophen (NORCO) 7.5-325 MG tablet   HYDROcodone-acetaminophen (NORCO) 7.5-325 MG tablet (Start on 01/29/2022)   Other Relevant Orders   Lumbar Epidural Injection   ToxASSURE Select 13 (MW), Urine   Chronic, continuous use of opioids       Relevant Orders   ToxASSURE Select 13 (MW), Urine   Chronic pain syndrome       Relevant Medications    HYDROcodone-acetaminophen (NORCO) 7.5-325 MG tablet   HYDROcodone-acetaminophen (NORCO) 7.5-325 MG tablet (Start on 01/29/2022)   Other Relevant Orders   ToxASSURE Select 13 (MW), Urine   Facet arthritis of lumbosacral region       Relevant Medications   HYDROcodone-acetaminophen (NORCO) 7.5-325 MG tablet   HYDROcodone-acetaminophen (NORCO) 7.5-325 MG tablet (Start on 01/29/2022)   Other Relevant Orders   ToxASSURE Select 13 (MW), Urine         ----------------------------------------------------------------------------------------------------------------------  1. DDD (degenerative disc disease), lumbar Continue with core stretching strengthening exercises.  We will schedule her for an epidural steroid injection to help with some of the sciatica within the next several weeks per patient request.  She has responded favorably to these in the past and generally gets 2 to 4 months of 75% improvement in her sciatica symptoms and low back pain.  In the meantime continue core stretching strengthening exercises as reviewed today. - Lumbar Epidural Injection; Future - ToxASSURE Select 13 (MW), Urine  2. Bilateral sciatica As above - Lumbar Epidural Injection; Future - ToxASSURE Select 13 (MW), Urine  3. Bilateral hip pain As above - ToxASSURE Select 13 (MW), Urine  4. Lumbar spondylosis with myelopathy  - Lumbar Epidural Injection; Future - ToxASSURE Select 13 (MW), Urine  5. Chronic, continuous use of opioids I have reviewed the [facility] practitioner database information and it is appropriate for refill for the next 2 months. - ToxASSURE Select 13 (MW), Urine  6. Fibromyalgia  - ToxASSURE Select 13 (MW), Urine  7. Chronic pain syndrome  - ToxASSURE Select 13 (MW), Urine  8. Rheumatoid arthritis involving multiple sites with positive rheumatoid factor (HCC)  - ToxASSURE Select 13 (MW), Urine  9. Facet arthritis of lumbosacral region  - ToxASSURE Select 13 (MW),  Urine    ----------------------------------------------------------------------------------------------------------------------  I am having Beckie Busing. Hynes start on HYDROcodone-acetaminophen. I am also having her maintain her Calcium Carbonate-Vitamin D, Multiple Vitamin (MULTI-VITAMIN PO), albuterol, Omega-3 Fatty Acids (FISH OIL PO), fluocinonide, omeprazole, Clobetasol Prop Emollient Base, Osphena, Accu-Chek Guide Me, Accu-Chek Guide, Accu-Chek Softclix Lancets, Enbrel, meloxicam, rosuvastatin, folic acid, aspirin, DULoxetine, fluticasone, nystatin, Stiolto Respimat, metoprolol succinate, and HYDROcodone-acetaminophen.   Meds ordered this encounter  Medications   HYDROcodone-acetaminophen (NORCO) 7.5-325 MG tablet    Sig: Take 1 tablet by mouth every 6 (six) hours as needed for moderate pain or severe pain.    Dispense:  120 tablet    Refill:  0   HYDROcodone-acetaminophen (NORCO) 7.5-325 MG tablet    Sig: Take 1 tablet by mouth every 6 (six) hours as needed for moderate pain or severe pain.    Dispense:  120 tablet    Refill:  0   Patient's Medications  New Prescriptions   HYDROCODONE-ACETAMINOPHEN (NORCO) 7.5-325 MG TABLET    Take 1 tablet by mouth every 6 (six) hours as needed for moderate pain or severe pain.  Previous Medications   ACCU-CHEK GUIDE TEST STRIP       ACCU-CHEK SOFTCLIX LANCETS LANCETS       ALBUTEROL (VENTOLIN HFA) 108 (90 BASE) MCG/ACT INHALER    Inhale 2 puffs into the lungs every 6 (six) hours as needed for shortness of breath.    ASPIRIN 81 MG CHEWABLE TABLET    Chew 81 mg by mouth daily.   BLOOD GLUCOSE MONITORING SUPPL (ACCU-CHEK GUIDE ME) W/DEVICE KIT       CALCIUM CARBONATE-VITAMIN D 600-200 MG-UNIT TABS    Take 1 tablet by mouth daily.    CLOBETASOL PROP EMOLLIENT BASE 0.05 % EMOLLIENT CREAM    Apply 1 application topically 2 (two) times daily.   DULOXETINE (CYMBALTA) 60 MG CAPSULE    Take 60 mg by mouth 2 (two) times daily.   ETANERCEPT (ENBREL) 50  MG/ML INJECTION    Inject 50 mg into the skin once a week. Friday   FLUOCINONIDE (LIDEX) 0.05 % EXTERNAL SOLUTION    Apply 1 application topically 2 (two) times daily as needed (skin irritation).  FLUTICASONE (FLONASE) 50 MCG/ACT NASAL SPRAY    2 sprays daily as needed for allergies.   FOLIC ACID (FOLVITE) 1 MG TABLET    Take 1 tablet by mouth daily.   MELOXICAM (MOBIC) 15 MG TABLET    Take 15 mg by mouth daily.   METOPROLOL SUCCINATE (TOPROL XL) 25 MG 24 HR TABLET    Take 1 tablet (25 mg total) by mouth 2 (two) times daily.   MULTIPLE VITAMIN (MULTI-VITAMIN PO)    Take 1 tablet by mouth daily.    NYSTATIN (MYCOSTATIN/NYSTOP) POWDER    1 application daily as needed (skin irritation).   OMEGA-3 FATTY ACIDS (FISH OIL PO)    Take 1,000 mg by mouth daily.    OMEPRAZOLE (PRILOSEC) 40 MG CAPSULE    Take 40 mg by mouth daily.   OSPHENA 60 MG TABS    Take 1 tablet by mouth daily.   ROSUVASTATIN (CRESTOR) 20 MG TABLET    Take 20 mg by mouth once a week.   TIOTROPIUM BROMIDE-OLODATEROL (STIOLTO RESPIMAT) 2.5-2.5 MCG/ACT AERS    Inhale 2 puffs into the lungs daily.  Modified Medications   Modified Medication Previous Medication   HYDROCODONE-ACETAMINOPHEN (NORCO) 7.5-325 MG TABLET HYDROcodone-acetaminophen (NORCO) 7.5-325 MG tablet      Take 1 tablet by mouth every 6 (six) hours as needed for moderate pain or severe pain.    Take 1 tablet by mouth every 6 (six) hours as needed for moderate pain or severe pain.  Discontinued Medications   No medications on file   ----------------------------------------------------------------------------------------------------------------------  Follow-up: Return in about 2 months (around 03/01/2022) for procedure, evaluation.    Molli Barrows, MD

## 2022-01-01 LAB — TOXASSURE SELECT 13 (MW), URINE

## 2022-01-19 DIAGNOSIS — J441 Chronic obstructive pulmonary disease with (acute) exacerbation: Secondary | ICD-10-CM | POA: Diagnosis not present

## 2022-01-19 DIAGNOSIS — I10 Essential (primary) hypertension: Secondary | ICD-10-CM | POA: Diagnosis not present

## 2022-01-19 DIAGNOSIS — E1169 Type 2 diabetes mellitus with other specified complication: Secondary | ICD-10-CM | POA: Diagnosis not present

## 2022-01-19 DIAGNOSIS — E785 Hyperlipidemia, unspecified: Secondary | ICD-10-CM | POA: Diagnosis not present

## 2022-01-27 ENCOUNTER — Telehealth: Payer: Self-pay | Admitting: Pulmonary Disease

## 2022-01-28 NOTE — Telephone Encounter (Signed)
Called and spoke with patient's daughter regarding CPAP machine.  She said her mom went to her PCP office stating that she was not sleeping well and they mentioned her CPAP machine.  She had not worn it since her daughter had been living with her and that had been 3 years, she did not even know she had a CPAP machine.  She said her mom had taken it all apart and her sister put it back together and were trying to to get the settings for the machine.  She did not know the name of the DME company.  I looked in the referrals and it looked like she used [facility] back in 2014 and the settings were auto 5-15 cm H20.  I provided her with the phone number for the DME company and advised that she call them and let them know that she was going to start wearing it again and needed it set up.  She verbalized understanding.  Nothing further needed.

## 2022-02-03 DIAGNOSIS — E1169 Type 2 diabetes mellitus with other specified complication: Secondary | ICD-10-CM | POA: Diagnosis not present

## 2022-02-03 DIAGNOSIS — I10 Essential (primary) hypertension: Secondary | ICD-10-CM | POA: Diagnosis not present

## 2022-02-03 DIAGNOSIS — E785 Hyperlipidemia, unspecified: Secondary | ICD-10-CM | POA: Diagnosis not present

## 2022-02-03 DIAGNOSIS — J441 Chronic obstructive pulmonary disease with (acute) exacerbation: Secondary | ICD-10-CM | POA: Diagnosis not present

## 2022-02-11 DIAGNOSIS — E785 Hyperlipidemia, unspecified: Secondary | ICD-10-CM | POA: Diagnosis not present

## 2022-02-11 DIAGNOSIS — G4733 Obstructive sleep apnea (adult) (pediatric): Secondary | ICD-10-CM | POA: Diagnosis not present

## 2022-02-11 DIAGNOSIS — E1169 Type 2 diabetes mellitus with other specified complication: Secondary | ICD-10-CM | POA: Diagnosis not present

## 2022-02-11 DIAGNOSIS — R300 Dysuria: Secondary | ICD-10-CM | POA: Diagnosis not present

## 2022-02-11 DIAGNOSIS — F324 Major depressive disorder, single episode, in partial remission: Secondary | ICD-10-CM | POA: Diagnosis not present

## 2022-02-11 DIAGNOSIS — G479 Sleep disorder, unspecified: Secondary | ICD-10-CM | POA: Diagnosis not present

## 2022-02-11 DIAGNOSIS — I10 Essential (primary) hypertension: Secondary | ICD-10-CM | POA: Diagnosis not present

## 2022-02-11 DIAGNOSIS — F419 Anxiety disorder, unspecified: Secondary | ICD-10-CM | POA: Diagnosis not present

## 2022-02-11 DIAGNOSIS — J449 Chronic obstructive pulmonary disease, unspecified: Secondary | ICD-10-CM | POA: Diagnosis not present

## 2022-02-19 ENCOUNTER — Inpatient Hospital Stay: Admission: RE | Admit: 2022-02-19 | Payer: Medicare HMO | Source: Ambulatory Visit

## 2022-03-05 DIAGNOSIS — I10 Essential (primary) hypertension: Secondary | ICD-10-CM | POA: Diagnosis not present

## 2022-03-05 DIAGNOSIS — E785 Hyperlipidemia, unspecified: Secondary | ICD-10-CM | POA: Diagnosis not present

## 2022-03-05 DIAGNOSIS — E1169 Type 2 diabetes mellitus with other specified complication: Secondary | ICD-10-CM | POA: Diagnosis not present

## 2022-03-05 DIAGNOSIS — J449 Chronic obstructive pulmonary disease, unspecified: Secondary | ICD-10-CM | POA: Diagnosis not present

## 2022-03-17 ENCOUNTER — Ambulatory Visit: Payer: Medicare HMO | Admitting: Anesthesiology

## 2022-03-17 ENCOUNTER — Telehealth: Payer: Self-pay

## 2022-03-17 NOTE — Telephone Encounter (Signed)
Sent mychart message re elevators

## 2022-03-25 DIAGNOSIS — F324 Major depressive disorder, single episode, in partial remission: Secondary | ICD-10-CM | POA: Diagnosis not present

## 2022-04-05 NOTE — Progress Notes (Signed)
Cardiology Office Note Date:  04/05/2022  Patient ID:  Kelsey Diaz, Kelsey Diaz 11/23/1945, MRN [id number] PCP:  Laurann Montana, MD  Cardiologist:  Dr. Antoine Poche Electrophysiologist: Dr. Lalla Brothers    Chief Complaint:  annual visit  History of Present Illness: Kelsey Diaz is a 77 y.o. female with history of RA, fibromyalgia (chronic opioid use), non-obstructive CAD, OSA (untreated), HTN, VT  She was admitted to [facility] 12/11/20 with recurrent near syncope EMS was called, found in VT that broke with amio bolus > gtt and lidocaine as well with recurrent episodes. EP consulted, suspect outflow tract VT, amio/lidocaine eventually stopped and planned for EPS Study was unable to induce VT, she was discharged 12/17/20 on amiodarone and metoprolol. No indication for ICD with structurally normal heat/and automatic focus of VT.  Phone notes with pt reports of some lightheadedness and low HRs, in RN follow up, feeling better with HRs 50's, BPs wnl. Post hospital follow up apt was moved up  I saw her 01/06/21 She is doing fairly well No near syncope or syncope, once she has a slight awareness of something in her chest though was fleeting and only a vague "something", none again No CP, no overt palpitations SHe has some degree of chronic DOE that is unchanged No rest SOB She has a rash R groin she wonders if it could be a reaction the the medicines, she has not noted rash anywhere else. BP has been OK, sometimes her HR can dip to the 49 range, though usually is 50's60's range Rash not felt to be medication related and advised to see her PMD. No change in medications made.  She saw Dr. Lambert jan 2023, reported occ palpitations,  her metoprolol dose reduced with daytime fatigue. Did not recommend re-challenging amiodarone in the future given neuropathy with it  Admitted 04/04/21, urosepsis, EColi, subsequent blood cultures showing diffuse bacteremia as well  Discharged 04/07/21  TODAY She is  accompanied by her son in law is doing fair. Struggles with significant pain, back, arthritis, fibromyalgia, really keeps her down. She infrequently has a brief awareness of her heart, this is not new, dates back to before/at about the time of her cardiac w/u.  No palpitations with it, no exertional, no pattern or trigger.   No symptoms of the VT, that was very uncomfortable and alarming. She has not had any near syncope or syncope No rest SOB Reports chronic mild DOE unchanged  AAD Hx Amiodarone started Oct 2022 stopped Dec 2022 with c/o dizzy, shaking, forgetfulness  Past Medical History:  Diagnosis Date   Allergic rhinitis    Anxiety    Asthma    Bilateral hip pain 11/28/2018   Colitis    COPD (chronic obstructive pulmonary disease) (HCC)    Fibromyalgia 05/16/2019   GERD (gastroesophageal reflux disease)    With esophageal strictures   Hypertension    Hyperthyroidism    OSA (obstructive sleep apnea) 02/09/2012   No mask.  Did not tolerate   Osteoarthritis    Rheumatoid arthritis (HCC) 10/03/2018    Past Surgical History:  Procedure Laterality Date   BREAST BIOPSY     x2   CATARACT EXTRACTION W/PHACO Left 09/27/2018   Procedure: CATARACT EXTRACTION PHACO AND INTRAOCULAR LENS PLACEMENT (IOC) LEFT;  Surgeon: Galen Manila, MD;  Location: [facility];  Service: Ophthalmology;  Laterality: Left;   CATARACT EXTRACTION W/PHACO Right 11/29/2018   Procedure: CATARACT EXTRACTION PHACO AND INTRAOCULAR LENS PLACEMENT (IOC) RIGHT;  Surgeon: Galen Manila, MD;  Location:  [facility];  Service: Ophthalmology;  Laterality: Right;  1:22 20.1% 16.64   CHOLECYSTECTOMY     2005   KNEE SURGERY Right 2013   two torn ligaments repaired.    LEFT HEART CATH AND CORONARY ANGIOGRAPHY N/A 06/08/2017   Procedure: LEFT HEART CATH AND CORONARY ANGIOGRAPHY;  Surgeon: Lennette Bihari, MD;  Location: [facility];  Service: Cardiovascular;  Laterality: N/A;   LEFT HEART CATH  AND CORONARY ANGIOGRAPHY N/A 12/12/2020   Procedure: LEFT HEART CATH AND CORONARY ANGIOGRAPHY;  Surgeon: Runell Gess, MD;  Location: [facility];  Service: Cardiovascular;  Laterality: N/A;   NASAL SINUS SURGERY     V TACH ABLATION N/A 12/16/2020   Procedure: V TACH ABLATION;  Surgeon: Lanier Prude, MD;  Location: [facility];  Service: Cardiovascular;  Laterality: N/A;   VAGINAL HYSTERECTOMY      Current Outpatient Medications  Medication Sig Dispense Refill   ACCU-CHEK GUIDE test strip      Accu-Chek Softclix Lancets lancets      albuterol (VENTOLIN HFA) 108 (90 Base) MCG/ACT inhaler Inhale 2 puffs into the lungs every 6 (six) hours as needed for shortness of breath.      aspirin 81 MG chewable tablet Chew 81 mg by mouth daily.     Blood Glucose Monitoring Suppl (ACCU-CHEK GUIDE ME) w/Device KIT      Calcium Carbonate-Vitamin D 600-200 MG-UNIT TABS Take 1 tablet by mouth daily.      Clobetasol Prop Emollient Base 0.05 % emollient cream Apply 1 application topically 2 (two) times daily.     DULoxetine (CYMBALTA) 60 MG capsule Take 60 mg by mouth 2 (two) times daily.     etanercept (ENBREL) 50 MG/ML injection Inject 50 mg into the skin once a week. Friday     fluocinonide (LIDEX) 0.05 % external solution Apply 1 application topically 2 (two) times daily as needed (skin irritation).      fluticasone (FLONASE) 50 MCG/ACT nasal spray 2 sprays daily as needed for allergies.     folic acid (FOLVITE) 1 MG tablet Take 1 tablet by mouth daily.     meloxicam (MOBIC) 15 MG tablet Take 15 mg by mouth daily.     metoprolol succinate (TOPROL XL) 25 MG 24 hr tablet Take 1 tablet (25 mg total) by mouth 2 (two) times daily. 180 tablet 3   Multiple Vitamin (MULTI-VITAMIN PO) Take 1 tablet by mouth daily.      nystatin (MYCOSTATIN/NYSTOP) powder 1 application daily as needed (skin irritation).     Omega-3 Fatty Acids (FISH OIL PO) Take 1,000 mg by mouth daily.      omeprazole  (PRILOSEC) 40 MG capsule Take 40 mg by mouth daily.  1   OSPHENA 60 MG TABS Take 1 tablet by mouth daily.     rosuvastatin (CRESTOR) 20 MG tablet Take 20 mg by mouth once a week.     Tiotropium Bromide-Olodaterol (STIOLTO RESPIMAT) 2.5-2.5 MCG/ACT AERS Inhale 2 puffs into the lungs daily. 4 g 11   No current facility-administered medications for this visit.    Allergies:   Morphine, Advair diskus [fluticasone-salmeterol], Ambien [zolpidem tartrate], Amlodipine besylate, Azithromycin, Bextra [valdecoxib], Bupropion, Captopril, Irbesartan, Latex, Lisinopril, Pacerone [amiodarone], Pulmicort [budesonide], Savella [milnacipran hcl], Spiriva [tiotropium bromide monohydrate], Suvorexant, and Tiotropium bromide monohydrate   Social History:  The patient  reports that she quit smoking about 23 years ago. Her smoking use included cigarettes. She has a 80.00 pack-year smoking history.  She has never used smokeless tobacco. She reports that she does not currently use alcohol. She reports that she does not use drugs.   Family History:  The patient's family history includes Allergies in her mother; Cancer in her father; Diabetes in her mother; Emphysema in her father; Heart attack (age of onset: 37) in her father; Heart attack (age of onset: 1) in her brother; Heart disease in her father; Hypertension in her mother.  ROS:  Please see the history of present illness.    All other systems are reviewed and otherwise negative.   PHYSICAL EXAM:  VS:  There were no vitals taken for this visit. BMI: There is no height or weight on file to calculate BMI. Well nourished, well developed, in no acute distress HEENT: normocephalic, atraumatic Neck: no JVD, carotid bruits or masses Cardiac:  RRR; no significant murmurs, no rubs, or gallops Lungs:  CTA b/l, no wheezing, rhonchi or rales Abd: soft, nontender MS: no deformity or atrophy Ext: no edema Skin: warm and dry, no rash Neuro:  No gross deficits  appreciated Psych: euthymic mood, full affect    EKG:  done today and reviewed by myself SR/sinus arrhythmia, RBBB, LAD, 71bpm, QRS  12/16/20: EPS CONCLUSIONS: 1.  No inducible ventricular tachycardia with isoproterenol infusion  2. No early apparent complications. 3.  Continue metoprolol and amiodarone   12/12/20; LHC IMPRESSION: Ms. Oakland has essentially normal coronary arteries and normal filling pressures.  Her ventricular tachycardia is not ischemically mediated.  It may be related to electrolyte abnormalities.  She was hypomagnesemic and hypokalemic and has been repleted.  Further evaluation per the EP service.  Dr. Steffanie Dunn has been notified.  The sheath was removed and a TR band was placed on the right wrist to achieve patent hemostasis.  The patient left lab in stable condition   12/12/20; TTE IMPRESSIONS   1. Left ventricular ejection fraction, by estimation, is 60 to 65%. The  left ventricle has normal function. The left ventricle has no regional  wall motion abnormalities. Left ventricular diastolic parameters are  consistent with Grade I diastolic  dysfunction (impaired relaxation).   2. Right ventricular systolic function is normal. The right ventricular  size is normal.   3. Left atrial size was mild to moderately dilated.   4. The mitral valve is normal in structure. No evidence of mitral valve  regurgitation. No evidence of mitral stenosis.   5. The aortic valve is tricuspid. There is mild calcification of the  aortic valve. Aortic valve regurgitation is not visualized. Mild to  moderate aortic valve sclerosis/calcification is present, without any  evidence of aortic stenosis.   6. The inferior vena cava is normal in size with greater than 50%  respiratory variability, suggesting right atrial pressure of 3 mmHg.    Recent Labs: No results found for requested labs within last 365 days.  No results found for requested labs within last 365 days.    CrCl cannot be calculated (Patient's most recent lab result is older than the maximum 21 days allowed.).   Wt Readings from Last 3 Encounters:  12/30/21 194 lb (88 kg)  07/11/21 191 lb (86.6 kg)  04/21/21 194 lb 6.4 oz (88.2 kg)     Other studies reviewed: Additional studies/records reviewed today include: summarized above  ASSESSMENT AND PLAN:  VT Suspect outflow tract Unable to induce at time of EPS No current indication for ICD given automatic mechanism to her outflow tract VT in a structurally normal  heart failed Amiodarone w/neuropathy/neuro symptoms   No symptoms of arrhythmia continue metoprolol     Disposition: she is comfortable with annual visits, encouraged her to reach out sooner if needed  Current medicines are reviewed at length with the patient today.  The patient did not have any concerns regarding medicines.  Norma Fredrickson, PA-C 04/05/2022 12:09 PM     [facility] [address] [place] [address] [phone] (office)  [phone] (fax)

## 2022-04-06 ENCOUNTER — Ambulatory Visit (HOSPITAL_BASED_OUTPATIENT_CLINIC_OR_DEPARTMENT_OTHER): Payer: Medicare HMO | Admitting: Anesthesiology

## 2022-04-06 ENCOUNTER — Encounter: Payer: Self-pay | Admitting: Anesthesiology

## 2022-04-06 ENCOUNTER — Ambulatory Visit
Admission: RE | Admit: 2022-04-06 | Discharge: 2022-04-06 | Disposition: A | Discharge status: Home / Self Care | Payer: Medicare HMO | Source: Ambulatory Visit | Attending: Anesthesiology | Admitting: Anesthesiology

## 2022-04-06 ENCOUNTER — Other Ambulatory Visit: Payer: Self-pay | Admitting: Anesthesiology

## 2022-04-06 VITALS — BP 142/83 | HR 69 | Temp 97.0°F | Resp 16 | Ht 67.0 in | Wt 200.0 lb

## 2022-04-06 DIAGNOSIS — M25551 Pain in right hip: Secondary | ICD-10-CM | POA: Insufficient documentation

## 2022-04-06 DIAGNOSIS — M5431 Sciatica, right side: Secondary | ICD-10-CM | POA: Insufficient documentation

## 2022-04-06 DIAGNOSIS — F1190 Opioid use, unspecified, uncomplicated: Secondary | ICD-10-CM | POA: Diagnosis not present

## 2022-04-06 DIAGNOSIS — M25552 Pain in left hip: Secondary | ICD-10-CM | POA: Diagnosis not present

## 2022-04-06 DIAGNOSIS — M5432 Sciatica, left side: Secondary | ICD-10-CM | POA: Insufficient documentation

## 2022-04-06 DIAGNOSIS — J449 Chronic obstructive pulmonary disease, unspecified: Secondary | ICD-10-CM | POA: Diagnosis not present

## 2022-04-06 DIAGNOSIS — E1169 Type 2 diabetes mellitus with other specified complication: Secondary | ICD-10-CM | POA: Diagnosis not present

## 2022-04-06 DIAGNOSIS — M0579 Rheumatoid arthritis with rheumatoid factor of multiple sites without organ or systems involvement: Secondary | ICD-10-CM | POA: Diagnosis not present

## 2022-04-06 DIAGNOSIS — G894 Chronic pain syndrome: Secondary | ICD-10-CM

## 2022-04-06 DIAGNOSIS — M4716 Other spondylosis with myelopathy, lumbar region: Secondary | ICD-10-CM

## 2022-04-06 DIAGNOSIS — M5136 Other intervertebral disc degeneration, lumbar region: Secondary | ICD-10-CM

## 2022-04-06 DIAGNOSIS — I10 Essential (primary) hypertension: Secondary | ICD-10-CM | POA: Diagnosis not present

## 2022-04-06 DIAGNOSIS — E785 Hyperlipidemia, unspecified: Secondary | ICD-10-CM | POA: Diagnosis not present

## 2022-04-06 MED ORDER — SODIUM CHLORIDE 0.9% FLUSH
10.0000 mL | Freq: Once | INTRAVENOUS | Status: AC
  Administered 2022-04-06: 10 mL

## 2022-04-06 MED ORDER — LIDOCAINE HCL (PF) 1 % IJ SOLN
INTRAMUSCULAR | Status: AC
  Filled 2022-04-06: qty 10

## 2022-04-06 MED ORDER — SODIUM CHLORIDE (PF) 0.9 % IJ SOLN
INTRAMUSCULAR | Status: AC
  Filled 2022-04-06: qty 10

## 2022-04-06 MED ORDER — LIDOCAINE HCL (PF) 1 % IJ SOLN
5.0000 mL | Freq: Once | INTRAMUSCULAR | Status: AC
  Administered 2022-04-06: 5 mL via SUBCUTANEOUS

## 2022-04-06 MED ORDER — TRIAMCINOLONE ACETONIDE 40 MG/ML IJ SUSP
INTRAMUSCULAR | Status: AC
  Filled 2022-04-06: qty 1

## 2022-04-06 MED ORDER — TRIAMCINOLONE ACETONIDE 40 MG/ML IJ SUSP
40.0000 mg | Freq: Once | INTRAMUSCULAR | Status: AC
  Administered 2022-04-06: 40 mg

## 2022-04-06 MED ORDER — ROPIVACAINE HCL 2 MG/ML IJ SOLN
INTRAMUSCULAR | Status: AC
  Filled 2022-04-06: qty 20

## 2022-04-06 MED ORDER — IOHEXOL 180 MG/ML  SOLN
INTRAMUSCULAR | Status: AC
  Filled 2022-04-06: qty 20

## 2022-04-06 MED ORDER — ROPIVACAINE HCL 2 MG/ML IJ SOLN
10.0000 mL | Freq: Once | INTRAMUSCULAR | Status: AC
  Administered 2022-04-06: 1 mL via EPIDURAL

## 2022-04-06 MED ORDER — HYDROCODONE-ACETAMINOPHEN 7.5-325 MG PO TABS: 1.0000 | ORAL_TABLET | Freq: Four times a day (QID) | ORAL | 0 refills | Status: AC | PRN

## 2022-04-06 MED ORDER — IOHEXOL 180 MG/ML  SOLN
10.0000 mL | Freq: Once | INTRAMUSCULAR | Status: AC | PRN
  Administered 2022-04-06: 10 mL via EPIDURAL

## 2022-04-06 MED ORDER — HYDROCODONE-ACETAMINOPHEN 7.5-325 MG PO TABS: 1.0000 | ORAL_TABLET | Freq: Four times a day (QID) | ORAL | 0 refills | Status: DC | PRN

## 2022-04-06 NOTE — Patient Instructions (Signed)
Pain Management Discharge Instructions  General Discharge Instructions :  If you need to reach your doctor call: Monday-Friday 8:00 am - 4:00 pm at [phone] or toll free [phone].  After clinic hours [phone] to have operator reach doctor.  Bring all of your medication bottles to all your appointments in the pain clinic.  To cancel or reschedule your appointment with Pain Management please remember to call 24 hours in advance to avoid a fee.  Refer to the educational materials which you have been given on: General Risks, I had my Procedure. Discharge Instructions, Post Sedation.  Post Procedure Instructions:  The drugs you were given will stay in your system until tomorrow, so for the next 24 hours you should not drive, make any legal decisions or drink any alcoholic beverages.  You may eat anything you prefer, but it is better to start with liquids then soups and crackers, and gradually work up to solid foods.  Please notify your doctor immediately if you have any unusual bleeding, trouble breathing or pain that is not related to your normal pain.  Depending on the type of procedure that was done, some parts of your body may feel week and/or numb.  This usually clears up by tonight or the next day.  Walk with the use of an assistive device or accompanied by an adult for the 24 hours.  You may use ice on the affected area for the first 24 hours.  Put ice in a Ziploc bag and cover with a towel and place against area 15 minutes on 15 minutes off.  You may switch to heat after 24 hours.Epidural Steroid Injection Patient Information  Description: The epidural space surrounds the nerves as they exit the spinal cord.  In some patients, the nerves can be compressed and inflamed by a bulging disc or a tight spinal canal (spinal stenosis).  By injecting steroids into the epidural space, we can bring irritated nerves into direct contact with a potentially helpful medication.  These  steroids act directly on the irritated nerves and can reduce swelling and inflammation which often leads to decreased pain.  Epidural steroids may be injected anywhere along the spine and from the neck to the low back depending upon the location of your pain.   After numbing the skin with local anesthetic (like Novocaine), a small needle is passed into the epidural space slowly.  You may experience a sensation of pressure while this is being done.  The entire block usually last less than 10 minutes.  Conditions which may be treated by epidural steroids:  Low back and leg pain Neck and arm pain Spinal stenosis Post-laminectomy syndrome Herpes zoster (shingles) pain Pain from compression fractures  Preparation for the injection:  Do not eat any solid food or dairy products within 8 hours of your appointment.  You may drink clear liquids up to 3 hours before appointment.  Clear liquids include water, black coffee, juice or soda.  No milk or cream please. You may take your regular medication, including pain medications, with a sip of water before your appointment  Diabetics should hold regular insulin (if taken separately) and take 1/2 normal NPH dos the morning of the procedure.  Carry some sugar containing items with you to your appointment. A driver must accompany you and be prepared to drive you home after your procedure.  Bring all your current medications with your. An IV may be inserted and sedation may be given at the discretion of the physician.  A blood pressure cuff, EKG and other monitors will often be applied during the procedure.  Some patients may need to have extra oxygen administered for a short period. You will be asked to provide medical information, including your allergies, prior to the procedure.  We must know immediately if you are taking blood thinners (like Coumadin/Warfarin)  Or if you are allergic to IV iodine contrast (dye). We must know if you could possible be  pregnant.  Possible side-effects: Bleeding from needle site Infection (rare, may require surgery) Nerve injury (rare) Numbness & tingling (temporary) Difficulty urinating (rare, temporary) Spinal headache ( a headache worse with upright posture) Light -headedness (temporary) Pain at injection site (several days) Decreased blood pressure (temporary) Weakness in arm/leg (temporary) Pressure sensation in back/neck (temporary)  Call if you experience: Fever/chills associated with headache or increased back/neck pain. Headache worsened by an upright position. New onset weakness or numbness of an extremity below the injection site Hives or difficulty breathing (go to the emergency room) Inflammation or drainage at the infection site Severe back/neck pain Any new symptoms which are concerning to you  Please note:  Although the local anesthetic injected can often make your back or neck feel good for several hours after the injection, the pain will likely return.  It takes 3-7 days for steroids to work in the epidural space.  You may not notice any pain relief for at least that one week.  If effective, we will often do a series of three injections spaced 3-6 weeks apart to maximally decrease your pain.  After the initial series, we generally will wait several months before considering a repeat injection of the same type.  If you have any questions, please call [phone] [facility]

## 2022-04-06 NOTE — Progress Notes (Unsigned)
Nursing Pain Medication Assessment:  Safety precautions to be maintained throughout the outpatient stay will include: orient to surroundings, keep bed in low position, maintain call bell within reach at all times, provide assistance with transfer out of bed and ambulation.  Medication Inspection Compliance: Pill count conducted under aseptic conditions, in front of the patient. Neither the pills nor the bottle was removed from the patient's sight at any time. Once count was completed pills were immediately returned to the patient in their original bottle.  Medication: Hydrocodone/APAP Pill/Patch Count:  43 of 120 pills remain Pill/Patch Appearance: Markings consistent with prescribed medication Bottle Appearance: Standard pharmacy container. Clearly labeled. Filled Date: 01 / 23 / 2024 Last Medication intake:  Today

## 2022-04-07 ENCOUNTER — Encounter: Payer: Self-pay | Admitting: Physician Assistant

## 2022-04-07 ENCOUNTER — Telehealth: Payer: Self-pay

## 2022-04-07 ENCOUNTER — Ambulatory Visit: Payer: Medicare HMO | Attending: Physician Assistant | Admitting: Physician Assistant

## 2022-04-07 VITALS — BP 110/82 | HR 71 | Ht 67.0 in | Wt 197.0 lb

## 2022-04-07 DIAGNOSIS — I472 Ventricular tachycardia, unspecified: Secondary | ICD-10-CM

## 2022-04-07 NOTE — Telephone Encounter (Signed)
Post procedure follow up.  Patients daughter states she is asleep but was doing well last night.

## 2022-04-07 NOTE — Patient Instructions (Signed)
Medication Instructions:   Your physician recommends that you continue on your current medications as directed. Please refer to the Current Medication list given to you today.   *If you need a refill on your cardiac medications before your next appointment, please call your pharmacy*   Lab Work:  [place]   If you have labs (blood work) drawn today and your tests are completely normal, you will receive your results only by: [place] (if you have MyChart) OR A paper copy in the mail If you have any lab test that is abnormal or we need to change your treatment, we will call you to review the results.   Testing/Procedures: NONE ORDERED  TODAY     Follow-Up: At [facility], you and your health needs are our priority.  As part of our continuing mission to provide you with exceptional heart care, we have created designated Provider Care Teams.  These Care Teams include your primary Cardiologist (physician) and Advanced Practice Providers (APPs -  Physician Assistants and Nurse Practitioners) who all work together to provide you with the care you need, when you need it.  We recommend signing up for the patient portal called "MyChart".  Sign up information is provided on this After Visit Summary.  MyChart is used to connect with patients for Virtual Visits (Telemedicine).  Patients are able to view lab/test results, encounter notes, upcoming appointments, etc.  Non-urgent messages can be sent to your provider as well.   To learn more about what you can do with MyChart, go to [email or web address].    Your next appointment:   1 year(s)  Provider:   You may see Vickie Epley, MD or one of the following Advanced Practice Providers on your designated Care Team:   Tommye Standard, [place] Beryle Beams" [place], [place] Mamie Levers, NP   Other Instructions

## 2022-04-08 NOTE — Progress Notes (Signed)
Subjective:  Patient ID: Kelsey Diaz, female    DOB: 09/03/1945  Age: 77 y.o. MRN: [id number]  CC: Back Pain   Procedure: L5-S1 lumbar epidural steroid and fluoroscopic guidance with no sedation  HPI Kelsey Diaz presents for reevaluation.  She was last seen few months ago and is having recurrence of the low back pain with spasming associated.  This pain radiates down both legs and into her hips.  In the past she has had epidural steroids that have helped alleviate a large component of this pain.  She reports that she generally gets about 80% improvement in her low back pain and nearly 100% improvement in the hip and sciatica component lasting about 2 to 3 months before she has a gradual recurrence of pain.  Her last injections were in 2020 and 2022 February July and December.  She has done quite well since that time but has had recurrence of the same pain and presents requesting a repeat epidural today.  Outpatient Medications Prior to Visit  Medication Sig Dispense Refill   ACCU-CHEK GUIDE test strip      Accu-Chek Softclix Lancets lancets      albuterol (VENTOLIN HFA) 108 (90 Base) MCG/ACT inhaler Inhale 2 puffs into the lungs every 6 (six) hours as needed for shortness of breath.      aspirin 81 MG chewable tablet Chew 81 mg by mouth daily.     Blood Glucose Monitoring Suppl (ACCU-CHEK GUIDE ME) w/Device KIT      Calcium Carbonate-Vitamin D 600-200 MG-UNIT TABS Take 1 tablet by mouth daily.      Clobetasol Prop Emollient Base 0.05 % emollient cream Apply 1 application topically 2 (two) times daily.     DULoxetine (CYMBALTA) 60 MG capsule Take 60 mg by mouth 2 (two) times daily.     etanercept (ENBREL) 50 MG/ML injection Inject 50 mg into the skin once a week. Friday     HYDROcodone-acetaminophen (NORCO) 7.5-325 MG tablet Take 1 tablet by mouth every 6 (six) hours as needed.     fluocinonide (LIDEX) 0.05 % external solution Apply 1 application topically 2 (two) times daily as  needed (skin irritation).      fluticasone (FLONASE) 50 MCG/ACT nasal spray 2 sprays daily as needed for allergies.     folic acid (FOLVITE) 1 MG tablet Take 1 tablet by mouth daily.     meloxicam (MOBIC) 15 MG tablet Take 15 mg by mouth daily.     metoprolol succinate (TOPROL XL) 25 MG 24 hr tablet Take 1 tablet (25 mg total) by mouth 2 (two) times daily. 180 tablet 3   Multiple Vitamin (MULTI-VITAMIN PO) Take 1 tablet by mouth daily.      nystatin (MYCOSTATIN/NYSTOP) powder 1 application daily as needed (skin irritation).     Omega-3 Fatty Acids (FISH OIL PO) Take 1,000 mg by mouth daily.      omeprazole (PRILOSEC) 40 MG capsule Take 40 mg by mouth daily.  1   OSPHENA 60 MG TABS Take 1 tablet by mouth daily.     rosuvastatin (CRESTOR) 20 MG tablet Take 20 mg by mouth once a week.     Tiotropium Bromide-Olodaterol (STIOLTO RESPIMAT) 2.5-2.5 MCG/ACT AERS Inhale 2 puffs into the lungs daily. 4 g 11   No facility-administered medications prior to visit.    Review of Systems CNS: No confusion or sedation Cardiac: No angina or palpitations GI: No abdominal pain or constipation Constitutional: No nausea vomiting fevers or chills  Objective:  BP (!) 142/83   Pulse 69   Temp (!) 97 F (36.1 C)   Resp 16   Ht 5' 7"$  (1.702 m)   Wt 200 lb (90.7 kg)   SpO2 95%   BMI 31.32 kg/m    BP Readings from Last 3 Encounters:  04/07/22 110/82  04/06/22 (!) 142/83  12/30/21 (!) 126/104     Wt Readings from Last 3 Encounters:  04/07/22 197 lb (89.4 kg)  04/06/22 200 lb (90.7 kg)  12/30/21 194 lb (88 kg)     Physical Exam Pt is alert and oriented PERRL EOMI HEART IS RRR no murmur or rub LCTA no wheezing or rales MUSCULOSKELETAL reveals some paraspinous muscle tenderness but no overt trigger points are noted.  Lower extremity muscle tone and bulk is at baseline and she is ambulating with a slightly antalgic gait.  She has a positive straight leg raise on the left side negative on the  right though somewhat equivocal.  Labs  Lab Results  Component Value Date   HGBA1C 6.1 (H) 12/11/2020   Lab Results  Component Value Date   LDLCALC 39 12/11/2020   CREATININE 0.61 04/05/2021    -------------------------------------------------------------------------------------------------------------------- Lab Results  Component Value Date   WBC 8.6 04/04/2021   HGB 13.0 04/04/2021   HCT 38.2 04/04/2021   PLT 149 (L) 04/04/2021   GLUCOSE 116 (H) 04/05/2021   CHOL 113 12/11/2020   TRIG 127 12/11/2020   HDL 49 12/11/2020   LDLCALC 39 12/11/2020   ALT 14 04/04/2021   AST 16 04/04/2021   NA 138 04/05/2021   K 3.7 04/05/2021   CL 106 04/05/2021   CREATININE 0.61 04/05/2021   BUN 9 04/05/2021   CO2 24 04/05/2021   TSH 3.320 03/04/2021   INR 1.0 04/04/2021   HGBA1C 6.1 (H) 12/11/2020    --------------------------------------------------------------------------------------------------------------------- DG PAIN CLINIC C-ARM 1-60 MIN NO REPORT  Result Date: 04/06/2022 Fluoro was used, but no Radiologist interpretation will be provided. Please refer to "NOTES" tab for provider progress note.    Assessment & Plan:   Dylen was seen today for back pain.  Diagnoses and all orders for this visit:  Bilateral hip pain  DDD (degenerative disc disease), lumbar -     Lumbar Epidural Injection  Bilateral sciatica -     Lumbar Epidural Injection  Lumbar spondylosis with myelopathy -     Lumbar Epidural Injection  Chronic, continuous use of opioids -     ToxASSURE Select 13 (MW), Urine  Chronic pain syndrome -     ToxASSURE Select 13 (MW), Urine  Rheumatoid arthritis involving multiple sites with positive rheumatoid factor ([place])  Other orders -     Discontinue: HYDROcodone-acetaminophen (NORCO) 7.5-325 MG tablet; Take 1 tablet by mouth every 6 (six) hours as needed. -     HYDROcodone-acetaminophen (NORCO) 7.5-325 MG tablet; Take 1 tablet by mouth every 6 (six)  hours as needed for moderate pain or severe pain. -     triamcinolone acetonide (KENALOG-40) injection 40 mg -     sodium chloride flush (NS) 0.9 % injection 10 mL -     ropivacaine (PF) 2 mg/mL (0.2%) (NAROPIN) injection 10 mL -     iohexol (OMNIPAQUE) 180 MG/ML injection 10 mL -     lidocaine (PF) (XYLOCAINE) 1 % injection 5 mL        ----------------------------------------------------------------------------------------------------------------------  Problem List Items Addressed This Visit       Unprioritized   Bilateral hip pain - Primary  Rheumatoid arthritis (HCC)   Relevant Medications   HYDROcodone-acetaminophen (NORCO) 7.5-325 MG tablet (Start on 05/16/2022)   Other Visit Diagnoses     DDD (degenerative disc disease), lumbar       Relevant Medications   HYDROcodone-acetaminophen (NORCO) 7.5-325 MG tablet (Start on 05/16/2022)   triamcinolone acetonide (KENALOG-40) injection 40 mg (Completed)   Bilateral sciatica       Lumbar spondylosis with myelopathy       Relevant Medications   HYDROcodone-acetaminophen (NORCO) 7.5-325 MG tablet (Start on 05/16/2022)   triamcinolone acetonide (KENALOG-40) injection 40 mg (Completed)   Chronic, continuous use of opioids       Relevant Orders   ToxASSURE Select 13 (MW), Urine   Chronic pain syndrome       Relevant Medications   HYDROcodone-acetaminophen (NORCO) 7.5-325 MG tablet (Start on 05/16/2022)   triamcinolone acetonide (KENALOG-40) injection 40 mg (Completed)   ropivacaine (PF) 2 mg/mL (0.2%) (NAROPIN) injection 10 mL (Completed)   lidocaine (PF) (XYLOCAINE) 1 % injection 5 mL (Completed)   Other Relevant Orders   ToxASSURE Select 13 (MW), Urine         ----------------------------------------------------------------------------------------------------------------------  1. DDD (degenerative disc disease), lumbar We will proceed with a repeat epidural injection today per patient request.  We gone over the risk and  benefits of the procedure with her in full detail and all questions were answered.  She has done well with these in the past and generally gets excellent relief of both her low back pain and sciatica symptoms.  In the meantime we will keep her on her current medication management which has worked well for her with chronic opioid therapy where she has failed more conservative management.  Despite this I want her to continue with stretching strengthening exercises and physical therapy exercises with return to clinic scheduled in 2 months. - Lumbar Epidural Injection  2. Bilateral sciatica As above - Lumbar Epidural Injection  3. Lumbar spondylosis with myelopathy As above - Lumbar Epidural Injection  4. Bilateral hip pain As above  5. Chronic, continuous use of opioids I have reviewed the [facility] practitioner database information and it is appropriate for refill. - ToxASSURE Select 13 (MW), Urine  6. Chronic pain syndrome As above - ToxASSURE Select 13 (MW), Urine  7. Rheumatoid arthritis involving multiple sites with positive rheumatoid factor ([place])     ----------------------------------------------------------------------------------------------------------------------  I have discontinued Beckie Busing. Taliaferro's HYDROcodone-acetaminophen. I am also having her start on HYDROcodone-acetaminophen. Additionally, I am having her maintain her Calcium Carbonate-Vitamin D, Multiple Vitamin (MULTI-VITAMIN PO), albuterol, Omega-3 Fatty Acids (FISH OIL PO), fluocinonide, omeprazole, Clobetasol Prop Emollient Base, Osphena, Accu-Chek Guide Me, Accu-Chek Guide, Accu-Chek Softclix Lancets, Enbrel, meloxicam, rosuvastatin, folic acid, aspirin, DULoxetine, fluticasone, nystatin, Stiolto Respimat, and metoprolol succinate. We administered triamcinolone acetonide, sodium chloride flush, ropivacaine (PF) 2 mg/mL (0.2%), iohexol, and lidocaine (PF).   Meds ordered this encounter  Medications    DISCONTD: HYDROcodone-acetaminophen (NORCO) 7.5-325 MG tablet    Sig: Take 1 tablet by mouth every 6 (six) hours as needed.    Dispense:  120 tablet    Refill:  0   HYDROcodone-acetaminophen (NORCO) 7.5-325 MG tablet    Sig: Take 1 tablet by mouth every 6 (six) hours as needed for moderate pain or severe pain.    Dispense:  120 tablet    Refill:  0   triamcinolone acetonide (KENALOG-40) injection 40 mg   sodium chloride flush (NS) 0.9 % injection 10 mL   ropivacaine (PF) 2 mg/mL (  0.2%) (NAROPIN) injection 10 mL   iohexol (OMNIPAQUE) 180 MG/ML injection 10 mL   lidocaine (PF) (XYLOCAINE) 1 % injection 5 mL   Patient's Medications  New Prescriptions   HYDROCODONE-ACETAMINOPHEN (NORCO) 7.5-325 MG TABLET    Take 1 tablet by mouth every 6 (six) hours as needed for moderate pain or severe pain.  Previous Medications   ACCU-CHEK GUIDE TEST STRIP       ACCU-CHEK SOFTCLIX LANCETS LANCETS       ALBUTEROL (VENTOLIN HFA) 108 (90 BASE) MCG/ACT INHALER    Inhale 2 puffs into the lungs every 6 (six) hours as needed for shortness of breath.    ASPIRIN 81 MG CHEWABLE TABLET    Chew 81 mg by mouth daily.   BLOOD GLUCOSE MONITORING SUPPL (ACCU-CHEK GUIDE ME) W/DEVICE KIT       CALCIUM CARBONATE-VITAMIN D 600-200 MG-UNIT TABS    Take 1 tablet by mouth daily.    CLOBETASOL PROP EMOLLIENT BASE 0.05 % EMOLLIENT CREAM    Apply 1 application topically 2 (two) times daily.   DULOXETINE (CYMBALTA) 60 MG CAPSULE    Take 60 mg by mouth 2 (two) times daily.   ETANERCEPT (ENBREL) 50 MG/ML INJECTION    Inject 50 mg into the skin once a week. Friday   FLUOCINONIDE (LIDEX) 0.05 % EXTERNAL SOLUTION    Apply 1 application topically 2 (two) times daily as needed (skin irritation).    FLUTICASONE (FLONASE) 50 MCG/ACT NASAL SPRAY    2 sprays daily as needed for allergies.   FOLIC ACID (FOLVITE) 1 MG TABLET    Take 1 tablet by mouth daily.   MELOXICAM (MOBIC) 15 MG TABLET    Take 15 mg by mouth daily.   METOPROLOL SUCCINATE  (TOPROL XL) 25 MG 24 HR TABLET    Take 1 tablet (25 mg total) by mouth 2 (two) times daily.   MULTIPLE VITAMIN (MULTI-VITAMIN PO)    Take 1 tablet by mouth daily.    NYSTATIN (MYCOSTATIN/NYSTOP) POWDER    1 application daily as needed (skin irritation).   OMEGA-3 FATTY ACIDS (FISH OIL PO)    Take 1,000 mg by mouth daily.    OMEPRAZOLE (PRILOSEC) 40 MG CAPSULE    Take 40 mg by mouth daily.   OSPHENA 60 MG TABS    Take 1 tablet by mouth daily.   ROSUVASTATIN (CRESTOR) 20 MG TABLET    Take 20 mg by mouth once a week.   TIOTROPIUM BROMIDE-OLODATEROL (STIOLTO RESPIMAT) 2.5-2.5 MCG/ACT AERS    Inhale 2 puffs into the lungs daily.  Modified Medications   No medications on file  Discontinued Medications   HYDROCODONE-ACETAMINOPHEN (NORCO) 7.5-325 MG TABLET    Take 1 tablet by mouth every 6 (six) hours as needed.   HYDROCODONE-ACETAMINOPHEN (NORCO) 7.5-325 MG TABLET    Take 1 tablet by mouth every 6 (six) hours as needed.   ----------------------------------------------------------------------------------------------------------------------  Follow-up: Return in about 2 months (around 06/05/2022) for evaluation, med refill.   Procedure: L5-S1 LESI with fluoroscopic guidance and without moderate sedation  NOTE: The risks, benefits, and expectations of the procedure have been discussed and explained to the patient who was understanding and in agreement with suggested treatment plan. No guarantees were made.  DESCRIPTION OF PROCEDURE: Lumbar epidural steroid injection with no IV Versed, EKG, blood pressure, pulse, and pulse oximetry monitoring. The procedure was performed with the patient in the prone position under fluoroscopic guidance.  Sterile prep x3 was initiated and I then injected subcutaneous lidocaine to  the overlying L5-S1 site after its fluoroscopic identifictation.  Using strict aseptic technique, I then advanced an 18-gauge Tuohy epidural needle in the midline using interlaminar approach via  loss-of-resistance to saline technique. There was negative aspiration for heme or  CSF.  I then confirmed position with both AP and Lateral fluoroscan.  2 cc of contrast dye were injected and a  total of 5 mL of Preservative-Free normal saline mixed with 40 mg of Kenalog and 1cc Ropicaine 0.2 percent were injected incrementally via the  epidurally placed needle. The needle was removed. The patient tolerated the injection well and was convalesced and discharged to home in stable condition. Should the patient have any post procedure difficulty they have been instructed on how to contact us for assistance.   Molli Barrows, MD

## 2022-04-09 LAB — TOXASSURE SELECT 13 (MW), URINE

## 2022-04-23 DIAGNOSIS — F324 Major depressive disorder, single episode, in partial remission: Secondary | ICD-10-CM | POA: Diagnosis not present

## 2022-05-03 ENCOUNTER — Observation Stay (HOSPITAL_COMMUNITY)
Admission: EM | Admit: 2022-05-03 | Discharge: 2022-05-04 | Disposition: A | Discharge status: Home / Self Care | Payer: Medicare HMO | Attending: Internal Medicine | Admitting: Internal Medicine

## 2022-05-03 ENCOUNTER — Emergency Department (HOSPITAL_COMMUNITY): Payer: Medicare HMO

## 2022-05-03 ENCOUNTER — Other Ambulatory Visit: Payer: Self-pay

## 2022-05-03 DIAGNOSIS — G4733 Obstructive sleep apnea (adult) (pediatric): Secondary | ICD-10-CM | POA: Diagnosis not present

## 2022-05-03 DIAGNOSIS — I472 Ventricular tachycardia, unspecified: Principal | ICD-10-CM | POA: Insufficient documentation

## 2022-05-03 DIAGNOSIS — I2510 Atherosclerotic heart disease of native coronary artery without angina pectoris: Secondary | ICD-10-CM | POA: Insufficient documentation

## 2022-05-03 DIAGNOSIS — D649 Anemia, unspecified: Secondary | ICD-10-CM | POA: Diagnosis not present

## 2022-05-03 DIAGNOSIS — Z91040 Latex allergy status: Secondary | ICD-10-CM | POA: Insufficient documentation

## 2022-05-03 DIAGNOSIS — J449 Chronic obstructive pulmonary disease, unspecified: Secondary | ICD-10-CM | POA: Diagnosis not present

## 2022-05-03 DIAGNOSIS — I10 Essential (primary) hypertension: Secondary | ICD-10-CM | POA: Insufficient documentation

## 2022-05-03 DIAGNOSIS — Z7982 Long term (current) use of aspirin: Secondary | ICD-10-CM | POA: Insufficient documentation

## 2022-05-03 DIAGNOSIS — R0789 Other chest pain: Secondary | ICD-10-CM | POA: Diagnosis not present

## 2022-05-03 DIAGNOSIS — D696 Thrombocytopenia, unspecified: Secondary | ICD-10-CM

## 2022-05-03 DIAGNOSIS — I499 Cardiac arrhythmia, unspecified: Secondary | ICD-10-CM | POA: Insufficient documentation

## 2022-05-03 DIAGNOSIS — J45909 Unspecified asthma, uncomplicated: Secondary | ICD-10-CM | POA: Insufficient documentation

## 2022-05-03 DIAGNOSIS — Z87891 Personal history of nicotine dependence: Secondary | ICD-10-CM | POA: Diagnosis not present

## 2022-05-03 DIAGNOSIS — R55 Syncope and collapse: Secondary | ICD-10-CM | POA: Diagnosis not present

## 2022-05-03 DIAGNOSIS — J4489 Other specified chronic obstructive pulmonary disease: Secondary | ICD-10-CM | POA: Diagnosis not present

## 2022-05-03 DIAGNOSIS — M0579 Rheumatoid arthritis with rheumatoid factor of multiple sites without organ or systems involvement: Secondary | ICD-10-CM | POA: Diagnosis not present

## 2022-05-03 DIAGNOSIS — R079 Chest pain, unspecified: Secondary | ICD-10-CM | POA: Diagnosis not present

## 2022-05-03 DIAGNOSIS — M069 Rheumatoid arthritis, unspecified: Secondary | ICD-10-CM | POA: Diagnosis present

## 2022-05-03 DIAGNOSIS — R002 Palpitations: Secondary | ICD-10-CM | POA: Diagnosis not present

## 2022-05-03 DIAGNOSIS — Z79899 Other long term (current) drug therapy: Secondary | ICD-10-CM | POA: Insufficient documentation

## 2022-05-03 DIAGNOSIS — I491 Atrial premature depolarization: Secondary | ICD-10-CM | POA: Diagnosis not present

## 2022-05-03 HISTORY — DX: Anemia, unspecified: D64.9

## 2022-05-03 HISTORY — DX: Hypomagnesemia: E83.42

## 2022-05-03 HISTORY — DX: Thrombocytopenia, unspecified: D69.6

## 2022-05-03 LAB — CBC WITH DIFFERENTIAL/PLATELET
Abs Immature Granulocytes: 0.01 10*3/uL (ref 0.00–0.07)
Basophils Absolute: 0 10*3/uL (ref 0.0–0.1)
Basophils Relative: 1 %
Eosinophils Absolute: 0.1 10*3/uL (ref 0.0–0.5)
Eosinophils Relative: 3 %
HCT: 27.1 % — ABNORMAL LOW (ref 36.0–46.0)
Hemoglobin: 8.7 g/dL — ABNORMAL LOW (ref 12.0–15.0)
Immature Granulocytes: 0 %
Lymphocytes Relative: 38 %
Lymphs Abs: 1.6 10*3/uL (ref 0.7–4.0)
MCH: 30.7 pg (ref 26.0–34.0)
MCHC: 32.1 g/dL (ref 30.0–36.0)
MCV: 95.8 fL (ref 80.0–100.0)
Monocytes Absolute: 0.5 10*3/uL (ref 0.1–1.0)
Monocytes Relative: 11 %
Neutro Abs: 2 10*3/uL (ref 1.7–7.7)
Neutrophils Relative %: 47 %
Platelets: 113 10*3/uL — ABNORMAL LOW (ref 150–400)
RBC: 2.83 MIL/uL — ABNORMAL LOW (ref 3.87–5.11)
RDW: 13.5 % (ref 11.5–15.5)
WBC: 4.2 10*3/uL (ref 4.0–10.5)
nRBC: 0 % (ref 0.0–0.2)

## 2022-05-03 LAB — URINALYSIS, ROUTINE W REFLEX MICROSCOPIC
Bilirubin Urine: NEGATIVE
Glucose, UA: NEGATIVE mg/dL
Hgb urine dipstick: NEGATIVE
Ketones, ur: NEGATIVE mg/dL
Leukocytes,Ua: NEGATIVE
Nitrite: NEGATIVE
Protein, ur: NEGATIVE mg/dL
Specific Gravity, Urine: 1.01 (ref 1.005–1.030)
pH: 6 (ref 5.0–8.0)

## 2022-05-03 LAB — MAGNESIUM: Magnesium: 1.6 mg/dL — ABNORMAL LOW (ref 1.7–2.4)

## 2022-05-03 LAB — BASIC METABOLIC PANEL
Anion gap: 11 (ref 5–15)
BUN: 15 mg/dL (ref 8–23)
CO2: 20 mmol/L — ABNORMAL LOW (ref 22–32)
Calcium: 8.4 mg/dL — ABNORMAL LOW (ref 8.9–10.3)
Chloride: 106 mmol/L (ref 98–111)
Creatinine, Ser: 0.8 mg/dL (ref 0.44–1.00)
GFR, Estimated: >60 mL/min (ref >60)
Glucose, Bld: 94 mg/dL (ref 70–99)
Potassium: 4.2 mmol/L (ref 3.5–5.1)
Sodium: 137 mmol/L (ref 135–145)

## 2022-05-03 LAB — POC OCCULT BLOOD, ED: Fecal Occult Bld: NEGATIVE

## 2022-05-03 LAB — TROPONIN I (HIGH SENSITIVITY)
Troponin I (High Sensitivity): 14 ng/L (ref <18)
Troponin I (High Sensitivity): 15 ng/L (ref <18)

## 2022-05-03 MED ORDER — LIDOCAINE BOLUS VIA INFUSION
100.0000 mg | Freq: Once | INTRAVENOUS | Status: DC
  Filled 2022-05-03: qty 100

## 2022-05-03 MED ORDER — UMECLIDINIUM BROMIDE 62.5 MCG/ACT IN AEPB
1.0000 | INHALATION_SPRAY | Freq: Every day | RESPIRATORY_TRACT | Status: DC
  Filled 2022-05-03: qty 7

## 2022-05-03 MED ORDER — MELOXICAM 7.5 MG PO TABS
15.0000 mg | ORAL_TABLET | Freq: Every day | ORAL | Status: DC
  Administered 2022-05-04: 15 mg via ORAL
  Filled 2022-05-03: qty 2

## 2022-05-03 MED ORDER — MAGNESIUM SULFATE 2 GM/50ML IV SOLN
2.0000 g | Freq: Once | INTRAVENOUS | Status: AC
  Administered 2022-05-03: 2 g via INTRAVENOUS
  Filled 2022-05-03: qty 50

## 2022-05-03 MED ORDER — AMIODARONE IV BOLUS ONLY 150 MG/100ML
INTRAVENOUS | Status: AC
  Filled 2022-05-03: qty 100

## 2022-05-03 MED ORDER — ENOXAPARIN SODIUM 40 MG/0.4ML IJ SOSY
40.0000 mg | PREFILLED_SYRINGE | INTRAMUSCULAR | Status: DC
  Administered 2022-05-04: 40 mg via SUBCUTANEOUS
  Filled 2022-05-03: qty 0.4

## 2022-05-03 MED ORDER — AMIODARONE HCL IN DEXTROSE 360-4.14 MG/200ML-% IV SOLN
INTRAVENOUS | Status: AC
  Filled 2022-05-03: qty 200

## 2022-05-03 MED ORDER — PANTOPRAZOLE SODIUM 40 MG IV SOLR
40.0000 mg | Freq: Once | INTRAVENOUS | Status: AC
  Administered 2022-05-03: 40 mg via INTRAVENOUS
  Filled 2022-05-03: qty 10

## 2022-05-03 MED ORDER — ARFORMOTEROL TARTRATE 15 MCG/2ML IN NEBU
15.0000 ug | INHALATION_SOLUTION | Freq: Two times a day (BID) | RESPIRATORY_TRACT | Status: DC
  Administered 2022-05-04: 15 ug via RESPIRATORY_TRACT
  Filled 2022-05-03 (×2): qty 2

## 2022-05-03 MED ORDER — PANTOPRAZOLE SODIUM 40 MG PO TBEC
40.0000 mg | DELAYED_RELEASE_TABLET | Freq: Every day | ORAL | Status: DC
  Administered 2022-05-04: 40 mg via ORAL
  Filled 2022-05-03: qty 1

## 2022-05-03 MED ORDER — METOPROLOL SUCCINATE ER 25 MG PO TB24
12.5000 mg | ORAL_TABLET | Freq: Two times a day (BID) | ORAL | Status: DC
  Administered 2022-05-04 (×2): 12.5 mg via ORAL
  Filled 2022-05-03 (×2): qty 1

## 2022-05-03 MED ORDER — LIDOCAINE IN D5W 4-5 MG/ML-% IV SOLN
1.0000 mg/min | INTRAVENOUS | Status: DC
  Filled 2022-05-03: qty 500

## 2022-05-03 MED ORDER — ASPIRIN 81 MG PO CHEW
81.0000 mg | CHEWABLE_TABLET | Freq: Every day | ORAL | Status: DC
  Administered 2022-05-04: 81 mg via ORAL
  Filled 2022-05-03: qty 1

## 2022-05-03 MED ORDER — OSPEMIFENE 60 MG PO TABS: 1.0000 | ORAL_TABLET | Freq: Every day | ORAL | Status: DC

## 2022-05-03 MED ORDER — DULOXETINE HCL 60 MG PO CPEP
60.0000 mg | ORAL_CAPSULE | Freq: Two times a day (BID) | ORAL | Status: DC
  Administered 2022-05-04 (×2): 60 mg via ORAL
  Filled 2022-05-03: qty 1
  Filled 2022-05-03: qty 2

## 2022-05-03 MED ORDER — IRBESARTAN 150 MG PO TABS
150.0000 mg | ORAL_TABLET | Freq: Every day | ORAL | Status: DC
  Administered 2022-05-04: 150 mg via ORAL
  Filled 2022-05-03: qty 1

## 2022-05-03 MED ORDER — HYDROCODONE-ACETAMINOPHEN 5-325 MG PO TABS
1.5000 | ORAL_TABLET | Freq: Four times a day (QID) | ORAL | Status: DC | PRN
  Administered 2022-05-04 (×2): 1.5 via ORAL
  Filled 2022-05-03 (×2): qty 2

## 2022-05-03 NOTE — Assessment & Plan Note (Addendum)
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 Had [place] with essentially normal coronaries. TTE with preserved LVEF of 60-65% and grade 1 diastolic dysfunction. Discharged with amiodarone and metoprolol but amiodarone later discontinued due to neuro symptoms.  -Now on metoprolol succinate 12.5mg  BID at home. Split dose reportedly due to BP issues per family -Mg was slightly low and has been replenished with IV Mg. Continue to keep Mg >2 and K >4 - cardiology Dr. Humphrey Rolls has evaluated and recommends monitoring and can start Lidocaine bolus/infusion if she was to developed sustained VT overnight.  -Currently in NSR and will keep on continuous telemetry. Admit to stepdown unit -obtain echocardiogram in the morning -also check TSH level

## 2022-05-03 NOTE — Assessment & Plan Note (Addendum)
-  Hgb of 8.7 with prior normal at 13 a year ago -FOBT negative but no stool in stool vault. Denies any melena. She is on daily NSAIDS. No alcohol use.  -last colonoscopy in 2012 essentially normal other than internal hemorrhoids  -check iron panel, ferritin, vitamin V77 and folic -Repeat hemoglobin in the morning -If anemia panel negative and hemoglobin remained stable, recommend follow-up with GI outpatient

## 2022-05-03 NOTE — Assessment & Plan Note (Signed)
-  mildly low at 1.6 -administered IV Mg in the ED

## 2022-05-03 NOTE — ED Provider Notes (Signed)
[facility] Provider Note   CSN: [id number] Arrival date & time: 05/03/22  1757     History  Chief Complaint  Patient presents with   Palpitations    Kelsey Diaz is a 77 y.o. female.  This is a 77 year old female with history of recurrent chest pain, hypertension, ventricular tachycardia status post ablation, and COPD presenting to the ED for chest pain and palpitations.  Patient states she woke up this morning with substernal chest discomfort, described as a tightness which did not radiate.  She had associated lightheadedness and palpitations which were intermittent.  Her pain continued until this evening when her daughter called EMS.  Patient had multiple episodes of V. tach with EMS.  Upon arrival she is endorsing substernal chest discomfort, no shortness of breath, intermittent lightheadedness and palpitations.    Home Medications Prior to Admission medications   Medication Sig Start Date End Date Taking? Authorizing Provider  ACCU-CHEK GUIDE test strip  07/27/19   [provider]  Accu-Chek Softclix Lancets lancets  07/27/19   [provider]  albuterol (VENTOLIN HFA) 108 (90 Base) MCG/ACT inhaler Inhale 2 puffs into the lungs every 6 (six) hours as needed for shortness of breath.     [provider]  aspirin 81 MG chewable tablet Chew 81 mg by mouth daily.    [provider]  Blood Glucose Monitoring Suppl (ACCU-CHEK GUIDE ME) w/Device KIT  07/27/19   [provider]  Calcium Carbonate-Vitamin D 600-200 MG-UNIT TABS Take 1 tablet by mouth daily.     [provider]  Clobetasol Prop Emollient Base 0.05 % emollient cream Apply 1 application topically 2 (two) times daily. 01/26/19   [provider]  DULoxetine (CYMBALTA) 60 MG capsule Take 60 mg by mouth 2 (two) times daily.    [provider]  etanercept (ENBREL) 50 MG/ML injection Inject 50 mg into the skin once a  week. Friday    [provider]  fluocinonide (LIDEX) 0.05 % external solution Apply 1 application topically 2 (two) times daily as needed (skin irritation).     [provider]  fluticasone (FLONASE) 50 MCG/ACT nasal spray 2 sprays daily as needed for allergies. 05/18/18   [provider]  folic acid (FOLVITE) 1 MG tablet Take 1 tablet by mouth daily.    [provider]  HYDROcodone-acetaminophen (NORCO) 7.5-325 MG tablet Take 1 tablet by mouth every 6 (six) hours as needed for moderate pain or severe pain. 05/16/22 06/15/22  Molli Barrows, MD  meloxicam (MOBIC) 15 MG tablet Take 15 mg by mouth daily.    [provider]  metoprolol succinate (TOPROL XL) 25 MG 24 hr tablet Take 1 tablet (25 mg total) by mouth 2 (two) times daily. 07/15/21   Vickie Epley, MD  Multiple Vitamin (MULTI-VITAMIN PO) Take 1 tablet by mouth daily.     [provider]  nystatin (MYCOSTATIN/NYSTOP) powder 1 application daily as needed (skin irritation). 01/15/21   [provider]  Omega-3 Fatty Acids (FISH OIL PO) Take 1,000 mg by mouth daily.     [provider]  omeprazole (PRILOSEC) 40 MG capsule Take 40 mg by mouth daily. 04/08/17   [provider]  OSPHENA 60 MG TABS Take 1 tablet by mouth daily. 06/25/19   [provider]  rosuvastatin (CRESTOR) 20 MG tablet Take 20 mg by mouth once a week. 01/02/20   [provider]  Tiotropium Bromide-Olodaterol (STIOLTO RESPIMAT)  2.5-2.5 MCG/ACT AERS Inhale 2 puffs into the lungs daily. 07/11/21   Marshell Garfinkel, MD      Allergies    Morphine, Advair diskus [fluticasone-salmeterol], Ambien [zolpidem tartrate], Amlodipine besylate, Azithromycin, Bextra [valdecoxib], Bupropion, Captopril, Irbesartan, Latex, Lisinopril, Pacerone [amiodarone], Pulmicort [budesonide], Savella [milnacipran hcl], Spiriva [tiotropium bromide monohydrate], Suvorexant, and Tiotropium bromide monohydrate     Review of Systems   Review of Systems  Constitutional:  Negative for chills and fever.  Respiratory:  Negative for shortness of breath.   Cardiovascular:  Positive for chest pain and palpitations.  Gastrointestinal:  Negative for abdominal pain.  Neurological:  Positive for weakness and light-headedness. Negative for syncope.    Physical Exam Updated Vital Signs BP (!) 117/102 (BP Location: Right Arm)   Pulse 74   Temp 97.8 F (36.6 C) (Oral)   Resp (!) 21   Ht '5\' 7"'$  (1.702 m)   Wt 113.4 kg   SpO2 100%   BMI 39.16 kg/m  Physical Exam Vitals and nursing note reviewed.  Constitutional:      General: She is not in acute distress.    Appearance: Normal appearance. She is not ill-appearing or toxic-appearing.  HENT:     Head: Normocephalic.     Mouth/Throat:     Mouth: Mucous membranes are moist.  Eyes:     Extraocular Movements: Extraocular movements intact.     Pupils: Pupils are equal, round, and reactive to light.  Cardiovascular:     Rate and Rhythm: Normal rate and regular rhythm.     Pulses: Normal pulses.     Heart sounds: Normal heart sounds. No murmur heard.    No friction rub. No gallop.  Pulmonary:     Effort: Pulmonary effort is normal. No respiratory distress.     Breath sounds: No stridor. No wheezing or rales.  Abdominal:     General: There is no distension.     Palpations: Abdomen is soft.     Tenderness: There is no abdominal tenderness. There is no guarding or rebound.  Skin:    General: Skin is warm.     Capillary Refill: Capillary refill takes less than 2 seconds.  Neurological:     General: No focal deficit present.     Mental Status: She is alert and oriented to person, place, and time.     ED Results / Procedures / Treatments   Labs (all labs ordered are listed, but only abnormal results are displayed) Labs Reviewed  CBC WITH DIFFERENTIAL/PLATELET - Abnormal; Notable for the following components:      Result Value   RBC 2.83 (*)     Hemoglobin 8.7 (*)    HCT 27.1 (*)    Platelets 113 (*)    All other components within normal limits  BASIC METABOLIC PANEL - Abnormal; Notable for the following components:   CO2 20 (*)    Calcium 8.4 (*)    All other components within normal limits  MAGNESIUM - Abnormal; Notable for the following components:   Magnesium 1.6 (*)    All other components within normal limits  URINALYSIS, ROUTINE W REFLEX MICROSCOPIC - Abnormal; Notable for the following components:   APPearance CLOUDY (*)    All other components within normal limits  POC OCCULT BLOOD, ED  TROPONIN I (HIGH SENSITIVITY)  TROPONIN I (HIGH SENSITIVITY)    EKG EKG Interpretation  Date/Time:  Sunday May 03 2022 18:10:52 EDT Ventricular Rate:  138 PR Interval:  83 QRS Duration: 146 QT Interval:  384 QTC Calculation: 582 R Axis:   93 Text Interpretation: Sinus tachycardia Sinus pause Right bundle branch block Repol abnrm, prob ischemia, anterolateral lds Confirmed by Ripley Fraise [phone]) on 05/03/2022 10:43:02 PM  Radiology DG Chest Portable 1 View  Result Date: 05/03/2022 CLINICAL DATA:  Chest pain and palpitations EXAM: PORTABLE CHEST 1 VIEW COMPARISON:  Chest radiograph dated 04/04/2021 FINDINGS: Normal lung volumes. No focal consolidations. No pleural effusion or pneumothorax. The heart size and mediastinal contours are within normal limits. The visualized skeletal structures are unremarkable. Aortic atherosclerosis. IMPRESSION: No active disease. Electronically Signed   By: Darrin Nipper M.D.   On: 05/03/2022 18:37    Procedures Procedures    Medications Ordered in ED Medications  magnesium sulfate IVPB 2 g 50 mL (0 g Intravenous Stopped 05/03/22 2106)  pantoprazole (PROTONIX) injection 40 mg (40 mg Intravenous Given 05/03/22 2143)    ED Course/ Medical Decision Making/ A&P                             Medical Decision Making Patient presents with concern for tachydysrhythmia and chest pain.  Her clinical  history is complicated by previous episodes of ventricular tachycardia requiring ablation, she has no known coronary artery disease per her last heart cath.  Patient had a few episodes of ventricular tachycardia lasting approximately 10-15 beats while in our ED.  He appears to have an allergy to amiodarone, unknown of what this is at this time, we do not have procainamide and instead lidocaine 100 mg bolus with a infusion starting at 1 mg/min were ordered. With her chest pain we will obtain an ACS workup including serial troponins and EKG.  Chest x-ray ordered, BMP, CBC and magnesium ordered to evaluate for electrolyte abnormalities.  Will discuss this case with cardiology.  I personally reviewed and interpreted patient's labs, patient has hypomagnesemia of 1.6, mild hypocalcemia of 8.4, no other electrolyte abnormalities.  CBC shows a hemoglobin of 8.7 which is down from 13.  I performed a chaperoned rectal exam at this time which showed no acute blood in the rectal vault, external hemorrhoids noted.  I personally reviewed and interpreted patient's EKG initial EKG shows sinus rhythm with rate 82, PR, QRS, QTc normal, subsequent EKG does show a run of ventricular tachycardia of approximately 15 beats.  I personally reviewed and interpreted patient's chest x-ray, no focal consolidations, cardiac silhouette within normal limits, no pulmonary edema or pleural effusions, no pneumothorax.  Prior to the lidocaine bolus and lidocaine infusion, patient did not have any more runs of ventricular tachycardia.  At this point I chose to hold the lidocaine bolus and infusion until I spoke with cardiology.  I did give her 2 g of magnesium as well as 40 mg Protonix for presumed GI bleed.  I further discussed this patient's case with cardiology who would like an echo done in the AM.  Plan for amiodarone bolus and infusion if patient has frequent nonsustained ventricular tachycardia or sustained ventricular tachycardia.   Patient remained stable in the emergency department, I called and discussed this case with the hospitalist team who agreed to admit patient to their service for further management with cardiology as consult.  Patient was stable upon admission to hospital.  Problems Addressed: Anemia, unspecified type: acute illness or injury that poses a threat to life or bodily functions Cardiac arrhythmia, unspecified cardiac arrhythmia type: acute illness or injury that poses a threat to life or bodily  functions  Amount and/or Complexity of Data Reviewed Labs: ordered. Decision-making details documented in ED Course. Radiology: ordered and independent interpretation performed. Decision-making details documented in ED Course. ECG/medicine tests: ordered and independent interpretation performed. Decision-making details documented in ED Course.  Risk Prescription drug management. Decision regarding hospitalization.         Final Clinical Impression(s) / ED Diagnoses Final diagnoses:  Anemia, unspecified type  Cardiac arrhythmia, unspecified cardiac arrhythmia type    Rx / DC Orders ED Discharge Orders     None         Jimmie Molly, MD 05/03/22 [id number]    Elnora Morrison, MD 05/04/22 [phone]

## 2022-05-03 NOTE — Assessment & Plan Note (Signed)
-  Not in exacerbation.  Continue home bronchodilator.

## 2022-05-03 NOTE — Assessment & Plan Note (Signed)
Noncompliant with CPAP 

## 2022-05-03 NOTE — H&P (Signed)
History and Physical    Patient: Kelsey Diaz [id number] DOB: February 05, 1946 DOA: 05/03/2022 DOS: the patient was seen and examined on 05/03/2022 PCP: Harlan Stains, MD  Patient coming from: Home  Chief Complaint:  Chief Complaint  Patient presents with   Palpitations   HPI: Kelsey Diaz is a 77 y.o. female with medical history significant of VT, HTN,  RA, non-obstructive CAD, COPD with asthma, OSA presents with chest pain and palpitations.   She reports palpitation and chest discomfort earlier in the morning after getting her coffee.  Episode lasted for several hours and she felt dizzy so family convinced her to come into the ED.  Denies any shortness of breath or nausea or vomiting.  She reports compliance with her beta-blocker.  Pt was hospitalized in 11/2021 with symptoms of weakness and near syncope. Found to be in VT that converted with IV amiodarone bolus. She was admitted to ICU on amiodarone and lidocaine gtt. Had normal troponin at the time at 15 and 33. Subsequently had LHC with essentially normal coronaries. TTE with preserved LVEF of 60-65% and grade 1 diastolic dysfunction.  EP was consulted and she underwent EPS/ablation but VT was not inducible. VT suspected to be outflow tract related. She was discharged with amiodarone and beta-blocker.  Amiodarone later discontinued 01/2021 due to symptoms of dizziness, shaking and forgetfulness.  She was continued on metoprolol.   Today, pt was noted to have several runs of VT with EMS.   In the ED,was found to be in NSR. HR 70s and BP 120/60s.   No leukocytosis, Hgb of 8.7 with prior normal at 13 a year ago. Plt also low at 113. ED resident performed rectal exam but had no stool in rectal vault and only saw external hemorrhoids. FOBT is negative.  She denies any melena but takes daily NSAID for her RA and fibromyalgia.  No alcohol use.  No abdominal pain but does have frequent reflux on PPI.  Na of 137, K is normal at 4.2.  Mg is mildly low at 1.6. creatinine of 0.80.   Troponin reassuring and flat at 14-15.   EKG in NSR with RBBB and LAFB   Review of Systems: As mentioned in the history of present illness. All other systems reviewed and are negative. Past Medical History:  Diagnosis Date   Allergic rhinitis    Anxiety    Asthma    Bilateral hip pain 11/28/2018   Colitis    COPD (chronic obstructive pulmonary disease) ([place])    Fibromyalgia 05/16/2019   GERD (gastroesophageal reflux disease)    With esophageal strictures   Hypertension    Hyperthyroidism    OSA (obstructive sleep apnea) 02/09/2012   No mask.  Did not tolerate   Osteoarthritis    Rheumatoid arthritis ([place]) 10/03/2018   Past Surgical History:  Procedure Laterality Date   BREAST BIOPSY     x2   CATARACT EXTRACTION W/PHACO Left 09/27/2018   Procedure: CATARACT EXTRACTION PHACO AND INTRAOCULAR LENS PLACEMENT ([place]) LEFT;  Surgeon: Birder Robson, MD;  Location: [facility];  Service: Ophthalmology;  Laterality: Left;   CATARACT EXTRACTION W/PHACO Right 11/29/2018   Procedure: CATARACT EXTRACTION PHACO AND INTRAOCULAR LENS PLACEMENT ([place]) RIGHT;  Surgeon: Birder Robson, MD;  Location: [facility];  Service: Ophthalmology;  Laterality: Right;  1:22 20.1% 16.64   CHOLECYSTECTOMY     2005   KNEE SURGERY Right 2013   two torn ligaments repaired.    LEFT HEART CATH AND CORONARY ANGIOGRAPHY  N/A 06/08/2017   Procedure: LEFT HEART CATH AND CORONARY ANGIOGRAPHY;  Surgeon: Troy Sine, MD;  Location: [facility];  Service: Cardiovascular;  Laterality: N/A;   LEFT HEART CATH AND CORONARY ANGIOGRAPHY N/A 12/12/2020   Procedure: LEFT HEART CATH AND CORONARY ANGIOGRAPHY;  Surgeon: Lorretta Harp, MD;  Location: [facility];  Service: Cardiovascular;  Laterality: N/A;   NASAL SINUS SURGERY     V TACH ABLATION N/A 12/16/2020   Procedure: V TACH ABLATION;  Surgeon: Vickie Epley, MD;  Location: [facility];  Service: Cardiovascular;  Laterality: N/A;   VAGINAL HYSTERECTOMY     Social History:  reports that she quit smoking about 23 years ago. Her smoking use included cigarettes. She has a 80.00 pack-year smoking history. She has never used smokeless tobacco. She reports that she does not currently use alcohol. She reports that she does not use drugs.  Allergies  Allergen Reactions   Morphine Other (See Comments)    Stopped heart   Advair Diskus [Fluticasone-Salmeterol]     Sensitivity to smells   Ambien [Zolpidem Tartrate]     irritable   Amlodipine Besylate     Feels bad   Azithromycin     Other reaction(s): Unknown   Bextra [Valdecoxib]     Stomach issues   Bupropion     Other reaction(s): involuntary muscle movements   Captopril     Ache   Irbesartan     Other reaction(s): dizzy   Latex Itching    Bandaids only   Lisinopril     cough   Pacerone [Amiodarone] Other (See Comments)    falls   Pulmicort [Budesonide]     Worse breathing, sensitivity to smells   Savella [Milnacipran Hcl]     Mental fog   Spiriva [Tiotropium Bromide Monohydrate]     Dry mouth   Suvorexant     Other reaction(s): not effective   Tiotropium Bromide Monohydrate     Other reaction(s): dry mouth    Family History  Problem Relation Age of Onset   Hypertension Mother    Diabetes Mother    Allergies Mother    Heart attack Father 62   Cancer Father        kidney   Emphysema Father    Heart disease Father        CHF age 15   Heart attack Brother 53       Died of MI age 54    Prior to Admission medications   Medication Sig Start Date End Date Taking? Authorizing Provider  ACCU-CHEK GUIDE test strip  07/27/19   [provider]  Accu-Chek Softclix Lancets lancets  07/27/19   [provider]  albuterol (VENTOLIN HFA) 108 (90 Base) MCG/ACT inhaler Inhale 2 puffs into the lungs every 6 (six) hours as needed for shortness of breath.     [provider]  aspirin 81  MG chewable tablet Chew 81 mg by mouth daily.    [provider]  Blood Glucose Monitoring Suppl (ACCU-CHEK GUIDE ME) w/Device KIT  07/27/19   [provider]  Calcium Carbonate-Vitamin D 600-200 MG-UNIT TABS Take 1 tablet by mouth daily.     [provider]  Clobetasol Prop Emollient Base 0.05 % emollient cream Apply 1 application topically 2 (two) times daily. 01/26/19   [provider]  DULoxetine (CYMBALTA) 60 MG capsule Take 60 mg by mouth 2 (two) times daily.    [provider]  etanercept (ENBREL) 50 MG/ML injection Inject 50 mg into the skin once a week. Friday    [provider]  fluocinonide (LIDEX) 0.05 % external solution Apply 1 application topically 2 (two) times daily as needed (skin irritation).     [provider]  fluticasone (FLONASE) 50 MCG/ACT nasal spray 2 sprays daily as needed for allergies. 05/18/18   [provider]  folic acid (FOLVITE) 1 MG tablet Take 1 tablet by mouth daily.    [provider]  HYDROcodone-acetaminophen (NORCO) 7.5-325 MG tablet Take 1 tablet by mouth every 6 (six) hours as needed for moderate pain or severe pain. 05/16/22 06/15/22  Molli Barrows, MD  meloxicam (MOBIC) 15 MG tablet Take 15 mg by mouth daily.    [provider]  metoprolol succinate (TOPROL XL) 25 MG 24 hr tablet Take 1 tablet (25 mg total) by mouth 2 (two) times daily. 07/15/21   Vickie Epley, MD  Multiple Vitamin (MULTI-VITAMIN PO) Take 1 tablet by mouth daily.     [provider]  nystatin (MYCOSTATIN/NYSTOP) powder 1 application daily as needed (skin irritation). 01/15/21   [provider]  Omega-3 Fatty Acids (FISH OIL PO) Take 1,000 mg by mouth daily.     [provider]  omeprazole (PRILOSEC) 40 MG capsule Take 40 mg by mouth daily. 04/08/17   [provider]  OSPHENA 60 MG TABS Take 1 tablet by mouth daily. 06/25/19   [provider]  rosuvastatin  (CRESTOR) 20 MG tablet Take 20 mg by mouth once a week. 01/02/20   [provider]  Tiotropium Bromide-Olodaterol (STIOLTO RESPIMAT) 2.5-2.5 MCG/ACT AERS Inhale 2 puffs into the lungs daily. 07/11/21   Marshell Garfinkel, MD    Physical Exam: Vitals:   05/03/22 2115 05/03/22 2130 05/03/22 2145 05/03/22 2244  BP: 111/81 123/65 121/68 (!) 117/102  Pulse: 77 73 76 74  Resp: 19 (!) 22 (!) 26 (!) 21  Temp:    97.8 F (36.6 C)  TempSrc:    Oral  SpO2: 100% 100% 98% 100%  Weight:      Height:       Constitutional: NAD, calm, comfortable, elderly female laying flat in bed Eyes: lids and conjunctivae normal ENMT: Mucous membranes are moist.  Neck: normal, supple Respiratory: clear to auscultation bilaterally, no wheezing, no crackles. Normal respiratory effort. No accessory muscle use.  Cardiovascular: Regular rate and rhythm, no murmurs / rubs / gallops. No extremity edema.   Abdomen: no tenderness, soft, nondistended, no masses palpated. bowel sounds positive.  Musculoskeletal: no clubbing / cyanosis. No joint deformity upper and lower extremities. Good ROM, no contractures. Normal muscle tone.  Skin: no rashes, lesions, ulcers. No induration Neurologic: CN 2-12 grossly intact.  Strength 5/5 in all 4.  Psychiatric: Normal judgment and insight. Alert and oriented x 3. Normal mood. Data Reviewed:  See HPI  Assessment and Plan: * Ventricular tachycardia ([place]) -Hx of VT in 2022 s/p ablation thought to be outflow tract related. Had [place] with essentially normal coronaries. TTE with preserved LVEF of 60-65% and grade 1 diastolic dysfunction. Discharged with amiodarone and metoprolol but amiodarone later discontinued due to neuro symptoms.  -Now on metoprolol succinate 12.'5mg'$  BID at home. Split dose reportedly due to BP issues per family -Mg was slightly low and has been replenished with IV Mg. Continue to keep Mg >2 and K >4 - cardiology Dr. Humphrey Rolls has evaluated and recommends monitoring and  can start Lidocaine bolus/infusion if she  was to developed sustained VT overnight.  -Currently in NSR and will keep on continuous telemetry. Admit to stepdown unit -obtain echocardiogram in the morning -also check TSH level   Normocytic anemia -Hgb of 8.7 with prior normal at 13 a year ago -FOBT negative but no stool in stool vault. Denies any melena. She is on daily NSAIDS. No alcohol use.  -last colonoscopy in 2012 essentially normal other than internal hemorrhoids  -check iron panel, ferritin, vitamin [id number] and folic -Repeat hemoglobin in the morning -If anemia panel negative and hemoglobin remained stable, recommend follow-up with GI outpatient  Hypomagnesemia -mildly low at 1.6 -administered IV Mg in the ED  Rheumatoid arthritis ([place]) -on Enbrel weekly which will be held inpatient  HTN (hypertension) -Continue valsartan  OSA (obstructive sleep apnea) Non-compliant with CPAP  COPD with asthma -Not in exacerbation.  Continue home bronchodilator.      Advance Care Planning: Full   Consults: Cardiology  Family Communication: Younger daughter at bedside  Severity of Illness: The appropriate patient status for this patient is OBSERVATION. Observation status is judged to be reasonable and necessary in order to provide the required intensity of service to ensure the patient's safety. The patient's presenting symptoms, physical exam findings, and initial radiographic and laboratory data in the context of their medical condition is felt to place them at decreased risk for further clinical deterioration. Furthermore, it is anticipated that the patient will be medically stable for discharge from the hospital within 2 midnights of admission.   Author: Orene Desanctis, DO 05/03/2022 11:46 PM  For on call review [email or web address].

## 2022-05-03 NOTE — ED Triage Notes (Signed)
Pt arrived via ems for chest pain that started when pt awoke and palpitations pt reports that this has happened before Pt reports dizziness and near syncope

## 2022-05-03 NOTE — Assessment & Plan Note (Signed)
-  on Enbrel weekly which will be held inpatient

## 2022-05-03 NOTE — Assessment & Plan Note (Signed)
Continue valsartan

## 2022-05-03 NOTE — ED Notes (Signed)
Medication on hold at bedside

## 2022-05-04 ENCOUNTER — Observation Stay (HOSPITAL_BASED_OUTPATIENT_CLINIC_OR_DEPARTMENT_OTHER): Payer: Medicare HMO

## 2022-05-04 ENCOUNTER — Encounter (HOSPITAL_COMMUNITY): Payer: Self-pay | Admitting: Family Medicine

## 2022-05-04 DIAGNOSIS — I472 Ventricular tachycardia, unspecified: Secondary | ICD-10-CM | POA: Diagnosis not present

## 2022-05-04 DIAGNOSIS — I10 Essential (primary) hypertension: Secondary | ICD-10-CM | POA: Diagnosis not present

## 2022-05-04 LAB — BASIC METABOLIC PANEL
Anion gap: 7 (ref 5–15)
BUN: 16 mg/dL (ref 8–23)
CO2: 26 mmol/L (ref 22–32)
Calcium: 8.7 mg/dL — ABNORMAL LOW (ref 8.9–10.3)
Chloride: 103 mmol/L (ref 98–111)
Creatinine, Ser: 0.83 mg/dL (ref 0.44–1.00)
GFR, Estimated: >60 mL/min (ref >60)
Glucose, Bld: 150 mg/dL — ABNORMAL HIGH (ref 70–99)
Potassium: 4.1 mmol/L (ref 3.5–5.1)
Sodium: 136 mmol/L (ref 135–145)

## 2022-05-04 LAB — ECHOCARDIOGRAM COMPLETE
AR max vel: 3.55 cm2
AV Area VTI: 3.2 cm2
AV Area mean vel: 3.43 cm2
AV Mean grad: 4 mmHg
AV Peak grad: 7.6 mmHg
Ao pk vel: 1.38 m/s
Area-P 1/2: 2.95 cm2
Height: 66 in
S' Lateral: 3.2 cm
Weight: 3132.3 [oz_av]

## 2022-05-04 LAB — CBC
HCT: 40.5 % (ref 36.0–46.0)
Hemoglobin: 13.4 g/dL (ref 12.0–15.0)
MCH: 30.6 pg (ref 26.0–34.0)
MCHC: 33.1 g/dL (ref 30.0–36.0)
MCV: 92.5 fL (ref 80.0–100.0)
Platelets: 174 K/uL (ref 150–400)
RBC: 4.38 MIL/uL (ref 3.87–5.11)
RDW: 13.4 % (ref 11.5–15.5)
WBC: 5.3 K/uL (ref 4.0–10.5)
nRBC: 0 % (ref 0.0–0.2)

## 2022-05-04 LAB — MRSA NEXT GEN BY PCR, NASAL: MRSA by PCR Next Gen: NOT DETECTED

## 2022-05-04 LAB — FOLATE: Folate: >40 ng/mL (ref >5.9)

## 2022-05-04 LAB — FERRITIN: Ferritin: 14 ng/mL (ref 11–307)

## 2022-05-04 LAB — TSH: TSH: 0.936 u[IU]/mL (ref 0.350–4.500)

## 2022-05-04 LAB — IRON AND TIBC
Iron: 135 ug/dL (ref 28–170)
Saturation Ratios: 30 % (ref 10.4–31.8)
TIBC: 452 ug/dL — ABNORMAL HIGH (ref 250–450)
UIBC: 317 ug/dL

## 2022-05-04 LAB — VITAMIN B12: Vitamin B-12: 670 pg/mL (ref 180–914)

## 2022-05-04 MED ORDER — DICLOFENAC SODIUM 1 % EX GEL: 2.0000 g | Freq: Four times a day (QID) | CUTANEOUS | Status: DC | PRN

## 2022-05-04 MED ORDER — METOPROLOL SUCCINATE ER 25 MG PO TB24
12.5000 mg | ORAL_TABLET | Freq: Once | ORAL | Status: AC
  Administered 2022-05-04: 12.5 mg via ORAL
  Filled 2022-05-04: qty 1

## 2022-05-04 MED ORDER — MAGNESIUM OXIDE -MG SUPPLEMENT 400 (240 MG) MG PO TABS: 400.0000 mg | ORAL_TABLET | Freq: Every day | ORAL | Status: DC

## 2022-05-04 MED ORDER — METOPROLOL SUCCINATE ER 25 MG PO TB24: 25.0000 mg | ORAL_TABLET | Freq: Two times a day (BID) | ORAL | Status: DC

## 2022-05-04 MED ORDER — METOPROLOL SUCCINATE ER 25 MG PO TB24: 25.0000 mg | ORAL_TABLET | Freq: Two times a day (BID) | ORAL | 1 refills | Status: DC

## 2022-05-04 MED ORDER — MAGNESIUM SULFATE 2 GM/50ML IV SOLN
2.0000 g | Freq: Once | INTRAVENOUS | Status: AC
  Administered 2022-05-04: 2 g via INTRAVENOUS
  Filled 2022-05-04: qty 50

## 2022-05-04 MED ORDER — MAGNESIUM OXIDE -MG SUPPLEMENT 400 (240 MG) MG PO TABS: 400.0000 mg | ORAL_TABLET | Freq: Every day | ORAL | 1 refills | Status: AC

## 2022-05-04 MED ORDER — MUSCLE RUB 10-15 % EX CREA
TOPICAL_CREAM | CUTANEOUS | Status: DC | PRN
  Filled 2022-05-04: qty 85

## 2022-05-04 NOTE — Care Management Obs Status (Signed)
[place] NOTIFICATION   Patient Details  Name: DARBI WOHLERS MRN: [id number] Date of Birth: [birth year]   Medicare Observation Status Notification Given:  Yes    Cyndi Bender, RN 05/04/2022, 2:33 PM

## 2022-05-04 NOTE — Progress Notes (Signed)
Echocardiogram 2D Echocardiogram has been performed.  Kelsey Diaz 05/04/2022, 3:36 PM

## 2022-05-04 NOTE — Consult Note (Signed)
Cardiology Consultation   Patient ID: Kelsey Diaz MRN: [id number]; DOB: 1945-03-09  Admit date: 05/03/2022 Date of Consult: 05/04/2022  PCP:  Harlan Stains, MD   [facility] Providers Cardiologist:  Minus Breeding, MD  Electrophysiologist:  Vickie Epley, MD       Patient Profile:   Kelsey Diaz is a 77 y.o. female with a hx of VT, HTN, RA, non-obstructive CAD, COPD with asthma, OSA  who is being seen 05/04/2022 for the evaluation of NSVT at the request of Dr. Flossie Buffy  History of Present Illness:   Kelsey Diaz is a 77 y.o. female with a hx of VT, HTN, RA, non-obstructive CAD, COPD with asthma, OSA  who is being seen 05/04/2022 for the evaluation of NSVT at the request of Dr. Flossie Buffy. She was hospitalized in 11/2020 with near syncope. Was in  VT that converted with IV amiodarone bolus. She was admitted to ICU on amiodarone and lidocaine gtt. TTE with preserved LVEF of 60-65% and grade 1 diastolic dysfunction. EP was consulted and she underwent EPS/ablation but VT was not inducible. At that time EP thought it is outflow tract related. She was discharged with amiodarone and beta-blocker. However she had side effects from Amio hence was stopped. She has been doing fine since then. Today, felt dizzy and has had palpitations on and off from AM hence came to the ED through EMS. She had several runs of NSVT with EMS and in the ED. All self terminated. Currently, NSR. HR 70s and BP 120/60s. MG was 1.6; Hb was 8.7. Cardiology was consulted for NSVT/VT tach episodes. Since Mg repletion, no more episodes. She was admitted to the hospitalist service for anemia work up.    Past Medical History:  Diagnosis Date   Allergic rhinitis    Anxiety    Asthma    Bilateral hip pain 11/28/2018   Colitis    COPD (chronic obstructive pulmonary disease) ([place])    Fibromyalgia 05/16/2019   GERD (gastroesophageal reflux disease)    With esophageal strictures   Hypertension    Hyperthyroidism    OSA  (obstructive sleep apnea) 02/09/2012   No mask.  Did not tolerate   Osteoarthritis    Rheumatoid arthritis ([place]) 10/03/2018    Past Surgical History:  Procedure Laterality Date   BREAST BIOPSY     x2   CATARACT EXTRACTION W/PHACO Left 09/27/2018   Procedure: CATARACT EXTRACTION PHACO AND INTRAOCULAR LENS PLACEMENT ([place]) LEFT;  Surgeon: Birder Robson, MD;  Location: [facility];  Service: Ophthalmology;  Laterality: Left;   CATARACT EXTRACTION W/PHACO Right 11/29/2018   Procedure: CATARACT EXTRACTION PHACO AND INTRAOCULAR LENS PLACEMENT ([place]) RIGHT;  Surgeon: Birder Robson, MD;  Location: [facility];  Service: Ophthalmology;  Laterality: Right;  1:22 20.1% 16.64   CHOLECYSTECTOMY     2005   KNEE SURGERY Right 2013   two torn ligaments repaired.    LEFT HEART CATH AND CORONARY ANGIOGRAPHY N/A 06/08/2017   Procedure: LEFT HEART CATH AND CORONARY ANGIOGRAPHY;  Surgeon: Troy Sine, MD;  Location: [facility];  Service: Cardiovascular;  Laterality: N/A;   LEFT HEART CATH AND CORONARY ANGIOGRAPHY N/A 12/12/2020   Procedure: LEFT HEART CATH AND CORONARY ANGIOGRAPHY;  Surgeon: Lorretta Harp, MD;  Location: [facility];  Service: Cardiovascular;  Laterality: N/A;   NASAL SINUS SURGERY     V TACH ABLATION N/A 12/16/2020   Procedure: V TACH ABLATION;  Surgeon: Vickie Epley, MD;  Location: [facility];  Service: Cardiovascular;  Laterality: N/A;   VAGINAL HYSTERECTOMY         Inpatient Medications: Scheduled Meds:  arformoterol  15 mcg Nebulization BID   And   umeclidinium bromide  1 puff Inhalation Daily   aspirin  81 mg Oral Daily   DULoxetine  60 mg Oral BID   enoxaparin (LOVENOX) injection  40 mg Subcutaneous Q24H   irbesartan  150 mg Oral Daily   meloxicam  15 mg Oral Daily   metoprolol succinate  12.5 mg Oral BID   Ospemifene  1 tablet Oral Daily   pantoprazole  40 mg Oral Daily   Continuous Infusions:  PRN  Meds: HYDROcodone-acetaminophen  Allergies:    Allergies  Allergen Reactions   Morphine Other (See Comments)    Stopped heart   Advair Diskus [Fluticasone-Salmeterol]     Sensitivity to smells   Ambien [Zolpidem Tartrate]     irritable   Amlodipine Besylate     Feels bad   Azithromycin     Other reaction(s): Unknown   Bextra [Valdecoxib]     Stomach issues   Bupropion     Other reaction(s): involuntary muscle movements   Captopril     Ache   Irbesartan     Other reaction(s): dizzy   Latex Itching    Bandaids only   Lisinopril     cough   Pacerone [Amiodarone] Other (See Comments)    falls   Pulmicort [Budesonide]     Worse breathing, sensitivity to smells   Savella [Milnacipran Hcl]     Mental fog   Spiriva [Tiotropium Bromide Monohydrate]     Dry mouth   Suvorexant     Other reaction(s): not effective   Tiotropium Bromide Monohydrate     Other reaction(s): dry mouth    Social History:   Social History   Socioeconomic History   Marital status: Married    Spouse name: Not on file   Number of children: 3   Years of education: Not on file   Highest education level: Not on file  Occupational History   Occupation: Retired    Fish farm manager: [employer]  Tobacco Use   Smoking status: Former    Packs/day: 2.00    Years: 40.00    Total pack years: 80.00    Types: Cigarettes    Quit date: 02/24/1999    Years since quitting: 23.2   Smokeless tobacco: Never  Vaping Use   Vaping Use: Never used  Substance and Sexual Activity   Alcohol use: Not Currently    Comment:     Drug use: No   Sexual activity: Not on file  Other Topics Concern   Not on file  Social History Narrative   Lives with husband.  5 children together. (She has 3).     Social Determinants of Health   Financial Resource Strain: Not on file  Food Insecurity: Not on file  Transportation Needs: Not on file  Physical Activity: Not on file  Stress: Not on file  Social Connections: Not on file  Intimate  Partner Violence: Not on file    Family History:   Family History  Problem Relation Age of Onset   Hypertension Mother    Diabetes Mother    Allergies Mother    Heart attack Father 76   Cancer Father        kidney   Emphysema Father    Heart disease Father  CHF age 52   Heart attack Brother 9       Died of MI age 53     ROS:  Please see the history of present illness.  All other ROS reviewed and negative.     Physical Exam/Data:   Vitals:   05/03/22 2345 05/04/22 0000 05/04/22 0002 05/04/22 0015  BP: 95/75 124/85 124/85 132/78  Pulse: 74 83 89   Resp: (!) 25 17  (!) 21  Temp:      TempSrc:      SpO2: 100% 100%    Weight:      Height:        Intake/Output Summary (Last 24 hours) at 05/04/2022 0112 Last data filed at 05/03/2022 2106 Gross per 24 hour  Intake 850 ml  Output --  Net 850 ml      05/03/2022    6:10 PM 04/07/2022    3:41 PM 04/06/2022   12:55 PM  Last 3 Weights  Weight (lbs) 250 lb 197 lb 200 lb  Weight (kg) 113.399 kg 89.359 kg 90.719 kg     Body mass index is 39.16 kg/m.  General:  Well nourished, well developed, in no acute distress HEENT: normal Neck: no JVD Vascular: No carotid bruits; Distal pulses 2+ bilaterally Cardiac:  normal S1, S2; RRR; no murmur  Lungs:  clear to auscultation bilaterally, no wheezing, rhonchi or rales  Abd: soft, nontender, no hepatomegaly  Ext: no edema Musculoskeletal:  No deformities, BUE and BLE strength normal and equal Skin: warm and dry  Neuro:  CNs 2-12 intact, no focal abnormalities noted Psych:  Normal affect   EKG:  The EKG was personally reviewed and demonstrates:  NSR currently Telemetry:  Telemetry was personally reviewed and demonstrates:  NSVT several; lasting 10 seconds.    Laboratory Data:  High Sensitivity Troponin:   Recent Labs  Lab 05/03/22 1815 05/03/22 2005  TROPONINIHS 14 15     Chemistry Recent Labs  Lab 05/03/22 1815  NA 137  K 4.2  CL 106  CO2 20*  GLUCOSE 94   BUN 15  CREATININE 0.80  CALCIUM 8.4*  MG 1.6*  GFRNONAA >60  ANIONGAP 11    No results for input(s): "PROT", "ALBUMIN", "AST", "ALT", "ALKPHOS", "BILITOT" in the last 168 hours. Lipids No results for input(s): "CHOL", "TRIG", "HDL", "LABVLDL", "LDLCALC", "CHOLHDL" in the last 168 hours.  Hematology Recent Labs  Lab 05/03/22 1815  WBC 4.2  RBC 2.83*  HGB 8.7*  HCT 27.1*  MCV 95.8  MCH 30.7  MCHC 32.1  RDW 13.5  PLT 113*   Thyroid No results for input(s): "TSH", "FREET4" in the last 168 hours.  BNPNo results for input(s): "BNP", "PROBNP" in the last 168 hours.  DDimer No results for input(s): "DDIMER" in the last 168 hours.   Radiology/Studies:  DG Chest Portable 1 View  Result Date: 05/03/2022 CLINICAL DATA:  Chest pain and palpitations EXAM: PORTABLE CHEST 1 VIEW COMPARISON:  Chest radiograph dated 04/04/2021 FINDINGS: Normal lung volumes. No focal consolidations. No pleural effusion or pneumothorax. The heart size and mediastinal contours are within normal limits. The visualized skeletal structures are unremarkable. Aortic atherosclerosis. IMPRESSION: No active disease. Electronically Signed   By: Darrin Nipper M.D.   On: 05/03/2022 18:37     Assessment and Plan:   # NSVT/VT # HypoMg # Anemia  -Hx of VT in 2022 s/p EPS thought to be outflow tract related but no ablation was done as was not inducible in the  lab. Had LHC with essentially normal coronaries. TTE with preserved LVEF of 60-65% and grade 1 diastolic dysfunction.  -Was on Amiodarone-but stopped due to side effects -She has been doing well on metoprolol -Now coming again with similar symptoms -Telemetry -Continue metoprolol -Echo in AM -Replete Mg >2 (current 1.6) -If more NSVT happens, will start IV lidocaine with plans for mexiletine as outpatient.  -Will ask EP in AM if another EPS required.   For questions or updates, please contact [place] Please consult [email or web address] for contact info  under    Signed, Jaci Lazier, MD  05/04/2022 1:12 AM

## 2022-05-04 NOTE — Consult Note (Addendum)
ELECTROPHYSIOLOGY CONSULT NOTE    Patient ID: Kelsey Diaz MRN: [id number], DOB/AGE: [birth year] 78 y.o.  Admit date: 05/03/2022 Date of Consult: 05/04/2022  Primary Physician: Harlan Stains, MD Primary Cardiologist: Minus Breeding, MD  Electrophysiologist: Dr. Quentin Ore   Referring Provider: Dr. Humphrey Rolls  Patient Profile: Kelsey Diaz is a 77 y.o. female with a history of RA, FM, Non-obstructive CAD, OSA on CPAP, HTN, and NSVT/VT who is being seen today for the evaluation of near syncope at the request of Dr. Humphrey Rolls.  HPI:  Kelsey Diaz is a 77 y.o. female with medical history as above.   Previously admitted to [facility] 12/11/20 with recurrent near syncope EMS was called, found in VT that broke with amio bolus > gtt and lidocaine as well with recurrent episodes. EP consulted, suspect outflow tract VT, amio/lidocaine eventually stopped and planned for EPS which was non-inducible. Discharged on amiodarone and metoprolol. No indication for ICD with structurally normal heat/and automatic focus of VT.  Amiodarone stopped 01/2021 with c/o dizziness, shaking, forgetfulness  Last seen in office 04/07/2022, hadn't had any symptoms of VT.   Pt called EMS 3/10 with symptoms of palpitations and dizziness, similar to her prior VT episodes. Noted to have several episodes of NSVT with EMS and in the ED.   Mg 1.6 on arrival and supped. Hgb 8.7, but likely erroneous as today is up to 13.4.  Pt states she was usual health until yesterday am and just didn't "feel right". Symptoms felt similar to her prior VT, but less severe. She denies any recent illness or missed medications. She was constipated last week and took several suppositories and benefiber, but denies over-correcting/diarrhea. No syncope, but near syncope several times. Denies undue SOB, CP, or edema.   Labs Potassium4.1 (03/11 0434) Magnesium  1.6* (03/10 1815) Creatinine, ser  0.83 (03/11 0434) PLT  174 (03/11 0434) HGB  13.4 (03/11  0434) WBC 5.3 (03/11 0434) Troponin I (High Sensitivity)15 (03/10 2005).    Allergies, Past Medical, Surgical, Social, and Family Histories have been reviewed and are referenced here-in when relevant for medical decision making.   Current Outpatient Medications  Medication Instructions   albuterol (VENTOLIN HFA) 108 (90 Base) MCG/ACT inhaler 2 puffs, Inhalation, As needed   aspirin 81 mg, Oral, Daily   Calcium Carbonate-Vitamin D 600-200 MG-UNIT TABS 1 tablet, Oral, Daily   Carboxymethylcellulose Sodium (EYE DROPS OP) 2 drops, Both Eyes, As needed   Clobetasol Prop Emollient Base 0.05 % emollient cream 1 application , Topical, 2 times daily   CRANBERRY PO 1 tablet, Oral, Daily   DULoxetine (CYMBALTA) 60 mg, Oral, 2 times daily   Enbrel 50 mg, Subcutaneous, Weekly   fluocinonide (LIDEX) 0.05 % external solution 1 application , 2 times daily PRN   fluticasone (FLONASE) 50 MCG/ACT nasal spray 2 sprays, Each Nare, Daily at bedtime   [START ON 05/16/2022] HYDROcodone-acetaminophen (NORCO) 7.5-325 MG tablet 1 tablet, Oral, Every 6 hours PRN   ibuprofen (ADVIL) 400 mg, Oral, As needed   meloxicam (MOBIC) 15 mg, Oral, Daily   metoprolol succinate (TOPROL XL) 25 mg, Oral, 2 times daily   Multiple Vitamin (MULTI-VITAMIN PO) 1 tablet, Oral, Daily   nystatin (MYCOSTATIN/NYSTOP) powder 1 application , Daily PRN   Omega-3 Fatty Acids (FISH OIL PO) 2 capsules, Oral, Daily   omeprazole (PRILOSEC) 40 mg, Oral, Daily   Osphena 60 mg, Oral, Daily   rosuvastatin (CRESTOR) 20 mg, Oral, Weekly   Tiotropium Bromide-Olodaterol (STIOLTO RESPIMAT) 2.5-2.5 MCG/ACT AERS 2  puffs, Inhalation, Daily   valsartan (DIOVAN) 160 mg, Oral, Daily   Physical Exam: Vitals:   05/04/22 0445 05/04/22 0500 05/04/22 0544 05/04/22 0728  BP: 134/66 128/68 138/73 127/73  Pulse: 63 65 60 77  Resp: (!) 22 (!) '21 12 20  '$ Temp:   98.1 F (36.7 C) 98.1 F (36.7 C)  TempSrc:   Oral Oral  SpO2: 100% 100% 97% 93%  Weight:   88.8 kg    Height:   '5\' 6"'$  (1.676 m)     GEN- NAD, A&O x 3, normal affect HEENT: Normocephalic, atraumatic Lungs- CTAB, Normal effort.  Heart- Regular rate and rhythm, No M/G/R.  GI- Soft, NT, ND.  Extremities- No clubbing, cyanosis, or edema   Radiology/Studies: CXR 3/10 - no active disease.   EKG:05/03/2022 shows NSR with NSVT (personally reviewed)  TELEMETRY: NSR 60-70s currently, several runs of NSVT noted in ED, but has since settled out and not recurred.  (personally reviewed)  Assessment/Plan: Ventricular tachycardia These are her first (symptomatic) episodes since coming off amiodarone.  Echo 11/2020 LVEF 60-65%, Grade 1 DD. -> Update pending cMRI 11/2020 EF 53%, mild mid myocardial gad uptake slightly worse in basal septum, non-specific.  Increase toprol back to 25 mg BID -> previously down-titrated due to chronic fatigue. Pt did not notice a difference with down titration.  Consider mexitil as previously discussed.  Potassium4.1 (03/11 0434) Magnesium  1.6* (03/10 1815) Creatinine, ser  0.83 (03/11 0434) Keep K > 4.0 and Mg > 2.0. Will give an additional 2g Mg and start oral Mg.   H/o amiodarone associated neuropathy Not a candidate to be re-challenged.  Chronic fatigue Multifactorial  Dr. Quentin Ore has seen. Can go home no increased Toprol and new Mag supp after Echo is done.  Close outpatient follow up arranged.   For questions or updates, please contact [place] Please consult [email or web address] for contact info under Cardiology/STEMI.  Jacalyn Lefevre, PA-C  05/04/2022 7:59 AM

## 2022-05-04 NOTE — TOC Initial Note (Signed)
Transition of Care [facility]) - Initial/Assessment Note    Patient Details  Name: Kelsey Diaz MRN: [id number] Date of Birth: 19-Aug-1945  [facility] CM/SW Contact:    Cyndi Bender, RN Phone Number: 05/04/2022, 2:44 PM  Clinical Narrative:                  Spoke to patient at bedside regarding transition needs.  Patient needs ECHO prior to discharge.  This RNCM spoke to [facility] department and patient is on the list for an echo. Patient lives with her daughter and has family support.  No TOC needs at this time. Expected Discharge Plan: Home/Self Care Barriers to Discharge: Continued Medical Work up   Patient Goals and CMS Choice Patient states their goals for this hospitalization and ongoing recovery are:: return home          Expected Discharge Plan and Services                                              Prior Living Arrangements/Services     Patient language and need for interpreter reviewed:: Yes Do you feel safe going back to the place where you live?: Yes      Need for Family Participation in Patient Care: Yes (Comment) Care giver support system in place?: Yes (comment)   Criminal Activity/Legal Involvement Pertinent to Current Situation/Hospitalization: No - Comment as needed  Activities of Daily Living Home Assistive Devices/Equipment: Cane (specify quad or straight), Walker (specify type) ADL Screening (condition at time of admission) Patient's cognitive ability adequate to safely complete daily activities?: Yes Is the patient deaf or have difficulty hearing?: No Does the patient have difficulty seeing, even when wearing glasses/contacts?: No Does the patient have difficulty concentrating, remembering, or making decisions?: No Patient able to express need for assistance with ADLs?: Yes Does the patient have difficulty dressing or bathing?: No Independently performs ADLs?: Yes (appropriate for developmental age) Does the patient  have difficulty walking or climbing stairs?: No Weakness of Legs: Both Weakness of Arms/Hands: None  Permission Sought/Granted                  Emotional Assessment Appearance:: Appears stated age Attitude/Demeanor/Rapport: Gracious Affect (typically observed): Accepting Orientation: : Oriented to Self, Oriented to Place, Oriented to Situation, Oriented to  Time Alcohol / Substance Use: Not Applicable Psych Involvement: No (comment)  Admission diagnosis:  Ventricular tachycardia ([place]) [I47.20] Cardiac arrhythmia, unspecified cardiac arrhythmia type [I49.9] Anemia, unspecified type [D64.9] Patient Active Problem List   Diagnosis Date Noted   Hypomagnesemia 05/03/2022   Normocytic anemia 05/03/2022   Thrombocytopenia ([place]) 05/03/2022   UTI (urinary tract infection) 04/06/2021   Bacteremia 04/05/2021   Sepsis due to Escherichia coli (E. coli) ([place]) 04/05/2021   E-coli UTI 04/04/2021   Ventricular tachycardia ([place]) 12/11/2020   SOB (shortness of breath) 07/02/2019   Educated about COVID-19 virus infection 07/02/2019   Fibromyalgia 05/16/2019   Bilateral hip pain 11/28/2018   Hand pain 11/28/2018   Rheumatoid arthritis ([place]) 10/03/2018   HTN (hypertension) 09/13/2017   PNA (pneumonia) 09/13/2017   Abnormal nuclear stress test 06/03/2017   Chest pain 06/03/2017   Dizzy 06/13/2012   OSA (obstructive sleep apnea) 02/09/2012   COPD with asthma 09/07/2011   PCP:  Harlan Stains, MD Pharmacy:   [id number] [place] [place] [address] Phone: [phone] Fax: [phone]     Social Determinants of Health (SDOH) Social History: SDOH Screenings   Food Insecurity: No Food Insecurity (05/04/2022)  Housing: Low Risk  (05/04/2022)  Transportation Needs: No Transportation Needs (05/04/2022)  Utilities: Not At Risk (05/04/2022)  Depression (PHQ2-9): Low Risk  (04/06/2022)  Tobacco Use: Medium Risk (05/04/2022)   SDOH  Interventions: Housing Interventions: Intervention Not Indicated   Readmission Risk Interventions     No data to display

## 2022-05-04 NOTE — Progress Notes (Signed)
Brand Males to be D/C'd home per MD order. Discussed with the patient and all questions fully answered.  Skin clean, dry and intact without evidence of skin break down, no evidence of skin tears noted.  IV catheter discontinued intact. Site without signs and symptoms of complications. Dressing and pressure applied.  An After Visit Summary was printed and given to the patient.  Patient escorted via [place], and D/C home via private auto.  Kelsey Diaz  05/04/2022 5:51 PM

## 2022-05-04 NOTE — Discharge Summary (Signed)
Physician Discharge Summary  Kelsey Diaz [id number] DOB: 1945-10-18 DOA: 05/03/2022  PCP: Harlan Stains, MD  Admit date: 05/03/2022 Discharge date: 05/04/2022  Admitted From: Home Disposition: Home  Recommendations for Outpatient Follow-up:  Follow up with PCP in 1-2 weeks Follow-up with cardiology as scheduled  Home Health: None Equipment/Devices: None  Discharge Condition: Stable CODE STATUS: Full Diet recommendation: Low-salt low-fat low-carb diet  Brief/Interim Summary:  Kelsey Diaz is a 77 y.o. female with medical history significant of VT, HTN,  RA, non-obstructive CAD, COPD with asthma, OSA presents with chest pain and palpitations. She reports palpitation and chest discomfort earlier in the morning after getting her coffee.  Episode lasted for several hours and she felt dizzy so family convinced her to come into the ED.  Denies any shortness of breath or nausea or vomiting.  She reports compliance with her beta-blocker.   Pt was recently hospitalized in 11/2021 with symptoms of weakness and near syncope. Found to be in VT that converted with IV amiodarone bolus. Subsequently had LHC with essentially normal coronaries. TTE with preserved LVEF of 60-65% and grade 1 diastolic dysfunction. EP was consulted and ablation was attempted but VT was not inducible. VT suspected to be outflow tract related. She was discharged with amiodarone and beta-blocker. Amiodarone later discontinued 01/2021 due to symptoms of dizziness, shaking and forgetfulness. She was continued on metoprolol.    Patient admitted from home having recurrent symptoms of weakness and dizziness EMS noted several runs of V. tach in the field.  Patient admitted as above with cardiology consult, increased metoprolol dose with appropriate response.  No need for for IV lidocaine or amiodarone given patient's resolution of symptoms and heart rate is well-controlled. Repeat Echo EF 50-55% with grade 1 diastolic  dysfunction - otherwise stable for discharge.   Discharge Diagnoses:  Principal Problem:   Ventricular tachycardia ([place]) Active Problems:   COPD with asthma   OSA (obstructive sleep apnea)   HTN (hypertension)   Rheumatoid arthritis (HCC)   Hypomagnesemia   Normocytic anemia   Thrombocytopenia (HCC)  Ventricular tachycardia (HCC) -Improved on increased dose of metoprolol, magnesium supplementation also given -TSH, B12, folate, troponin and UA WNL   Chronic normocytic anemia -Hgb of 8.7 with prior normal at 13 a year ago -FOBT negative but no stool in stool vault. Denies any melena. She is on daily NSAIDS. No alcohol use.  -last colonoscopy in 2012 essentially normal other than internal hemorrhoids  -Anemia workup unremarkable - B12, folate, Iron panel WNL(other that isolated TIBC elevation)   Hypomagnesemia -mildly low at 1.6 -Improved with IV magnesium, continue supplement at discharge   Rheumatoid arthritis ([place]) -on Enbrel weekly -resume normal schedule after discharge   HTN (hypertension) -Continue valsartan   OSA (obstructive sleep apnea) Reports noncompliance with CPAP   Chronic COPD with asthma -At baseline, not in exacerbation.  Continue home bronchodilator.   Discharge Instructions   Allergies as of 05/04/2022       Reactions   Morphine Other (See Comments)   Stopped heart   Advair Diskus [fluticasone-salmeterol] Other (See Comments)   Sensitivity to smells   Ambien [zolpidem Tartrate] Other (See Comments)   irritable   Amlodipine Besylate Other (See Comments)   Feels bad   Azithromycin Other (See Comments)    Unknown   Bextra [valdecoxib] Other (See Comments)   Stomach issues   Bupropion Other (See Comments)   involuntary muscle movements   Captopril Other (See Comments)   Ache   Irbesartan  Other (See Comments)   dizzy   Latex Itching   Bandaids only   Lisinopril Cough   Pacerone [amiodarone] Other (See Comments)   falls   Pulmicort  [budesonide] Other (See Comments)   Worse breathing, sensitivity to smells   Savella [milnacipran Hcl] Other (See Comments)   Mental fog   Spiriva [tiotropium Bromide Monohydrate] Other (See Comments)   Dry mouth   Suvorexant Other (See Comments)    not effective   Tiotropium Bromide Monohydrate Other (See Comments)    dry mouth        Medication List     STOP taking these medications    fluocinonide 0.05 % external solution Commonly known as: LIDEX   nystatin powder Commonly known as: MYCOSTATIN/NYSTOP       TAKE these medications    albuterol 108 (90 Base) MCG/ACT inhaler Commonly known as: VENTOLIN HFA Inhale 2 puffs into the lungs as needed for shortness of breath.   aspirin 81 MG chewable tablet Chew 81 mg by mouth daily.   Calcium Carbonate-Vitamin D 600-200 MG-UNIT Tabs Take 1 tablet by mouth daily.   Clobetasol Prop Emollient Base 0.05 % emollient cream Apply 1 application topically 2 (two) times daily.   CRANBERRY PO Take 1 tablet by mouth daily.   DULoxetine 60 MG capsule Commonly known as: CYMBALTA Take 60 mg by mouth 2 (two) times daily.   Enbrel 50 MG/ML injection Generic drug: etanercept Inject 50 mg into the skin once a week.   EYE DROPS OP Place 2 drops into both eyes as needed (clear eyes).   FISH OIL PO Take 2 capsules by mouth daily.   fluticasone 50 MCG/ACT nasal spray Commonly known as: FLONASE Place 2 sprays into both nostrils at bedtime.   HYDROcodone-acetaminophen 7.5-325 MG tablet Commonly known as: NORCO Take 1 tablet by mouth every 6 (six) hours as needed for moderate pain or severe pain. Start taking on: May 16, 2022 What changed: when to take this   ibuprofen 200 MG tablet Commonly known as: ADVIL Take 400 mg by mouth as needed for headache or moderate pain.   magnesium oxide 400 (240 Mg) MG tablet Commonly known as: MAG-OX Take 1 tablet (400 mg total) by mouth daily. Start taking on: May 05, 2022    meloxicam 15 MG tablet Commonly known as: MOBIC Take 15 mg by mouth daily.   metoprolol succinate 25 MG 24 hr tablet Commonly known as: TOPROL-XL Take 1 tablet (25 mg total) by mouth 2 (two) times daily. What changed:  how much to take when to take this   MULTI-VITAMIN PO Take 1 tablet by mouth daily.   omeprazole 40 MG capsule Commonly known as: PRILOSEC Take 40 mg by mouth daily.   Osphena 60 MG Tabs Generic drug: Ospemifene Take 60 mg by mouth daily.   rosuvastatin 20 MG tablet Commonly known as: CRESTOR Take 20 mg by mouth once a week.   Stiolto Respimat 2.5-2.5 MCG/ACT Aers Generic drug: Tiotropium Bromide-Olodaterol Inhale 2 puffs into the lungs daily. What changed:  how much to take when to take this   valsartan 160 MG tablet Commonly known as: DIOVAN Take 160 mg by mouth daily.        Allergies  Allergen Reactions   Morphine Other (See Comments)    Stopped heart   Advair Diskus [Fluticasone-Salmeterol] Other (See Comments)    Sensitivity to smells   Ambien [Zolpidem Tartrate] Other (See Comments)    irritable   Amlodipine Besylate Other (  See Comments)    Feels bad   Azithromycin Other (See Comments)     Unknown   Bextra [Valdecoxib] Other (See Comments)    Stomach issues   Bupropion Other (See Comments)    involuntary muscle movements   Captopril Other (See Comments)    Ache   Irbesartan Other (See Comments)    dizzy   Latex Itching    Bandaids only   Lisinopril Cough   Pacerone [Amiodarone] Other (See Comments)    falls   Pulmicort [Budesonide] Other (See Comments)    Worse breathing, sensitivity to smells   Savella [Milnacipran Hcl] Other (See Comments)    Mental fog   Spiriva [Tiotropium Bromide Monohydrate] Other (See Comments)    Dry mouth   Suvorexant Other (See Comments)     not effective   Tiotropium Bromide Monohydrate Other (See Comments)     dry mouth     Consultations: Cardiology/EP   Procedures/Studies: ECHOCARDIOGRAM COMPLETE  Result Date: 05/04/2022    ECHOCARDIOGRAM REPORT   Patient Name:   Kelsey Diaz Date of Exam: 05/04/2022 Medical Rec #:  [id number]        Height:       66.0 in Accession #:    [id number]       Weight:       195.8 lb Date of Birth:  01/30/1946       BSA:          1.982 m Patient Age:    70 years         BP:           119/69 mmHg Patient Gender: F                HR:           59 bpm. Exam Location:  Inpatient Procedure: 2D Echo, Cardiac Doppler and Color Doppler Indications:    Ventricular Tachycardia I47.2  History:        Patient has prior history of Echocardiogram examinations, most                 recent 12/12/2020. COPD, Arrythmias:Tachycardia,                 Signs/Symptoms:Chest Pain and Shortness of Breath; Risk                 Factors:Hypertension and Sleep Apnea.  Sonographer:    Ronny Flurry Referring Phys: [id number] [place] T TU IMPRESSIONS  1. Left ventricular ejection fraction, by estimation, is 55 to 60%. The left ventricle has normal function. The left ventricle has no regional wall motion abnormalities. There is mild asymmetric left ventricular hypertrophy of the basal-septal segment. Left ventricular diastolic parameters are consistent with Grade I diastolic dysfunction (impaired relaxation).  2. Right ventricular systolic function is normal. The right ventricular size is normal.  3. Left atrial size was mildly dilated.  4. The mitral valve is normal in structure. Trivial mitral valve regurgitation. No evidence of mitral stenosis.  5. The aortic valve is tricuspid. There is mild calcification of the aortic valve. Aortic valve regurgitation is not visualized. Aortic valve sclerosis/calcification is present, without any evidence of aortic stenosis. Aortic valve area, by VTI measures  3.20 cm. Aortic valve mean gradient measures 4.0 mmHg. Aortic valve Vmax measures 1.38 m/s.  6. The inferior vena cava is normal  in size with greater than 50% respiratory variability, suggesting right atrial pressure of 3 mmHg. FINDINGS  Left Ventricle: Left ventricular ejection  fraction, by estimation, is 55 to 60%. The left ventricle has normal function. The left ventricle has no regional wall motion abnormalities. The left ventricular internal cavity size was normal in size. There is  mild asymmetric left ventricular hypertrophy of the basal-septal segment. Left ventricular diastolic parameters are consistent with Grade I diastolic dysfunction (impaired relaxation). Right Ventricle: The right ventricular size is normal. No increase in right ventricular wall thickness. Right ventricular systolic function is normal. Left Atrium: Left atrial size was mildly dilated. Right Atrium: Right atrial size was normal in size. Pericardium: There is no evidence of pericardial effusion. Mitral Valve: The mitral valve is normal in structure. Trivial mitral valve regurgitation. No evidence of mitral valve stenosis. Tricuspid Valve: The tricuspid valve is normal in structure. Tricuspid valve regurgitation is trivial. No evidence of tricuspid stenosis. Aortic Valve: The aortic valve is tricuspid. There is mild calcification of the aortic valve. Aortic valve regurgitation is not visualized. Aortic valve sclerosis/calcification is present, without any evidence of aortic stenosis. Aortic valve mean gradient measures 4.0 mmHg. Aortic valve peak gradient measures 7.6 mmHg. Aortic valve area, by VTI measures 3.20 cm. Pulmonic Valve: The pulmonic valve was normal in structure. Pulmonic valve regurgitation is not visualized. No evidence of pulmonic stenosis. Aorta: The aortic root is normal in size and structure. Venous: The inferior vena cava is normal in size with greater than 50% respiratory variability, suggesting right atrial pressure of 3 mmHg. IAS/Shunts: No atrial level shunt detected by color flow Doppler.  LEFT VENTRICLE PLAX 2D LVIDd:         4.60 cm    Diastology LVIDs:         3.20 cm   LV e' medial:    4.35 cm/s LV PW:         1.10 cm   LV E/e' medial:  21.7 LV IVS:        1.30 cm   LV e' lateral:   10.20 cm/s LVOT diam:     2.30 cm   LV E/e' lateral: 9.3 LV SV:         104 LV SV Index:   52 LVOT Area:     4.15 cm  RIGHT VENTRICLE            IVC RV S prime:     8.38 cm/s  IVC diam: 1.40 cm TAPSE (M-mode): 2.1 cm LEFT ATRIUM             Index        RIGHT ATRIUM           Index LA diam:        3.40 cm 1.72 cm/m   RA Area:     13.50 cm LA Vol (A2C):   45.1 ml 22.76 ml/m  RA Volume:   24.10 ml  12.16 ml/m LA Vol (A4C):   50.7 ml 25.58 ml/m LA Biplane Vol: 48.7 ml 24.57 ml/m  AORTIC VALVE AV Area (Vmax):    3.55 cm AV Area (Vmean):   3.43 cm AV Area (VTI):     3.20 cm AV Vmax:           138.00 cm/s AV Vmean:          98.400 cm/s AV VTI:            0.324 m AV Peak Grad:      7.6 mmHg AV Mean Grad:      4.0 mmHg LVOT Vmax:  118.00 cm/s LVOT Vmean:        81.300 cm/s LVOT VTI:          0.249 m LVOT/AV VTI ratio: 0.77  AORTA Ao Root diam: 3.40 cm Ao Asc diam:  3.10 cm MITRAL VALVE MV Area (PHT): 2.95 cm    SHUNTS MV Decel Time: 257 msec    Systemic VTI:  0.25 m MV E velocity: 94.60 cm/s  Systemic Diam: 2.30 cm MV A velocity: 95.70 cm/s MV E/A ratio:  0.99 Glori Bickers MD Electronically signed by Glori Bickers MD Signature Date/Time: 05/04/2022/4:06:02 PM    Final    DG Chest Portable 1 View  Result Date: 05/03/2022 CLINICAL DATA:  Chest pain and palpitations EXAM: PORTABLE CHEST 1 VIEW COMPARISON:  Chest radiograph dated 04/04/2021 FINDINGS: Normal lung volumes. No focal consolidations. No pleural effusion or pneumothorax. The heart size and mediastinal contours are within normal limits. The visualized skeletal structures are unremarkable. Aortic atherosclerosis. IMPRESSION: No active disease. Electronically Signed   By: Darrin Nipper M.D.   On: 05/03/2022 18:37   DG PAIN CLINIC C-ARM 1-60 MIN NO REPORT  Result Date: 04/06/2022 Fluoro was  used, but no Radiologist interpretation will be provided. Please refer to "NOTES" tab for provider progress note.    Subjective: No acute issues or events overnight   Discharge Exam: Vitals:   05/04/22 1052 05/04/22 1540  BP: 119/69 115/73  Pulse: 65 60  Resp: 19 16  Temp: 97.6 F (36.4 C) 97.7 F (36.5 C)  SpO2: 94% 98%   Vitals:   05/04/22 0544 05/04/22 0728 05/04/22 1052 05/04/22 1540  BP: 138/73 127/73 119/69 115/73  Pulse: 60 77 65 60  Resp: '12 20 19 16  '$ Temp: 98.1 F (36.7 C) 98.1 F (36.7 C) 97.6 F (36.4 C) 97.7 F (36.5 C)  TempSrc: Oral Oral Oral Oral  SpO2: 97% 93% 94% 98%  Weight: 88.8 kg     Height: '5\' 6"'$  (1.676 m)       General: Pt is alert, awake, not in acute distress Cardiovascular: RRR, S1/S2 +, no rubs, no gallops Respiratory: CTA bilaterally, no wheezing, no rhonchi Abdominal: Soft, NT, ND, bowel sounds + Extremities: no edema, no cyanosis    The results of significant diagnostics from this hospitalization (including imaging, microbiology, ancillary and laboratory) are listed below for reference.     Microbiology: Recent Results (from the past 240 hour(s))  MRSA Next Gen by PCR, Nasal     Status: None   Collection Time: 05/04/22  5:45 AM   Specimen: Nasal Mucosa; Nasal Swab  Result Value Ref Range Status   MRSA by PCR Next Gen NOT DETECTED NOT DETECTED Final    Comment: (NOTE) The GeneXpert MRSA Assay (FDA approved for NASAL specimens only), is one component of a comprehensive MRSA colonization surveillance program. It is not intended to diagnose MRSA infection nor to guide or monitor treatment for MRSA infections. Test performance is not FDA approved in patients less than 33 years old. Performed at [facility], [place] [address], [place], [place] [address]      Labs: BNP (last 3 results) No results for input(s): "BNP" in the last 8760 hours. Basic Metabolic Panel: Recent Labs  Lab 05/03/22 1815 05/04/22 0434  NA 137 136   K 4.2 4.1  CL 106 103  CO2 20* 26  GLUCOSE 94 150*  BUN 15 16  CREATININE 0.80 0.83  CALCIUM 8.4* 8.7*  MG 1.6*  --    Liver  Function Tests: No results for input(s): "AST", "ALT", "ALKPHOS", "BILITOT", "PROT", "ALBUMIN" in the last 168 hours. No results for input(s): "LIPASE", "AMYLASE" in the last 168 hours. No results for input(s): "AMMONIA" in the last 168 hours. CBC: Recent Labs  Lab 05/03/22 1815 05/04/22 0434  WBC 4.2 5.3  NEUTROABS 2.0  --   HGB 8.7* 13.4  HCT 27.1* 40.5  MCV 95.8 92.5  PLT 113* 174   Cardiac Enzymes: No results for input(s): "CKTOTAL", "CKMB", "CKMBINDEX", "TROPONINI" in the last 168 hours. BNP: Invalid input(s): "POCBNP" CBG: No results for input(s): "GLUCAP" in the last 168 hours. D-Dimer No results for input(s): "DDIMER" in the last 72 hours. Hgb A1c No results for input(s): "HGBA1C" in the last 72 hours. Lipid Profile No results for input(s): "CHOL", "HDL", "LDLCALC", "TRIG", "CHOLHDL", "LDLDIRECT" in the last 72 hours. Thyroid function studies Recent Labs    05/04/22 0434  TSH 0.936   Anemia work up Recent Labs    05/04/22 0434  VITAMINB12 670  FOLATE >40.0  FERRITIN 14  TIBC 452*  IRON 135   Urinalysis    Component Value Date/Time   COLORURINE YELLOW 05/03/2022 2047   APPEARANCEUR CLOUDY (A) 05/03/2022 2047   LABSPEC 1.010 05/03/2022 2047   PHURINE 6.0 05/03/2022 2047   GLUCOSEU NEGATIVE 05/03/2022 2047   HGBUR NEGATIVE 05/03/2022 2047   BILIRUBINUR NEGATIVE 05/03/2022 2047   KETONESUR NEGATIVE 05/03/2022 2047   PROTEINUR NEGATIVE 05/03/2022 2047   UROBILINOGEN 0.2 08/12/2008 2131   NITRITE NEGATIVE 05/03/2022 2047   LEUKOCYTESUR NEGATIVE 05/03/2022 2047   Sepsis Labs Recent Labs  Lab 05/03/22 1815 05/04/22 0434  WBC 4.2 5.3   Microbiology Recent Results (from the past 240 hour(s))  MRSA Next Gen by PCR, Nasal     Status: None   Collection Time: 05/04/22  5:45 AM   Specimen: Nasal Mucosa; Nasal Swab   Result Value Ref Range Status   MRSA by PCR Next Gen NOT DETECTED NOT DETECTED Final    Comment: (NOTE) The GeneXpert MRSA Assay (FDA approved for NASAL specimens only), is one component of a comprehensive MRSA colonization surveillance program. It is not intended to diagnose MRSA infection nor to guide or monitor treatment for MRSA infections. Test performance is not FDA approved in patients less than 1 years old. Performed at [facility], [place] [address] [place], [place] [address]      Time coordinating discharge: Over 30 minutes  SIGNED:   Little Ishikawa, DO [facility] 05/04/2022, 4:19 PM Pager   If 7PM-7AM, please contact night-coverage [email or web address]

## 2022-05-04 NOTE — ED Notes (Signed)
ED TO INPATIENT HANDOFF REPORT  ED Nurse Name and Phone #:  [phone]  S Name/Age/Gender Brand Males 77 y.o. female Room/Bed: 021C/021C  Code Status   Code Status: Full Code  Home/SNF/Other Home Patient oriented to: self, place, time, and situation Is this baseline? Yes   Triage Complete: Triage complete  Chief Complaint Ventricular tachycardia ([place]) [I47.20]  Triage Note Pt arrived via ems for chest pain that started when pt awoke and palpitations pt reports that this has happened before Pt reports dizziness and near syncope   Allergies Allergies  Allergen Reactions   Morphine Other (See Comments)    Stopped heart   Advair Diskus [Fluticasone-Salmeterol]     Sensitivity to smells   Ambien [Zolpidem Tartrate]     irritable   Amlodipine Besylate     Feels bad   Azithromycin     Other reaction(s): Unknown   Bextra [Valdecoxib]     Stomach issues   Bupropion     Other reaction(s): involuntary muscle movements   Captopril     Ache   Irbesartan     Other reaction(s): dizzy   Latex Itching    Bandaids only   Lisinopril     cough   Pacerone [Amiodarone] Other (See Comments)    falls   Pulmicort [Budesonide]     Worse breathing, sensitivity to smells   Savella [Milnacipran Hcl]     Mental fog   Spiriva [Tiotropium Bromide Monohydrate]     Dry mouth   Suvorexant     Other reaction(s): not effective   Tiotropium Bromide Monohydrate     Other reaction(s): dry mouth    Level of Care/Admitting Diagnosis ED Disposition     ED Disposition  Admit   Condition  --   [place]: [place] [[id number]]  Level of Care: Progressive [102]  Admit to Progressive based on following criteria: CARDIOVASCULAR & THORACIC of moderate stability with acute coronary syndrome symptoms/low risk myocardial infarction/hypertensive urgency/arrhythmias/heart failure potentially compromising stability and stable post cardiovascular intervention  patients.  May place patient in observation at [facility] or [place] if equivalent level of care is available:: No  Covid Evaluation: Asymptomatic - no recent exposure (last 10 days) testing not required  Diagnosis: Ventricular tachycardia [facility]) [id number]  Admitting Physician: Orene Desanctis [id number]  Attending Physician: Orene Desanctis [id number]          B Medical/Surgery History Past Medical History:  Diagnosis Date   Allergic rhinitis    Anxiety    Asthma    Bilateral hip pain 11/28/2018   Colitis    COPD (chronic obstructive pulmonary disease) ([place])    Fibromyalgia 05/16/2019   GERD (gastroesophageal reflux disease)    With esophageal strictures   Hypertension    Hyperthyroidism    OSA (obstructive sleep apnea) 02/09/2012   No mask.  Did not tolerate   Osteoarthritis    Rheumatoid arthritis ([place]) 10/03/2018   Past Surgical History:  Procedure Laterality Date   BREAST BIOPSY     x2   CATARACT EXTRACTION W/PHACO Left 09/27/2018   Procedure: CATARACT EXTRACTION PHACO AND INTRAOCULAR LENS PLACEMENT ([place]) LEFT;  Surgeon: Birder Robson, MD;  Location: [place];  Service: Ophthalmology;  Laterality: Left;   CATARACT EXTRACTION W/PHACO Right 11/29/2018   Procedure: CATARACT EXTRACTION PHACO AND INTRAOCULAR LENS PLACEMENT ([place]) RIGHT;  Surgeon: Birder Robson, MD;  Location: [place];  Service: Ophthalmology;  Laterality: Right;  1:22 20.1% 16.64  CHOLECYSTECTOMY     2005   KNEE SURGERY Right 2013   two torn ligaments repaired.    LEFT HEART CATH AND CORONARY ANGIOGRAPHY N/A 06/08/2017   Procedure: LEFT HEART CATH AND CORONARY ANGIOGRAPHY;  Surgeon: Troy Sine, MD;  Location: [place] [facility];  Service: Cardiovascular;  Laterality: N/A;   LEFT HEART CATH AND CORONARY ANGIOGRAPHY N/A 12/12/2020   Procedure: LEFT HEART CATH AND CORONARY ANGIOGRAPHY;  Surgeon: Lorretta Harp, MD;  Location: [place] [facility];  Service: Cardiovascular;   Laterality: N/A;   NASAL SINUS SURGERY     V TACH ABLATION N/A 12/16/2020   Procedure: V TACH ABLATION;  Surgeon: Vickie Epley, MD;  Location: [place] [facility];  Service: Cardiovascular;  Laterality: N/A;   VAGINAL HYSTERECTOMY       A IV Location/Drains/Wounds Patient Lines/Drains/Airways Status     Active Line/Drains/Airways     Name Placement date Placement time Site Days   Peripheral IV 05/03/22 20 G 1" Left;Posterior Forearm 05/03/22  1905  Forearm  1   Epidural Catheter 04/06/22 04/06/22  1300  -- 28            Intake/Output Last 24 hours  Intake/Output Summary (Last 24 hours) at 05/04/2022 0503 Last data filed at 05/03/2022 2106 Gross per 24 hour  Intake 850 ml  Output --  Net 850 ml    Labs/Imaging Results for orders placed or performed during the hospital encounter of 05/03/22 (from the past 48 hour(s))  CBC with Differential     Status: Abnormal   Collection Time: 05/03/22  6:15 PM  Result Value Ref Range   WBC 4.2 4.0 - 10.5 K/uL   RBC 2.83 (L) 3.87 - 5.11 MIL/uL   Hemoglobin 8.7 (L) 12.0 - 15.0 g/dL   HCT 27.1 (L) 36.0 - 46.0 %   MCV 95.8 80.0 - 100.0 fL   MCH 30.7 26.0 - 34.0 pg   MCHC 32.1 30.0 - 36.0 g/dL   RDW 13.5 11.5 - 15.5 %   Platelets 113 (L) 150 - 400 K/uL   nRBC 0.0 0.0 - 0.2 %   Neutrophils Relative % 47 %   Neutro Abs 2.0 1.7 - 7.7 K/uL   Lymphocytes Relative 38 %   Lymphs Abs 1.6 0.7 - 4.0 K/uL   Monocytes Relative 11 %   Monocytes Absolute 0.5 0.1 - 1.0 K/uL   Eosinophils Relative 3 %   Eosinophils Absolute 0.1 0.0 - 0.5 K/uL   Basophils Relative 1 %   Basophils Absolute 0.0 0.0 - 0.1 K/uL   Immature Granulocytes 0 %   Abs Immature Granulocytes 0.01 0.00 - 0.07 K/uL    Comment: Performed at [facility], [address], [place], [place] [id number]  Basic metabolic panel     Status: Abnormal   Collection Time: 05/03/22  6:15 PM  Result Value Ref Range   Sodium 137 135 - 145 mmol/L   Potassium 4.2 3.5 - 5.1 mmol/L    Chloride 106 98 - 111 mmol/L   CO2 20 (L) 22 - 32 mmol/L   Glucose, Bld 94 70 - 99 mg/dL    Comment: Glucose reference range applies only to samples taken after fasting for at least 8 hours.   BUN 15 8 - 23 mg/dL   Creatinine, Ser 0.80 0.44 - 1.00 mg/dL   Calcium 8.4 (L) 8.9 - 10.3 mg/dL   GFR, Estimated >60 >60 mL/min    Comment: (NOTE) Calculated using  the CKD-EPI Creatinine Equation (2021)    Anion gap 11 5 - 15    Comment: Performed at [facility], [place] [address], [place], [place] [address]  Magnesium     Status: Abnormal   Collection Time: 05/03/22  6:15 PM  Result Value Ref Range   Magnesium 1.6 (L) 1.7 - 2.4 mg/dL    Comment: Performed at [facility] [address] [place], [place] [address]  Troponin I (High Sensitivity)     Status: None   Collection Time: 05/03/22  6:15 PM  Result Value Ref Range   Troponin I (High Sensitivity) 14 <18 ng/L    Comment: (NOTE) Elevated high sensitivity troponin I (hsTnI) values and significant  changes across serial measurements may suggest ACS but many other  chronic and acute conditions are known to elevate hsTnI results.  Refer to the "Links" section for chest pain algorithms and additional  guidance. Performed at [facility], [place] [address] [place], [place] [address]   Troponin I (High Sensitivity)     Status: None   Collection Time: 05/03/22  8:05 PM  Result Value Ref Range   Troponin I (High Sensitivity) 15 <18 ng/L    Comment: (NOTE) Elevated high sensitivity troponin I (hsTnI) values and significant  changes across serial measurements may suggest ACS but many other  chronic and acute conditions are known to elevate hsTnI results.  Refer to the "Links" section for chest pain algorithms and additional  guidance. Performed at [facility], [place] [address] [place], [place] [address]   Urinalysis, Routine w reflex microscopic -Urine, Clean Catch     Status: Abnormal   Collection Time: 05/03/22   8:47 PM  Result Value Ref Range   Color, Urine YELLOW YELLOW   APPearance CLOUDY (A) CLEAR   Specific Gravity, Urine 1.010 1.005 - 1.030   pH 6.0 5.0 - 8.0   Glucose, UA NEGATIVE NEGATIVE mg/dL   Hgb urine dipstick NEGATIVE NEGATIVE   Bilirubin Urine NEGATIVE NEGATIVE   Ketones, ur NEGATIVE NEGATIVE mg/dL   Protein, ur NEGATIVE NEGATIVE mg/dL   Nitrite NEGATIVE NEGATIVE   Leukocytes,Ua NEGATIVE NEGATIVE    Comment: Performed at [facility] [address] [place], [place] [address]  POC occult blood, ED     Status: None   Collection Time: 05/03/22  9:17 PM  Result Value Ref Range   Fecal Occult Bld NEGATIVE NEGATIVE   DG Chest Portable 1 View  Result Date: 05/03/2022 CLINICAL DATA:  Chest pain and palpitations EXAM: PORTABLE CHEST 1 VIEW COMPARISON:  Chest radiograph dated 04/04/2021 FINDINGS: Normal lung volumes. No focal consolidations. No pleural effusion or pneumothorax. The heart size and mediastinal contours are within normal limits. The visualized skeletal structures are unremarkable. Aortic atherosclerosis. IMPRESSION: No active disease. Electronically Signed   By: Darrin Nipper M.D.   On: 05/03/2022 18:37    Pending Labs Unresulted Labs (From admission, onward)     Start     Ordered   05/04/22 0500  TSH  Tomorrow morning,   R        05/03/22 2331   05/04/22 0500  CBC  Tomorrow morning,   R        05/03/22 2331   05/04/22 [id number]  Basic metabolic panel  Tomorrow morning,   R        05/03/22 2331   05/04/22 0500  Iron and TIBC  Tomorrow morning,   R  05/03/22 2331   05/04/22 0500  Ferritin  Tomorrow morning,   R        05/03/22 2331   05/04/22 0500  Vitamin B12  Tomorrow morning,   R        05/03/22 2331   05/04/22 0500  Folate  Tomorrow morning,   R        05/03/22 2331            Vitals/Pain Today's Vitals   05/04/22 0415 05/04/22 0430 05/04/22 0445 05/04/22 0500  BP: 115/89 119/74 134/66 128/68  Pulse: 69 66 63 65  Resp: 14 19 (!) 22 (!) 21   Temp:      TempSrc:      SpO2: 100% 100% 100% 100%  Weight:      Height:      PainSc:        Isolation Precautions No active isolations  Medications Medications  metoprolol succinate (TOPROL-XL) 24 hr tablet 12.5 mg (12.5 mg Oral Given 05/04/22 0002)  enoxaparin (LOVENOX) injection 40 mg (has no administration in time range)  aspirin chewable tablet 81 mg (has no administration in time range)  HYDROcodone-acetaminophen (NORCO/VICODIN) 5-325 MG per tablet 1.5 tablet (1.5 tablets Oral Given 05/04/22 0414)  meloxicam (MOBIC) tablet 15 mg (has no administration in time range)  DULoxetine (CYMBALTA) DR capsule 60 mg (60 mg Oral Given 05/04/22 0000)  Ospemifene TABS 60 mg (has no administration in time range)  pantoprazole (PROTONIX) EC tablet 40 mg (has no administration in time range)  arformoterol (BROVANA) nebulizer solution 15 mcg (15 mcg Nebulization Given 05/04/22 0000)    And  umeclidinium bromide (INCRUSE ELLIPTA) 62.5 MCG/ACT 1 puff (has no administration in time range)  irbesartan (AVAPRO) tablet 150 mg (has no administration in time range)  magnesium sulfate IVPB 2 g 50 mL (0 g Intravenous Stopped 05/03/22 2106)  pantoprazole (PROTONIX) injection 40 mg (40 mg Intravenous Given 05/03/22 2143)    Mobility walks     Focused Assessments Cardiac Assessment Handoff:    No results found for: "CKTOTAL", "CKMB", "CKMBINDEX", "TROPONINI" No results found for: "DDIMER" Does the Patient currently have chest pain? No    R Recommendations: See Admitting Provider Note  Report given to:   Additional Notes:   Please call with any questions

## 2022-05-05 NOTE — Progress Notes (Signed)
  Cardiology Office Note:   Date:  05/13/2022  ID:  Kelsey Diaz, DOB 29-Feb-1945, MRN [id number]  Primary Cardiologist: Minus Breeding, MD Electrophysiologist: Vickie Epley, MD   History of Present Illness:   Kelsey Diaz is a 77 y.o. female with a history of RA, FM, Non-obstructive CAD, OSA on CPAP, HTN, and NSVT/VT  seen today for post hospital follow up.    Admitted 3/10 - 3/11 with near syncope. Found to have runs of NSVT on telemetry. Work up was unrevealing other than Mg of 1.6 which was supped, felt to potentially be due to laxative/fiber use.  BB increased.   Since discharge from hospital the patient reports doing OK, but over the past couple of days has had intermittent dizziness, lightheadedness and even near syncope.  She is markedly orthostatic in office today. Took medications this am and had a [place].  Denies frank syncope, but has had a fall. No chest pain or edema.   Review of systems complete and found to be negative unless listed in HPI.   AAD History On amiodarone 10/22 - 12/22 -> stopped with neuropathy/dizziness/shaking/forgetfulness.   Studies Reviewed:    EKGs in ED reviewed which show NSR at 82 bpm. A second EKG appears to show a brief run of SVT.   Risk Assessment/Calculations:       Physical Exam:   VS:  Ht 5\' 6"  (1.676 m)   Wt 195 lb (88.5 kg)   SpO2 96%   BMI 31.47 kg/m    Wt Readings from Last 3 Encounters:  05/13/22 195 lb (88.5 kg)  05/04/22 195 lb 12.3 oz (88.8 kg)  04/07/22 197 lb (89.4 kg)    Orthostatic VS for the past 72 hrs (Last 3 readings):  Orthostatic BP Patient Position Orthostatic Pulse  05/13/22 1025 (!) 74/44 -- 80  05/13/22 1024 (!) 80/50 -- 72  05/13/22 1023 (!) 88/58 Sitting 64  05/13/22 1022 110/70 Supine 68  05/13/22 1007 92/66 -- 72   GEN: Well nourished, well developed in no acute distress NECK: No JVD; No carotid bruits CARDIAC: Regularly irregular, due to bigeminy, no murmurs, rubs, gallops RESPIRATORY:   Clear to auscultation without rales, wheezing or rhonchi  ABDOMEN: Soft, non-tender, non-distended EXTREMITIES:  No edema; No deformity   ASSESSMENT AND PLAN:   Ventricular tachycardia/ NSVT Recent episodes her first (symptomatic) episodes since coming off amiodarone 01/2021 Echo 05/04/2022 LVEF 55-60%, Grade 1 DD, mild LAE, trivial MV cMRI 11/2020 EF 53%, mild mid myocardial gad uptake slightly worse in basal septum, non-specific.  Not inducible with prior EP study 11/2020 Continue toprol 25 mg BID Consider mexitil if recurs. I worry about dizziness.  Continue Mag ox 400 mg daily.  Labs today Will place 7 day Zio to further quantify.  She had neurologic side effects on amiodarone, and is a very poor candidate for re challenging.    H/o amiodarone associated neuropathy Not a candidate to be re-challenged.   Chronic fatigue Multifactorial  Orthostatics Stop Valsartan Unfortunately further limits her already difficult to navigate medication management.  With bigeminy on exam, would not adjust Toprol at this time.   Follow up with EP APP in 2 weeks  Signed, Shirley Friar, PA-C

## 2022-05-06 ENCOUNTER — Encounter: Payer: Self-pay | Admitting: Cardiology

## 2022-05-06 MED ORDER — METOPROLOL SUCCINATE ER 25 MG PO TB24: 25.0000 mg | ORAL_TABLET | Freq: Every evening | ORAL | 3 refills | Status: DC

## 2022-05-07 ENCOUNTER — Telehealth: Payer: Self-pay

## 2022-05-07 NOTE — Telephone Encounter (Signed)
Called patient back regarding her low blood pressures. Patient now endorses feeling guilty. Patient was recently in the ED for anemia and VT, she states that when she came home from the ED she was still having dizziness and sustained a fall. She states she has only taken her valsartan for BP today and her BP is 83/62, HR 83. Patient acknowledges she has not had much to eat or drink today. She denies SOB but states her dizziness does not seem to be relenting. She denies leg swelling, I had her check her BP again, BP 90/65 and HR 83, still endorsing dizziness. I advised patient to call EMS at this time as her last BP when she was discharged from ED was 115/73 and HR was 60. When she was admitted she was also having runs of VT. Patient verbalizes understanding and agrees to plan.

## 2022-05-11 DIAGNOSIS — E1169 Type 2 diabetes mellitus with other specified complication: Secondary | ICD-10-CM | POA: Diagnosis not present

## 2022-05-11 DIAGNOSIS — I10 Essential (primary) hypertension: Secondary | ICD-10-CM | POA: Diagnosis not present

## 2022-05-11 DIAGNOSIS — J449 Chronic obstructive pulmonary disease, unspecified: Secondary | ICD-10-CM | POA: Diagnosis not present

## 2022-05-11 DIAGNOSIS — E785 Hyperlipidemia, unspecified: Secondary | ICD-10-CM | POA: Diagnosis not present

## 2022-05-13 ENCOUNTER — Encounter: Payer: Self-pay | Admitting: Student

## 2022-05-13 ENCOUNTER — Ambulatory Visit: Payer: Medicare HMO | Attending: Student | Admitting: Student

## 2022-05-13 ENCOUNTER — Other Ambulatory Visit: Payer: Self-pay | Admitting: Student

## 2022-05-13 ENCOUNTER — Telehealth: Payer: Self-pay | Admitting: Cardiology

## 2022-05-13 ENCOUNTER — Encounter (HOSPITAL_COMMUNITY): Payer: Self-pay

## 2022-05-13 ENCOUNTER — Inpatient Hospital Stay (HOSPITAL_COMMUNITY)
Admission: EM | Admit: 2022-05-13 | Discharge: 2022-05-16 | DRG: 310 | Disposition: A | Discharge status: Home Health | Payer: Medicare HMO | Attending: Cardiology | Admitting: Cardiology

## 2022-05-13 ENCOUNTER — Other Ambulatory Visit: Payer: Self-pay

## 2022-05-13 ENCOUNTER — Other Ambulatory Visit (INDEPENDENT_AMBULATORY_CARE_PROVIDER_SITE_OTHER): Payer: Medicare HMO

## 2022-05-13 ENCOUNTER — Telehealth: Payer: Self-pay | Admitting: Physician Assistant

## 2022-05-13 VITALS — Ht 66.0 in | Wt 195.0 lb

## 2022-05-13 DIAGNOSIS — Z8051 Family history of malignant neoplasm of kidney: Secondary | ICD-10-CM | POA: Diagnosis not present

## 2022-05-13 DIAGNOSIS — Z791 Long term (current) use of non-steroidal anti-inflammatories (NSAID): Secondary | ICD-10-CM

## 2022-05-13 DIAGNOSIS — J4489 Other specified chronic obstructive pulmonary disease: Secondary | ICD-10-CM | POA: Diagnosis present

## 2022-05-13 DIAGNOSIS — Z7982 Long term (current) use of aspirin: Secondary | ICD-10-CM | POA: Diagnosis not present

## 2022-05-13 DIAGNOSIS — Z87891 Personal history of nicotine dependence: Secondary | ICD-10-CM | POA: Diagnosis not present

## 2022-05-13 DIAGNOSIS — G4733 Obstructive sleep apnea (adult) (pediatric): Secondary | ICD-10-CM | POA: Diagnosis present

## 2022-05-13 DIAGNOSIS — E785 Hyperlipidemia, unspecified: Secondary | ICD-10-CM | POA: Diagnosis present

## 2022-05-13 DIAGNOSIS — I472 Ventricular tachycardia, unspecified: Secondary | ICD-10-CM | POA: Diagnosis not present

## 2022-05-13 DIAGNOSIS — I959 Hypotension, unspecified: Secondary | ICD-10-CM | POA: Diagnosis present

## 2022-05-13 DIAGNOSIS — M069 Rheumatoid arthritis, unspecified: Secondary | ICD-10-CM | POA: Diagnosis present

## 2022-05-13 DIAGNOSIS — R55 Syncope and collapse: Secondary | ICD-10-CM

## 2022-05-13 DIAGNOSIS — R5383 Other fatigue: Secondary | ICD-10-CM

## 2022-05-13 DIAGNOSIS — M797 Fibromyalgia: Secondary | ICD-10-CM | POA: Diagnosis present

## 2022-05-13 DIAGNOSIS — Z881 Allergy status to other antibiotic agents status: Secondary | ICD-10-CM | POA: Diagnosis not present

## 2022-05-13 DIAGNOSIS — Z833 Family history of diabetes mellitus: Secondary | ICD-10-CM

## 2022-05-13 DIAGNOSIS — Z888 Allergy status to other drugs, medicaments and biological substances status: Secondary | ICD-10-CM

## 2022-05-13 DIAGNOSIS — Z8249 Family history of ischemic heart disease and other diseases of the circulatory system: Secondary | ICD-10-CM | POA: Diagnosis not present

## 2022-05-13 DIAGNOSIS — K219 Gastro-esophageal reflux disease without esophagitis: Secondary | ICD-10-CM | POA: Diagnosis present

## 2022-05-13 DIAGNOSIS — I2510 Atherosclerotic heart disease of native coronary artery without angina pectoris: Secondary | ICD-10-CM | POA: Diagnosis present

## 2022-05-13 DIAGNOSIS — R002 Palpitations: Secondary | ICD-10-CM

## 2022-05-13 DIAGNOSIS — I4729 Other ventricular tachycardia: Secondary | ICD-10-CM | POA: Diagnosis not present

## 2022-05-13 DIAGNOSIS — Z825 Family history of asthma and other chronic lower respiratory diseases: Secondary | ICD-10-CM

## 2022-05-13 DIAGNOSIS — Z91040 Latex allergy status: Secondary | ICD-10-CM

## 2022-05-13 DIAGNOSIS — F32A Depression, unspecified: Secondary | ICD-10-CM | POA: Diagnosis present

## 2022-05-13 DIAGNOSIS — E0590 Thyrotoxicosis, unspecified without thyrotoxic crisis or storm: Secondary | ICD-10-CM | POA: Diagnosis present

## 2022-05-13 DIAGNOSIS — Z79899 Other long term (current) drug therapy: Secondary | ICD-10-CM

## 2022-05-13 DIAGNOSIS — I10 Essential (primary) hypertension: Secondary | ICD-10-CM | POA: Diagnosis not present

## 2022-05-13 DIAGNOSIS — Z820 Family history of epilepsy and other diseases of the nervous system: Secondary | ICD-10-CM

## 2022-05-13 DIAGNOSIS — Z91048 Other nonmedicinal substance allergy status: Secondary | ICD-10-CM

## 2022-05-13 DIAGNOSIS — Z885 Allergy status to narcotic agent status: Secondary | ICD-10-CM | POA: Diagnosis not present

## 2022-05-13 LAB — COMPREHENSIVE METABOLIC PANEL
ALT: 19 U/L (ref 0–44)
AST: 30 U/L (ref 15–41)
Albumin: 3.7 g/dL (ref 3.5–5.0)
Alkaline Phosphatase: 56 U/L (ref 38–126)
Anion gap: 13 (ref 5–15)
BUN: 19 mg/dL (ref 8–23)
CO2: 24 mmol/L (ref 22–32)
Calcium: 9.4 mg/dL (ref 8.9–10.3)
Chloride: 97 mmol/L — ABNORMAL LOW (ref 98–111)
Creatinine, Ser: 0.84 mg/dL (ref 0.44–1.00)
GFR, Estimated: >60 mL/min (ref >60)
Glucose, Bld: 92 mg/dL (ref 70–99)
Potassium: 4.2 mmol/L (ref 3.5–5.1)
Sodium: 134 mmol/L — ABNORMAL LOW (ref 135–145)
Total Bilirubin: 0.8 mg/dL (ref 0.3–1.2)
Total Protein: 6.9 g/dL (ref 6.5–8.1)

## 2022-05-13 LAB — CBC WITH DIFFERENTIAL/PLATELET
Abs Immature Granulocytes: 0.01 10*3/uL (ref 0.00–0.07)
Basophils Absolute: 0.1 10*3/uL (ref 0.0–0.1)
Basophils Relative: 1 %
Eosinophils Absolute: 0.2 10*3/uL (ref 0.0–0.5)
Eosinophils Relative: 3 %
HCT: 43.9 % (ref 36.0–46.0)
Hemoglobin: 14.3 g/dL (ref 12.0–15.0)
Immature Granulocytes: 0 %
Lymphocytes Relative: 43 %
Lymphs Abs: 3 10*3/uL (ref 0.7–4.0)
MCH: 30.4 pg (ref 26.0–34.0)
MCHC: 32.6 g/dL (ref 30.0–36.0)
MCV: 93.2 fL (ref 80.0–100.0)
Monocytes Absolute: 0.7 10*3/uL (ref 0.1–1.0)
Monocytes Relative: 11 %
Neutro Abs: 2.9 10*3/uL (ref 1.7–7.7)
Neutrophils Relative %: 42 %
Platelets: 187 10*3/uL (ref 150–400)
RBC: 4.71 MIL/uL (ref 3.87–5.11)
RDW: 13.6 % (ref 11.5–15.5)
WBC: 6.9 10*3/uL (ref 4.0–10.5)
nRBC: 0 % (ref 0.0–0.2)

## 2022-05-13 LAB — TROPONIN I (HIGH SENSITIVITY)
Troponin I (High Sensitivity): 6 ng/L (ref <18)
Troponin I (High Sensitivity): 6 ng/L (ref <18)

## 2022-05-13 LAB — MAGNESIUM: Magnesium: 1.9 mg/dL (ref 1.7–2.4)

## 2022-05-13 MED ORDER — FLUTICASONE PROPIONATE 50 MCG/ACT NA SUSP
2.0000 | Freq: Every day | NASAL | Status: DC
  Administered 2022-05-14 – 2022-05-15 (×3): 2 via NASAL
  Filled 2022-05-13: qty 16

## 2022-05-13 MED ORDER — ASPIRIN 81 MG PO CHEW: 81.0000 mg | CHEWABLE_TABLET | Freq: Every day | ORAL | Status: DC

## 2022-05-13 MED ORDER — ALBUTEROL SULFATE HFA 108 (90 BASE) MCG/ACT IN AERS: 2.0000 | INHALATION_SPRAY | RESPIRATORY_TRACT | Status: DC | PRN

## 2022-05-13 MED ORDER — MAGNESIUM SULFATE 2 GM/50ML IV SOLN
2.0000 g | Freq: Once | INTRAVENOUS | Status: AC
  Administered 2022-05-13: 2 g via INTRAVENOUS
  Filled 2022-05-13: qty 50

## 2022-05-13 MED ORDER — METOPROLOL SUCCINATE ER 50 MG PO TB24
50.0000 mg | ORAL_TABLET | Freq: Every evening | ORAL | Status: DC
  Administered 2022-05-13: 50 mg via ORAL
  Filled 2022-05-13: qty 1

## 2022-05-13 MED ORDER — PANTOPRAZOLE SODIUM 40 MG PO TBEC
40.0000 mg | DELAYED_RELEASE_TABLET | Freq: Every day | ORAL | Status: DC
  Administered 2022-05-14 – 2022-05-16 (×3): 40 mg via ORAL
  Filled 2022-05-13 (×3): qty 1

## 2022-05-13 MED ORDER — ROSUVASTATIN CALCIUM 20 MG PO TABS: 20.0000 mg | ORAL_TABLET | ORAL | Status: DC

## 2022-05-13 MED ORDER — OSPEMIFENE 60 MG PO TABS: 60.0000 mg | ORAL_TABLET | Freq: Every day | ORAL | Status: DC

## 2022-05-13 MED ORDER — ONDANSETRON HCL 4 MG/2ML IJ SOLN: 4.0000 mg | Freq: Four times a day (QID) | INTRAMUSCULAR | Status: DC | PRN

## 2022-05-13 MED ORDER — HEPARIN SODIUM (PORCINE) 5000 UNIT/ML IJ SOLN
5000.0000 [IU] | Freq: Three times a day (TID) | INTRAMUSCULAR | Status: DC
  Administered 2022-05-13 – 2022-05-16 (×8): 5000 [IU] via SUBCUTANEOUS
  Filled 2022-05-13 (×8): qty 1

## 2022-05-13 MED ORDER — ROSUVASTATIN CALCIUM 20 MG PO TABS
20.0000 mg | ORAL_TABLET | ORAL | Status: DC
  Administered 2022-05-13: 20 mg via ORAL
  Filled 2022-05-13: qty 1

## 2022-05-13 MED ORDER — UMECLIDINIUM BROMIDE 62.5 MCG/ACT IN AEPB
1.0000 | INHALATION_SPRAY | Freq: Every day | RESPIRATORY_TRACT | Status: DC
  Administered 2022-05-14 – 2022-05-16 (×3): 1 via RESPIRATORY_TRACT
  Filled 2022-05-13: qty 7

## 2022-05-13 MED ORDER — ACETAMINOPHEN 325 MG PO TABS
650.0000 mg | ORAL_TABLET | ORAL | Status: DC | PRN
  Administered 2022-05-14 – 2022-05-16 (×2): 650 mg via ORAL
  Filled 2022-05-13 (×2): qty 2

## 2022-05-13 MED ORDER — SODIUM CHLORIDE 0.9 % IV SOLN: Freq: Once | INTRAVENOUS | Status: AC

## 2022-05-13 MED ORDER — ALBUTEROL SULFATE (2.5 MG/3ML) 0.083% IN NEBU: 2.5000 mg | INHALATION_SOLUTION | Freq: Four times a day (QID) | RESPIRATORY_TRACT | Status: DC | PRN

## 2022-05-13 MED ORDER — METOPROLOL SUCCINATE ER 25 MG PO TB24: 50.0000 mg | ORAL_TABLET | Freq: Every evening | ORAL | Status: DC

## 2022-05-13 MED ORDER — ARFORMOTEROL TARTRATE 15 MCG/2ML IN NEBU
15.0000 ug | INHALATION_SOLUTION | Freq: Two times a day (BID) | RESPIRATORY_TRACT | Status: DC
  Administered 2022-05-14 – 2022-05-15 (×4): 15 ug via RESPIRATORY_TRACT
  Filled 2022-05-13 (×6): qty 2

## 2022-05-13 MED ORDER — PANTOPRAZOLE SODIUM 40 MG PO TBEC: 40.0000 mg | DELAYED_RELEASE_TABLET | Freq: Every day | ORAL | Status: DC

## 2022-05-13 MED ORDER — ASPIRIN 81 MG PO CHEW
81.0000 mg | CHEWABLE_TABLET | Freq: Every day | ORAL | Status: DC
  Administered 2022-05-14 – 2022-05-16 (×3): 81 mg via ORAL
  Filled 2022-05-13 (×3): qty 1

## 2022-05-13 MED ORDER — SODIUM CHLORIDE 0.9 % IV SOLN: INTRAVENOUS | Status: DC

## 2022-05-13 MED ORDER — DULOXETINE HCL 60 MG PO CPEP
60.0000 mg | ORAL_CAPSULE | Freq: Two times a day (BID) | ORAL | Status: DC
  Administered 2022-05-13 – 2022-05-16 (×6): 60 mg via ORAL
  Filled 2022-05-13 (×6): qty 1

## 2022-05-13 NOTE — ED Notes (Signed)
ED TO INPATIENT HANDOFF REPORT  ED Nurse Name and Phone #: [phone] [facility]  S Name/Age/Gender Kelsey Diaz 77 y.o. female Room/Bed: TRACC/TRACC  Code Status   Code Status: Full Code  Home/SNF/Other Home Patient oriented to: self, place, time, and situation Is this baseline? Yes   Triage Complete: Triage complete  Chief Complaint Ventricular tachycardia [facility]) [I47.20]  Triage Note Patient came to the ED after being called y her heart monitor company when they captured [facility] earlier today. Patient states the the heart monitor was placed earlier this morning and they have called her 6-7 times today. Patient states she is dizzy and weak at this time.    Allergies Allergies  Allergen Reactions   Morphine Other (See Comments)    Stopped heart   Advair Diskus [Fluticasone-Salmeterol] Other (See Comments)    Sensitivity to smells   Ambien [Zolpidem Tartrate] Other (See Comments)    irritable   Amlodipine Besylate Other (See Comments)    Feels bad   Azithromycin Other (See Comments)     Unknown   Bextra [Valdecoxib] Other (See Comments)    Stomach issues   Bupropion Other (See Comments)    involuntary muscle movements   Captopril Other (See Comments)    Ache   Irbesartan Other (See Comments)    dizzy   Latex Itching    Bandaids only   Lisinopril Cough   Pacerone [Amiodarone] Other (See Comments)    falls   Pulmicort [Budesonide] Other (See Comments)    Worse breathing, sensitivity to smells   Savella [Milnacipran Hcl] Other (See Comments)    Mental fog   Spiriva [Tiotropium Bromide Monohydrate] Other (See Comments)    Dry mouth   Suvorexant Other (See Comments)     not effective   Tiotropium Bromide Monohydrate Other (See Comments)     dry mouth    Level of Care/Admitting Diagnosis ED Disposition     ED Disposition  Admit   Condition  --   Comment  Hospital Area: [place] [[id number]]  Level of Care: Telemetry Cardiac [103]  May  admit patient to [facility] or [facility] if equivalent level of care is available:: No  Covid Evaluation: Asymptomatic - no recent exposure (last 10 days) testing not required  Diagnosis: Ventricular tachycardia [facility]) [id number]  Admitting Physician: Carron Brazen  Attending Physician: Josue Hector [id number]  Certification:: I certify this patient will need inpatient services for at least 2 midnights  Estimated Length of Stay: 3          B Medical/Surgery History Past Medical History:  Diagnosis Date   Allergic rhinitis    Anxiety    Asthma    Bilateral hip pain 11/28/2018   Colitis    COPD (chronic obstructive pulmonary disease) ([place])    Fibromyalgia 05/16/2019   GERD (gastroesophageal reflux disease)    With esophageal strictures   Hypertension    Hyperthyroidism    OSA (obstructive sleep apnea) 02/09/2012   No mask.  Did not tolerate   Osteoarthritis    Rheumatoid arthritis ([place]) 10/03/2018   Past Surgical History:  Procedure Laterality Date   BREAST BIOPSY     x2   CATARACT EXTRACTION W/PHACO Left 09/27/2018   Procedure: CATARACT EXTRACTION PHACO AND INTRAOCULAR LENS PLACEMENT ([place]) LEFT;  Surgeon: Birder Robson, MD;  Location: [place];  Service: Ophthalmology;  Laterality: Left;   CATARACT EXTRACTION W/PHACO Right 11/29/2018   Procedure: CATARACT EXTRACTION PHACO AND INTRAOCULAR LENS PLACEMENT (  [facility]) RIGHT;  Surgeon: Birder Robson, MD;  Location: [place];  Service: Ophthalmology;  Laterality: Right;  1:22 20.1% 16.64   CHOLECYSTECTOMY     2005   KNEE SURGERY Right 2013   two torn ligaments repaired.    LEFT HEART CATH AND CORONARY ANGIOGRAPHY N/A 06/08/2017   Procedure: LEFT HEART CATH AND CORONARY ANGIOGRAPHY;  Surgeon: Troy Sine, MD;  Location: [place] [facility];  Service: Cardiovascular;  Laterality: N/A;   LEFT HEART CATH AND CORONARY ANGIOGRAPHY N/A 12/12/2020   Procedure: LEFT HEART CATH AND CORONARY ANGIOGRAPHY;   Surgeon: Lorretta Harp, MD;  Location: [place] [facility];  Service: Cardiovascular;  Laterality: N/A;   NASAL SINUS SURGERY     V TACH ABLATION N/A 12/16/2020   Procedure: V TACH ABLATION;  Surgeon: Vickie Epley, MD;  Location: [place] [facility];  Service: Cardiovascular;  Laterality: N/A;   VAGINAL HYSTERECTOMY       A IV Location/Drains/Wounds Patient Lines/Drains/Airways Status     Active Line/Drains/Airways     Name Placement date Placement time Site Days   Peripheral IV 05/13/22 20 G Right Antecubital 05/13/22  1819  Antecubital  less than 1   Epidural Catheter 04/06/22 04/06/22  1300  -- 37   External Urinary Catheter 05/13/22  1906  --  less than 1            Intake/Output Last 24 hours No intake or output data in the 24 hours ending 05/13/22 1922  Labs/Imaging No results found for this or any previous visit (from the past 69 hour(s)). No results found.  Pending Labs Unresulted Labs (From admission, onward)     Start     Ordered   05/13/22 1847  Magnesium  Once,   STAT        05/13/22 1846   05/13/22 1804  Comprehensive metabolic panel  Once,   STAT        05/13/22 1803   05/13/22 1804  CBC with Differential  Once,   STAT        05/13/22 1803   Signed and Held  Basic metabolic panel  Tomorrow morning,   R        Signed and Held   Signed and Held  CBC  Tomorrow morning,   R        Signed and Held   Signed and Held  Magnesium  Tomorrow morning,   R        Signed and Held   Signed and Held  CBC  (Pharmacologic VTE prophylaxis)  Once,   R       Comments: Baseline for heparin therapy IF NOT ALREADY DRAWN.  Notify MD if PLT < 100 K.    Signed and Held   Signed and Held  Creatinine, serum  (Pharmacologic VTE prophylaxis)  Once,   R       Comments: Baseline for heparin therapy IF NOT ALREADY DRAWN.    Signed and Held            Vitals/Pain Today's Vitals   05/13/22 1810 05/13/22 1815 05/13/22 1900 05/13/22 1915  BP: 119/86 131/74 117/88  125/78  Pulse: 84 73 78 72  Resp: 14 (!) 21 17 18   Temp: (!) 96.7 F (35.9 C)     TempSrc: Temporal     SpO2: 97% 99% 99% 95%  Weight:      Height:      PainSc:  Isolation Precautions No active isolations  Medications Medications  0.9 %  sodium chloride infusion ( Intravenous New Bag/Given 05/13/22 1901)    Mobility walks with device (cane at home, sometimes a walker)     Focused Assessments Cardiac Assessment Handoff:  Cardiac Rhythm: Normal sinus rhythm No results found for: "CKTOTAL", "CKMB", "CKMBINDEX", "TROPONINI" No results found for: "DDIMER" Does the Patient currently have chest pain? No     Cardiac Rhythm: Normal sinus rhythm       Neuro Assessment: Within Defined Limits Neuro Checks:      Has TPA been given? No If patient is a Neuro Trauma and patient is going to OR before floor call report to [place] nurse: [phone] or [phone]   R Recommendations: See Admitting Provider Note  Report given to:   Additional Notes: Pt converted herself PTA, not episodes of VT while in ER, NSR on the monitor, pt A&O x4, NAD noted, family at the bedside. Pt has purwick. Okay for pt to eat, daughter getting pt dinner as she has not ate today

## 2022-05-13 NOTE — Telephone Encounter (Signed)
Received call from [employer]  Multiple runs of VT 135-207 - at 4:34-4:40 pm [place].  They spoke to patient's daughter.   I also spoke with her daughter Lynelle Smoke, they are 5-10 min from the ER.   ER charge RN aware of pending arrival.

## 2022-05-13 NOTE — ED Triage Notes (Signed)
Patient came to the ED after being called y her heart monitor company when they captured [facility] earlier today. Patient states the the heart monitor was placed earlier this morning and they have called her 6-7 times today. Patient states she is dizzy and weak at this time.

## 2022-05-13 NOTE — Progress Notes (Unsigned)
ZIO AT serial # [id number] from office inventory applied to patient.  Dr. Quentin Ore to read.

## 2022-05-13 NOTE — Telephone Encounter (Signed)
Caller is reporting abnormal readings. 

## 2022-05-13 NOTE — H&P (Signed)
Physician History and Physical     Patient ID: Kelsey Diaz MRN: [id number] DOB/AGE: June 18, 1945 77 y.o. Admit date: 05/13/2022  Primary Care Physician: Harlan Stains, MD Primary Cardiologist: Quentin Ore EP  Principal Problem:   Ventricular tachycardia [facility])   HPI:  77 y.o. sent to ER by our EP service when monitor noted frequent episodes of NSVT. Patient has known history of NSVT. Thought to be automaticity ? From RV outflow tract. EP study 11/2020 could not induce with isuprel even. Cath 12/12/20 no CAD EF normal by TTE 05/04/22 and MRI 12/13/20 MRI only showed small area of mid myocardial gad uptake in septum which was non specific She has had some fatigue and lightheadedness with minimal palpitations No chest pain , dyspnea She lives with chronically ill daughter and another daughter with her today. She lost two husbands her husband 3 years ago and her first husband this year. She has been on Toprol 25 mg and Magoxide  Labs in ER currently pending  05/04/22 K 4.1 TSH normal Mg 1.6 05/03/22 Telemetry in ER is normal sinus with no NSVT  Review of systems complete and found to be negative unless listed above   Past Medical History:  Diagnosis Date   Allergic rhinitis    Anxiety    Asthma    Bilateral hip pain 11/28/2018   Colitis    COPD (chronic obstructive pulmonary disease) ([place])    Fibromyalgia 05/16/2019   GERD (gastroesophageal reflux disease)    With esophageal strictures   Hypertension    Hyperthyroidism    OSA (obstructive sleep apnea) 02/09/2012   No mask.  Did not tolerate   Osteoarthritis    Rheumatoid arthritis ([place]) 10/03/2018    Family History  Problem Relation Age of Onset   Hypertension Mother    Diabetes Mother    Allergies Mother    Heart attack Father 3   Cancer Father        kidney   Emphysema Father    Heart disease Father        CHF age 32   Heart attack Brother 64       Died of MI age 53    Social History   Socioeconomic History   Marital  status: Married    Spouse name: Not on file   Number of children: 3   Years of education: Not on file   Highest education level: Not on file  Occupational History   Occupation: Retired    Fish farm manager: [employer]  Tobacco Use   Smoking status: Former    Packs/day: 2.00    Years: 40.00    Additional pack years: 0.00    Total pack years: 80.00    Types: Cigarettes    Quit date: 02/24/1999    Years since quitting: 23.2   Smokeless tobacco: Never  Vaping Use   Vaping Use: Never used  Substance and Sexual Activity   Alcohol use: Not Currently    Comment:     Drug use: No   Sexual activity: Not on file  Other Topics Concern   Not on file  Social History Narrative   Lives with husband.  5 children together. (She has 3).     Social Determinants of Health   Financial Resource Strain: Not on file  Food Insecurity: No Food Insecurity (05/04/2022)   Hunger Vital Sign    Worried About Running Out of Food in the Last Year: Never true    Ran Out of Food in the Last Year:  Never true  Transportation Needs: No Transportation Needs (05/04/2022)   PRAPARE - Hydrologist (Medical): No    Lack of Transportation (Non-Medical): No  Physical Activity: Not on file  Stress: Not on file  Social Connections: Not on file  Intimate Partner Violence: Not At Risk (05/04/2022)   Humiliation, Afraid, Rape, and Kick questionnaire    Fear of Current or Ex-Partner: No    Emotionally Abused: No    Physically Abused: No    Sexually Abused: No    Past Surgical History:  Procedure Laterality Date   BREAST BIOPSY     x2   CATARACT EXTRACTION W/PHACO Left 09/27/2018   Procedure: CATARACT EXTRACTION PHACO AND INTRAOCULAR LENS PLACEMENT ([place]) LEFT;  Surgeon: Birder Robson, MD;  Location: [place];  Service: Ophthalmology;  Laterality: Left;   CATARACT EXTRACTION W/PHACO Right 11/29/2018   Procedure: CATARACT EXTRACTION PHACO AND INTRAOCULAR LENS PLACEMENT ([place]) RIGHT;  Surgeon:  Birder Robson, MD;  Location: [place];  Service: Ophthalmology;  Laterality: Right;  1:22 20.1% 16.64   CHOLECYSTECTOMY     2005   KNEE SURGERY Right 2013   two torn ligaments repaired.    LEFT HEART CATH AND CORONARY ANGIOGRAPHY N/A 06/08/2017   Procedure: LEFT HEART CATH AND CORONARY ANGIOGRAPHY;  Surgeon: Troy Sine, MD;  Location: [place] [facility];  Service: Cardiovascular;  Laterality: N/A;   LEFT HEART CATH AND CORONARY ANGIOGRAPHY N/A 12/12/2020   Procedure: LEFT HEART CATH AND CORONARY ANGIOGRAPHY;  Surgeon: Lorretta Harp, MD;  Location: [place] [facility];  Service: Cardiovascular;  Laterality: N/A;   NASAL SINUS SURGERY     V TACH ABLATION N/A 12/16/2020   Procedure: V TACH ABLATION;  Surgeon: Vickie Epley, MD;  Location: [place] [facility];  Service: Cardiovascular;  Laterality: N/A;   VAGINAL HYSTERECTOMY       (Not in a hospital admission)   Physical Exam: Blood pressure 125/78, pulse 72, temperature (!) 96.7 F (35.9 C), temperature source Temporal, resp. rate 18, height 5\' 6"  (1.676 m), weight 88 kg, SpO2 95 %.    Affect appropriate Healthy:  appears stated age 79: normal Neck supple with no adenopathy JVP normal no bruits no thyromegaly Lungs clear with no wheezing and good diaphragmatic motion Heart:  S1/S2 no murmur, no rub, gallop or click PMI normal Abdomen: benighn, BS positve, no tenderness, no AAA no bruit.  No HSM or HJR Distal pulses intact with no bruits No edema Neuro non-focal Skin warm and dry No muscular weakness  No current facility-administered medications on file prior to encounter.   Current Outpatient Medications on File Prior to Encounter  Medication Sig Dispense Refill   albuterol (VENTOLIN HFA) 108 (90 Base) MCG/ACT inhaler Inhale 2 puffs into the lungs as needed for shortness of breath.     aspirin 81 MG chewable tablet Chew 81 mg by mouth daily.     Calcium Carbonate-Vitamin D 600-200 MG-UNIT TABS  Take 1 tablet by mouth daily.      Carboxymethylcellulose Sodium (EYE DROPS OP) Place 2 drops into both eyes as needed (clear eyes).     Clobetasol Prop Emollient Base 0.05 % emollient cream Apply 1 application topically 2 (two) times daily.     CRANBERRY PO Take 1 tablet by mouth daily.     DULoxetine (CYMBALTA) 60 MG capsule Take 60 mg by mouth 2 (two) times daily.     etanercept (ENBREL) 50 MG/ML injection Inject 50  mg into the skin once a week.     fluticasone (FLONASE) 50 MCG/ACT nasal spray Place 2 sprays into both nostrils at bedtime.     [START ON 05/16/2022] HYDROcodone-acetaminophen (NORCO) 7.5-325 MG tablet Take 1 tablet by mouth every 6 (six) hours as needed for moderate pain or severe pain. (Patient taking differently: Take 1 tablet by mouth 4 (four) times daily as needed for moderate pain or severe pain.) 120 tablet 0   ibuprofen (ADVIL) 200 MG tablet Take 400 mg by mouth as needed for headache or moderate pain.     magnesium oxide (MAG-OX) 400 (240 Mg) MG tablet Take 1 tablet (400 mg total) by mouth daily. 30 tablet 1   meloxicam (MOBIC) 15 MG tablet Take 15 mg by mouth daily.     metoprolol succinate (TOPROL XL) 25 MG 24 hr tablet Take 1 tablet (25 mg total) by mouth every evening. 90 tablet 3   Multiple Vitamin (MULTI-VITAMIN PO) Take 1 tablet by mouth daily.      Omega-3 Fatty Acids (FISH OIL PO) Take 2 capsules by mouth daily.     omeprazole (PRILOSEC) 40 MG capsule Take 40 mg by mouth daily.  1   OSPHENA 60 MG TABS Take 60 mg by mouth daily.     rosuvastatin (CRESTOR) 20 MG tablet Take 20 mg by mouth once a week.     Tiotropium Bromide-Olodaterol (STIOLTO RESPIMAT) 2.5-2.5 MCG/ACT AERS Inhale 2 puffs into the lungs daily. (Patient taking differently: Inhale 1 puff into the lungs in the morning and at bedtime.) 4 g 11    Labs:   Lab Results  Component Value Date   WBC 5.3 05/04/2022   HGB 13.4 05/04/2022   HCT 40.5 05/04/2022   MCV 92.5 05/04/2022   PLT 174 05/04/2022    No results for input(s): "NA", "K", "CL", "CO2", "BUN", "CREATININE", "CALCIUM", "PROT", "BILITOT", "ALKPHOS", "ALT", "AST", "GLUCOSE" in the last 168 hours.  Invalid input(s): "LABALBU" No results found for: "CKTOTAL", "CKMB", "CKMBINDEX", "TROPONINI"   Lab Results  Component Value Date   CHOL 113 12/11/2020   Lab Results  Component Value Date   HDL 49 12/11/2020   Lab Results  Component Value Date   LDLCALC 39 12/11/2020   Lab Results  Component Value Date   TRIG 127 12/11/2020   Lab Results  Component Value Date   CHOLHDL 2.3 12/11/2020   No results found for: "LDLDIRECT"     Radiology: ECHOCARDIOGRAM COMPLETE  Result Date: 05/04/2022    ECHOCARDIOGRAM REPORT   Patient Name:   SHAQUAIL LEISHMAN Date of Exam: 05/04/2022 Medical Rec #:  [id number]        Height:       66.0 in Accession #:    [id number]       Weight:       195.8 lb Date of Birth:  Mar 04, 1945       BSA:          1.982 m Patient Age:    76 years         BP:           119/69 mmHg Patient Gender: F                HR:           59 bpm. Exam Location:  Inpatient Procedure: 2D Echo, Cardiac Doppler and Color Doppler Indications:    Ventricular Tachycardia I47.2  History:        Patient has prior  history of Echocardiogram examinations, most                 recent 12/12/2020. COPD, Arrythmias:Tachycardia,                 Signs/Symptoms:Chest Pain and Shortness of Breath; Risk                 Factors:Hypertension and Sleep Apnea.  Sonographer:    Ronny Flurry Referring Phys: [id number] Chauncey T TU IMPRESSIONS  1. Left ventricular ejection fraction, by estimation, is 55 to 60%. The left ventricle has normal function. The left ventricle has no regional wall motion abnormalities. There is mild asymmetric left ventricular hypertrophy of the basal-septal segment. Left ventricular diastolic parameters are consistent with Grade I diastolic dysfunction (impaired relaxation).  2. Right ventricular systolic function is normal. The right  ventricular size is normal.  3. Left atrial size was mildly dilated.  4. The mitral valve is normal in structure. Trivial mitral valve regurgitation. No evidence of mitral stenosis.  5. The aortic valve is tricuspid. There is mild calcification of the aortic valve. Aortic valve regurgitation is not visualized. Aortic valve sclerosis/calcification is present, without any evidence of aortic stenosis. Aortic valve area, by VTI measures  3.20 cm. Aortic valve mean gradient measures 4.0 mmHg. Aortic valve Vmax measures 1.38 m/s.  6. The inferior vena cava is normal in size with greater than 50% respiratory variability, suggesting right atrial pressure of 3 mmHg. FINDINGS  Left Ventricle: Left ventricular ejection fraction, by estimation, is 55 to 60%. The left ventricle has normal function. The left ventricle has no regional wall motion abnormalities. The left ventricular internal cavity size was normal in size. There is  mild asymmetric left ventricular hypertrophy of the basal-septal segment. Left ventricular diastolic parameters are consistent with Grade I diastolic dysfunction (impaired relaxation). Right Ventricle: The right ventricular size is normal. No increase in right ventricular wall thickness. Right ventricular systolic function is normal. Left Atrium: Left atrial size was mildly dilated. Right Atrium: Right atrial size was normal in size. Pericardium: There is no evidence of pericardial effusion. Mitral Valve: The mitral valve is normal in structure. Trivial mitral valve regurgitation. No evidence of mitral valve stenosis. Tricuspid Valve: The tricuspid valve is normal in structure. Tricuspid valve regurgitation is trivial. No evidence of tricuspid stenosis. Aortic Valve: The aortic valve is tricuspid. There is mild calcification of the aortic valve. Aortic valve regurgitation is not visualized. Aortic valve sclerosis/calcification is present, without any evidence of aortic stenosis. Aortic valve mean  gradient measures 4.0 mmHg. Aortic valve peak gradient measures 7.6 mmHg. Aortic valve area, by VTI measures 3.20 cm. Pulmonic Valve: The pulmonic valve was normal in structure. Pulmonic valve regurgitation is not visualized. No evidence of pulmonic stenosis. Aorta: The aortic root is normal in size and structure. Venous: The inferior vena cava is normal in size with greater than 50% respiratory variability, suggesting right atrial pressure of 3 mmHg. IAS/Shunts: No atrial level shunt detected by color flow Doppler.  LEFT VENTRICLE PLAX 2D LVIDd:         4.60 cm   Diastology LVIDs:         3.20 cm   LV e' medial:    4.35 cm/s LV PW:         1.10 cm   LV E/e' medial:  21.7 LV IVS:        1.30 cm   LV e' lateral:   10.20 cm/s LVOT diam:  2.30 cm   LV E/e' lateral: 9.3 LV SV:         104 LV SV Index:   52 LVOT Area:     4.15 cm  RIGHT VENTRICLE            IVC RV S prime:     8.38 cm/s  IVC diam: 1.40 cm TAPSE (M-mode): 2.1 cm LEFT ATRIUM             Index        RIGHT ATRIUM           Index LA diam:        3.40 cm 1.72 cm/m   RA Area:     13.50 cm LA Vol (A2C):   45.1 ml 22.76 ml/m  RA Volume:   24.10 ml  12.16 ml/m LA Vol (A4C):   50.7 ml 25.58 ml/m LA Biplane Vol: 48.7 ml 24.57 ml/m  AORTIC VALVE AV Area (Vmax):    3.55 cm AV Area (Vmean):   3.43 cm AV Area (VTI):     3.20 cm AV Vmax:           138.00 cm/s AV Vmean:          98.400 cm/s AV VTI:            0.324 m AV Peak Grad:      7.6 mmHg AV Mean Grad:      4.0 mmHg LVOT Vmax:         118.00 cm/s LVOT Vmean:        81.300 cm/s LVOT VTI:          0.249 m LVOT/AV VTI ratio: 0.77  AORTA Ao Root diam: 3.40 cm Ao Asc diam:  3.10 cm MITRAL VALVE MV Area (PHT): 2.95 cm    SHUNTS MV Decel Time: 257 msec    Systemic VTI:  0.25 m MV E velocity: 94.60 cm/s  Systemic Diam: 2.30 cm MV A velocity: 95.70 cm/s MV E/A ratio:  0.99 Glori Bickers MD Electronically signed by Glori Bickers MD Signature Date/Time: 05/04/2022/4:06:02 PM    Final    DG Chest Portable  1 View  Result Date: 05/03/2022 CLINICAL DATA:  Chest pain and palpitations EXAM: PORTABLE CHEST 1 VIEW COMPARISON:  Chest radiograph dated 04/04/2021 FINDINGS: Normal lung volumes. No focal consolidations. No pleural effusion or pneumothorax. The heart size and mediastinal contours are within normal limits. The visualized skeletal structures are unremarkable. Aortic atherosclerosis. IMPRESSION: No active disease. Electronically Signed   By: Darrin Nipper M.D.   On: 05/03/2022 18:37    EKG: Sinus short PR with low atrial focus no pre excitation or ST/T wave changes QT normal   ASSESSMENT AND PLAN:   1. NSVT:  likely mechanism increased automaticity from foci in RVOT Not reentrant with structurally normal heart, EF and no CAD. Continue beta blocker Check K/Mg  Rx long runs with iv lopressor 5 mg at a time or 150 mg bolus of amiodarone EP to see in am.  Consider Ranexa which has some antiarrhythmic properties Patient did not tolerate amiodarone in past  2. Depression: continue Cymbalta with normal QT probably ok 3. HLD:  continue statin  4. COPD: quit smoking year ago but 80 pack year history No wheezing on exam Has Stiolto inhaler Avoid albuterol type beta agonist inhalers    Signed: Collier Salina Nishan3/20/2024, 7:23 PM

## 2022-05-13 NOTE — ED Provider Notes (Signed)
[facility] Provider Note  CSN: [id number] Arrival date & time: 05/13/22 1759  Chief Complaint(s) VTACH  HPI Kelsey Diaz is a 77 y.o. female With PMH ventricular tachycardia, HTN, rheumatoid arthritis, CAD, COPD, OSA   Past Medical History Past Medical History:  Diagnosis Date   Allergic rhinitis    Anxiety    Asthma    Bilateral hip pain 11/28/2018   Colitis    COPD (chronic obstructive pulmonary disease) ([place])    Fibromyalgia 05/16/2019   GERD (gastroesophageal reflux disease)    With esophageal strictures   Hypertension    Hyperthyroidism    OSA (obstructive sleep apnea) 02/09/2012   No mask.  Did not tolerate   Osteoarthritis    Rheumatoid arthritis ([place]) 10/03/2018   Patient Active Problem List   Diagnosis Date Noted   Hypomagnesemia 05/03/2022   Normocytic anemia 05/03/2022   Thrombocytopenia ([place]) 05/03/2022   UTI (urinary tract infection) 04/06/2021   Bacteremia 04/05/2021   Sepsis due to Escherichia coli (E. 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 Authorizing Provider  albuterol (VENTOLIN HFA) 108 (90 Base) MCG/ACT inhaler Inhale 2 puffs into the lungs as needed for shortness of breath.    [provider]  aspirin 81 MG chewable tablet Chew 81 mg by mouth daily.    [provider]  Calcium Carbonate-Vitamin D 600-200  MG-UNIT TABS Take 1 tablet by mouth daily.     [provider]  Carboxymethylcellulose Sodium (EYE DROPS OP) Place 2 drops into both eyes as needed (clear eyes).    [provider]  Clobetasol Prop Emollient Base 0.05 % emollient cream Apply 1 application topically 2 (two) times daily. 01/26/19   [provider]  CRANBERRY PO Take 1 tablet by mouth daily.    [provider]  DULoxetine (CYMBALTA) 60 MG capsule Take 60 mg by mouth 2 (two) times daily.    [provider]  etanercept (ENBREL) 50 MG/ML injection Inject 50 mg into the skin once a week.    [provider]  fluticasone (FLONASE) 50 MCG/ACT nasal spray Place 2 sprays into both nostrils at bedtime. 05/18/18   [provider]  HYDROcodone-acetaminophen (NORCO) 7.5-325 MG tablet Take 1 tablet by mouth every 6 (six) hours as needed for moderate pain or severe pain. Patient taking differently: Take 1 tablet by mouth 4 (four) times daily as needed for moderate pain or severe pain. 05/16/22 06/15/22  Molli Barrows, MD  ibuprofen (ADVIL) 200 MG tablet Take 400 mg by mouth as needed for headache or moderate pain.    [provider]  magnesium oxide (MAG-OX) 400 (240 Mg) MG tablet Take 1 tablet (400 mg total) by mouth daily. 05/05/22   Little Ishikawa, MD  meloxicam (MOBIC) 15 MG tablet Take 15 mg by mouth daily.    [provider]  metoprolol succinate (TOPROL XL) 25 MG  24 hr tablet Take 1 tablet (25 mg total) by mouth every evening. 05/06/22   Vickie Epley, MD  Multiple Vitamin (MULTI-VITAMIN PO) Take 1 tablet by mouth daily.     [provider]  Omega-3 Fatty Acids (FISH OIL PO) Take 2 capsules by mouth daily.    [provider]  omeprazole (PRILOSEC) 40 MG capsule Take 40 mg by mouth daily. 04/08/17   [provider]  OSPHENA 60 MG TABS Take 60 mg by mouth daily. 06/25/19   [provider]  rosuvastatin (CRESTOR) 20 MG tablet  Take 20 mg by mouth once a week. 01/02/20   [provider]  Tiotropium Bromide-Olodaterol (STIOLTO RESPIMAT) 2.5-2.5 MCG/ACT AERS Inhale 2 puffs into the lungs daily. Patient taking differently: Inhale 1 puff into the lungs in the morning and at bedtime. 07/11/21   Marshell Garfinkel, MD                                                                                                                                    Past Surgical History Past Surgical History:  Procedure Laterality Date   BREAST BIOPSY     x2   CATARACT EXTRACTION W/PHACO Left 09/27/2018   Procedure: CATARACT EXTRACTION PHACO AND INTRAOCULAR LENS PLACEMENT ([place]) LEFT;  Surgeon: Birder Robson, MD;  Location: [facility];  Service: Ophthalmology;  Laterality: Left;   CATARACT EXTRACTION W/PHACO Right 11/29/2018   Procedure: CATARACT EXTRACTION PHACO AND INTRAOCULAR LENS PLACEMENT ([address]) RIGHT;  Surgeon: Birder Robson, MD;  Location: [facility];  Service: Ophthalmology;  Laterality: Right;  1:22 20.1% 16.64   CHOLECYSTECTOMY     2005   KNEE SURGERY Right 2013   two torn ligaments repaired.    LEFT HEART CATH AND CORONARY ANGIOGRAPHY N/A 06/08/2017   Procedure: LEFT HEART CATH AND CORONARY ANGIOGRAPHY;  Surgeon: Troy Sine, MD;  Location: [facility];  Service: Cardiovascular;  Laterality: N/A;   LEFT HEART CATH AND CORONARY ANGIOGRAPHY N/A 12/12/2020   Procedure: LEFT HEART CATH AND CORONARY ANGIOGRAPHY;  Surgeon: Lorretta Harp, MD;  Location: [facility];  Service: Cardiovascular;  Laterality: N/A;   NASAL SINUS SURGERY     V TACH ABLATION N/A 12/16/2020   Procedure: V TACH ABLATION;  Surgeon: Vickie Epley, MD;  Location: [facility];  Service: Cardiovascular;  Laterality: N/A;   VAGINAL HYSTERECTOMY     Family History Family History  Problem Relation Age of Onset   Hypertension Mother    Diabetes Mother    Allergies Mother    Heart attack Father 75    Cancer Father        kidney   Emphysema Father    Heart disease Father        CHF age 71   Heart attack Brother 44       Died of MI age 62    Social History  Social History   Tobacco Use   Smoking status: Former    Packs/day: 2.00    Years: 40.00    Additional pack years: 0.00    Total pack years: 80.00    Types: Cigarettes    Quit date: 02/24/1999    Years since quitting: 23.2   Smokeless tobacco: Never  Vaping Use   Vaping Use: Never used  Substance Use Topics   Alcohol use: Not Currently    Comment:     Drug use: No   Allergies Morphine, Advair diskus [fluticasone-salmeterol], Ambien [zolpidem tartrate], Amlodipine besylate, Azithromycin, Bextra [valdecoxib], Bupropion, Captopril, Irbesartan, Latex, Lisinopril, Pacerone [amiodarone], Pulmicort [budesonide], Savella [milnacipran hcl], Spiriva [tiotropium bromide monohydrate], Suvorexant, and Tiotropium bromide monohydrate  Review of Systems Review of Systems *** Physical Exam Vital Signs  I have reviewed the triage vital signs Ht 5\' 6"  (1.676 m)   Wt 88 kg   BMI 31.31 kg/m  *** Physical Exam  ED Results and Treatments Labs (all labs ordered are listed, but only abnormal results are displayed) Labs Reviewed  COMPREHENSIVE METABOLIC PANEL  CBC WITH DIFFERENTIAL/PLATELET  TROPONIN I (HIGH SENSITIVITY)                                                                                                                          Radiology No results found.  Pertinent labs & imaging results that were available during my care of the patient were reviewed by me and considered in my medical decision making (see MDM for details).  Medications Ordered in ED Medications - No data to display                                                                                                                                   Procedures Procedures  (including critical care time)  Medical Decision Making / ED Course   This  patient presents to the ED for concern of ***, this involves an extensive number of treatment options, and is a complaint that carries with it a high risk of complications and morbidity.  The differential diagnosis includes ***  MDM: ***   Additional history obtained: -Additional history obtained from *** -External records from outside source obtained and reviewed including: Chart review including previous notes, labs, imaging, consultation notes   Lab Tests: -I ordered, reviewed, and interpreted labs.   The pertinent results include:   Labs Reviewed  COMPREHENSIVE  METABOLIC PANEL  CBC WITH DIFFERENTIAL/PLATELET  TROPONIN I (HIGH SENSITIVITY)      EKG ***  EKG Interpretation  Date/Time:    Ventricular Rate:    PR Interval:    QRS Duration:   QT Interval:    QTC Calculation:   R Axis:     Text Interpretation:           Imaging Studies ordered: I ordered imaging studies including *** I independently visualized and interpreted imaging. I agree with the radiologist interpretation   Medicines ordered and prescription drug management: No orders of the defined types were placed in this encounter.   -I have reviewed the patients home medicines and have made adjustments as needed  Critical interventions ***  Consultations Obtained: I requested consultation with the ***,  and discussed lab and imaging findings as well as pertinent plan - they recommend: ***   Cardiac Monitoring: The patient was maintained on a cardiac monitor.  I personally viewed and interpreted the cardiac monitored which showed an underlying rhythm of: ***  Social Determinants of Health:  Factors impacting patients care include: ***   Reevaluation: After the interventions noted above, I reevaluated the patient and found that they have :{resolved/improved/worsened:23923::"improved"}  Co morbidities that complicate the patient evaluation  Past Medical History:  Diagnosis Date   Allergic  rhinitis    Anxiety    Asthma    Bilateral hip pain 11/28/2018   Colitis    COPD (chronic obstructive pulmonary disease) ([place])    Fibromyalgia 05/16/2019   GERD (gastroesophageal reflux disease)    With esophageal strictures   Hypertension    Hyperthyroidism    OSA (obstructive sleep apnea) 02/09/2012   No mask.  Did not tolerate   Osteoarthritis    Rheumatoid arthritis ([place]) 10/03/2018      Dispostion: I considered admission for this patient, ***     Final Clinical Impression(s) / ED Diagnoses Final diagnoses:  None     @PCDICTATION @

## 2022-05-13 NOTE — ED Notes (Signed)
This NT put a pure wick on this pt 

## 2022-05-13 NOTE — ED Notes (Signed)
Pt's bed ready, receiving RN notified that pt is on her way to unit. Ysidro Evert RN to transport pt

## 2022-05-13 NOTE — Patient Instructions (Signed)
Medication Instructions:  1.Stop valsartan *If you need a refill on your cardiac medications before your next appointment, please call your pharmacy*  Lab Work: BMET today If you have labs (blood work) drawn today and your tests are completely normal, you will receive your results only by: [place] (if you have MyChart) OR A paper copy in the mail If you have any lab test that is abnormal or we need to change your treatment, we will call you to review the results.   Follow-Up: At [facility], you and your health needs are our priority.  As part of our continuing mission to provide you with exceptional heart care, we have created designated Provider Care Teams.  These Care Teams include your primary Cardiologist (physician) and Advanced Practice Providers (APPs -  Physician Assistants and Nurse Practitioners) who all work together to provide you with the care you need, when you need it.  Your next appointment:   05/26/2022 at 12:20 PM  Provider:   Legrand Como "Jonni Sanger" Tillery, PA-C   ZIO AT Long term monitor-Live Telemetry  Your physician has requested you wear a ZIO patch monitor for 7 days.  This is a single patch monitor. [employer] supplies one patch monitor per enrollment. Additional  stickers are not available.  Please do not apply patch if you will be having a Nuclear Stress Test, Echocardiogram, Cardiac CT, MRI,  or Chest Xray during the period you would be wearing the monitor. The patch cannot be worn during  these tests. You cannot remove and re-apply the ZIO AT patch monitor.  Your ZIO patch monitor will be mailed 3 day USPS to your address on file. It may take 3-5 days to  receive your monitor after you have been enrolled.  Once you have received your monitor, please review the enclosed instructions. Your monitor has  already been registered assigning a specific monitor serial # to you.   Billing and Patient Assistance Program information  Theodore Demark has been  supplied with any insurance information on record for billing. [employer] offers a sliding scale Patient Assistance Program for patients without insurance, or whose  insurance does not completely cover the cost of the ZIO patch monitor. You must apply for the  Patient Assistance Program to qualify for the discounted rate. To apply, call [employer] at [phone],  select option 4, select option 2 , ask to apply for the Patient Assistance Program, (you can request an  interpreter if needed). [employer] will ask your household income and how many people are in your  household. [employer] will quote your out-of-pocket cost based on this information. They will also be able  to set up a 12 month interest free payment plan if needed.  Applying the monitor   Shave hair from upper left chest.  Hold the abrader disc by orange tab. Rub the abrader in 40 strokes over left upper chest as indicated in  your monitor instructions.  Clean area with 4 enclosed alcohol pads. Use all pads to ensure the area is cleaned thoroughly. Let  dry.  Apply patch as indicated in monitor instructions. Patch will be placed under collarbone on left side of  chest with arrow pointing upward.  Rub patch adhesive wings for 2 minutes. Remove the white label marked "1". Remove the white label  marked "2". Rub patch adhesive wings for 2 additional minutes.  While looking in a mirror, press and release button in center of patch. A small green light will flash 3-4  times. This will  be your only indicator that the monitor has been turned on.  Do not shower for the first 24 hours. You may shower after the first 24 hours.  Press the button if you feel a symptom. You will hear a small click. Record Date, Time and Symptom in  the Patient Log.   Starting the Gateway  In your kit there is a Hydrographic surveyor box the size of a cellphone. This is Airline pilot. It transmits all your  recorded data to [facility]. This box must always stay within 10 feet of  you. Open the box and push the *  button. There will be a light that blinks orange and then green a few times. When the light stops  blinking, the Gateway is connected to the ZIO patch. Call [employer] at [phone] to confirm your monitor is transmitting.  Returning your monitor  Remove your patch and place it inside the [place]. In the lower half of the Gateway there is a white  bag with prepaid postage on it. Place Gateway in bag and seal. Mail package back to [place] as soon as  possible. Your physician should have your final report approximately 7 days after you have mailed back  your monitor. Call [place] at [phone] if you have questions regarding your ZIO AT  patch monitor. Call them immediately if you see an orange light blinking on your monitor.  If your monitor falls off in less than 4 days, contact our Monitor department at [phone]. If your  monitor becomes loose or falls off after 4 days call [employer] at [phone] for suggestions on  securing your monitor

## 2022-05-13 NOTE — Telephone Encounter (Signed)
Call from [place] at [facility] to alert that patient had long run of vtach at 2:50 pm. Patient's heart monitor was applied today at her visit with M. Tillery, PA at 11 AM. Called patient to see if she had any symptoms, patient stated " I wasn't feeling good at my visit with the PA today and I am still feeling weak and tired." Patient denies chest pain but states she has had increasing SOB with exertion over the past few weeks. Patient also had an ED visit 05/03/22 where she was found to have runs of NSVT on telemetry. BP's were low at office visit today and patient seemed to have some symptoms of low BP. Patient states she has not taken her metoprolol today as she normally takes it at night. Talked with DOD Dr. Caryl Comes who recommended patient proceed to ED.   Called patient and daughter Otila Kluver back and advised they call EMS and proceed to ED, provided education that ventricular tachycardia can lead to chest pain, shortness of breath and loss of consciousness indicative of cardiac arrest. Patient and spouse verbalize understanding and agree to plan. Called hospital coordinator Trish to advise patient had been directed to go to ED and that Zio strips would be scanned into chart ASAP.

## 2022-05-13 NOTE — ED Notes (Signed)
Secure message sent to receiving RN Marcelyn Ditty, awaiting response. Pt has an inpatient bed at this time. Handoff report is in pt's EMR. Bed currently in "dirty" status. Initiating reports so when bed is "ready" pt may go up to admission

## 2022-05-13 NOTE — Significant Event (Signed)
Rapid Response Event Note   Reason for Call :  NSVT  Initial Focused Assessment:  Pt lying in bed with eyes open, alert and oriented, in no visible distress. Pt is SR-75 on my evaluation. She denies CP/SOB/dizziness however says when she is in VT, she does experience SOB.   T-98, HR-75, 134/39, RR-20, SpO2-100% SpO2-100% on RA.   EKG monitor reviewed. Pt is having frequent runs of vtach>last run lasting 3 minutes.  Interventions:  Tx to ICU for closer monitoring.  Plan of Care:  Tx to 2H.    Event Summary:   MD Notified: Dr. Nira Retort Call [phone] Arrival [phone] End [id number]  Dillard Essex, RN

## 2022-05-13 NOTE — Telephone Encounter (Signed)
   Cardiac Monitor Alert  Date of alert:  05/13/2022   Patient Name: Kelsey Diaz  DOB: 1945-09-30  MRN: [id number]   [facility] Cardiologist: Minus Breeding, MD  [facility] EP:  Vickie Epley, MD    Monitor Information: Long Term Monitor-Live Telemetry [ZioAT]  Reason:  Ventricular Tachycardia Ordering provider:  Piedad Climes, PA-C   Alert Ventricular Tachycardia This is the 4th alert for this rhythm.   Next Cardiology Appointment   Date:  05/13/2022;  Pt taken to [facility] Emergency Department.  The patient was contacted today.  She is symptomatic.  She reports the following symptoms:  Fatigue, and weakness, low BP. Arrhythmia, symptoms and history reviewed with DOD, Dr. Virl Axe;  [employer] reports reviewed with Dr. Lars Mage ( Four [employer] reports in total. )   Plan:  Family took patient to the [facility] Emergency Department for emergency intervention; per DOD, Dr. Virl Axe, and Danae Chen RN recommendations.     Other: Pt saw Mr. Barrington Ellison PA-C in clinic today, 05/13/2022 [facility] clinic, where [facility] heart monitor was placed. Pt Zio monitor captured 1st episode of VT at 251 pm with HR of 142 to 158 bpm, lasting 1 minute 37 secs. [employer] notified [facility] Triage, [employer].  Danae Chen RN took [employer] report to DOD, Dr. Virl Axe, who ordered Pt to go to nearest ER for providers care.  2nd episode of VT captured at 331 pm, with HR of 146 to 168 bpm, lasting 2 minutes, 37 secs.  3rd episode of VT captured at 335 pm, with HR of 74 to 194 bpm, lasting 1 minute, 15 secs.  4th episode of VT captured at 335 pm, lasting only 6 beats. The last Irhythm report was received at 402 pm, sinus rhythm was present with min HR of 74 bpm, and max HR of 77 bpm.  The reports were taken to Dr. Lars Mage at the [facility] on [address] office for review and Dr. Quentin Ore signed the Ir[employer] reports. Dr. Quentin Ore requested a follow up call to the patient /  family to confirm they made it to the Emergency Department for care.  Danae Chen RN called patient daughter, and confirmed patient was in the ED receiving care at the time of her call.     Varney Daily, RN  05/13/2022 6:01 PM

## 2022-05-14 ENCOUNTER — Ambulatory Visit: Payer: Medicare HMO | Admitting: Internal Medicine

## 2022-05-14 ENCOUNTER — Telehealth: Payer: Self-pay

## 2022-05-14 DIAGNOSIS — R002 Palpitations: Secondary | ICD-10-CM | POA: Diagnosis not present

## 2022-05-14 DIAGNOSIS — I472 Ventricular tachycardia, unspecified: Secondary | ICD-10-CM | POA: Diagnosis not present

## 2022-05-14 LAB — CBC
HCT: 37.3 % (ref 36.0–46.0)
Hemoglobin: 12.6 g/dL (ref 12.0–15.0)
MCH: 30.7 pg (ref 26.0–34.0)
MCHC: 33.8 g/dL (ref 30.0–36.0)
MCV: 90.8 fL (ref 80.0–100.0)
Platelets: 170 10*3/uL (ref 150–400)
RBC: 4.11 MIL/uL (ref 3.87–5.11)
RDW: 13.5 % (ref 11.5–15.5)
WBC: 5.1 10*3/uL (ref 4.0–10.5)
nRBC: 0 % (ref 0.0–0.2)

## 2022-05-14 LAB — BASIC METABOLIC PANEL
Anion gap: 7 (ref 5–15)
BUN/Creatinine Ratio: 24 (ref 12–28)
BUN: 15 mg/dL (ref 8–23)
BUN: 22 mg/dL (ref 8–27)
CO2: 22 mmol/L (ref 20–29)
CO2: 23 mmol/L (ref 22–32)
Calcium: 8.5 mg/dL — ABNORMAL LOW (ref 8.9–10.3)
Calcium: 9.3 mg/dL (ref 8.7–10.3)
Chloride: 101 mmol/L (ref 96–106)
Chloride: 105 mmol/L (ref 98–111)
Creatinine, Ser: 0.79 mg/dL (ref 0.44–1.00)
Creatinine, Ser: 0.9 mg/dL (ref 0.57–1.00)
GFR, Estimated: >60 mL/min (ref >60)
Glucose, Bld: 129 mg/dL — ABNORMAL HIGH (ref 70–99)
Glucose: 98 mg/dL (ref 70–99)
Potassium: 4.1 mmol/L (ref 3.5–5.1)
Potassium: 4.5 mmol/L (ref 3.5–5.2)
Sodium: 135 mmol/L (ref 135–145)
Sodium: 138 mmol/L (ref 134–144)
eGFR: 66 mL/min/{1.73_m2} (ref >59)

## 2022-05-14 LAB — GLUCOSE, CAPILLARY: Glucose-Capillary: 120 mg/dL — ABNORMAL HIGH (ref 70–99)

## 2022-05-14 LAB — MRSA NEXT GEN BY PCR, NASAL: MRSA by PCR Next Gen: NOT DETECTED

## 2022-05-14 LAB — MAGNESIUM: Magnesium: 2.3 mg/dL (ref 1.7–2.4)

## 2022-05-14 MED ORDER — METOPROLOL SUCCINATE ER 50 MG PO TB24
50.0000 mg | ORAL_TABLET | Freq: Two times a day (BID) | ORAL | Status: DC
  Administered 2022-05-14 – 2022-05-16 (×5): 50 mg via ORAL
  Filled 2022-05-14 (×5): qty 1

## 2022-05-14 MED ORDER — METOPROLOL TARTRATE 5 MG/5ML IV SOLN: 5.0000 mg | INTRAVENOUS | Status: DC | PRN

## 2022-05-14 MED ORDER — ORAL CARE MOUTH RINSE: 15.0000 mL | OROMUCOSAL | Status: DC | PRN

## 2022-05-14 MED ORDER — MAGNESIUM OXIDE -MG SUPPLEMENT 400 (240 MG) MG PO TABS
400.0000 mg | ORAL_TABLET | Freq: Every day | ORAL | Status: DC
  Administered 2022-05-14 – 2022-05-16 (×3): 400 mg via ORAL
  Filled 2022-05-14 (×3): qty 1

## 2022-05-14 MED ORDER — CHLORHEXIDINE GLUCONATE CLOTH 2 % EX PADS
6.0000 | MEDICATED_PAD | Freq: Every day | CUTANEOUS | Status: DC
  Administered 2022-05-14: 6 via TOPICAL

## 2022-05-14 MED ORDER — HYDROCODONE-ACETAMINOPHEN 7.5-325 MG/15ML PO SOLN
10.0000 mL | Freq: Four times a day (QID) | ORAL | Status: DC | PRN
  Administered 2022-05-14 – 2022-05-16 (×6): 10 mL via ORAL
  Filled 2022-05-14 (×6): qty 15

## 2022-05-14 MED ORDER — AMIODARONE HCL 200 MG PO TABS
400.0000 mg | ORAL_TABLET | Freq: Every day | ORAL | Status: DC
  Administered 2022-05-14 – 2022-05-16 (×3): 400 mg via ORAL
  Filled 2022-05-14 (×3): qty 2

## 2022-05-14 NOTE — Plan of Care (Signed)

## 2022-05-14 NOTE — TOC CM/SW Note (Signed)
CM spoke to pt and she lives with her dtr. Her dtr and husband assist with her care. She is agreeable to [facility]. Will continue to follow for dc needs. [place], Heart Failure TOC CM [phone]

## 2022-05-14 NOTE — Consult Note (Signed)
Cardiology Consultation   Patient ID: Kelsey Diaz MRN: [id number]; DOB: 1945-03-08  Admit date: 05/13/2022 Date of Consult: 05/14/2022  PCP:  Harlan Stains, MD   [place] [facility] Cardiologist:  Minus Breeding, MD  Electrophysiologist:  Vickie Epley, MD  {    Patient Profile:   Kelsey Diaz is a 77 y.o. female with a hx of RA, fibromyalgia (chronic opioid use), non-obstructive CAD, OSA (untreated), HTN, VT  who is being seen 05/14/2022 for the evaluation of VT at the request of Dr. Johnsie Cancel.  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 Kelsey Diaz was seen yesterday with reports of feeling poorly, she was found with HR 80s, though markedly orthostatic, felt to be the etiology of her symptoms, her valsartan stopped, though given her hx planned for monitoring as well.  Monitor quickly noted recurrent sustained MMVT and she was referred to the ER   LABS K+ 4.1 > 4.1 Mag 1.9 > 2.3 BUN/Creat 15/0.79 HS Trop 6, 6 WBC 5.1 H?H 12//37 Plts 170  Feels well this morning, no CP, SOB   Past Medical History:  Diagnosis Date   Allergic rhinitis    Anxiety    Asthma    Bilateral hip pain 11/28/2018   Colitis    COPD (chronic obstructive pulmonary disease) ([place])    Fibromyalgia 05/16/2019   GERD (gastroesophageal reflux disease)    With esophageal strictures   Hypertension    Hyperthyroidism    OSA (obstructive sleep apnea) 02/09/2012   No mask.  Did not tolerate   Osteoarthritis    Rheumatoid arthritis ([place]) 10/03/2018    Past Surgical History:  Procedure Laterality Date   BREAST BIOPSY     x2   CATARACT EXTRACTION W/PHACO Left 09/27/2018   Procedure: CATARACT EXTRACTION PHACO AND INTRAOCULAR LENS PLACEMENT ([place]) LEFT;  Surgeon: Birder Robson, MD;  Location: [facility];  Service: Ophthalmology;  Laterality: Left;    CATARACT EXTRACTION W/PHACO Right 11/29/2018   Procedure: CATARACT EXTRACTION PHACO AND INTRAOCULAR LENS PLACEMENT ([place]) RIGHT;  Surgeon: Birder Robson, MD;  Location: [facility];  Service: Ophthalmology;  Laterality: Right;  1:22 20.1% 16.64   CHOLECYSTECTOMY     2005   KNEE SURGERY Right 2013   two torn ligaments repaired.    LEFT HEART CATH AND CORONARY ANGIOGRAPHY N/A 06/08/2017   Procedure: LEFT HEART CATH AND CORONARY ANGIOGRAPHY;  Surgeon: Troy Sine, MD;  Location: [facility];  Service: Cardiovascular;  Laterality: N/A;   LEFT HEART CATH AND CORONARY ANGIOGRAPHY N/A 12/12/2020   Procedure: LEFT HEART CATH AND CORONARY ANGIOGRAPHY;  Surgeon: Lorretta Harp, MD;  Location: [facility];  Service: Cardiovascular;  Laterality: N/A;   NASAL SINUS SURGERY     V TACH ABLATION N/A 12/16/2020   Procedure: V TACH ABLATION;  Surgeon: Vickie Epley, MD;  Location: [facility];  Service: Cardiovascular;  Laterality: N/A;   VAGINAL HYSTERECTOMY       Home Medications:  Prior to Admission medications   Medication Sig Start Date End Date Taking? Authorizing Provider  albuterol (VENTOLIN HFA) 108 (90 Base) MCG/ACT inhaler Inhale 2 puffs into the lungs as needed for shortness of breath.    [provider]  aspirin 81 MG chewable tablet Chew 81 mg by mouth daily.    [provider]  Calcium Carbonate-Vitamin D 600-200 MG-UNIT TABS Take 1 tablet by mouth daily.     [provider]  Carboxymethylcellulose Sodium (EYE DROPS OP) Place 2 drops into both eyes as needed (clear eyes).    [provider]  Clobetasol Prop Emollient Base 0.05 % emollient cream Apply 1 application topically 2 (two) times daily. 01/26/19   [provider]  CRANBERRY PO Take 1 tablet by mouth daily.    [provider]  DULoxetine (CYMBALTA) 60 MG capsule Take 60 mg by mouth 2 (two) times daily.    [provider]  etanercept  (ENBREL) 50 MG/ML injection Inject 50 mg into the skin once a week.    [provider]  fluticasone (FLONASE) 50 MCG/ACT nasal spray Place 2 sprays into both nostrils at bedtime. 05/18/18   [provider]  HYDROcodone-acetaminophen (NORCO) 7.5-325 MG tablet Take 1 tablet by mouth every 6 (six) hours as needed for moderate pain or severe pain. Patient taking differently: Take 1 tablet by mouth 4 (four) times daily as needed for moderate pain or severe pain. 05/16/22 06/15/22  Molli Barrows, MD  ibuprofen (ADVIL) 200 MG tablet Take 400 mg by mouth as needed for headache or moderate pain.    [provider]  magnesium oxide (MAG-OX) 400 (240 Mg) MG tablet Take 1 tablet (400 mg total) by mouth daily. 05/05/22   Little Ishikawa, MD  meloxicam (MOBIC) 15 MG tablet Take 15 mg by mouth daily.    [provider]  metoprolol succinate (TOPROL XL) 25 MG 24 hr tablet Take 1 tablet (25 mg total) by mouth every evening. 05/06/22   Vickie Epley, MD  Multiple Vitamin (MULTI-VITAMIN PO) Take 1 tablet by mouth daily.     [provider]  Omega-3 Fatty Acids (FISH OIL PO) Take 2 capsules by mouth daily.    [provider]  omeprazole (PRILOSEC) 40 MG capsule Take 40 mg by mouth daily. 04/08/17   [provider]  OSPHENA 60 MG TABS Take 60 mg by mouth daily. 06/25/19   [provider]  rosuvastatin (CRESTOR) 20 MG tablet Take 20 mg by mouth once a week. 01/02/20   [provider]  Tiotropium Bromide-Olodaterol (STIOLTO RESPIMAT) 2.5-2.5 MCG/ACT AERS Inhale 2 puffs into the lungs daily. Patient taking differently: Inhale 1 puff into the lungs in the morning and at bedtime. 07/11/21   Marshell Garfinkel, MD    Inpatient Medications: Scheduled Meds:  amiodarone  400 mg Oral Daily   arformoterol  15 mcg Nebulization BID   And   umeclidinium bromide  1 puff Inhalation Daily   aspirin  81 mg Oral Daily   Chlorhexidine Gluconate Cloth  6  each Topical Daily   DULoxetine  60 mg Oral BID   fluticasone  2 spray Each Nare QHS   heparin  5,000 Units Subcutaneous Q8H   metoprolol succinate  50 mg Oral BID   Ospemifene  60 mg Oral Daily   pantoprazole  40 mg Oral Daily   rosuvastatin  20 mg Oral Weekly   Continuous Infusions:  sodium chloride 100 mL/hr at 05/14/22 0200   PRN Meds: acetaminophen, albuterol, HYDROcodone-acetaminophen, metoprolol tartrate, ondansetron (ZOFRAN) IV, mouth rinse  Allergies:    Allergies  Allergen Reactions   Morphine Other (See Comments)    Stopped heart   Advair Diskus [Fluticasone-Salmeterol] Other (See Comments)    Sensitivity to smells   Ambien [Zolpidem Tartrate] Other (See Comments)    irritable  Amlodipine Besylate Other (See Comments)    Feels bad   Azithromycin Other (See Comments)     Unknown   Bextra [Valdecoxib] Other (See Comments)    Stomach issues   Bupropion Other (See Comments)    involuntary muscle movements   Captopril Other (See Comments)    Ache   Irbesartan Other (See Comments)    dizzy   Latex Itching    Bandaids only   Lisinopril Cough   Pacerone [Amiodarone] Other (See Comments)    falls   Pulmicort [Budesonide] Other (See Comments)    Worse breathing, sensitivity to smells   Savella [Milnacipran Hcl] Other (See Comments)    Mental fog   Spiriva [Tiotropium Bromide Monohydrate] Other (See Comments)    Dry mouth   Suvorexant Other (See Comments)     not effective   Tiotropium Bromide Monohydrate Other (See Comments)     dry mouth    Social History:   Social History   Socioeconomic History   Marital status: Married    Spouse name: Not on file   Number of children: 3   Years of education: Not on file   Highest education level: Not on file  Occupational History   Occupation: Retired    Fish farm manager: [employer]  Tobacco Use   Smoking status: Former    Packs/day: 2.00    Years: 40.00    Additional pack years: 0.00    Total pack years: 80.00     Types: Cigarettes    Quit date: 02/24/1999    Years since quitting: 23.2   Smokeless tobacco: Never  Vaping Use   Vaping Use: Never used  Substance and Sexual Activity   Alcohol use: Not Currently    Comment:     Drug use: No   Sexual activity: Not on file  Other Topics Concern   Not on file  Social History Narrative   Lives with husband.  5 children together. (She has 3).     Social Determinants of Health   Financial Resource Strain: Not on file  Food Insecurity: No Food Insecurity (05/04/2022)   Hunger Vital Sign    Worried About Running Out of Food in the Last Year: Never true    Ran Out of Food in the Last Year: Never true  Transportation Needs: No Transportation Needs (05/04/2022)   PRAPARE - Hydrologist (Medical): No    Lack of Transportation (Non-Medical): No  Physical Activity: Not on file  Stress: Not on file  Social Connections: Not on file  Intimate Partner Violence: Not At Risk (05/04/2022)   Humiliation, Afraid, Rape, and Kick questionnaire    Fear of Current or Ex-Partner: No    Emotionally Abused: No    Physically Abused: No    Sexually Abused: No    Family History:   Family History  Problem Relation Age of Onset   Hypertension Mother    Diabetes Mother    Allergies Mother    Heart attack Father 28   Cancer Father        kidney   Emphysema Father    Heart disease Father        CHF age 68   Heart attack Brother 53       Died of MI age 9     ROS:  Please see the history of present illness.  All other ROS reviewed and negative.     Physical Exam/Data:   Vitals:   05/14/22 0400 05/14/22 0500  05/14/22 0600 05/14/22 0700  BP: 114/70 115/76 119/72 114/70  Pulse: 72 70 64 67  Resp: 19 16 15 20   Temp:      TempSrc:      SpO2: 90% 93% 94% 95%  Weight:      Height:        Intake/Output Summary (Last 24 hours) at 05/14/2022 0806 Last data filed at 05/14/2022 0200 Gross per 24 hour  Intake 261.26 ml  Output 300 ml   Net -38.74 ml      05/14/2022    3:54 AM 05/14/2022   12:00 AM 05/13/2022    6:08 PM  Last 3 Weights  Weight (lbs) 203 lb 14.8 oz 203 lb 14.8 oz 194 lb 0.1 oz  Weight (kg) 92.5 kg 92.5 kg 88 kg     Body mass index is 32.91 kg/m.  General:  Well nourished, well developed, in no acute distress HEENT: normal Neck: no JVD Vascular: No carotid bruits Cardiac:  RRR; no murmurs, gallops or rubs Lungs:  CTA b/l, no wheezing, rhonchi or rales  Abd: soft, nontender Ext: no edema Musculoskeletal:  No deformities, Skin: warm and dry  Neuro:  no focal abnormalities noted Psych:  Normal affect   EKG:  The EKG was personally reviewed and demonstrates:    SR 75bpm, LAD  Telemetry:  Telemetry was personally reviewed and demonstrates:   SR Had sustained VT (140's) and V bigeminy last evening  Relevant CV Studies:   12/16/20: EPS CONCLUSIONS: 1.  No inducible ventricular tachycardia with isoproterenol infusion  2. No early apparent complications. 3.  Continue metoprolol and amiodarone     12/12/20; LHC IMPRESSION: Ms. Mohr has essentially normal coronary arteries and normal filling pressures.  Her ventricular tachycardia is not ischemically mediated.  It may be related to electrolyte abnormalities.  She was hypomagnesemic and hypokalemic and has been repleted.  Further evaluation per the EP service.  Dr. Lars Mage has been notified.  The sheath was removed and a TR band was placed on the right wrist to achieve patent hemostasis.  The patient left lab in stable condition   12/12/20; TTE IMPRESSIONS   1. Left ventricular ejection fraction, by estimation, is 60 to 65%. The  left ventricle has normal function. The left ventricle has no regional  wall motion abnormalities. Left ventricular diastolic parameters are  consistent with Grade I diastolic  dysfunction (impaired relaxation).   2. Right ventricular systolic function is normal. The right ventricular  size is normal.    3. Left atrial size was mild to moderately dilated.   4. The mitral valve is normal in structure. No evidence of mitral valve  regurgitation. No evidence of mitral stenosis.   5. The aortic valve is tricuspid. There is mild calcification of the  aortic valve. Aortic valve regurgitation is not visualized. Mild to  moderate aortic valve sclerosis/calcification is present, without any  evidence of aortic stenosis.   6. The inferior vena cava is normal in size with greater than 50%  respiratory variability, suggesting right atrial pressure of 3 mmHg.    Laboratory Data:  High Sensitivity Troponin:   Recent Labs  Lab 05/03/22 1815 05/03/22 2005 05/13/22 1815 05/13/22 2046  TROPONINIHS 14 15 6 6      Chemistry Recent Labs  Lab 05/13/22 1112 05/13/22 1815 05/14/22 0028  NA 138 134* 135  K 4.5 4.2 4.1  CL 101 97* 105  CO2 22 24 23   GLUCOSE 98 92 129*  BUN 22 19 15  CREATININE 0.90 0.84 0.79  CALCIUM 9.3 9.4 8.5*  MG  --  1.9 2.3  GFRNONAA  --  >60 >60  ANIONGAP  --  13 7    Recent Labs  Lab 05/13/22 1815  PROT 6.9  ALBUMIN 3.7  AST 30  ALT 19  ALKPHOS 56  BILITOT 0.8   Lipids No results for input(s): "CHOL", "TRIG", "HDL", "LABVLDL", "LDLCALC", "CHOLHDL" in the last 168 hours.  Hematology Recent Labs  Lab 05/13/22 1815 05/14/22 0028  WBC 6.9 5.1  RBC 4.71 4.11  HGB 14.3 12.6  HCT 43.9 37.3  MCV 93.2 90.8  MCH 30.4 30.7  MCHC 32.6 33.8  RDW 13.6 13.5  PLT 187 170   Thyroid No results for input(s): "TSH", "FREET4" in the last 168 hours.  BNPNo results for input(s): "BNP", "PROBNP" in the last 168 hours.  DDimer No results for input(s): "DDIMER" in the last 168 hours.   Radiology/Studies:  No results found.   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 Quentin Ore discussed at length today with the patient, no good alternative AAD options, she understands, willing to try Will start amiodarone PO today, follow closely  Low BPs yesterday in the office most likely 2/2 VT BP here are good Increase Toprol to BID  RA Fibromyalgia Continue home meds/regime Chronic opioid use  HTN Relative hypotension with marked orthostatics yesterday Again, suspect despite vitals with HR 80 that she was probably in or in/out VT     Risk Assessment/Risk Scores:    For questions or updates, please contact [place] Please consult [email or web address] for contact info under    Signed, Baldwin Jamaica, PA-C  05/14/2022 8:06 AM

## 2022-05-14 NOTE — Progress Notes (Signed)
  Overnight update: Episode of sustained V which terminated spontaneously, transferred to the ICU for closer monitoring. 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 Verapamil also an option If continues to recur despite above, then amio/lido

## 2022-05-14 NOTE — Telephone Encounter (Signed)
Multiple monitor alerts received from 05/13/2022 434pm CT-[id number] for NSVT.  Kelsey Diaz and Dr Nira Retort were notified.  Pt had presented to [facility] ED and currently admitted to [facility], ICU.

## 2022-05-15 DIAGNOSIS — I472 Ventricular tachycardia, unspecified: Secondary | ICD-10-CM | POA: Diagnosis not present

## 2022-05-15 MED ORDER — CALCIUM CARBONATE ANTACID 500 MG PO CHEW: 1.0000 | CHEWABLE_TABLET | Freq: Two times a day (BID) | ORAL | Status: DC | PRN

## 2022-05-15 NOTE — Progress Notes (Signed)
Zoll was removed per MD request.

## 2022-05-15 NOTE — Progress Notes (Signed)
Mobility Specialist Progress Note:   05/15/22 1200  Mobility  Activity Ambulated with assistance in hallway  Level of Assistance Contact guard assist, steadying assist  Assistive Device Front wheel walker  Distance Ambulated (ft) 200 ft  Activity Response Tolerated well  Mobility Referral Yes  $Mobility charge 1 Mobility   Pt eager for mobility session. Required only minG assist throughout. No complaints during ambulation. HR 70s throughout. Pt back in chair with all needs met, daughter in room.  Nelta Numbers Mobility Specialist Please contact via SecureChat or  Rehab office at [phone]

## 2022-05-15 NOTE — Progress Notes (Addendum)
Rounding Note    Patient Name: Kelsey Diaz Date of Encounter: 05/15/2022  [place] Cardiologist: Minus Breeding, MD   Subjective   Sleeping, easily woken, no complaints  Inpatient Medications    Scheduled Meds:  amiodarone  400 mg Oral Daily   arformoterol  15 mcg Nebulization BID   And   umeclidinium bromide  1 puff Inhalation Daily   aspirin  81 mg Oral Daily   DULoxetine  60 mg Oral BID   fluticasone  2 spray Each Nare QHS   heparin  5,000 Units Subcutaneous Q8H   magnesium oxide  400 mg Oral Daily   metoprolol succinate  50 mg Oral BID   pantoprazole  40 mg Oral Daily   rosuvastatin  20 mg Oral Weekly   Continuous Infusions:  sodium chloride 100 mL/hr at 05/15/22 0321   PRN Meds: acetaminophen, albuterol, calcium carbonate, HYDROcodone-acetaminophen, metoprolol tartrate, ondansetron (ZOFRAN) IV, mouth rinse   Vital Signs    Vitals:   05/14/22 2145 05/15/22 0013 05/15/22 0440 05/15/22 0747  BP:  (!) 129/98 (!) 148/67   Pulse:  63 64 (!) 58  Resp:  18 16 18   Temp:  98.5 F (36.9 C) 98.4 F (36.9 C)   TempSrc:  Oral Oral   SpO2: 95% 97% 93% 100%  Weight:   92.6 kg   Height:        Intake/Output Summary (Last 24 hours) at 05/15/2022 0807 Last data filed at 05/15/2022 0447 Gross per 24 hour  Intake 1428.71 ml  Output 2250 ml  Net -821.29 ml      05/15/2022    4:40 AM 05/14/2022    3:54 AM 05/14/2022   12:00 AM  Last 3 Weights  Weight (lbs) 204 lb 2.3 oz 203 lb 14.8 oz 203 lb 14.8 oz  Weight (kg) 92.6 kg 92.5 kg 92.5 kg      Telemetry    SR 60's, no VT - Personally Reviewed  ECG    No new EKGs - Personally Reviewed  Physical Exam   GEN: No acute distress.   Neck: No JVD Cardiac: RRR, no murmurs, rubs, or gallops.  Respiratory: CTA b/l. GI: Soft, nontender, non-distended  MS: No edema; No deformity. Neuro:  Nonfocal  Psych: Normal affect   Labs    High Sensitivity Troponin:   Recent Labs  Lab 05/03/22 1815  05/03/22 2005 05/13/22 1815 05/13/22 2046  TROPONINIHS 14 15 6 6      Chemistry Recent Labs  Lab 05/13/22 1112 05/13/22 1815 05/14/22 0028  NA 138 134* 135  K 4.5 4.2 4.1  CL 101 97* 105  CO2 22 24 23   GLUCOSE 98 92 129*  BUN 22 19 15   CREATININE 0.90 0.84 0.79  CALCIUM 9.3 9.4 8.5*  MG  --  1.9 2.3  PROT  --  6.9  --   ALBUMIN  --  3.7  --   AST  --  30  --   ALT  --  19  --   ALKPHOS  --  56  --   BILITOT  --  0.8  --   GFRNONAA  --  >60 >60  ANIONGAP  --  13 7    Lipids No results for input(s): "CHOL", "TRIG", "HDL", "LABVLDL", "LDLCALC", "CHOLHDL" in the last 168 hours.  Hematology Recent Labs  Lab 05/13/22 1815 05/14/22 0028  WBC 6.9 5.1  RBC 4.71 4.11  HGB 14.3 12.6  HCT 43.9 37.3  MCV 93.2 90.8  MCH 30.4 30.7  MCHC 32.6 33.8  RDW 13.6 13.5  PLT 187 170   Thyroid No results for input(s): "TSH", "FREET4" in the last 168 hours.  BNPNo results for input(s): "BNP", "PROBNP" in the last 168 hours.  DDimer No results for input(s): "DDIMER" in the last 168 hours.   Radiology    No results found.  Cardiac Studies   12/16/20: EPS CONCLUSIONS: 1.  No inducible ventricular tachycardia with isoproterenol infusion  2. No early apparent complications. 3.  Continue metoprolol and amiodarone     12/12/20; LHC IMPRESSION: Ms. Lauby has essentially normal coronary arteries and normal filling pressures.  Her ventricular tachycardia is not ischemically mediated.  It may be related to electrolyte abnormalities.  She was hypomagnesemic and hypokalemic and has been repleted.  Further evaluation per the EP service.  Dr. Lars Mage has been notified.  The sheath was removed and a TR band was placed on the right wrist to achieve patent hemostasis.  The patient left lab in stable condition   12/12/20; TTE IMPRESSIONS   1. Left ventricular ejection fraction, by estimation, is 60 to 65%. The  left ventricle has normal function. The left ventricle has no regional   wall motion abnormalities. Left ventricular diastolic parameters are  consistent with Grade I diastolic  dysfunction (impaired relaxation).   2. Right ventricular systolic function is normal. The right ventricular  size is normal.   3. Left atrial size was mild to moderately dilated.   4. The mitral valve is normal in structure. No evidence of mitral valve  regurgitation. No evidence of mitral stenosis.   5. The aortic valve is tricuspid. There is mild calcification of the  aortic valve. Aortic valve regurgitation is not visualized. Mild to  moderate aortic valve sclerosis/calcification is present, without any  evidence of aortic stenosis.   6. The inferior vena cava is normal in size with greater than 50%  respiratory variability, suggesting right atrial pressure of 3 mmHg.     Patient Profile     77 y.o. 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 Quentin Ore discussed at length today with the patient, no good alternative AAD options, she understands, willing to try Re-trying amiodarone without IV load Tolerated dose so far (one), no new/worsened symptoms  RA Fibromyalgia Continue home meds/regime Chronic opioid use   HTN Relative hypotension with marked orthostatics yesterday Tolerating up-titration on BB   5. Weakness, falls of late +/- orthostatic and or VT PT consult today, mobilize   Daughter at bedside, very concerned about  discharging her before she is ready/able Anticipate tomorrow timing   For questions or updates, please contact [place] Please consult [email or web address] for contact info under        Signed, Baldwin Jamaica, PA-C  05/15/2022, 8:07 AM

## 2022-05-15 NOTE — Evaluation (Signed)
Physical Therapy Evaluation/ Discharge Patient Details Name: Kelsey Diaz MRN: [id number] DOB: Sep 01, 1945 Today's Date: 05/15/2022  History of Present Illness  77 yo female admitted 3/20 with Vtach, PMHx: COPD, HTN, fibromyalgia, RA, OSA, CAD  Clinical Impression  Pt pleasant with daughter in room and requesting evaluation prior to home. Pt lives with another daughter and normally uses a cane but has extensive DME available at home. Pt reports with periods of Vtach at home she has felt dizzy and fallen. HR during session 55-72 at rest seated and up to 85 with gait with SOB, dizziness or fatigue and activity limited only by pain in her feet. Pt educated for increased walking program and activity at home to maximize function and activity tolerance. Daughter reports plan to hopefully have them both involved in water aerobics soon. Pt at functional baseline without need for DME or further acute therapy at this time. Will sign off and encouraged daily ambulation acutely will defer to mobility specialist.       Recommendations for follow up therapy are one component of a multi-disciplinary discharge planning process, led by the attending physician.  Recommendations may be updated based on patient status, additional functional criteria and insurance authorization.  Follow Up Recommendations No PT follow up      Assistance Recommended at Discharge PRN  Patient can return home with the following  Assist for transportation;Assistance with cooking/housework    Equipment Recommendations None recommended by PT  Recommendations for Other Services       Functional Status Assessment Patient has not had a recent decline in their functional status     Precautions / Restrictions Precautions Precautions: Fall      Mobility  Bed Mobility               General bed mobility comments: in chair on arrival and end of session    Transfers Overall transfer level: Modified independent                       Ambulation/Gait Ambulation/Gait assistance: Supervision Gait Distance (Feet): 200 Feet Assistive device: IV Pole, Rolling walker (2 wheels), 1 person hand held assist Gait Pattern/deviations: Step-through pattern, Decreased stride length   Gait velocity interpretation: >2.62 ft/sec, indicative of community ambulatory   General Gait Details: Pt initially started gait with IV pole to partially simulate support of cane at home then transitioned to single HHA due to pole in the way of her feet after grossly 50'. At grossly 120' pt reported pain in her feet requiring HHA and intermittent support of rail in hallway. Last 20' pt ambulated with RW. Pt encouraged to use rollator for mobility in event of fatigue to have somewhere to sit and bil UE support  Stairs            Wheelchair Mobility    Modified Rankin (Stroke Patients Only)       Balance Overall balance assessment: Mild deficits observed, not formally tested                                           Pertinent Vitals/Pain Pain Assessment Pain Assessment: 0-10 Pain Score: 4  Pain Location: bil feet Pain Descriptors / Indicators: Aching Pain Intervention(s): Limited activity within patient's tolerance, Repositioned, Monitored during session    Home Living Family/patient expects to be discharged to:: Private residence Living Arrangements: Children Available  Help at Discharge: Available 24 hours/day Type of Home: Mobile home Home Access: Ramped entrance       Home Layout: One level Home Equipment: Conservation officer, nature (2 wheels);Rollator (4 wheels);BSC/3in1;Wheelchair - manual;Cane - single point;Shower seat;Grab bars - toilet;Grab bars - tub/shower      Prior Function Prior Level of Function : Independent/Modified Independent             Mobility Comments: walks with cane normally, doesn't drive, limited community mobility       Hand Dominance        Extremity/Trunk  Assessment   Upper Extremity Assessment Upper Extremity Assessment: Overall WFL for tasks assessed    Lower Extremity Assessment Lower Extremity Assessment: Overall WFL for tasks assessed    Cervical / Trunk Assessment Cervical / Trunk Assessment: Normal  Communication   Communication: No difficulties  Cognition Arousal/Alertness: Awake/alert Behavior During Therapy: WFL for tasks assessed/performed Overall Cognitive Status: Within Functional Limits for tasks assessed                                          General Comments      Exercises     Assessment/Plan    PT Assessment Patient does not need any further PT services  PT Problem List         PT Treatment Interventions      PT Goals (Current goals can be found in the Care Plan section)  Acute Rehab PT Goals PT Goal Formulation: All assessment and education complete, DC therapy    Frequency       Co-evaluation               AM-PAC PT "6 Clicks" Mobility  Outcome Measure Help needed turning from your back to your side while in a flat bed without using bedrails?: None Help needed moving from lying on your back to sitting on the side of a flat bed without using bedrails?: None Help needed moving to and from a bed to a chair (including a wheelchair)?: None Help needed standing up from a chair using your arms (e.g., wheelchair or bedside chair)?: None Help needed to walk in hospital room?: A Little Help needed climbing 3-5 steps with a railing? : A Little 6 Click Score: 22    End of Session   Activity Tolerance: Patient tolerated treatment well Patient left: in chair;with call bell/phone within reach;with family/visitor present Nurse Communication: Mobility status PT Visit Diagnosis: Other abnormalities of gait and mobility (R26.89)    Time: [id number] PT Time Calculation (min) (ACUTE ONLY): 25 min   Charges:   PT Evaluation $PT Eval Low Complexity: 1 Low          Waldo,  PT Acute Rehabilitation Services Office: [place] [facility] [place] 05/15/2022, 1:30 PM

## 2022-05-15 NOTE — Plan of Care (Signed)

## 2022-05-16 DIAGNOSIS — I472 Ventricular tachycardia, unspecified: Secondary | ICD-10-CM | POA: Diagnosis not present

## 2022-05-16 MED ORDER — AMIODARONE HCL 200 MG PO TABS: 200.0000 mg | ORAL_TABLET | Freq: Every day | ORAL | 3 refills | Status: DC

## 2022-05-16 MED ORDER — METOPROLOL SUCCINATE ER 50 MG PO TB24: 50.0000 mg | ORAL_TABLET | Freq: Two times a day (BID) | ORAL | 3 refills | Status: DC

## 2022-05-16 NOTE — Plan of Care (Signed)

## 2022-05-16 NOTE — Progress Notes (Signed)
   Rounding Note    Patient Name: Kelsey Diaz Date of Encounter: 05/16/2022  [place] Cardiologist: Minus Breeding, MD   Subjective   NAEO. Doing well. No dizziness while ambulating.    Vital Signs    Vitals:   05/15/22 1931 05/15/22 1957 05/15/22 2149 05/16/22 0454  BP:  (!) 145/66 (!) 145/66 (!) 143/91  Pulse: (!) 59 (!) 56 (!) 56 (!) 53  Resp: 16 18  16   Temp:  98.4 F (36.9 C)  98.6 F (37 C)  TempSrc:  Oral  Oral  SpO2:  98%  97%  Weight:    90.8 kg  Height:        Intake/Output Summary (Last 24 hours) at 05/16/2022 0757 Last data filed at 05/16/2022 0455 Gross per 24 hour  Intake 3119 ml  Output 2500 ml  Net 619 ml      05/16/2022    4:54 AM 05/15/2022    4:40 AM 05/14/2022    3:54 AM  Last 3 Weights  Weight (lbs) 200 lb 2.8 oz 204 lb 2.3 oz 203 lb 14.8 oz  Weight (kg) 90.8 kg 92.6 kg 92.5 kg      Telemetry    Sinus bradycardia. No VT/NSVT - Personally Reviewed   Physical Exam   GEN: No acute distress.   Cardiac: RRR, no murmurs, rubs, or gallops.  Respiratory: Clear to auscultation bilaterally. Psych: Normal affect    Assessment & Plan    #VT Likely LVOT. Hemodynamically tolerated. Continue amiodarone 400mg  PO daily x 10 days and then decrease to 200mg  PO daily thereafter. Continue metoprolol.   OK to discharge.     For questions or updates, please contact [place] Please consult [email or web address] for contact info under        Signed, Vickie Epley, MD  05/16/2022, 7:57 AM

## 2022-05-16 NOTE — Progress Notes (Signed)
Pt discharged. Went down in a wheelchair along with daughter and Therapist, sports. Pt A&)x4. Room air. Stable. No c/o pain/SOB.

## 2022-05-16 NOTE — Discharge Summary (Signed)
Discharge Summary    Patient ID: Kelsey Diaz MRN: [id number]; DOB: 1946/02/09  Admit date: 05/13/2022 Discharge date: 05/16/2022  PCP:  Harlan Stains, MD   [place] [facility] Cardiologist:  Minus Breeding, MD  Electrophysiologist:  Vickie Epley, MD       Discharge Diagnoses    Principal Problem:   Ventricular tachycardia [facility])    Diagnostic Studies/Procedures    None this admit _____________   History of Present Illness     Kelsey Diaz is a 77 y.o. 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 From RV outflow tract.   EP study 11/2020 could not induce even with isuprel. Cath 12/12/20 no CAD, EF normal by TTE 05/04/22 and MRI 12/13/20 only showed small area of mid myocardial gad uptake in septum which was non specific   After admit for near-syncope 03/11 monitor placed. It showed mult episodes NSVT, symptomatic, despite Mg & BB. She was admitted for further eval and treatment.   Hospital Course     Consultants: EP   On telemetry, she was having frequent runs of ventricular tachycardia, lasting as much as 3 minutes.  This was associated with shortness of breath and some weakness.  She was seen by EP who felt that her VT was originating in the outflow tracts, uncontrolled by magnesium supplementation and beta-blocker.  She had felt that amiodarone caused tremulousness and trouble thinking, but also reported that the symptoms did not change much after the amiodarone was stopped.  On telemetry she was having sustained monomorphic ventricular tachycardia with heart rates in the 140s.  There was an early precordial transition, consistent with LVOT.  The metoprolol was increased without significant improvement.  She was started on amiodarone at 400 mg daily for 7 days and then 200 mg daily thereafter.  The slower loading dose will minimize side effects.  At this time, no plans  for a repeat EP study but this can be reconsidered if the amiodarone is not effective.  The amiodarone was effective in controlling the VT.  She was agreeable to home health to help monitor her.  She was seen by mobility specialist and did pretty well, a physical therapy evaluation showed no need for additional therapies.  She has good family support.  On 3/23, she was evaluated by Dr. Quentin Ore and all data were reviewed.  She has a follow-up appointment in 2 weeks with Kelsey Diaz PAC.  Labs are below, her magnesium, potassium, and TSH are at acceptable levels.  No further inpatient workup is indicated and she is considered stable for discharge, to follow-up as an outpatient. _____________  Discharge Vitals Blood pressure (!) 143/91, pulse (!) 53, temperature 98.6 F (37 C), temperature source Oral, resp. rate 16, height 5\' 6"  (1.676 m), weight 90.8 kg, SpO2 97 %.  Filed Weights   05/14/22 0354 05/15/22 0440 05/16/22 0454  Weight: 92.5 kg 92.6 kg 90.8 kg    Labs & Radiologic Studies    CBC Recent Labs    05/13/22 1815 05/14/22 0028  WBC 6.9 5.1  NEUTROABS 2.9  --   HGB 14.3 12.6  HCT 43.9 37.3  MCV 93.2 90.8  PLT 187 [id number]   Basic Metabolic Panel Recent Labs    05/13/22 1815 05/14/22 0028  NA 134* 135  K 4.2 4.1  CL 97* 105  CO2 24 23  GLUCOSE 92 129*  BUN 19 15  CREATININE 0.84 0.79  CALCIUM 9.4 8.5*  MG 1.9 2.3   Liver Function Tests Recent Labs    05/13/22 1815  AST 30  ALT 19  ALKPHOS 56  BILITOT 0.8  PROT 6.9  ALBUMIN 3.7   Lab Results  Component Value Date   TSH 0.936 05/04/2022    High Sensitivity Troponin:   Recent Labs  Lab 05/03/22 1815 05/03/22 2005 05/13/22 1815 05/13/22 2046  TROPONINIHS 14 15 6 6      _____________  ECHOCARDIOGRAM COMPLETE  Result Date: 05/04/2022    ECHOCARDIOGRAM REPORT   Patient Name:   Kelsey Diaz Date of Exam: 05/04/2022 Medical Rec #:  [id number]        Height:       66.0 in Accession #:     [id number]       Weight:       195.8 lb Date of Birth:  09-03-1945       BSA:          1.982 m Patient Age:    52 years         BP:           119/69 mmHg Patient Gender: F                HR:           59 bpm. Exam Location:  Inpatient Procedure: 2D Echo, Cardiac Doppler and Color Doppler Indications:    Ventricular Tachycardia I47.2  History:        Patient has prior history of Echocardiogram examinations, most                 recent 12/12/2020. COPD, Arrythmias:Tachycardia,                 Signs/Symptoms:Chest Pain and Shortness of Breath; Risk                 Factors:Hypertension and Sleep Apnea.  Sonographer:    Ronny Flurry Referring Phys: [id number] [place] T TU IMPRESSIONS  1. Left ventricular ejection fraction, by estimation, is 55 to 60%. The left ventricle has normal function. The left ventricle has no regional wall motion abnormalities. There is mild asymmetric left ventricular hypertrophy of the basal-septal segment. Left ventricular diastolic parameters are consistent with Grade I diastolic dysfunction (impaired relaxation).  2. Right ventricular systolic function is normal. The right ventricular size is normal.  3. Left atrial size was mildly dilated.  4. The mitral valve is normal in structure. Trivial mitral valve regurgitation. No evidence of mitral stenosis.  5. The aortic valve is tricuspid. There is mild calcification of the aortic valve. Aortic valve regurgitation is not visualized. Aortic valve sclerosis/calcification is present, without any evidence of aortic stenosis. Aortic valve area, by VTI measures  3.20 cm. Aortic valve mean gradient measures 4.0 mmHg. Aortic valve Vmax measures 1.38 m/s.  6. The inferior vena cava is normal in size with greater than 50% respiratory variability, suggesting right atrial pressure of 3 mmHg. FINDINGS  Left Ventricle: Left ventricular ejection fraction, by estimation, is 55 to 60%. The left ventricle has normal function. The left ventricle has no regional  wall motion abnormalities. The left ventricular internal cavity size was normal in size. There is  mild asymmetric left ventricular hypertrophy of the basal-septal segment. Left ventricular diastolic parameters are consistent with Grade I diastolic dysfunction (impaired relaxation). Right Ventricle: The right ventricular size is normal. No increase in right ventricular wall thickness. Right ventricular systolic function  is normal. Left Atrium: Left atrial size was mildly dilated. Right Atrium: Right atrial size was normal in size. Pericardium: There is no evidence of pericardial effusion. Mitral Valve: The mitral valve is normal in structure. Trivial mitral valve regurgitation. No evidence of mitral valve stenosis. Tricuspid Valve: The tricuspid valve is normal in structure. Tricuspid valve regurgitation is trivial. No evidence of tricuspid stenosis. Aortic Valve: The aortic valve is tricuspid. There is mild calcification of the aortic valve. Aortic valve regurgitation is not visualized. Aortic valve sclerosis/calcification is present, without any evidence of aortic stenosis. Aortic valve mean gradient measures 4.0 mmHg. Aortic valve peak gradient measures 7.6 mmHg. Aortic valve area, by VTI measures 3.20 cm. Pulmonic Valve: The pulmonic valve was normal in structure. Pulmonic valve regurgitation is not visualized. No evidence of pulmonic stenosis. Aorta: The aortic root is normal in size and structure. Venous: The inferior vena cava is normal in size with greater than 50% respiratory variability, suggesting right atrial pressure of 3 mmHg. IAS/Shunts: No atrial level shunt detected by color flow Doppler.  LEFT VENTRICLE PLAX 2D LVIDd:         4.60 cm   Diastology LVIDs:         3.20 cm   LV e' medial:    4.35 cm/s LV PW:         1.10 cm   LV E/e' medial:  21.7 LV IVS:        1.30 cm   LV e' lateral:   10.20 cm/s LVOT diam:     2.30 cm   LV E/e' lateral: 9.3 LV SV:         104 LV SV Index:   52 LVOT Area:     4.15  cm  RIGHT VENTRICLE            IVC RV S prime:     8.38 cm/s  IVC diam: 1.40 cm TAPSE (M-mode): 2.1 cm LEFT ATRIUM             Index        RIGHT ATRIUM           Index LA diam:        3.40 cm 1.72 cm/m   RA Area:     13.50 cm LA Vol (A2C):   45.1 ml 22.76 ml/m  RA Volume:   24.10 ml  12.16 ml/m LA Vol (A4C):   50.7 ml 25.58 ml/m LA Biplane Vol: 48.7 ml 24.57 ml/m  AORTIC VALVE AV Area (Vmax):    3.55 cm AV Area (Vmean):   3.43 cm AV Area (VTI):     3.20 cm AV Vmax:           138.00 cm/s AV Vmean:          98.400 cm/s AV VTI:            0.324 m AV Peak Grad:      7.6 mmHg AV Mean Grad:      4.0 mmHg LVOT Vmax:         118.00 cm/s LVOT Vmean:        81.300 cm/s LVOT VTI:          0.249 m LVOT/AV VTI ratio: 0.77  AORTA Ao Root diam: 3.40 cm Ao Asc diam:  3.10 cm MITRAL VALVE MV Area (PHT): 2.95 cm    SHUNTS MV Decel Time: 257 msec    Systemic VTI:  0.25 m MV E velocity: 94.60 cm/s  Systemic Diam:  2.30 cm MV A velocity: 95.70 cm/s MV E/A ratio:  0.99 Glori Bickers MD Electronically signed by Glori Bickers MD Signature Date/Time: 05/04/2022/4:06:02 PM    Final    DG Chest Portable 1 View  Result Date: 05/03/2022 CLINICAL DATA:  Chest pain and palpitations EXAM: PORTABLE CHEST 1 VIEW COMPARISON:  Chest radiograph dated 04/04/2021 FINDINGS: Normal lung volumes. No focal consolidations. No pleural effusion or pneumothorax. The heart size and mediastinal contours are within normal limits. The visualized skeletal structures are unremarkable. Aortic atherosclerosis. IMPRESSION: No active disease. Electronically Signed   By: Darrin Nipper M.D.   On: 05/03/2022 18:37   Disposition   Pt is being discharged home today in good condition.  Follow-up Plans & Appointments     Discharge Instructions     Diet - low sodium heart healthy   Complete by: As directed    Increase activity slowly   Complete by: As directed         Discharge Medications   Allergies as of 05/16/2022       Reactions    Morphine Other (See Comments)   Stopped heart   Advair Diskus [fluticasone-salmeterol] Other (See Comments)   Sensitivity to smells   Ambien [zolpidem Tartrate] Other (See Comments)   irritable   Amlodipine Besylate Other (See Comments)   Feels bad   Azithromycin Other (See Comments)    Unknown   Bextra [valdecoxib] Other (See Comments)   Stomach issues   Bupropion Other (See Comments)   involuntary muscle movements   Captopril Other (See Comments)   Ache   Irbesartan Other (See Comments)   dizzy   Latex Itching   Bandaids only   Lisinopril Cough   Pacerone [amiodarone] Other (See Comments)   falls   Pulmicort [budesonide] Other (See Comments)   Worse breathing, sensitivity to smells   Savella [milnacipran Hcl] Other (See Comments)   Mental fog   Spiriva [tiotropium Bromide Monohydrate] Other (See Comments)   Dry mouth   Suvorexant Other (See Comments)    not effective   Tiotropium Bromide Monohydrate Other (See Comments)    dry mouth        Medication List     TAKE these medications    albuterol 0.63 MG/3ML nebulizer solution Commonly known as: ACCUNEB Take 1 ampule by nebulization every 6 (six) hours as needed for wheezing or shortness of breath.   albuterol 108 (90 Base) MCG/ACT inhaler Commonly known as: VENTOLIN HFA Inhale 2 puffs into the lungs as needed for shortness of breath.   amiodarone 200 MG tablet Commonly known as: PACERONE Take 1-2 tablets (200-400 mg total) by mouth daily. Take 2 tabs daily for 5 days, then 1 tab daily.   aspirin 81 MG chewable tablet Chew 81 mg by mouth daily.   Calcium Carbonate-Vitamin D 600-200 MG-UNIT Tabs Take 1 tablet by mouth daily.   Clobetasol Prop Emollient Base 0.05 % emollient cream Apply 1 application  topically 2 (two) times daily as needed (skin irritation).   CRANBERRY PO Take 1 tablet by mouth daily.   DULoxetine 60 MG capsule Commonly known as: CYMBALTA Take 60 mg by mouth 2 (two) times daily.    Enbrel 50 MG/ML injection Generic drug: etanercept Inject 50 mg into the skin every Wednesday.   EYE DROPS OP Place 2 drops into both eyes as needed (clear eyes).   FISH OIL PO Take 2 capsules by mouth daily.   fluticasone 50 MCG/ACT nasal spray Commonly known as: FLONASE Place 2  sprays into both nostrils at bedtime.   HYDROcodone-acetaminophen 7.5-325 MG tablet Commonly known as: NORCO Take 1 tablet by mouth every 6 (six) hours as needed for moderate pain or severe pain. What changed:  when to take this additional instructions   ibuprofen 200 MG tablet Commonly known as: ADVIL Take 400 mg by mouth as needed for headache or moderate pain.   magnesium oxide 400 (240 Mg) MG tablet Commonly known as: MAG-OX Take 1 tablet (400 mg total) by mouth daily.   meloxicam 15 MG tablet Commonly known as: MOBIC Take 15 mg by mouth daily.   metoprolol succinate 50 MG 24 hr tablet Commonly known as: Toprol XL Take 1 tablet (50 mg total) by mouth 2 (two) times daily. What changed:  medication strength how much to take when to take this   MULTI-VITAMIN PO Take 1 tablet by mouth daily.   omeprazole 40 MG capsule Commonly known as: PRILOSEC Take 40 mg by mouth daily.   Osphena 60 MG Tabs Generic drug: Ospemifene Take 60 mg by mouth daily.   rosuvastatin 20 MG tablet Commonly known as: CRESTOR Take 20 mg by mouth every Monday.   Stiolto Respimat 2.5-2.5 MCG/ACT Aers Generic drug: Tiotropium Bromide-Olodaterol Inhale 2 puffs into the lungs daily. What changed:  how much to take when to take this           Outstanding Labs/Studies   None  Duration of Discharge Encounter   Greater than 30 minutes including physician time.  Signed, Rosaria Ferries, PA-C 05/16/2022, 8:20 AM

## 2022-05-25 NOTE — Progress Notes (Unsigned)
  Cardiology Office Note:   Date:  05/26/2022  ID:  Kelsey Diaz, DOB 10/20/1945, MRN [id number]  Primary Cardiologist: Minus Breeding, MD Electrophysiologist: Vickie Epley, MD   History of Present Illness:   Kelsey Diaz is a 77 y.o. female with a history of RA, FM, Non-obstructive CAD, OSA on CPAP, HTN, and NSVT/VT  seen today for post hospital follow up.    Admitted 3/10 - 3/11 with near syncope. Found to have runs of NSVT on telemetry. Work up was unrevealing other than Mg of 1.6 which was supped, felt to potentially be due to laxative/fiber use.  BB increased.   Seen in office 3/20 and having dizzy spells but also very orthostatic. Meds adjusted and monitor placed which almost immediately showed runs of sustained VT. Pt sent to ED.   Pt admitted 3/20 - 3/23, amiodarone was loaded and metoprolol increased as tolerated which improved burden.  She had "neurologic side effects" in the past reported as tremulousness and trouble thinking, but also reported that the symptoms did not change much after the amiodarone was stopped   Today she is doing OK. Remains fatigue and having some SOB with more than mild exertion. She had a fall getting out of the shower last night, but felt like it was due to LE pain and weakness, which pre-dated this recent admission. No syncope.   Review of systems complete and found to be negative unless listed in HPI.    AAD History On amiodarone 10/22 - 12/22 -> stopped with neuropathy/dizziness/shaking/forgetfulness -> symptoms did NOT improve Amiodarone resumed 05/14/22  Studies Reviewed:    EKG today shows sinus bradycardia at 50 bpm. No PVCs on EKG or 30 second strip.   Risk Assessment/Calculations:       Physical Exam:   VS:  BP 114/62   Pulse (!) 50   Ht 5\' 7"  (1.702 m)   Wt 201 lb 12.8 oz (91.5 kg)   SpO2 98%   BMI 31.61 kg/m    Wt Readings from Last 3 Encounters:  05/26/22 201 lb 12.8 oz (91.5 kg)  05/16/22 200 lb 2.8 oz (90.8 kg)   05/13/22 195 lb (88.5 kg)    No data found.  GEN: Well nourished, well developed in no acute distress NECK: No JVD; No carotid bruits CARDIAC: Regularly irregular, due to bigeminy, no murmurs, rubs, gallops RESPIRATORY:  Clear to auscultation without rales, wheezing or rhonchi  ABDOMEN: Soft, non-tender, non-distended EXTREMITIES:  No edema; No deformity    ASSESSMENT AND PLAN:   Ventricular tachycardia/ NSVT Recent episodes her first (symptomatic) episodes since coming off amiodarone 01/2021 Echo 05/04/2022 LVEF 55-60%, Grade 1 DD, mild LAE, trivial MV cMRI 11/2020 EF 53%, mild mid myocardial gad uptake slightly worse in basal septum, non-specific.  Not inducible with prior EP study 11/2020 Readmitted 05/13/22 with frequent episodes on zio that was placed that day.  Continue amiodarone 200 mg daily.  Continue toprol 50 mg BID Consider mexitil if recurs. I worry about dizziness.  Continue Mag ox 400 mg daily.  Labs today.    H/o amiodarone associated neuropathy Pt had previously reported tremulousness and trouble thinking, but also reported that the symptoms did not change much after the amiodarone was stopped  Now being re-challenged. Follow closely.    Chronic fatigue Multifactorial  Orthostatics Improved off valsartan and with better control of her VT.   Follow up with Dr. Quentin Ore or EP APP in 4 weeks Signed, Shirley Friar, PA-C

## 2022-05-26 ENCOUNTER — Ambulatory Visit: Payer: Medicare HMO | Attending: Student | Admitting: Student

## 2022-05-26 ENCOUNTER — Encounter: Payer: Self-pay | Admitting: Student

## 2022-05-26 VITALS — BP 114/62 | HR 50 | Ht 67.0 in | Wt 201.8 lb

## 2022-05-26 DIAGNOSIS — I10 Essential (primary) hypertension: Secondary | ICD-10-CM | POA: Diagnosis not present

## 2022-05-26 DIAGNOSIS — I472 Ventricular tachycardia, unspecified: Secondary | ICD-10-CM | POA: Diagnosis not present

## 2022-05-26 DIAGNOSIS — R002 Palpitations: Secondary | ICD-10-CM | POA: Diagnosis not present

## 2022-05-26 DIAGNOSIS — R55 Syncope and collapse: Secondary | ICD-10-CM

## 2022-05-26 NOTE — Patient Instructions (Signed)
Medication Instructions:  Your physician recommends that you continue on your current medications as directed. Please refer to the Current Medication list given to you today.  *If you need a refill on your cardiac medications before your next appointment, please call your pharmacy*  Lab Work: BMET, MAG--TODAY If you have labs (blood work) drawn today and your tests are completely normal, you will receive your results only by: [place] (if you have MyChart) OR A paper copy in the mail If you have any lab test that is abnormal or we need to change your treatment, we will call you to review the results.  Follow-Up: At [facility] ([facility] you and your health needs are our priority.  As part of our continuing mission to provide you with exceptional heart care, we have created designated Provider Care Teams.  These Care Teams include your primary Cardiologist (physician) and Advanced Practice Providers (APPs -  Physician Assistants and Nurse Practitioners) who all work together to provide you with the care you need, when you need it.  Your next appointment:   1 month(s)  Provider:   Lars Mage, MD or Beryle Beams" [place], [place]

## 2022-05-27 ENCOUNTER — Other Ambulatory Visit: Payer: Self-pay | Admitting: Family Medicine

## 2022-05-27 DIAGNOSIS — Z1231 Encounter for screening mammogram for malignant neoplasm of breast: Secondary | ICD-10-CM

## 2022-05-27 LAB — BASIC METABOLIC PANEL
BUN/Creatinine Ratio: 22 (ref 12–28)
BUN: 25 mg/dL (ref 8–27)
CO2: 22 mmol/L (ref 20–29)
Calcium: 9 mg/dL (ref 8.7–10.3)
Chloride: 98 mmol/L (ref 96–106)
Creatinine, Ser: 1.12 mg/dL — ABNORMAL HIGH (ref 0.57–1.00)
Glucose: 93 mg/dL (ref 70–99)
Potassium: 4.7 mmol/L (ref 3.5–5.2)
Sodium: 135 mmol/L (ref 134–144)
eGFR: 51 mL/min/{1.73_m2} — ABNORMAL LOW (ref >59)

## 2022-05-27 LAB — MAGNESIUM: Magnesium: 2 mg/dL (ref 1.6–2.3)

## 2022-06-01 DIAGNOSIS — E1169 Type 2 diabetes mellitus with other specified complication: Secondary | ICD-10-CM | POA: Diagnosis not present

## 2022-06-01 DIAGNOSIS — E785 Hyperlipidemia, unspecified: Secondary | ICD-10-CM | POA: Diagnosis not present

## 2022-06-01 DIAGNOSIS — J449 Chronic obstructive pulmonary disease, unspecified: Secondary | ICD-10-CM | POA: Diagnosis not present

## 2022-06-01 DIAGNOSIS — I10 Essential (primary) hypertension: Secondary | ICD-10-CM | POA: Diagnosis not present

## 2022-06-12 DIAGNOSIS — Z201 Contact with and (suspected) exposure to tuberculosis: Secondary | ICD-10-CM | POA: Diagnosis not present

## 2022-06-23 ENCOUNTER — Ambulatory Visit: Payer: Medicare HMO | Attending: Anesthesiology | Admitting: Anesthesiology

## 2022-06-23 ENCOUNTER — Encounter: Payer: Self-pay | Admitting: Anesthesiology

## 2022-06-23 DIAGNOSIS — M4716 Other spondylosis with myelopathy, lumbar region: Secondary | ICD-10-CM

## 2022-06-23 DIAGNOSIS — M5136 Other intervertebral disc degeneration, lumbar region: Secondary | ICD-10-CM

## 2022-06-23 DIAGNOSIS — M47817 Spondylosis without myelopathy or radiculopathy, lumbosacral region: Secondary | ICD-10-CM

## 2022-06-23 DIAGNOSIS — M0579 Rheumatoid arthritis with rheumatoid factor of multiple sites without organ or systems involvement: Secondary | ICD-10-CM | POA: Diagnosis not present

## 2022-06-23 DIAGNOSIS — M25551 Pain in right hip: Secondary | ICD-10-CM | POA: Diagnosis not present

## 2022-06-23 DIAGNOSIS — M79642 Pain in left hand: Secondary | ICD-10-CM

## 2022-06-23 DIAGNOSIS — M5431 Sciatica, right side: Secondary | ICD-10-CM

## 2022-06-23 DIAGNOSIS — G894 Chronic pain syndrome: Secondary | ICD-10-CM

## 2022-06-23 DIAGNOSIS — F1190 Opioid use, unspecified, uncomplicated: Secondary | ICD-10-CM

## 2022-06-23 DIAGNOSIS — M25552 Pain in left hip: Secondary | ICD-10-CM

## 2022-06-23 DIAGNOSIS — M797 Fibromyalgia: Secondary | ICD-10-CM

## 2022-06-23 DIAGNOSIS — M51369 Other intervertebral disc degeneration, lumbar region without mention of lumbar back pain or lower extremity pain: Secondary | ICD-10-CM

## 2022-06-23 DIAGNOSIS — M79641 Pain in right hand: Secondary | ICD-10-CM

## 2022-06-23 MED ORDER — HYDROCODONE-ACETAMINOPHEN 7.5-325 MG PO TABS: 1.0000 | ORAL_TABLET | Freq: Four times a day (QID) | ORAL | 0 refills | Status: AC | PRN

## 2022-06-23 MED ORDER — HYDROCODONE-ACETAMINOPHEN 7.5-325 MG PO TABS: 1.0000 | ORAL_TABLET | Freq: Four times a day (QID) | ORAL | 0 refills | Status: DC | PRN

## 2022-06-23 NOTE — Progress Notes (Signed)
Virtual Visit via Telephone Note  I connected with Kelsey Diaz on 06/23/22 at  1:40 PM EDT by telephone and verified that I am speaking with the correct person using two identifiers.  Location: Patient: Home Provider: Pain control center   I discussed the limitations, risks, security and privacy concerns of performing an evaluation and management service by telephone and the availability of in person appointments. I also discussed with the patient that there may be a patient responsible charge related to this service. The patient expressed understanding and agreed to proceed.   History of Present Illness: I spoke with Kelsey Diaz via telephone as we were unable to link for the video portion of the conference.  She reports that she has been doing reasonably well following her epidural steroid injection.  Her back pain has improved and she has gotten rid of most of her left sciatica symptoms but she is experiencing a fair amount of right side calf and foot pain consistent with ongoing right side sciatica.  Unfortunately she has also had problems with a recent diagnosis of intermittent ventricular tachycardia causing some syncopal episodes with her blacking out.  She is under the care of her cardiologist for this and has recently started a blood thinner.  She is taking her medications as prescribed and these continue to help with chronic pain that she experiences and these continue to give her good relief enable her to stay comfortable and sleep better at night.  No side effects are reported.  Otherwise no change in lower extremity strength or function she feels that that she did get about 75 to 80% relief of her low back pain over the course of the last 2 months and significant improvement in her left side sciatica.  Her primary pain at this point is continuing right side sciatica pain. Review of systems: General: No fevers or chills Pulmonary: No shortness of breath or dyspnea Cardiac: No  angina or palpitations or lightheadedness GI: No abdominal pain or constipation Psych: No depression     Observations/Objective:  Current Outpatient Medications:    [START ON 06/26/2022] HYDROcodone-acetaminophen (NORCO) 7.5-325 MG tablet, Take 1 tablet by mouth every 6 (six) hours as needed for moderate pain or severe pain., Disp: 120 tablet, Rfl: 0   [START ON 07/26/2022] HYDROcodone-acetaminophen (NORCO) 7.5-325 MG tablet, Take 1 tablet by mouth every 6 (six) hours as needed for moderate pain or severe pain., Disp: 120 tablet, Rfl: 0   albuterol (ACCUNEB) 0.63 MG/3ML nebulizer solution, Take 1 ampule by nebulization every 6 (six) hours as needed for wheezing or shortness of breath., Disp: , Rfl:    albuterol (VENTOLIN HFA) 108 (90 Base) MCG/ACT inhaler, Inhale 2 puffs into the lungs as needed for shortness of breath., Disp: , Rfl:    amiodarone (PACERONE) 200 MG tablet, Take 1-2 tablets (200-400 mg total) by mouth daily. Take 2 tabs daily for 5 days, then 1 tab daily., Disp: 36 tablet, Rfl: 3   aspirin 81 MG chewable tablet, Chew 81 mg by mouth daily., Disp: , Rfl:    Calcium Carbonate-Vitamin D 600-200 MG-UNIT TABS, Take 1 tablet by mouth daily. , Disp: , Rfl:    Carboxymethylcellulose Sodium (EYE DROPS OP), Place 2 drops into both eyes as needed (clear eyes)., Disp: , Rfl:    Clobetasol Prop Emollient Base 0.05 % emollient cream, Apply 1 application  topically 2 (two) times daily as needed (skin irritation)., Disp: , Rfl:    CRANBERRY PO, Take 1 tablet by mouth  daily., Disp: , Rfl:    DULoxetine (CYMBALTA) 60 MG capsule, Take 60 mg by mouth 2 (two) times daily., Disp: , Rfl:    etanercept (ENBREL) 50 MG/ML injection, Inject 50 mg into the skin every Wednesday., Disp: , Rfl:    fluticasone (FLONASE) 50 MCG/ACT nasal spray, Place 2 sprays into both nostrils at bedtime., Disp: , Rfl:    ibuprofen (ADVIL) 200 MG tablet, Take 400 mg by mouth as needed for headache or moderate pain., Disp: , Rfl:     magnesium oxide (MAG-OX) 400 (240 Mg) MG tablet, Take 1 tablet (400 mg total) by mouth daily., Disp: 30 tablet, Rfl: 1   meloxicam (MOBIC) 15 MG tablet, Take 15 mg by mouth daily., Disp: , Rfl:    metoprolol succinate (TOPROL XL) 50 MG 24 hr tablet, Take 1 tablet (50 mg total) by mouth 2 (two) times daily., Disp: 60 tablet, Rfl: 3   Multiple Vitamin (MULTI-VITAMIN PO), Take 1 tablet by mouth daily. , Disp: , Rfl:    Omega-3 Fatty Acids (FISH OIL PO), Take 2 capsules by mouth daily., Disp: , Rfl:    omeprazole (PRILOSEC) 40 MG capsule, Take 40 mg by mouth daily., Disp: , Rfl: 1   OSPHENA 60 MG TABS, Take 60 mg by mouth daily., Disp: , Rfl:    rosuvastatin (CRESTOR) 20 MG tablet, Take 20 mg by mouth every Monday., Disp: , Rfl:    Tiotropium Bromide-Olodaterol (STIOLTO RESPIMAT) 2.5-2.5 MCG/ACT AERS, Inhale 2 puffs into the lungs daily. (Patient taking differently: Inhale 1 puff into the lungs in the morning and at bedtime.), Disp: 4 g, Rfl: 11  Past Medical History:  Diagnosis Date   Allergic rhinitis    Anxiety    Asthma    Bilateral hip pain 11/28/2018   Colitis    COPD (chronic obstructive pulmonary disease) (HCC)    Fibromyalgia 05/16/2019   GERD (gastroesophageal reflux disease)    With esophageal strictures   Hypertension    Hyperthyroidism    OSA (obstructive sleep apnea) 02/09/2012   No mask.  Did not tolerate   Osteoarthritis    Rheumatoid arthritis (HCC) 10/03/2018     Assessment and Plan: 1. DDD (degenerative disc disease), lumbar   2. Lumbar spondylosis with myelopathy   3. Bilateral hip pain   4. Chronic, continuous use of opioids   5. Chronic pain syndrome   6. Rheumatoid arthritis involving multiple sites with positive rheumatoid factor (HCC)   7. Fibromyalgia   8. Facet arthritis of lumbosacral region   9. Pain in both hands   10. Right sided sciatica   Based on our conversation today and after review of the [facility] practitioner database information it is  appropriate to refill her medicines for the next 2 months dated from May 3 and June 2.  No other changes in her pharmacologic regimen will be initiated.  I want her to continue follow-up with her cardiologist for the V. tach issue.  She would be a candidate for repeat epidural as she did have a favorable response to therapy however we will need her off of her blood thinners prior to that.  She will defer at this point till her cardiac situation is stabilized.  We will schedule her for 9-month return to clinic and continue with the current medication management at present.  Continue follow-up with her primary care physicians for baseline medical care  Follow Up Instructions:    I discussed the assessment and treatment plan with the patient.  The patient was provided an opportunity to ask questions and all were answered. The patient agreed with the plan and demonstrated an understanding of the instructions.   The patient was advised to call back or seek an in-person evaluation if the symptoms worsen or if the condition fails to improve as anticipated.  I provided 30 minutes of non-face-to-face time during this encounter.   Yevette Edwards, MD

## 2022-06-24 NOTE — Progress Notes (Unsigned)
Electrophysiology Office Note:   Date:  06/25/2022  ID:  Kelsey, Diaz 02/09/1946, MRN [id number]  Primary Cardiologist: Rollene Rotunda, MD Electrophysiologist: Lanier Prude, MD   History of Present Illness:   Kelsey Diaz is a 77 y.o. female with history of RA, FM, Non-obstructive CAD, OSA on CPAP, HTN, and NSVT/VT seen today for routine follow up.    Admitted 3/10 - 3/11 with near syncope. Found to have runs of NSVT on telemetry. Work up was unrevealing other than Mg of 1.6 which was supped, felt to potentially be due to laxative/fiber use.  BB increased.    Seen in office 3/20 and having dizzy spells but also very orthostatic. Meds adjusted and monitor placed which almost immediately showed runs of sustained VT. Pt sent to ED.    Pt admitted 3/20 - 3/23, amiodarone was loaded and metoprolol increased as tolerated which improved burden.  She had "neurologic side effects" in the past reported as tremulousness and trouble thinking, but also reported that the symptoms did not change much after the amiodarone was stopped    Seen 4/2 and was overall doing well.   Seen today for routine electrophysiology followup. Since last being seen in our clinic the patient reports doing about the same.   She has had some fatigue.  When pressed, she feels like her dizziness has gotten much better, but her fatigue has gotten slightly worse since starting on amiodarone. She understands she's not on much for blood pressure, and the meds she's on also help with her VT.   Review of systems complete and found to be negative unless listed in HPI.   Studies Reviewed:    EKG is ordered today. Personal review shows NSr at 65 bpm, bifascicular block  Physical Exam:   VS:  BP 136/80   Pulse 65   Ht 5\' 7"  (1.702 m)   Wt 201 lb (91.2 kg)   SpO2 95%   BMI 31.48 kg/m    Wt Readings from Last 3 Encounters:  06/25/22 201 lb (91.2 kg)  05/26/22 201 lb 12.8 oz (91.5 kg)  05/16/22 200 lb 2.8 oz (90.8  kg)     GEN: Well nourished, well developed in no acute distress NECK: No JVD; No carotid bruits CARDIAC: Regular rate and rhythm, no murmurs, rubs, gallops RESPIRATORY:  Clear to auscultation without rales, wheezing or rhonchi  ABDOMEN: Soft, non-tender, non-distended EXTREMITIES:  No edema; No deformity   ASSESSMENT AND PLAN:   Ventricular tachycardia/ NSVT Recent episodes her first (symptomatic) episodes since coming off amiodarone 01/2021 Echo 05/04/2022 LVEF 55-60%, Grade 1 DD, mild LAE, trivial MV cMRI 11/2020 EF 53%, mild mid myocardial gad uptake slightly worse in basal septum, non-specific.  Not inducible with prior EP study 11/2020 Readmitted 05/13/22 with frequent episodes on zio that was placed that day.  Continue amiodarone 200 mg daily.  Continue toprol 50 mg BID Consider mexitil if recurs. Worry about her dizziness.  Continue Mag ox 400 mg daily.  Surveillance labs today.    H/o amiodarone associated neuropathy Pt had previously reported tremulousness and trouble thinking, but also reported that the symptoms did not change much after the amiodarone was stopped  Follow closely with re being re-challenged. Follow closely.    Chronic fatigue Multifactorial Somewhat worsened after recent med changes. We discussed a trial of either decreased amiodarone or toprol, nothing that a decrease of either could potentially make her feel worse in other days.  Thinking back, she hasn't not been  very active in about 3 years, even more so since recent issues. For now, she is going to focus on becoming more active and seeing if this helps.    Orthostatics Improved off valsartan and with better control of her VT.   Follow up with Dr. Lalla Brothers in 3 months  Signed, Graciella Freer, PA-C

## 2022-06-25 ENCOUNTER — Encounter: Payer: Self-pay | Admitting: Student

## 2022-06-25 ENCOUNTER — Ambulatory Visit: Payer: Medicare HMO | Attending: Student | Admitting: Student

## 2022-06-25 VITALS — BP 136/80 | HR 65 | Ht 67.0 in | Wt 201.0 lb

## 2022-06-25 DIAGNOSIS — I10 Essential (primary) hypertension: Secondary | ICD-10-CM

## 2022-06-25 DIAGNOSIS — Z79899 Other long term (current) drug therapy: Secondary | ICD-10-CM

## 2022-06-25 DIAGNOSIS — R002 Palpitations: Secondary | ICD-10-CM | POA: Diagnosis not present

## 2022-06-25 DIAGNOSIS — I472 Ventricular tachycardia, unspecified: Secondary | ICD-10-CM

## 2022-06-25 LAB — COMPREHENSIVE METABOLIC PANEL
ALT: 18 IU/L (ref 0–32)
AST: 24 IU/L (ref 0–40)
Albumin/Globulin Ratio: 1.6 (ref 1.2–2.2)
Albumin: 4.1 g/dL (ref 3.8–4.8)
Alkaline Phosphatase: 54 IU/L (ref 44–121)
BUN/Creatinine Ratio: 17 (ref 12–28)
BUN: 15 mg/dL (ref 8–27)
Bilirubin Total: 0.6 mg/dL (ref 0.0–1.2)
CO2: 23 mmol/L (ref 20–29)
Calcium: 9.6 mg/dL (ref 8.7–10.3)
Chloride: 101 mmol/L (ref 96–106)
Creatinine, Ser: 0.88 mg/dL (ref 0.57–1.00)
Globulin, Total: 2.6 g/dL (ref 1.5–4.5)
Glucose: 103 mg/dL — ABNORMAL HIGH (ref 70–99)
Potassium: 4.4 mmol/L (ref 3.5–5.2)
Sodium: 137 mmol/L (ref 134–144)
Total Protein: 6.7 g/dL (ref 6.0–8.5)
eGFR: 68 mL/min/{1.73_m2} (ref >59)

## 2022-06-25 LAB — TSH: TSH: 6.67 u[IU]/mL — ABNORMAL HIGH (ref 0.450–4.500)

## 2022-06-25 LAB — T4, FREE: Free T4: 1.34 ng/dL (ref 0.82–1.77)

## 2022-06-25 NOTE — Patient Instructions (Signed)
Medication Instructions:  Your physician recommends that you continue on your current medications as directed. Please refer to the Current Medication list given to you today.  *If you need a refill on your cardiac medications before your next appointment, please call your pharmacy*  Lab Work: CMET, TSH, FreeT4--TODAY If you have labs (blood work) drawn today and your tests are completely normal, you will receive your results only by: MyChart Message (if you have MyChart) OR A paper copy in the mail If you have any lab test that is abnormal or we need to change your treatment, we will call you to review the results.  Follow-Up: At [facility], you and your health needs are our priority.  As part of our continuing mission to provide you with exceptional heart care, we have created designated Provider Care Teams.  These Care Teams include your primary Cardiologist (physician) and Advanced Practice Providers (APPs -  Physician Assistants and Nurse Practitioners) who all work together to provide you with the care you need, when you need it.  Your next appointment:   3 month(s)  Provider:   Steffanie Dunn, MD

## 2022-07-01 DIAGNOSIS — I10 Essential (primary) hypertension: Secondary | ICD-10-CM | POA: Diagnosis not present

## 2022-07-01 DIAGNOSIS — E1169 Type 2 diabetes mellitus with other specified complication: Secondary | ICD-10-CM | POA: Diagnosis not present

## 2022-07-01 DIAGNOSIS — J449 Chronic obstructive pulmonary disease, unspecified: Secondary | ICD-10-CM | POA: Diagnosis not present

## 2022-07-09 ENCOUNTER — Telehealth: Payer: Self-pay | Admitting: Cardiology

## 2022-07-09 ENCOUNTER — Other Ambulatory Visit: Payer: Self-pay | Admitting: *Deleted

## 2022-07-09 MED ORDER — AMIODARONE HCL 200 MG PO TABS: 200.0000 mg | ORAL_TABLET | Freq: Every day | ORAL | 3 refills | Status: DC

## 2022-07-09 NOTE — Telephone Encounter (Signed)
*  STAT* If patient is at the pharmacy, call can be transferred to refill team.   1. Which medications need to be refilled? (please list name of each medication and dose if known) amiodarone (PACERONE) 200 MG tablet  2. Which pharmacy/location (including street and city if local pharmacy) is medication to be sent to? [facility] - [place], [place] - [address]  3. Do they need a 30 day or 90 day supply? 90  

## 2022-07-29 ENCOUNTER — Other Ambulatory Visit: Payer: Self-pay | Admitting: Pulmonary Disease

## 2022-07-31 DIAGNOSIS — I10 Essential (primary) hypertension: Secondary | ICD-10-CM | POA: Diagnosis not present

## 2022-07-31 DIAGNOSIS — E1169 Type 2 diabetes mellitus with other specified complication: Secondary | ICD-10-CM | POA: Diagnosis not present

## 2022-07-31 DIAGNOSIS — J449 Chronic obstructive pulmonary disease, unspecified: Secondary | ICD-10-CM | POA: Diagnosis not present

## 2022-08-05 ENCOUNTER — Ambulatory Visit
Admission: RE | Admit: 2022-08-05 | Discharge: 2022-08-05 | Disposition: A | Discharge status: Home / Self Care | Payer: Medicare HMO | Source: Ambulatory Visit | Attending: Pulmonary Disease | Admitting: Pulmonary Disease

## 2022-08-05 DIAGNOSIS — R0602 Shortness of breath: Secondary | ICD-10-CM | POA: Insufficient documentation

## 2022-08-05 DIAGNOSIS — J432 Centrilobular emphysema: Secondary | ICD-10-CM | POA: Diagnosis not present

## 2022-08-05 DIAGNOSIS — J4489 Other specified chronic obstructive pulmonary disease: Secondary | ICD-10-CM | POA: Insufficient documentation

## 2022-08-07 ENCOUNTER — Ambulatory Visit
Admission: RE | Admit: 2022-08-07 | Discharge: 2022-08-07 | Disposition: A | Discharge status: Home / Self Care | Payer: Medicare HMO | Source: Ambulatory Visit | Attending: Family Medicine | Admitting: Family Medicine

## 2022-08-07 DIAGNOSIS — Z8781 Personal history of (healed) traumatic fracture: Secondary | ICD-10-CM | POA: Diagnosis not present

## 2022-08-07 DIAGNOSIS — M150 Primary generalized (osteo)arthritis: Secondary | ICD-10-CM | POA: Diagnosis not present

## 2022-08-07 DIAGNOSIS — N951 Menopausal and female climacteric states: Secondary | ICD-10-CM | POA: Diagnosis not present

## 2022-08-07 DIAGNOSIS — Z1231 Encounter for screening mammogram for malignant neoplasm of breast: Secondary | ICD-10-CM | POA: Diagnosis not present

## 2022-08-07 DIAGNOSIS — E2839 Other primary ovarian failure: Secondary | ICD-10-CM | POA: Diagnosis not present

## 2022-08-07 DIAGNOSIS — E349 Endocrine disorder, unspecified: Secondary | ICD-10-CM | POA: Diagnosis not present

## 2022-08-16 ENCOUNTER — Emergency Department (HOSPITAL_COMMUNITY)
Admission: EM | Admit: 2022-08-16 | Discharge: 2022-08-16 | Disposition: A | Discharge status: Home / Self Care | Payer: Medicare HMO | Attending: Emergency Medicine | Admitting: Emergency Medicine

## 2022-08-16 ENCOUNTER — Emergency Department (HOSPITAL_COMMUNITY): Payer: Medicare HMO

## 2022-08-16 ENCOUNTER — Other Ambulatory Visit: Payer: Self-pay

## 2022-08-16 ENCOUNTER — Encounter (HOSPITAL_COMMUNITY): Payer: Self-pay

## 2022-08-16 DIAGNOSIS — S022XXA Fracture of nasal bones, initial encounter for closed fracture: Secondary | ICD-10-CM | POA: Insufficient documentation

## 2022-08-16 DIAGNOSIS — Z91040 Latex allergy status: Secondary | ICD-10-CM | POA: Diagnosis not present

## 2022-08-16 DIAGNOSIS — J45909 Unspecified asthma, uncomplicated: Secondary | ICD-10-CM | POA: Insufficient documentation

## 2022-08-16 DIAGNOSIS — Z7982 Long term (current) use of aspirin: Secondary | ICD-10-CM | POA: Insufficient documentation

## 2022-08-16 DIAGNOSIS — W19XXXA Unspecified fall, initial encounter: Secondary | ICD-10-CM

## 2022-08-16 DIAGNOSIS — Z79899 Other long term (current) drug therapy: Secondary | ICD-10-CM | POA: Diagnosis not present

## 2022-08-16 DIAGNOSIS — S0003XA Contusion of scalp, initial encounter: Secondary | ICD-10-CM

## 2022-08-16 DIAGNOSIS — S0542XA Penetrating wound of orbit with or without foreign body, left eye, initial encounter: Secondary | ICD-10-CM | POA: Insufficient documentation

## 2022-08-16 DIAGNOSIS — W1839XA Other fall on same level, initial encounter: Secondary | ICD-10-CM | POA: Diagnosis not present

## 2022-08-16 DIAGNOSIS — S199XXA Unspecified injury of neck, initial encounter: Secondary | ICD-10-CM | POA: Diagnosis not present

## 2022-08-16 DIAGNOSIS — S0990XA Unspecified injury of head, initial encounter: Secondary | ICD-10-CM | POA: Diagnosis not present

## 2022-08-16 DIAGNOSIS — I10 Essential (primary) hypertension: Secondary | ICD-10-CM | POA: Diagnosis not present

## 2022-08-16 DIAGNOSIS — S0093XA Contusion of unspecified part of head, initial encounter: Secondary | ICD-10-CM | POA: Diagnosis not present

## 2022-08-16 DIAGNOSIS — Y92003 Bedroom of unspecified non-institutional (private) residence as the place of occurrence of the external cause: Secondary | ICD-10-CM | POA: Diagnosis not present

## 2022-08-16 DIAGNOSIS — E041 Nontoxic single thyroid nodule: Secondary | ICD-10-CM | POA: Diagnosis not present

## 2022-08-16 DIAGNOSIS — J449 Chronic obstructive pulmonary disease, unspecified: Secondary | ICD-10-CM | POA: Insufficient documentation

## 2022-08-16 DIAGNOSIS — R0902 Hypoxemia: Secondary | ICD-10-CM | POA: Diagnosis not present

## 2022-08-16 DIAGNOSIS — S0992XA Unspecified injury of nose, initial encounter: Secondary | ICD-10-CM | POA: Diagnosis present

## 2022-08-16 MED ORDER — OXYCODONE-ACETAMINOPHEN 5-325 MG PO TABS
1.0000 | ORAL_TABLET | Freq: Once | ORAL | Status: AC
  Administered 2022-08-16: 1 via ORAL
  Filled 2022-08-16: qty 1

## 2022-08-16 NOTE — ED Triage Notes (Signed)
Pt BIB [facility] from home d/t a ground-level  mechanical fall. Pt missed a step walking to her bedroom. Pt fell w her face down. Hematoma noted on the forehead. Skin tear on the R corner of the eye. Pt did not lose consciousness . Not on blood thinner. Pt was ambulatory on scene. A&O X4. VSS. Hx COPD HTN.   180/94

## 2022-08-16 NOTE — Discharge Instructions (Addendum)
As we discussed, you do have a broken nose seen on CT imaging.  I have given you a referral to an ear nose and throat doctor with a number to call to schedule an appointment for follow-up management of this.  In the interim, I recommend that you rest and ice the area and take your home pain medication.  I recommend calling your primary doctor to schedule follow-up appointment as well.  Return if development of any new or worsening symptoms.

## 2022-08-16 NOTE — ED Provider Notes (Signed)
[place] [facility] DEPARTMENT AT [facility] Provider Note   CSN: [id number] Arrival date & time: 08/16/22  1758     History  Chief Complaint  Patient presents with   Kelsey Diaz    Kelsey Diaz is a 77 y.o. female.  Patient with history of hypertension, GERD, asthma, COPD, vtach presents today with complaints of fall. She states that same occurred immediately prior to arrival today when she was walking up the steps and tripped on the top step and fell and struck her face on the ground.  She normally walks with a walker but did not have it at the time.  She is endorsing pain to her face with a hematoma noted over her right forehead.  Denies any other injuries or complaints.  She was able to get up off the ground and walk after the fall.  She is not anticoagulated.  The history is provided by the patient. No language interpreter was used.  Fall       Home Medications Prior to Admission medications   Medication Sig Start Date End Date Taking? Authorizing Provider  albuterol (ACCUNEB) 0.63 MG/3ML nebulizer solution Take 1 ampule by nebulization every 6 (six) hours as needed for wheezing or shortness of breath. 06/24/16   [provider]  albuterol (VENTOLIN HFA) 108 (90 Base) MCG/ACT inhaler Inhale 2 puffs into the lungs as needed for shortness of breath.    [provider]  amiodarone (PACERONE) 200 MG tablet Take 1 tablet (200 mg total) by mouth daily. 07/09/22   Graciella Freer, PA-C  aspirin 81 MG chewable tablet Chew 81 mg by mouth daily.    [provider]  Calcium Carbonate-Vitamin D 600-200 MG-UNIT TABS Take 1 tablet by mouth daily.     [provider]  Carboxymethylcellulose Sodium (EYE DROPS OP) Place 2 drops into both eyes as needed (clear eyes).    [provider]  Clobetasol Prop Emollient Base 0.05 % emollient cream Apply 1 application  topically 2 (two) times daily as needed (skin irritation). 01/26/19    [provider]  CRANBERRY PO Take 1 tablet by mouth daily.    [provider]  DULoxetine (CYMBALTA) 60 MG capsule Take 60 mg by mouth 2 (two) times daily.    [provider]  etanercept (ENBREL) 50 MG/ML injection Inject 50 mg into the skin every Wednesday.    [provider]  fluticasone (FLONASE) 50 MCG/ACT nasal spray Place 2 sprays into both nostrils at bedtime. 05/18/18   [provider]  HYDROcodone-acetaminophen (NORCO) 7.5-325 MG tablet Take 1 tablet by mouth every 6 (six) hours as needed for moderate pain or severe pain. 07/26/22 08/25/22  Yevette Edwards, MD  ibuprofen (ADVIL) 200 MG tablet Take 400 mg by mouth as needed for headache or moderate pain.    [provider]  magnesium oxide (MAG-OX) 400 (240 Mg) MG tablet Take 1 tablet (400 mg total) by mouth daily. 05/05/22   Azucena Fallen, MD  meloxicam (MOBIC) 15 MG tablet Take 15 mg by mouth daily.    [provider]  metoprolol succinate (TOPROL XL) 50 MG 24 hr tablet Take 1 tablet (50 mg total) by mouth 2 (two) times daily. 05/16/22   Barrett, Joline Salt, PA-C  Multiple Vitamin (MULTI-VITAMIN PO) Take 1 tablet by mouth daily.     [provider]  Omega-3 Fatty Acids (FISH OIL PO) Take 2 capsules by mouth daily.    [provider]  omeprazole (PRILOSEC) 40 MG capsule Take 40 mg by mouth daily. 04/08/17   [provider]  OSPHENA 60 MG TABS Take 60 mg by mouth daily. 06/25/19   [provider]  rosuvastatin (CRESTOR) 20 MG tablet Take 20 mg by mouth every Monday. 01/02/20   [provider]  Tiotropium Bromide-Olodaterol (STIOLTO RESPIMAT) 2.5-2.5 MCG/ACT AERS Inhale 1 puff into the lungs in the morning and at bedtime. 07/29/22   Chilton Greathouse, MD      Allergies    Morphine, Advair diskus [fluticasone-salmeterol], Ambien [zolpidem tartrate], Amlodipine besylate, Azithromycin, Bextra [valdecoxib], Bupropion, Captopril, Irbesartan, Latex,  Lisinopril, Pacerone [amiodarone], Pulmicort [budesonide], Savella [milnacipran hcl], Spiriva [tiotropium bromide monohydrate], Suvorexant, and Tiotropium bromide monohydrate    Review of Systems   Review of Systems  All other systems reviewed and are negative.   Physical Exam Updated Vital Signs BP (!) 146/72   Pulse (!) 51   Temp 98.3 F (36.8 C) (Oral)   Resp 16   Ht 5\' 7"  (1.702 m)   Wt 86.6 kg   SpO2 97%   BMI 29.91 kg/m  Physical Exam Vitals and nursing note reviewed.  Constitutional:      General: She is not in acute distress.    Appearance: Normal appearance. She is normal weight. She is not ill-appearing, toxic-appearing or diaphoretic.  HENT:     Head: Normocephalic and atraumatic.     Comments: No racoon eyes No battle sign  Large hematoma noted to the right anterior forehead. Small superficial skin tear noted lateral to the left eye. No active bleeding  Some dried blood noted in the left nare, no septal hematoma.   No signs of intraoral trauma.  Eyes:     Extraocular Movements: Extraocular movements intact.     Pupils: Pupils are equal, round, and reactive to light.  Cardiovascular:     Rate and Rhythm: Normal rate and regular rhythm.     Heart sounds: Normal heart sounds.  Pulmonary:     Effort: Pulmonary effort is normal. No respiratory distress.     Breath sounds: Normal breath sounds.  Chest:     Chest wall: No tenderness.  Abdominal:     General: Abdomen is flat.     Palpations: Abdomen is soft.     Tenderness: There is no abdominal tenderness.  Musculoskeletal:        General: Normal range of motion.     Cervical back: Normal, normal range of motion and neck supple.     Thoracic back: Normal.     Lumbar back: Normal.     Right lower leg: No edema.     Left lower leg: No edema.     Comments: No midline tenderness, no stepoffs or deformity noted on palpation of cervical, thoracic, and lumbar spine  Patient observed to be ambulatory with steady  gait. No focal bony tenderness  Skin:    General: Skin is warm and dry.  Neurological:     General: No focal deficit present.     Mental Status: She is alert and oriented to person, place, and time.  Psychiatric:        Mood and Affect: Mood normal.        Behavior: Behavior normal.     ED Results / Procedures / Treatments   Labs (all labs ordered are listed, but only abnormal results are displayed) Labs Reviewed - No data to display  EKG None  Radiology CT Head Wo Contrast  Result Date: 08/16/2022 CLINICAL  DATA:  Head trauma, minor (Age >= 65y); Facial trauma, blunt; Neck trauma (Age >= 65y) EXAM: CT HEAD WITHOUT CONTRAST CT MAXILLOFACIAL WITHOUT CONTRAST CT CERVICAL SPINE WITHOUT CONTRAST TECHNIQUE: Multidetector CT imaging of the head, cervical spine, and maxillofacial structures were performed using the standard protocol without intravenous contrast. Multiplanar CT image reconstructions of the cervical spine and maxillofacial structures were also generated. RADIATION DOSE REDUCTION: This exam was performed according to the departmental dose-optimization program which includes automated exposure control, adjustment of the mA and/or kV according to patient size and/or use of iterative reconstruction technique. COMPARISON:  None Available. FINDINGS: CT HEAD FINDINGS Brain: No evidence of acute infarction, hemorrhage, hydrocephalus, extra-axial collection or mass lesion/mass effect. There is sequela of mild chronic microvascular ischemic change Vascular: No hyperdense vessel or unexpected calcification. Skull: Large soft tissue hematoma along the midline frontal scalp. No evidence of underlying calvarial fracture. Other: None. CT MAXILLOFACIAL FINDINGS Osseous: Mildly displaced fracture of the nasal bone along midline. Orbits: Negative. No traumatic or inflammatory finding. Sinuses: No middle ear or mastoid effusion. Paranasal sinuses clear. Bilateral lens replacement. Orbits are otherwise  unremarkable. Soft tissues: Negative. CT CERVICAL SPINE FINDINGS Alignment: Grade 1 anterolisthesis of C3 on C4 and C4 on C5. Trace anterolisthesis of C7 on T1. Skull base and vertebrae: No acute fracture. No primary bone lesion or focal pathologic process. Soft tissues and spinal canal: No prevertebral fluid or swelling. No visible canal hematoma. Atherosclerotic calcification of bilateral carotid bifurcations. Disc levels:  No evidence of high-grade spinal canal stenosis. Upper chest: Assessment is limited due to respiratory motion artifact. Other: 2.2 cm calcified left thyroid nodule. Recommend further evaluation with a dedicated thyroid ultrasound if not previously performed. IMPRESSION: 1. No acute intracranial abnormality. 2. Large soft tissue hematoma along the midline frontal scalp. No evidence of underlying calvarial fracture. 3. Mildly displaced fracture of the nasal bone along midline. 4. No acute fracture or traumatic subluxation of the cervical spine. 5. 2.2 cm calcified left thyroid nodule. Recommend further evaluation with a dedicated thyroid ultrasound if not previously performed. Electronically Signed   By: Lorenza Cambridge M.D.   On: 08/16/2022 19:31   CT Cervical Spine Wo Contrast  Result Date: 08/16/2022 CLINICAL DATA:  Head trauma, minor (Age >= 65y); Facial trauma, blunt; Neck trauma (Age >= 65y) EXAM: CT HEAD WITHOUT CONTRAST CT MAXILLOFACIAL WITHOUT CONTRAST CT CERVICAL SPINE WITHOUT CONTRAST TECHNIQUE: Multidetector CT imaging of the head, cervical spine, and maxillofacial structures were performed using the standard protocol without intravenous contrast. Multiplanar CT image reconstructions of the cervical spine and maxillofacial structures were also generated. RADIATION DOSE REDUCTION: This exam was performed according to the departmental dose-optimization program which includes automated exposure control, adjustment of the mA and/or kV according to patient size and/or use of iterative  reconstruction technique. COMPARISON:  None Available. FINDINGS: CT HEAD FINDINGS Brain: No evidence of acute infarction, hemorrhage, hydrocephalus, extra-axial collection or mass lesion/mass effect. There is sequela of mild chronic microvascular ischemic change Vascular: No hyperdense vessel or unexpected calcification. Skull: Large soft tissue hematoma along the midline frontal scalp. No evidence of underlying calvarial fracture. Other: None. CT MAXILLOFACIAL FINDINGS Osseous: Mildly displaced fracture of the nasal bone along midline. Orbits: Negative. No traumatic or inflammatory finding. Sinuses: No middle ear or mastoid effusion. Paranasal sinuses clear. Bilateral lens replacement. Orbits are otherwise unremarkable. Soft tissues: Negative. CT CERVICAL SPINE FINDINGS Alignment: Grade 1 anterolisthesis of C3 on C4 and C4 on C5. Trace anterolisthesis of C7 on T1. Skull base  and vertebrae: No acute fracture. No primary bone lesion or focal pathologic process. Soft tissues and spinal canal: No prevertebral fluid or swelling. No visible canal hematoma. Atherosclerotic calcification of bilateral carotid bifurcations. Disc levels:  No evidence of high-grade spinal canal stenosis. Upper chest: Assessment is limited due to respiratory motion artifact. Other: 2.2 cm calcified left thyroid nodule. Recommend further evaluation with a dedicated thyroid ultrasound if not previously performed. IMPRESSION: 1. No acute intracranial abnormality. 2. Large soft tissue hematoma along the midline frontal scalp. No evidence of underlying calvarial fracture. 3. Mildly displaced fracture of the nasal bone along midline. 4. No acute fracture or traumatic subluxation of the cervical spine. 5. 2.2 cm calcified left thyroid nodule. Recommend further evaluation with a dedicated thyroid ultrasound if not previously performed. Electronically Signed   By: Lorenza Cambridge M.D.   On: 08/16/2022 19:31   CT Maxillofacial Wo Contrast  Result Date:  08/16/2022 CLINICAL DATA:  Head trauma, minor (Age >= 65y); Facial trauma, blunt; Neck trauma (Age >= 65y) EXAM: CT HEAD WITHOUT CONTRAST CT MAXILLOFACIAL WITHOUT CONTRAST CT CERVICAL SPINE WITHOUT CONTRAST TECHNIQUE: Multidetector CT imaging of the head, cervical spine, and maxillofacial structures were performed using the standard protocol without intravenous contrast. Multiplanar CT image reconstructions of the cervical spine and maxillofacial structures were also generated. RADIATION DOSE REDUCTION: This exam was performed according to the departmental dose-optimization program which includes automated exposure control, adjustment of the mA and/or kV according to patient size and/or use of iterative reconstruction technique. COMPARISON:  None Available. FINDINGS: CT HEAD FINDINGS Brain: No evidence of acute infarction, hemorrhage, hydrocephalus, extra-axial collection or mass lesion/mass effect. There is sequela of mild chronic microvascular ischemic change Vascular: No hyperdense vessel or unexpected calcification. Skull: Large soft tissue hematoma along the midline frontal scalp. No evidence of underlying calvarial fracture. Other: None. CT MAXILLOFACIAL FINDINGS Osseous: Mildly displaced fracture of the nasal bone along midline. Orbits: Negative. No traumatic or inflammatory finding. Sinuses: No middle ear or mastoid effusion. Paranasal sinuses clear. Bilateral lens replacement. Orbits are otherwise unremarkable. Soft tissues: Negative. CT CERVICAL SPINE FINDINGS Alignment: Grade 1 anterolisthesis of C3 on C4 and C4 on C5. Trace anterolisthesis of C7 on T1. Skull base and vertebrae: No acute fracture. No primary bone lesion or focal pathologic process. Soft tissues and spinal canal: No prevertebral fluid or swelling. No visible canal hematoma. Atherosclerotic calcification of bilateral carotid bifurcations. Disc levels:  No evidence of high-grade spinal canal stenosis. Upper chest: Assessment is limited due  to respiratory motion artifact. Other: 2.2 cm calcified left thyroid nodule. Recommend further evaluation with a dedicated thyroid ultrasound if not previously performed. IMPRESSION: 1. No acute intracranial abnormality. 2. Large soft tissue hematoma along the midline frontal scalp. No evidence of underlying calvarial fracture. 3. Mildly displaced fracture of the nasal bone along midline. 4. No acute fracture or traumatic subluxation of the cervical spine. 5. 2.2 cm calcified left thyroid nodule. Recommend further evaluation with a dedicated thyroid ultrasound if not previously performed. Electronically Signed   By: Lorenza Cambridge M.D.   On: 08/16/2022 19:31    Procedures Procedures    Medications Ordered in ED Medications  oxyCODONE-acetaminophen (PERCOCET/ROXICET) 5-325 MG per tablet 1 tablet (1 tablet Oral Given 08/16/22 1850)    ED Course/ Medical Decision Making/ A&P                             Medical Decision Making Amount and/or Complexity of  Data Reviewed Radiology: ordered.  Risk Prescription drug management.   Patient presents today with complaints of mechanical fall earlier today.  She is afebrile, nontoxic-appearing, and in no acute distress with reassuring vital signs.  She is also alert and oriented and neurologically intact without focal deficits.  She does have a large hematoma noted to the right forehead.  She is not anticoagulated.  CT imaging obtained of the head, neck, and max face which have resulted and reveal  1. No acute intracranial abnormality. 2. Large soft tissue hematoma along the midline frontal scalp. No evidence of underlying calvarial fracture. 3. Mildly displaced fracture of the nasal bone along midline. 4. No acute fracture or traumatic subluxation of the cervical spine. 5. 2.2 cm calcified left thyroid nodule. Recommend further evaluation with a dedicated thyroid ultrasound if not previously performed.  I have personally reviewed and interpreted  this imaging and agree with radiology interpretation.   Patient without signs of serious head, neck, or back injury. No midline spinal tenderness or TTP of the chest or abd. Normal neurological exam. No concern for closed head injury, lung injury, or intraabdominal injury. Normal muscle soreness after fall. Given percocet for pain with improvement. Patient is already on a pain regimen for her chronic pain.   Patient is able to ambulate without difficulty in the ED.  Pt is hemodynamically stable, in NAD.   Pain has been managed & pt has no complaints prior to dc.  Patient counseled on typical course of muscle stiffness and soreness post-fall. Discussed s/s that should cause them to return. Will give ENT referral for follow-up management of her nasal bone fracture. Evaluation and diagnostic testing in the emergency department does not suggest an emergent condition requiring admission or immediate intervention beyond what has been performed at this time.  Plan for discharge with close PCP follow-up.  Patient is understanding and amenable with plan, educated on red flag symptoms that would prompt immediate return.  Patient discharged in stable condition.  Findings and plan of care discussed with supervising physician Dr. Deretha Emory who is in agreement.   Final Clinical Impression(s) / ED Diagnoses Final diagnoses:  Fall, initial encounter  Closed fracture of nasal bone, initial encounter  Hematoma of frontal scalp, initial encounter    Rx / DC Orders ED Discharge Orders     None     An After Visit Summary was printed and given to the patient.     Vear Clock 08/16/22 2022    Vanetta Mulders, MD 08/19/22 [phone]

## 2022-08-16 NOTE — ED Notes (Signed)
Gone to CT

## 2022-08-17 DIAGNOSIS — W19XXXA Unspecified fall, initial encounter: Secondary | ICD-10-CM | POA: Diagnosis not present

## 2022-08-17 DIAGNOSIS — S022XXA Fracture of nasal bones, initial encounter for closed fracture: Secondary | ICD-10-CM | POA: Diagnosis not present

## 2022-08-18 ENCOUNTER — Ambulatory Visit: Payer: Medicare HMO | Attending: Anesthesiology | Admitting: Anesthesiology

## 2022-08-18 ENCOUNTER — Encounter: Payer: Self-pay | Admitting: Anesthesiology

## 2022-08-18 DIAGNOSIS — M47817 Spondylosis without myelopathy or radiculopathy, lumbosacral region: Secondary | ICD-10-CM | POA: Diagnosis not present

## 2022-08-18 DIAGNOSIS — M4716 Other spondylosis with myelopathy, lumbar region: Secondary | ICD-10-CM

## 2022-08-18 DIAGNOSIS — M25552 Pain in left hip: Secondary | ICD-10-CM

## 2022-08-18 DIAGNOSIS — M5136 Other intervertebral disc degeneration, lumbar region: Secondary | ICD-10-CM | POA: Diagnosis not present

## 2022-08-18 DIAGNOSIS — M797 Fibromyalgia: Secondary | ICD-10-CM | POA: Diagnosis not present

## 2022-08-18 DIAGNOSIS — G894 Chronic pain syndrome: Secondary | ICD-10-CM | POA: Diagnosis not present

## 2022-08-18 DIAGNOSIS — M0579 Rheumatoid arthritis with rheumatoid factor of multiple sites without organ or systems involvement: Secondary | ICD-10-CM | POA: Diagnosis not present

## 2022-08-18 DIAGNOSIS — F1190 Opioid use, unspecified, uncomplicated: Secondary | ICD-10-CM | POA: Diagnosis not present

## 2022-08-18 DIAGNOSIS — M25551 Pain in right hip: Secondary | ICD-10-CM | POA: Diagnosis not present

## 2022-08-18 MED ORDER — HYDROCODONE-ACETAMINOPHEN 7.5-325 MG PO TABS: 1.0000 | ORAL_TABLET | Freq: Four times a day (QID) | ORAL | 0 refills | Status: DC | PRN

## 2022-08-18 MED ORDER — HYDROCODONE-ACETAMINOPHEN 7.5-325 MG PO TABS: 1.0000 | ORAL_TABLET | Freq: Four times a day (QID) | ORAL | 0 refills | Status: AC | PRN

## 2022-08-18 NOTE — Progress Notes (Signed)
Virtual Visit via Telephone Note  I connected with Kelsey Diaz on 08/18/22 at  3:40 PM EDT by telephone and verified that I am speaking with the correct person using two identifiers.  Location: Patient: Home Provider: Pain control center   I discussed the limitations, risks, security and privacy concerns of performing an evaluation and management service by telephone and the availability of in person appointments. I also discussed with the patient that there may be a patient responsible charge related to this service. The patient expressed understanding and agreed to proceed.   History of Present Illness: I spoke with Kelsey Diaz via telephone as we were unable link for the video portion of the conference.  She reports that her back pain is holding up nicely.  She had an epidural back in February and is not experiencing any sciatica symptoms at this time.  She takes her hydrocodone 7.5 mg tablets to help with her back pain which is a chronic type aching gnawing pain but has been more tolerable recently.  She still getting good relief rated at 75 to 80% when she takes her hydrocodone.  Fortunately the sciatica has abated at this stage.  No change in lower extremity strength function bowel or bladder function is noted.  Her balance has been good but she did recently fall on a small step in her house.  She sustained a facial injury with 2 black eyes and a fractured nose.  She maintains that this was unrelated to her medications and that her foot caught on the step prompting the injury.  She is recovering from this at present but otherwise doing well.  Review of systems: General: No fevers or chills Pulmonary: No shortness of breath or dyspnea Cardiac: No angina or palpitations or lightheadedness GI: No abdominal pain or constipation Psych: No depression    Observations/Objective:  Current Outpatient Medications:    [START ON 09/24/2022] HYDROcodone-acetaminophen (NORCO) 7.5-325 MG tablet,  Take 1 tablet by mouth every 6 (six) hours as needed for moderate pain or severe pain., Disp: 120 tablet, Rfl: 0   albuterol (ACCUNEB) 0.63 MG/3ML nebulizer solution, Take 1 ampule by nebulization every 6 (six) hours as needed for wheezing or shortness of breath., Disp: , Rfl:    albuterol (VENTOLIN HFA) 108 (90 Base) MCG/ACT inhaler, Inhale 2 puffs into the lungs as needed for shortness of breath., Disp: , Rfl:    amiodarone (PACERONE) 200 MG tablet, Take 1 tablet (200 mg total) by mouth daily., Disp: 90 tablet, Rfl: 3   aspirin 81 MG chewable tablet, Chew 81 mg by mouth daily., Disp: , Rfl:    Calcium Carbonate-Vitamin D 600-200 MG-UNIT TABS, Take 1 tablet by mouth daily. , Disp: , Rfl:    Carboxymethylcellulose Sodium (EYE DROPS OP), Place 2 drops into both eyes as needed (clear eyes)., Disp: , Rfl:    Clobetasol Prop Emollient Base 0.05 % emollient cream, Apply 1 application  topically 2 (two) times daily as needed (skin irritation)., Disp: , Rfl:    CRANBERRY PO, Take 1 tablet by mouth daily., Disp: , Rfl:    DULoxetine (CYMBALTA) 60 MG capsule, Take 60 mg by mouth 2 (two) times daily., Disp: , Rfl:    etanercept (ENBREL) 50 MG/ML injection, Inject 50 mg into the skin every Wednesday., Disp: , Rfl:    fluticasone (FLONASE) 50 MCG/ACT nasal spray, Place 2 sprays into both nostrils at bedtime., Disp: , Rfl:    [START ON 08/25/2022] HYDROcodone-acetaminophen (NORCO) 7.5-325 MG tablet, Take 1  tablet by mouth every 6 (six) hours as needed for moderate pain or severe pain., Disp: 120 tablet, Rfl: 0   ibuprofen (ADVIL) 200 MG tablet, Take 400 mg by mouth as needed for headache or moderate pain., Disp: , Rfl:    magnesium oxide (MAG-OX) 400 (240 Mg) MG tablet, Take 1 tablet (400 mg total) by mouth daily., Disp: 30 tablet, Rfl: 1   meloxicam (MOBIC) 15 MG tablet, Take 15 mg by mouth daily., Disp: , Rfl:    metoprolol succinate (TOPROL XL) 50 MG 24 hr tablet, Take 1 tablet (50 mg total) by mouth 2 (two)  times daily., Disp: 60 tablet, Rfl: 3   Multiple Vitamin (MULTI-VITAMIN PO), Take 1 tablet by mouth daily. , Disp: , Rfl:    Omega-3 Fatty Acids (FISH OIL PO), Take 2 capsules by mouth daily., Disp: , Rfl:    omeprazole (PRILOSEC) 40 MG capsule, Take 40 mg by mouth daily., Disp: , Rfl: 1   OSPHENA 60 MG TABS, Take 60 mg by mouth daily., Disp: , Rfl:    rosuvastatin (CRESTOR) 20 MG tablet, Take 20 mg by mouth every Monday., Disp: , Rfl:    Tiotropium Bromide-Olodaterol (STIOLTO RESPIMAT) 2.5-2.5 MCG/ACT AERS, Inhale 1 puff into the lungs in the morning and at bedtime., Disp: 4 g, Rfl: 0   Past Medical History:  Diagnosis Date   Allergic rhinitis    Anxiety    Asthma    Bilateral hip pain 11/28/2018   Colitis    COPD (chronic obstructive pulmonary disease) (HCC)    Fibromyalgia 05/16/2019   GERD (gastroesophageal reflux disease)    With esophageal strictures   Hypertension    Hyperthyroidism    OSA (obstructive sleep apnea) 02/09/2012   No mask.  Did not tolerate   Osteoarthritis    Rheumatoid arthritis (HCC) 10/03/2018     Assessment and Plan: 1. DDD (degenerative disc disease), lumbar   2. Lumbar spondylosis with myelopathy   3. Bilateral hip pain   4. Chronic, continuous use of opioids   5. Chronic pain syndrome   6. Rheumatoid arthritis involving multiple sites with positive rheumatoid factor (HCC)   7. Fibromyalgia   8. Facet arthritis of lumbosacral region   Based on our conversation it is appropriate to refill her medicines as she continues to do well with these.  No side effects from the medications are reported.  She feels that her balance is adequate and unaffected by the opioid that she takes chronically.  The fall she sustained she attributes to catching her foot on the step but otherwise has no balance issues.  I have reviewed the [facility] practitioner database information is appropriate for refills which will be dated for July 2 and August 1.  Will have her return  to clinic in 2 months.  Continue follow-up with her primary care physician for baseline medical care.  We will not plan on any further interventional therapy at this point.  Follow Up Instructions:    I discussed the assessment and treatment plan with the patient. The patient was provided an opportunity to ask questions and all were answered. The patient agreed with the plan and demonstrated an understanding of the instructions.   The patient was advised to call back or seek an in-person evaluation if the symptoms worsen or if the condition fails to improve as anticipated.  I provided 30 minutes of non-face-to-face time during this encounter.   Yevette Edwards, MD

## 2022-09-02 DIAGNOSIS — Z0000 Encounter for general adult medical examination without abnormal findings: Secondary | ICD-10-CM | POA: Diagnosis not present

## 2022-09-02 DIAGNOSIS — D849 Immunodeficiency, unspecified: Secondary | ICD-10-CM | POA: Diagnosis not present

## 2022-09-02 DIAGNOSIS — E890 Postprocedural hypothyroidism: Secondary | ICD-10-CM | POA: Diagnosis not present

## 2022-09-02 DIAGNOSIS — M059 Rheumatoid arthritis with rheumatoid factor, unspecified: Secondary | ICD-10-CM | POA: Diagnosis not present

## 2022-09-02 DIAGNOSIS — J449 Chronic obstructive pulmonary disease, unspecified: Secondary | ICD-10-CM | POA: Diagnosis not present

## 2022-09-02 DIAGNOSIS — E785 Hyperlipidemia, unspecified: Secondary | ICD-10-CM | POA: Diagnosis not present

## 2022-09-02 DIAGNOSIS — E1169 Type 2 diabetes mellitus with other specified complication: Secondary | ICD-10-CM | POA: Diagnosis not present

## 2022-09-02 DIAGNOSIS — E119 Type 2 diabetes mellitus without complications: Secondary | ICD-10-CM | POA: Diagnosis not present

## 2022-09-02 DIAGNOSIS — I472 Ventricular tachycardia, unspecified: Secondary | ICD-10-CM | POA: Diagnosis not present

## 2022-09-02 DIAGNOSIS — F324 Major depressive disorder, single episode, in partial remission: Secondary | ICD-10-CM | POA: Diagnosis not present

## 2022-09-02 DIAGNOSIS — I10 Essential (primary) hypertension: Secondary | ICD-10-CM | POA: Diagnosis not present

## 2022-09-03 ENCOUNTER — Other Ambulatory Visit: Payer: Self-pay | Admitting: Family Medicine

## 2022-09-03 DIAGNOSIS — E041 Nontoxic single thyroid nodule: Secondary | ICD-10-CM

## 2022-09-04 ENCOUNTER — Other Ambulatory Visit: Payer: Self-pay | Admitting: Pulmonary Disease

## 2022-09-08 ENCOUNTER — Ambulatory Visit
Admission: RE | Admit: 2022-09-08 | Discharge: 2022-09-08 | Disposition: A | Discharge status: Home / Self Care | Payer: Medicare HMO | Source: Ambulatory Visit | Attending: Family Medicine | Admitting: Family Medicine

## 2022-09-08 DIAGNOSIS — E041 Nontoxic single thyroid nodule: Secondary | ICD-10-CM

## 2022-09-08 DIAGNOSIS — E042 Nontoxic multinodular goiter: Secondary | ICD-10-CM | POA: Diagnosis not present

## 2022-09-24 ENCOUNTER — Encounter: Payer: Self-pay | Admitting: Cardiology

## 2022-09-24 ENCOUNTER — Ambulatory Visit: Payer: Medicare HMO | Attending: Cardiology | Admitting: Cardiology

## 2022-09-24 VITALS — BP 110/68 | HR 59 | Ht 66.5 in | Wt 197.0 lb

## 2022-09-24 DIAGNOSIS — R002 Palpitations: Secondary | ICD-10-CM

## 2022-09-24 DIAGNOSIS — Z79899 Other long term (current) drug therapy: Secondary | ICD-10-CM | POA: Diagnosis not present

## 2022-09-24 DIAGNOSIS — I472 Ventricular tachycardia, unspecified: Secondary | ICD-10-CM | POA: Diagnosis not present

## 2022-09-24 LAB — COMPREHENSIVE METABOLIC PANEL
ALT: 18 IU/L (ref 0–32)
AST: 22 IU/L (ref 0–40)
Albumin: 4.2 g/dL (ref 3.8–4.8)
Alkaline Phosphatase: 61 IU/L (ref 44–121)
BUN/Creatinine Ratio: 24 (ref 12–28)
BUN: 24 mg/dL (ref 8–27)
Bilirubin Total: 0.4 mg/dL (ref 0.0–1.2)
CO2: 22 mmol/L (ref 20–29)
Calcium: 9.3 mg/dL (ref 8.7–10.3)
Chloride: 100 mmol/L (ref 96–106)
Creatinine, Ser: 0.98 mg/dL (ref 0.57–1.00)
Globulin, Total: 2.6 g/dL (ref 1.5–4.5)
Glucose: 123 mg/dL — ABNORMAL HIGH (ref 70–99)
Potassium: 4.5 mmol/L (ref 3.5–5.2)
Sodium: 136 mmol/L (ref 134–144)
Total Protein: 6.8 g/dL (ref 6.0–8.5)
eGFR: 59 mL/min/{1.73_m2} — ABNORMAL LOW (ref >59)

## 2022-09-24 LAB — TSH: TSH: 5.07 u[IU]/mL — ABNORMAL HIGH (ref 0.450–4.500)

## 2022-09-24 LAB — T4, FREE: Free T4: 1.12 ng/dL (ref 0.82–1.77)

## 2022-09-24 MED ORDER — METOPROLOL SUCCINATE ER 25 MG PO TB24: 25.0000 mg | ORAL_TABLET | Freq: Two times a day (BID) | ORAL | 3 refills | Status: DC

## 2022-09-24 MED ORDER — AMIODARONE HCL 100 MG PO TABS: 100.0000 mg | ORAL_TABLET | Freq: Every day | ORAL | 3 refills | Status: DC

## 2022-09-24 NOTE — Progress Notes (Signed)
Electrophysiology Office Follow up Visit Note:    Date:  09/24/2022   ID:  Kelsey Diaz, DOB Jan 22, 1946, MRN [id number]  PCP:  Laurann Montana, MD  [facility] Cardiologist:  Rollene Rotunda, MD  [facility] Electrophysiologist:  Lanier Prude, MD    Interval History:    Kelsey Diaz is a 77 y.o. female who presents for a follow up visit.   Last seen Jun 25, 2022 by Mardelle Matte.  The patient is followed for NSVT/VT, obstructive sleep apnea, hypertension, RA.  She was started on amiodarone after a hospitalization in March for dizziness.  During the hospitalization nonsustained and sustained VT was observed.  She did not tolerate amiodarone citing tremulousness and trouble thinking although those symptoms did not change after it was stopped.  It was restarted.  At the appoint with Mardelle Matte she was still taking 200 mg by mouth once daily of amiodarone  She was hospitalized in June 2024 with a mechanical fall.  In October 2022 she went for VT ablation but her arrhythmia was not inducible.  Today she is with family.  She is recovering from her recent fall.  She tells me that she is unsteady on her feet.  Is unclear to me whether or not is related to lower extremity weakness which she endorses from the knees down bilaterally.  It could also be related to one of her many medications.     Past medical, surgical, social and family history were reviewed.  ROS:   Please see the history of present illness.    All other systems reviewed and are negative.  EKGs/Labs/Other Studies Reviewed:    The following studies were reviewed today:    EKG Interpretation Date/Time:  Thursday September 24 2022 09:28:01 EDT Ventricular Rate:  59 PR Interval:  172 QRS Duration:  138 QT Interval:  482 QTC Calculation: 477 R Axis:   -53  Text Interpretation: Sinus bradycardia with Premature atrial complexes Right bundle branch block Left anterior fascicular block  Bifascicular block  Confirmed by  Steffanie Dunn [phone]) on 09/24/2022 9:31:10 AM    Physical Exam:    VS:  BP 110/68   Pulse (!) 59   Ht 5' 6.5" (1.689 m)   Wt 197 lb (89.4 kg)   SpO2 98%   BMI 31.32 kg/m     Wt Readings from Last 3 Encounters:  09/24/22 197 lb (89.4 kg)  08/16/22 191 lb (86.6 kg)  06/25/22 201 lb (91.2 kg)     GEN:  Well nourished, well developed in no acute distress.  Elderly.  Obese. CARDIAC: RRR, no murmurs, rubs, gallops RESPIRATORY:  Clear to auscultation without rales, wheezing or rhonchi       ASSESSMENT:    1. VT (ventricular tachycardia) (HCC)   2. Palpitations   3. Encounter for long-term (current) use of high-risk medication    PLAN:    In order of problems listed above:   #NSVT/VT #High risk drug monitoring-amiodarone Doing well on amiodarone.  Ventricular arrhythmias have quiesced. Liver function normal on recent blood work Thyroid stimulating hormone slightly elevated at 6.6 on May blood work, repeat today  Repeat CMP, TSH and free T4 today  I will reduce her amiodarone to 100 mg by mouth once daily and reduce her metoprolol to 25 mg by mouth twice daily to see whether or not those could be contributing to her gait instability.  I have encouraged her to discuss her gait instability with her primary care physician.  I suspect  she would benefit from physical therapy sessions to improve lower extremity strength.  Follow-up 6 months with APP  Signed, Steffanie Dunn, MD, [facility], [facility] 09/24/2022 9:31 AM    Electrophysiology [facility]

## 2022-09-24 NOTE — Patient Instructions (Addendum)
Medication Instructions:  Your physician has recommended you make the following change in your medication:  1) DECREASE amiodarone to 100 mg once daily  2) DECREASE metoprolol succinate 25 mg twice daily   *If you need a refill on your cardiac medications before your next appointment, please call your pharmacy*   Lab Work: TODAY: CMET, TSH, T4 If you have labs (blood work) drawn today and your tests are completely normal, you will receive your results only by: MyChart Message (if you have MyChart) OR A paper copy in the mail If you have any lab test that is abnormal or we need to change your treatment, we will call you to review the results.  Follow-Up: At [facility], you and your health needs are our priority.  As part of our continuing mission to provide you with exceptional heart care, we have created designated Provider Care Teams.  These Care Teams include your primary Cardiologist (physician) and Advanced Practice Providers (APPs -  Physician Assistants and Nurse Practitioners) who all work together to provide you with the care you need, when you need it.  Your next appointment:   6 month   Provider:   You will see one of the following Advanced Practice Providers on your designated Care Team:   Francis Dowse, Charlott Holler [address] [place], [place] Sherie Don, NP Canary Brim, NP

## 2022-09-28 ENCOUNTER — Telehealth: Payer: Self-pay

## 2022-09-28 ENCOUNTER — Other Ambulatory Visit: Payer: Self-pay | Admitting: Pulmonary Disease

## 2022-09-28 NOTE — Telephone Encounter (Signed)
The patient's daughter has been notified of the result and verbalized understanding.  All questions (if any) were answered. Frutoso Schatz, RN 09/28/2022 1:34 PM   Daughter reports that since the patient reduced her amiodarone and metoprolol she has felt much better. No more dizziness and improvement in her weakness.

## 2022-09-28 NOTE — Telephone Encounter (Signed)
-----   Message from Rossie Muskrat [facility] sent at 09/27/2022 10:40 PM EDT ----- Labs reviewed. Continue current medical therapy.  Sheria Lang T. Lalla Brothers, MD, [facility], [facility] Cardiac Electrophysiology

## 2022-09-29 ENCOUNTER — Telehealth: Payer: Self-pay | Admitting: Cardiology

## 2022-09-29 NOTE — Telephone Encounter (Signed)
Spoke with the patient's daughter who states that she is confused about the patient's metoprolol. She states that the patient was previously on Toprol XL 50 mg once per day. When the patient saw Dr. Lalla Brothers he advised that she cut Toprol XL in half so the daughter has been giving her 25 mg once per day. Advised that per her medication list she was supposed to be on 50 mg twice daily so when Dr. Lalla Brothers cut it in half it was to 25 mg twice daily. Daughter states that she was only taking it once per day due to her blood pressure dropping after she was in the hospital. Advised to continue with taking it once per day and I will make Dr. Lalla Brothers aware. Advised to monitor BP and HR.

## 2022-09-29 NOTE — Telephone Encounter (Signed)
Pt daughter called into clarify instructions for this medication. She states the pharmacy has her down to take different instructions they were told yesterday. Please advise.     amiodarone (PACERONE) 100 MG tablet

## 2022-10-06 ENCOUNTER — Other Ambulatory Visit: Payer: Self-pay | Admitting: Pulmonary Disease

## 2022-10-12 ENCOUNTER — Ambulatory Visit: Payer: Medicare HMO | Attending: Anesthesiology | Admitting: Anesthesiology

## 2022-10-12 ENCOUNTER — Encounter: Payer: Self-pay | Admitting: Anesthesiology

## 2022-10-12 DIAGNOSIS — M79642 Pain in left hand: Secondary | ICD-10-CM

## 2022-10-12 DIAGNOSIS — M25551 Pain in right hip: Secondary | ICD-10-CM

## 2022-10-12 DIAGNOSIS — G894 Chronic pain syndrome: Secondary | ICD-10-CM | POA: Diagnosis not present

## 2022-10-12 DIAGNOSIS — M79641 Pain in right hand: Secondary | ICD-10-CM | POA: Diagnosis not present

## 2022-10-12 DIAGNOSIS — M797 Fibromyalgia: Secondary | ICD-10-CM | POA: Diagnosis not present

## 2022-10-12 DIAGNOSIS — M51369 Other intervertebral disc degeneration, lumbar region without mention of lumbar back pain or lower extremity pain: Secondary | ICD-10-CM

## 2022-10-12 DIAGNOSIS — M5431 Sciatica, right side: Secondary | ICD-10-CM

## 2022-10-12 DIAGNOSIS — F1190 Opioid use, unspecified, uncomplicated: Secondary | ICD-10-CM | POA: Diagnosis not present

## 2022-10-12 DIAGNOSIS — M25552 Pain in left hip: Secondary | ICD-10-CM

## 2022-10-12 DIAGNOSIS — M4716 Other spondylosis with myelopathy, lumbar region: Secondary | ICD-10-CM | POA: Diagnosis not present

## 2022-10-12 DIAGNOSIS — M47817 Spondylosis without myelopathy or radiculopathy, lumbosacral region: Secondary | ICD-10-CM | POA: Diagnosis not present

## 2022-10-12 DIAGNOSIS — M5136 Other intervertebral disc degeneration, lumbar region: Secondary | ICD-10-CM

## 2022-10-12 DIAGNOSIS — M0579 Rheumatoid arthritis with rheumatoid factor of multiple sites without organ or systems involvement: Secondary | ICD-10-CM

## 2022-10-12 MED ORDER — HYDROCODONE-ACETAMINOPHEN 7.5-325 MG PO TABS: 1.0000 | ORAL_TABLET | Freq: Four times a day (QID) | ORAL | 0 refills | Status: DC | PRN

## 2022-10-12 MED ORDER — HYDROCODONE-ACETAMINOPHEN 7.5-325 MG PO TABS
1.0000 | ORAL_TABLET | Freq: Four times a day (QID) | ORAL | 0 refills | Status: DC | PRN
Start: 2022-11-23 — End: 2022-10-29

## 2022-10-12 NOTE — Progress Notes (Unsigned)
Virtual Visit via Telephone Note  I connected with Kelsey Diaz on 10/12/22 at  1:20 PM EDT by telephone and verified that I am speaking with the correct person using two identifiers.  Location: Patient: Home Provider: Pain control center   I discussed the limitations, risks, security and privacy concerns of performing an evaluation and management service by telephone and the availability of in person appointments. I also discussed with the patient that there may be a patient responsible charge related to this service. The patient expressed understanding and agreed to proceed.   History of Present Illness: I spoke with Kelsey Diaz via telephone as we were unable link for the video portion recovers.  She reports that she is continuing to heal up following her recent fall.  They attributed this to her Pacerone which she takes for a history of what sounds like ventricular tachycardia.  She is doing much better she reports.  She is healing well from the fall.  She continues to have her chronic neck and low back pain which she takes her medication for.  The medicines continue to help with the pain management.  Chronic opioids have served her well as she reports about 75 to 80% relief lasting 4 to 6 hours when she takes the medicine.  She has failed more conservative therapy.  She is able to stay active and sleep better with her medicines.  No side effects are reported.  The quality characteristic and distribution of her low back pain and neck pain are otherwise stable without change in lower extremity strength function bowel or bladder function.  No side effects reported.  Review of systems: General: No fevers or chills Pulmonary: No shortness of breath or dyspnea Cardiac: No angina or palpitations or lightheadedness GI: No abdominal pain or constipation Psych: No depression    Observations/Objective:  Current Outpatient Medications:    [START ON 11/23/2022] HYDROcodone-acetaminophen  (NORCO) 7.5-325 MG tablet, Take 1 tablet by mouth every 6 (six) hours as needed for moderate pain or severe pain., Disp: 120 tablet, Rfl: 0   albuterol (ACCUNEB) 0.63 MG/3ML nebulizer solution, Take 1 ampule by nebulization every 6 (six) hours as needed for wheezing or shortness of breath., Disp: , Rfl:    albuterol (VENTOLIN HFA) 108 (90 Base) MCG/ACT inhaler, Inhale 2 puffs into the lungs as needed for shortness of breath., Disp: , Rfl:    amiodarone (PACERONE) 100 MG tablet, Take 1 tablet (100 mg total) by mouth daily., Disp: 90 tablet, Rfl: 3   aspirin 81 MG chewable tablet, Chew 81 mg by mouth daily., Disp: , Rfl:    Calcium Carbonate-Vitamin D 600-200 MG-UNIT TABS, Take 1 tablet by mouth daily. , Disp: , Rfl:    Carboxymethylcellulose Sodium (EYE DROPS OP), Place 2 drops into both eyes as needed (clear eyes)., Disp: , Rfl:    Clobetasol Prop Emollient Base 0.05 % emollient cream, Apply 1 application  topically 2 (two) times daily as needed (skin irritation)., Disp: , Rfl:    CRANBERRY PO, Take 1 tablet by mouth daily., Disp: , Rfl:    DULoxetine (CYMBALTA) 60 MG capsule, Take 60 mg by mouth 2 (two) times daily., Disp: , Rfl:    etanercept (ENBREL) 50 MG/ML injection, Inject 50 mg into the skin every Wednesday., Disp: , Rfl:    fluticasone (FLONASE) 50 MCG/ACT nasal spray, Place 2 sprays into both nostrils at bedtime., Disp: , Rfl:    [START ON 10/24/2022] HYDROcodone-acetaminophen (NORCO) 7.5-325 MG tablet, Take 1 tablet by  mouth every 6 (six) hours as needed for moderate pain or severe pain., Disp: 120 tablet, Rfl: 0   ibuprofen (ADVIL) 200 MG tablet, Take 400 mg by mouth as needed for headache or moderate pain., Disp: , Rfl:    magnesium oxide (MAG-OX) 400 (240 Mg) MG tablet, Take 1 tablet (400 mg total) by mouth daily., Disp: 30 tablet, Rfl: 1   meloxicam (MOBIC) 15 MG tablet, Take 15 mg by mouth daily., Disp: , Rfl:    metoprolol succinate (TOPROL XL) 25 MG 24 hr tablet, Take 1 tablet (25 mg  total) by mouth in the morning and at bedtime. (Patient taking differently: Take 25 mg by mouth daily.), Disp: 180 tablet, Rfl: 3   Multiple Vitamin (MULTI-VITAMIN PO), Take 1 tablet by mouth daily. , Disp: , Rfl:    Omega-3 Fatty Acids (FISH OIL PO), Take 2 capsules by mouth daily., Disp: , Rfl:    omeprazole (PRILOSEC) 40 MG capsule, Take 40 mg by mouth daily., Disp: , Rfl: 1   OSPHENA 60 MG TABS, Take 60 mg by mouth daily., Disp: , Rfl:    rosuvastatin (CRESTOR) 20 MG tablet, Take 20 mg by mouth every Monday., Disp: , Rfl:    Tiotropium Bromide-Olodaterol (STIOLTO RESPIMAT) 2.5-2.5 MCG/ACT AERS, Inhale 1 puff into the lungs in the morning and at bedtime., Disp: 4 g, Rfl: 0   Past Medical History:  Diagnosis Date   Allergic rhinitis    Anxiety    Asthma    Bilateral hip pain 11/28/2018   Colitis    COPD (chronic obstructive pulmonary disease) (HCC)    Fibromyalgia 05/16/2019   GERD (gastroesophageal reflux disease)    With esophageal strictures   Hypertension    Hyperthyroidism    OSA (obstructive sleep apnea) 02/09/2012   No mask.  Did not tolerate   Osteoarthritis    Rheumatoid arthritis (HCC) 10/03/2018     Assessment and Plan: 1. DDD (degenerative disc disease), lumbar   2. Lumbar spondylosis with myelopathy   3. Bilateral hip pain   4. Chronic, continuous use of opioids   5. Chronic pain syndrome   6. Rheumatoid arthritis involving multiple sites with positive rheumatoid factor (HCC)   7. Fibromyalgia   8. Facet arthritis of lumbosacral region   9. Pain in both hands   10. Right sided sciatica    Based on our conversation and after review of the [facility] practitioner database information I think is appropriate to refill her medicines for the next 2 months dated for August 31 and September 30.  I talked her about stretching strengthening exercises and the need to be particularly cautious going from seated to standing and with her balance especially with the  medication combination she is on.  She understands this and is doing so.  Continue with stretching strengthening exercises and ambulation as tolerated.  No other changes in her pharmacologic regimen will be initiated and have encouraged her to continue follow-up with her primary care physicians and cardiologist for ongoing care with return to clinic scheduled in 2 months.  Follow Up Instructions:    I discussed the assessment and treatment plan with the patient. The patient was provided an opportunity to ask questions and all were answered. The patient agreed with the plan and demonstrated an understanding of the instructions.   The patient was advised to call back or seek an in-person evaluation if the symptoms worsen or if the condition fails to improve as anticipated.  I provided 30 minutes  of non-face-to-face time during this encounter.   Yevette Edwards, MD

## 2022-10-29 ENCOUNTER — Telehealth: Payer: Self-pay | Admitting: Anesthesiology

## 2022-10-29 ENCOUNTER — Other Ambulatory Visit: Payer: Self-pay | Admitting: *Deleted

## 2022-10-29 NOTE — Telephone Encounter (Signed)
Confirmed with patient's daughter, requesting transfer of prescription due to being out of stock at current pharmacy. Called [facility], confirmed this, cancelled prescriptions for Hydrocodone ofr 09-2022 and 10-2022. Rx request sent to Dr. Pernell Dupre.

## 2022-10-29 NOTE — Telephone Encounter (Signed)
Can you send patient hydrocodone prescription to [facility] pharmacy. [phone]. TY

## 2022-10-30 ENCOUNTER — Telehealth: Payer: Self-pay | Admitting: Anesthesiology

## 2022-10-30 ENCOUNTER — Other Ambulatory Visit: Payer: Self-pay

## 2022-10-30 MED ORDER — HYDROCODONE-ACETAMINOPHEN 7.5-325 MG PO TABS: 1.0000 | ORAL_TABLET | Freq: Four times a day (QID) | ORAL | 0 refills | Status: AC | PRN

## 2022-10-30 NOTE — Telephone Encounter (Signed)
Refill request sent to Dr Pernell Dupre for [facility] pharmacy because [facility] is out.

## 2022-10-30 NOTE — Telephone Encounter (Signed)
PT daughter call states that she call [name] pharmacy and was told by pharmacy that the hydrocodone prescription hadn't been send it. PT daughter asked if Dr. Pernell Dupre can please send prescription in today. TY

## 2022-10-30 NOTE — Telephone Encounter (Signed)
REfill request has been sent to Dr Pernell Dupre along with a bubble message.

## 2022-11-04 MED ORDER — HYDROCODONE-ACETAMINOPHEN 7.5-325 MG PO TABS: 1.0000 | ORAL_TABLET | Freq: Four times a day (QID) | ORAL | 0 refills | Status: DC | PRN

## 2022-12-07 ENCOUNTER — Ambulatory Visit: Payer: Medicare HMO | Attending: Anesthesiology | Admitting: Anesthesiology

## 2022-12-07 DIAGNOSIS — M25551 Pain in right hip: Secondary | ICD-10-CM

## 2022-12-07 DIAGNOSIS — M0579 Rheumatoid arthritis with rheumatoid factor of multiple sites without organ or systems involvement: Secondary | ICD-10-CM | POA: Diagnosis not present

## 2022-12-07 DIAGNOSIS — M797 Fibromyalgia: Secondary | ICD-10-CM | POA: Diagnosis not present

## 2022-12-07 DIAGNOSIS — M4716 Other spondylosis with myelopathy, lumbar region: Secondary | ICD-10-CM

## 2022-12-07 DIAGNOSIS — F1190 Opioid use, unspecified, uncomplicated: Secondary | ICD-10-CM | POA: Diagnosis not present

## 2022-12-07 DIAGNOSIS — G894 Chronic pain syndrome: Secondary | ICD-10-CM | POA: Diagnosis not present

## 2022-12-07 DIAGNOSIS — M79641 Pain in right hand: Secondary | ICD-10-CM

## 2022-12-07 DIAGNOSIS — M5431 Sciatica, right side: Secondary | ICD-10-CM | POA: Diagnosis not present

## 2022-12-07 DIAGNOSIS — M47817 Spondylosis without myelopathy or radiculopathy, lumbosacral region: Secondary | ICD-10-CM

## 2022-12-07 DIAGNOSIS — M79642 Pain in left hand: Secondary | ICD-10-CM

## 2022-12-07 DIAGNOSIS — M25552 Pain in left hip: Secondary | ICD-10-CM

## 2022-12-07 MED ORDER — HYDROCODONE-ACETAMINOPHEN 7.5-325 MG PO TABS
1.0000 | ORAL_TABLET | Freq: Four times a day (QID) | ORAL | 0 refills | Status: DC | PRN
Start: 2023-01-22 — End: 2022-12-08

## 2022-12-07 MED ORDER — HYDROCODONE-ACETAMINOPHEN 7.5-325 MG PO TABS: 1.0000 | ORAL_TABLET | Freq: Four times a day (QID) | ORAL | 0 refills | Status: DC | PRN

## 2022-12-08 MED ORDER — HYDROCODONE-ACETAMINOPHEN 7.5-325 MG PO TABS: 1.0000 | ORAL_TABLET | Freq: Four times a day (QID) | ORAL | 0 refills | Status: AC | PRN

## 2022-12-08 NOTE — Progress Notes (Signed)
Virtual Visit via Telephone Note  I connected with Kelsey Diaz on 12/08/22 at  2:20 PM EDT by telephone and verified that I am speaking with the correct person using two identifiers.  Location: Patient: Home Provider: Pain control center   I discussed the limitations, risks, security and privacy concerns of performing an evaluation and management service by telephone and the availability of in person appointments. I also discussed with the patient that there may be a patient responsible charge related to this service. The patient expressed understanding and agreed to proceed.   History of Present Illness: I was able to speak with Kelsey Diaz via telephone.  We could not link for the video portion the conference.  She reports that her low back pain has been stable.  No recent changes are noted.  The quality characteristic and distribution remained stable.  She is trying to stay active and taking her medications as prescribed.  These continue to work well for her. .  Otherwise she is in her usual state of health.  Review of systems: General: No fevers or chills Pulmonary: No shortness of breath or dyspnea Cardiac: No angina or palpitations or lightheadedness GI: No abdominal pain or constipation Psych: No depression    Observations/Objective:   Current Outpatient Medications:    [START ON 01/22/2023] HYDROcodone-acetaminophen (NORCO) 7.5-325 MG tablet, Take 1 tablet by mouth every 6 (six) hours as needed for moderate pain or severe pain., Disp: 120 tablet, Rfl: 0   albuterol (ACCUNEB) 0.63 MG/3ML nebulizer solution, Take 1 ampule by nebulization every 6 (six) hours as needed for wheezing or shortness of breath., Disp: , Rfl:    albuterol (VENTOLIN HFA) 108 (90 Base) MCG/ACT inhaler, Inhale 2 puffs into the lungs as needed for shortness of breath., Disp: , Rfl:    amiodarone (PACERONE) 100 MG tablet, Take 1 tablet (100 mg total) by mouth daily., Disp: 90 tablet, Rfl: 3   aspirin 81  MG chewable tablet, Chew 81 mg by mouth daily., Disp: , Rfl:    Calcium Carbonate-Vitamin D 600-200 MG-UNIT TABS, Take 1 tablet by mouth daily. , Disp: , Rfl:    Carboxymethylcellulose Sodium (EYE DROPS OP), Place 2 drops into both eyes as needed (clear eyes)., Disp: , Rfl:    Clobetasol Prop Emollient Base 0.05 % emollient cream, Apply 1 application  topically 2 (two) times daily as needed (skin irritation)., Disp: , Rfl:    CRANBERRY PO, Take 1 tablet by mouth daily., Disp: , Rfl:    DULoxetine (CYMBALTA) 60 MG capsule, Take 60 mg by mouth 2 (two) times daily., Disp: , Rfl:    etanercept (ENBREL) 50 MG/ML injection, Inject 50 mg into the skin every Wednesday., Disp: , Rfl:    fluticasone (FLONASE) 50 MCG/ACT nasal spray, Place 2 sprays into both nostrils at bedtime., Disp: , Rfl:    [START ON 12/23/2022] HYDROcodone-acetaminophen (NORCO) 7.5-325 MG tablet, Take 1 tablet by mouth every 6 (six) hours as needed for moderate pain or severe pain., Disp: 120 tablet, Rfl: 0   ibuprofen (ADVIL) 200 MG tablet, Take 400 mg by mouth as needed for headache or moderate pain., Disp: , Rfl:    magnesium oxide (MAG-OX) 400 (240 Mg) MG tablet, Take 1 tablet (400 mg total) by mouth daily., Disp: 30 tablet, Rfl: 1   meloxicam (MOBIC) 15 MG tablet, Take 15 mg by mouth daily., Disp: , Rfl:    metoprolol succinate (TOPROL XL) 25 MG 24 hr tablet, Take 1 tablet (25 mg total)  by mouth in the morning and at bedtime. (Patient taking differently: Take 25 mg by mouth daily.), Disp: 180 tablet, Rfl: 3   Multiple Vitamin (MULTI-VITAMIN PO), Take 1 tablet by mouth daily. , Disp: , Rfl:    Omega-3 Fatty Acids (FISH OIL PO), Take 2 capsules by mouth daily., Disp: , Rfl:    omeprazole (PRILOSEC) 40 MG capsule, Take 40 mg by mouth daily., Disp: , Rfl: 1   OSPHENA 60 MG TABS, Take 60 mg by mouth daily., Disp: , Rfl:    rosuvastatin (CRESTOR) 20 MG tablet, Take 20 mg by mouth every Monday., Disp: , Rfl:    Tiotropium  Bromide-Olodaterol (STIOLTO RESPIMAT) 2.5-2.5 MCG/ACT AERS, Inhale 1 puff into the lungs in the morning and at bedtime., Disp: 4 g, Rfl: 0   Past Medical History:  Diagnosis Date   Allergic rhinitis    Anxiety    Asthma    Bilateral hip pain 11/28/2018   Colitis    COPD (chronic obstructive pulmonary disease) (HCC)    Fibromyalgia 05/16/2019   GERD (gastroesophageal reflux disease)    With esophageal strictures   Hypertension    Hyperthyroidism    OSA (obstructive sleep apnea) 02/09/2012   No mask.  Did not tolerate   Osteoarthritis    Rheumatoid arthritis (HCC) 10/03/2018    Assessment and Plan: 1. Lumbar spondylosis with myelopathy   2. Bilateral hip pain   3. Chronic, continuous use of opioids   4. Rheumatoid arthritis involving multiple sites with positive rheumatoid factor (HCC)   5. Chronic pain syndrome   6. Fibromyalgia   7. Facet arthritis of lumbosacral region   8. Pain in both hands   9. Right sided sciatica    Patient our conversation today I think it is appropriate to refill her medicines for the next 2 months.  He is to be dated for October 30 and November 29.  No other changes in her pharmacologic regimen will be initiated.  She continues to do well with this.  We will schedule her for 2 months return to clinic.  I encouraged her to continue with stretching strengthening exercises and follow-up with her primary care physicians for baseline medical care.  Follow Up Instructions:    I discussed the assessment and treatment plan with the patient. The patient was provided an opportunity to ask questions and all were answered. The patient agreed with the plan and demonstrated an understanding of the instructions.   The patient was advised to call back or seek an in-person evaluation if the symptoms worsen or if the condition fails to improve as anticipated.  I provided 30 minutes of non-face-to-face time during this encounter.   Yevette Edwards, MD

## 2023-02-01 DIAGNOSIS — R350 Frequency of micturition: Secondary | ICD-10-CM | POA: Diagnosis not present

## 2023-02-01 DIAGNOSIS — N898 Other specified noninflammatory disorders of vagina: Secondary | ICD-10-CM | POA: Diagnosis not present

## 2023-02-01 DIAGNOSIS — R3915 Urgency of urination: Secondary | ICD-10-CM | POA: Diagnosis not present

## 2023-02-04 DIAGNOSIS — R350 Frequency of micturition: Secondary | ICD-10-CM | POA: Diagnosis not present

## 2023-02-10 DIAGNOSIS — R300 Dysuria: Secondary | ICD-10-CM | POA: Diagnosis not present

## 2023-03-15 ENCOUNTER — Ambulatory Visit (INDEPENDENT_AMBULATORY_CARE_PROVIDER_SITE_OTHER)
Admission: RE | Admit: 2023-03-15 | Discharge: 2023-03-15 | Disposition: A | Discharge status: Home / Self Care | Payer: Medicare HMO | Source: Ambulatory Visit | Attending: General Practice | Admitting: General Practice

## 2023-03-15 ENCOUNTER — Encounter: Payer: Self-pay | Admitting: General Practice

## 2023-03-15 ENCOUNTER — Ambulatory Visit (INDEPENDENT_AMBULATORY_CARE_PROVIDER_SITE_OTHER): Payer: Medicare HMO | Admitting: General Practice

## 2023-03-15 VITALS — BP 138/82 | HR 54 | Temp 98.0°F | Ht 65.5 in | Wt 205.0 lb

## 2023-03-15 DIAGNOSIS — J4489 Other specified chronic obstructive pulmonary disease: Secondary | ICD-10-CM

## 2023-03-15 DIAGNOSIS — N390 Urinary tract infection, site not specified: Secondary | ICD-10-CM

## 2023-03-15 DIAGNOSIS — M25512 Pain in left shoulder: Secondary | ICD-10-CM | POA: Insufficient documentation

## 2023-03-15 DIAGNOSIS — I10 Essential (primary) hypertension: Secondary | ICD-10-CM

## 2023-03-15 DIAGNOSIS — G4700 Insomnia, unspecified: Secondary | ICD-10-CM | POA: Diagnosis not present

## 2023-03-15 DIAGNOSIS — G4733 Obstructive sleep apnea (adult) (pediatric): Secondary | ICD-10-CM

## 2023-03-15 DIAGNOSIS — Z7689 Persons encountering health services in other specified circumstances: Secondary | ICD-10-CM | POA: Diagnosis not present

## 2023-03-15 DIAGNOSIS — M0579 Rheumatoid arthritis with rheumatoid factor of multiple sites without organ or systems involvement: Secondary | ICD-10-CM

## 2023-03-15 DIAGNOSIS — I472 Ventricular tachycardia, unspecified: Secondary | ICD-10-CM

## 2023-03-15 DIAGNOSIS — M85812 Other specified disorders of bone density and structure, left shoulder: Secondary | ICD-10-CM | POA: Diagnosis not present

## 2023-03-15 DIAGNOSIS — M797 Fibromyalgia: Secondary | ICD-10-CM

## 2023-03-15 NOTE — Assessment & Plan Note (Signed)
Followed by rheumatology.   Continue duloxetine 60 mg twice daily.

## 2023-03-15 NOTE — Progress Notes (Signed)
New Patient Office Visit  Subjective    Patient ID: Kelsey Diaz, female    DOB: [birth year]  Age: 78 y.o. MRN: [id number]  CC:  Chief Complaint  Patient presents with   Establish Care    HPI Kelsey Diaz is a 78 y.o. female presents today with her daughter [name], to establish care.   Previous PCP/physical/labs: Laurann Montana, MD. Last physical and labs was within the year.   Hypertension, Ventricular tachycardia- Is followed by cardiology. She is currently managed on amiodarone 100 mg, aspirin 81 mg once daily, metoprolol succinate 25 mg twice daily. She increases her metoprolol to 50 BID at times. Her home BP readings range between 120s-150s systolic and 70s-upper 80s diastolic. She does have intermittent dizziness after she started the amiodrone and has discussed this with cardiologist. Her dose was recently decreased. She denies any chest pain or shortness of breath.   COPD/OSA: is followed by pulmonology. Currently managed on Albuterol neb treatment and tiotropium 2.5-2.5 mcg/act aers and Flonase. Does not use the CPAP. Her last sleep study was many years ago. She denies any difficulty breathing or shortness of breath.   Rheumatoid Arthritis/Fibromyalgia: followed by rheumatology. Currently managed on etanercept weekly for RA.  currently managed on cymbalta 60 mg once daily for fibromyalgia. No concerns today.   Recurrent UTIs- followed by urology. Takes cranberry daily. Has been septic in the past. Denies any dysuria, hematuria, burning.   Chronic back pain: followed by pain management clinic. Has had back injections in the past. Currently managed with hydrocodone-acetaminophen. No concerns today.   Insomnia: Chronic. She can go 1-2 days without sleeping at all. It is hard to fall asleep. She drinks a 12 oz cup of coffee in the morning. She does not take any naps. She does not exercise. She usually is in the recliner at home. She does take care of the laundry and cleaning  up the house. Does watch tv at night.   Left shoulder pain: recent fall two weeks ago. She went to the ER to be evaluated. Since her visit, she had developed left shoulder pain. She is not able to move her shoulder over her head. It is tender. She does feel that it is improving but is not 100%.   Outpatient Encounter Medications as of 03/15/2023  Medication Sig   albuterol (ACCUNEB) 0.63 MG/3ML nebulizer solution Take 1 ampule by nebulization every 6 (six) hours as needed for wheezing or shortness of breath.   albuterol (VENTOLIN HFA) 108 (90 Base) MCG/ACT inhaler Inhale 2 puffs into the lungs as needed for shortness of breath.   amiodarone (PACERONE) 100 MG tablet Take 1 tablet (100 mg total) by mouth daily.   aspirin 81 MG chewable tablet Chew 81 mg by mouth daily.   Calcium Carbonate-Vitamin D 600-200 MG-UNIT TABS Take 1 tablet by mouth daily.    Carboxymethylcellulose Sodium (EYE DROPS OP) Place 2 drops into both eyes as needed (clear eyes).   Clobetasol Prop Emollient Base 0.05 % emollient cream Apply 1 application  topically 2 (two) times daily as needed (skin irritation).   CRANBERRY PO Take 1 tablet by mouth daily.   DULoxetine (CYMBALTA) 60 MG capsule Take 60 mg by mouth 2 (two) times daily.   etanercept (ENBREL) 50 MG/ML injection Inject 50 mg into the skin every Wednesday.   fluticasone (FLONASE) 50 MCG/ACT nasal spray Place 2 sprays into both nostrils at bedtime.   HYDROcodone-acetaminophen (NORCO) 7.5-325 MG tablet Take 1 tablet by mouth every  6 (six) hours as needed for moderate pain (pain score 4-6).   ibuprofen (ADVIL) 200 MG tablet Take 400 mg by mouth as needed for headache or moderate pain.   magnesium oxide (MAG-OX) 400 (240 Mg) MG tablet Take 1 tablet (400 mg total) by mouth daily.   meloxicam (MOBIC) 15 MG tablet Take 15 mg by mouth daily.   metoprolol succinate (TOPROL XL) 25 MG 24 hr tablet Take 1 tablet (25 mg total) by mouth in the morning and at bedtime. (Patient taking  differently: Take 25 mg by mouth daily.)   Multiple Vitamin (MULTI-VITAMIN PO) Take 1 tablet by mouth daily.    Omega-3 Fatty Acids (FISH OIL PO) Take 2 capsules by mouth daily.   omeprazole (PRILOSEC) 40 MG capsule Take 40 mg by mouth daily.   rosuvastatin (CRESTOR) 20 MG tablet Take 20 mg by mouth every Monday.   Tiotropium Bromide-Olodaterol (STIOLTO RESPIMAT) 2.5-2.5 MCG/ACT AERS Inhale 1 puff into the lungs in the morning and at bedtime.   [DISCONTINUED] OSPHENA 60 MG TABS Take 60 mg by mouth daily.   No facility-administered encounter medications on file as of 03/15/2023.    Past Medical History:  Diagnosis Date   Abnormal nuclear stress test 06/03/2017   Allergic rhinitis    Anxiety    Asthma    Bacteremia 04/05/2021   Bilateral hip pain 11/28/2018   Chest pain 06/03/2017   Colitis    COPD (chronic obstructive pulmonary disease) (HCC)    Dizzy 06/13/2012   E-coli UTI 04/04/2021   Educated about COVID-19 virus infection 07/02/2019   Fibromyalgia 05/16/2019   GERD (gastroesophageal reflux disease)    With esophageal strictures   Hand pain 11/28/2018   Hypertension    Hyperthyroidism    Hypomagnesemia 05/03/2022   Normocytic anemia 05/03/2022   OSA (obstructive sleep apnea) 02/09/2012   No mask.  Did not tolerate   Osteoarthritis    PNA (pneumonia) 09/13/2017   Rheumatoid arthritis (HCC) 10/03/2018   Sepsis due to Escherichia coli (E. coli) (HCC) 04/05/2021   SOB (shortness of breath) 07/02/2019   Thrombocytopenia (HCC) 05/03/2022    Past Surgical History:  Procedure Laterality Date   BREAST BIOPSY     x2   CATARACT EXTRACTION W/PHACO Left 09/27/2018   Procedure: CATARACT EXTRACTION PHACO AND INTRAOCULAR LENS PLACEMENT (IOC) LEFT;  Surgeon: Galen Manila, MD;  Location: [facility];  Service: Ophthalmology;  Laterality: Left;   CATARACT EXTRACTION W/PHACO Right 11/29/2018   Procedure: CATARACT EXTRACTION PHACO AND INTRAOCULAR LENS PLACEMENT (IOC) RIGHT;   Surgeon: Galen Manila, MD;  Location: [facility];  Service: Ophthalmology;  Laterality: Right;  1:22 20.1% 16.64   CHOLECYSTECTOMY     2005   KNEE SURGERY Right 2013   two torn ligaments repaired.    LEFT HEART CATH AND CORONARY ANGIOGRAPHY N/A 06/08/2017   Procedure: LEFT HEART CATH AND CORONARY ANGIOGRAPHY;  Surgeon: Lennette Bihari, MD;  Location: [facility];  Service: Cardiovascular;  Laterality: N/A;   LEFT HEART CATH AND CORONARY ANGIOGRAPHY N/A 12/12/2020   Procedure: LEFT HEART CATH AND CORONARY ANGIOGRAPHY;  Surgeon: Runell Gess, MD;  Location: [facility];  Service: Cardiovascular;  Laterality: N/A;   NASAL SINUS SURGERY     V TACH ABLATION N/A 12/16/2020   Procedure: V TACH ABLATION;  Surgeon: Lanier Prude, MD;  Location: [facility];  Service: Cardiovascular;  Laterality: N/A;   VAGINAL HYSTERECTOMY      Family History  Problem Relation Age of Onset   Hypertension Mother    Diabetes Mother    Allergies Mother    Heart attack Father 52   Cancer Father        kidney   Emphysema Father    Heart disease Father        CHF age 37   Heart attack Brother 9       Died of MI age 4    Social History   Socioeconomic History   Marital status: Married    Spouse name: Not on file   Number of children: 3   Years of education: Not on file   Highest education level: Not on file  Occupational History   Occupation: Retired    Associate Professor: [employer]  Tobacco Use   Smoking status: Former    Current packs/day: 0.00    Average packs/day: 2.0 packs/day for 40.0 years (80.0 ttl pk-yrs)    Types: Cigarettes    Start date: 02/24/1959    Quit date: 02/24/1999    Years since quitting: 24.0   Smokeless tobacco: Never  Vaping Use   Vaping status: Never Used  Substance and Sexual Activity   Alcohol use: Not Currently    Comment:     Drug use: No   Sexual activity: Not on file  Other Topics Concern   Not on file  Social History Narrative    Lives with husband.  5 children together. (She has 3).     Social Drivers of Corporate investment banker Strain: Not on file  Food Insecurity: No Food Insecurity (05/15/2022)   Hunger Vital Sign    Worried About Running Out of Food in the Last Year: Never true    Ran Out of Food in the Last Year: Never true  Transportation Needs: No Transportation Needs (05/15/2022)   PRAPARE - Administrator, Civil Service (Medical): No    Lack of Transportation (Non-Medical): No  Physical Activity: Not on file  Stress: Not on file  Social Connections: Unknown (07/08/2021)   Received from [facility]   Social Network    Social Network: Not on file  Intimate Partner Violence: Not At Risk (05/15/2022)   Humiliation, Afraid, Rape, and Kick questionnaire    Fear of Current or Ex-Partner: No    Emotionally Abused: No    Physically Abused: No    Sexually Abused: No    Review of Systems  Constitutional:  Negative for chills and fever.  Respiratory:  Negative for shortness of breath.   Cardiovascular:  Negative for chest pain and leg swelling.  Genitourinary:  Negative for dysuria, frequency and urgency.  Musculoskeletal:  Positive for falls.       Left shoulder pain.  Neurological:  Positive for dizziness. Negative for headaches.       Dizziness intermittent.  Endo/Heme/Allergies:  Negative for polydipsia.  Psychiatric/Behavioral:  Negative for depression and suicidal ideas. The patient has insomnia. The patient is not nervous/anxious.         Objective    BP 138/82   Pulse (!) 54   Temp 98 F (36.7 C) (Temporal)   Ht 5' 5.5" (1.664 m)   Wt 205 lb (93 kg)   SpO2 93%   BMI 33.59 kg/m   Physical Exam Vitals and nursing note reviewed.  Constitutional:      Appearance: Normal appearance.  Cardiovascular:     Rate and Rhythm: Normal rate and regular rhythm.     Pulses:  Normal pulses.     Heart sounds: Normal heart sounds.  Pulmonary:     Effort: Pulmonary  effort is normal.     Breath sounds: Normal breath sounds.  Musculoskeletal:     Right shoulder: Normal.     Left shoulder: Tenderness present. No swelling, effusion, laceration or bony tenderness. Decreased range of motion.     Comments: Limited ROM with left shoulder.  Neurological:     Mental Status: She is alert and oriented to person, place, and time.  Psychiatric:        Mood and Affect: Mood normal.        Behavior: Behavior normal.        Thought Content: Thought content normal.        Judgment: Judgment normal.         Assessment & Plan:  Acute pain of left shoulder Assessment & Plan: Limited ROM on exam.   Empty can test negative.   Differentials include Muscle sprain vs strain or fracture.   X-ray pending. Await results.   Already on norco for back pain. Will utilize it if needed.  Orders: -     DG Shoulder Left  Encounter to establish care with new doctor Assessment & Plan: EMR Reviewed briefly.   Insomnia, unspecified type Assessment & Plan: Unclear etiology.   Discussed sleep habits and good sleep hygiene with patient and daughter.   She will try those things and will update in two weeks.   Given age, resistant to try sleeping agents.   Recommend trying taking scheduled magnesium at night.    Rheumatoid arthritis involving multiple sites with positive rheumatoid factor (HCC) Assessment & Plan: On enbrel weekly.   Followed by rheumatology.    COPD with asthma (HCC) Assessment & Plan: Controlled.   Continue Albuterol inhaler and nebs as needed.  Continue Stiolto respimat once daily.   She will schedule follow up with pulmonology.   VT (ventricular tachycardia) (HCC)  Ventricular tachycardia (HCC) Assessment & Plan: Controlled. Followed by cardiology.  Continue amiodarone, metoprolol as scheduled.   OSA (obstructive sleep apnea) Assessment & Plan: Non-compliant with CPAP.   Recommend an updated sleep-study. Followed by  pulmonology.  Her daughter will call and schedule appointment.  Discussed the importance of compliance with CPAP.   Hypertension, unspecified type Assessment & Plan: Previously managed on valsartan.   BP elevated on first reading but at goal with second reading.   Continue amiodarone and metoprolol.   Followed by cardiology.   Fibromyalgia Assessment & Plan: Followed by rheumatology.   Continue duloxetine 60 mg twice daily.    Recurrent UTI Assessment & Plan: Nothing recent.  No concerns today.   Followed by urology.   Continue Cranberry PO daily.      Return in about 2 weeks (around 03/29/2023) for legs, insomnia and shoulder.   Modesto Charon, NP

## 2023-03-15 NOTE — Assessment & Plan Note (Signed)
Limited ROM on exam.   Empty can test negative.   Differentials include Muscle sprain vs strain or fracture.   X-ray pending. Await results.   Already on norco for back pain. Will utilize it if needed.

## 2023-03-15 NOTE — Assessment & Plan Note (Signed)
Unclear etiology.   Discussed sleep habits and good sleep hygiene with patient and daughter.   She will try those things and will update in two weeks.   Given age, resistant to try sleeping agents.   Recommend trying taking scheduled magnesium at night.

## 2023-03-15 NOTE — Assessment & Plan Note (Signed)
EMR Reviewed briefly.

## 2023-03-15 NOTE — Assessment & Plan Note (Signed)
Controlled. Followed by cardiology.  Continue amiodarone, metoprolol as scheduled.

## 2023-03-15 NOTE — Assessment & Plan Note (Signed)
Previously managed on valsartan.   BP elevated on first reading but at goal with second reading.   Continue amiodarone and metoprolol.   Followed by cardiology.

## 2023-03-15 NOTE — Assessment & Plan Note (Signed)
Non-compliant with CPAP.   Recommend an updated sleep-study. Followed by pulmonology.  Her daughter will call and schedule appointment.  Discussed the importance of compliance with CPAP.

## 2023-03-15 NOTE — Assessment & Plan Note (Signed)
Nothing recent.  No concerns today.   Followed by urology.   Continue Cranberry PO daily.

## 2023-03-15 NOTE — Assessment & Plan Note (Signed)
Controlled.   Continue Albuterol inhaler and nebs as needed.  Continue Stiolto respimat once daily.   She will schedule follow up with pulmonology.

## 2023-03-15 NOTE — Assessment & Plan Note (Signed)
On enbrel weekly.   Followed by rheumatology.

## 2023-03-15 NOTE — Patient Instructions (Addendum)
Continue medications as prescribed.   Try new sleeping habits as discussed- no tv right before bed, getting out and getting some sunlight, try pain medication and magnesium or sleep medication night.   Schedule appointment with pulmonology.   Schedule 2 week follow up.   It was a pleasure meeting you!

## 2023-03-25 ENCOUNTER — Ambulatory Visit: Payer: Medicare HMO | Attending: Anesthesiology | Admitting: Anesthesiology

## 2023-03-25 ENCOUNTER — Encounter: Payer: Self-pay | Admitting: Anesthesiology

## 2023-03-25 DIAGNOSIS — M25551 Pain in right hip: Secondary | ICD-10-CM | POA: Diagnosis not present

## 2023-03-25 DIAGNOSIS — G894 Chronic pain syndrome: Secondary | ICD-10-CM

## 2023-03-25 DIAGNOSIS — M0579 Rheumatoid arthritis with rheumatoid factor of multiple sites without organ or systems involvement: Secondary | ICD-10-CM

## 2023-03-25 DIAGNOSIS — M4716 Other spondylosis with myelopathy, lumbar region: Secondary | ICD-10-CM | POA: Diagnosis not present

## 2023-03-25 DIAGNOSIS — M5431 Sciatica, right side: Secondary | ICD-10-CM | POA: Diagnosis not present

## 2023-03-25 DIAGNOSIS — F1190 Opioid use, unspecified, uncomplicated: Secondary | ICD-10-CM

## 2023-03-25 DIAGNOSIS — M797 Fibromyalgia: Secondary | ICD-10-CM | POA: Diagnosis not present

## 2023-03-25 DIAGNOSIS — M79641 Pain in right hand: Secondary | ICD-10-CM

## 2023-03-25 DIAGNOSIS — M79642 Pain in left hand: Secondary | ICD-10-CM

## 2023-03-25 DIAGNOSIS — M25552 Pain in left hip: Secondary | ICD-10-CM

## 2023-03-25 DIAGNOSIS — M47817 Spondylosis without myelopathy or radiculopathy, lumbosacral region: Secondary | ICD-10-CM

## 2023-03-25 MED ORDER — HYDROCODONE-ACETAMINOPHEN 7.5-325 MG PO TABS: 1.0000 | ORAL_TABLET | Freq: Four times a day (QID) | ORAL | 0 refills | Status: AC | PRN

## 2023-03-25 MED ORDER — HYDROCODONE-ACETAMINOPHEN 7.5-325 MG PO TABS
1.0000 | ORAL_TABLET | Freq: Four times a day (QID) | ORAL | 0 refills | Status: DC | PRN
Start: 2023-04-24 — End: 2023-05-18

## 2023-03-25 NOTE — Progress Notes (Signed)
Virtual Visit via Telephone Note  I connected with Kelsey Diaz on 03/25/23 at  8:40 AM EST by telephone and verified that I am speaking with the correct person using two identifiers.  Location: Patient: Home Provider: Pain control center   I discussed the limitations, risks, security and privacy concerns of performing an evaluation and management service by telephone and the availability of in person appointments. I also discussed with the patient that there may be a patient responsible charge related to this service. The patient expressed understanding and agreed to proceed.   History of Present Illness: I spoke with Kelsey Diaz via telephone as we could not link for the video portion of the conference.  She reports that she has recently been in the hospital.  This was attributed to some dizziness that her cardiology doctors feel is secondary to the amiodarone that she is taking.  They have cut this drug back and she is feeling better.  She has a history of some arrhythmia and ventricular tachycardia which is being monitored.  She is doing better.  Unfortunately, her situation is complicated secondary to the chronic diffuse body pain she experiences.  She recently ran out of medication and has been miserable as reported today.  She has severe pain secondary to rheumatoid arthritis that affects her diffusely in the hands shoulders hips knees and low back.  With the hydrocodone 7.5 mg tablets she gets good relief rated about 50 to 75% and can be comfortable at rest be more ambulatory and sleep better at night.  Without the medication she is in chronic continuous pain and reports being quite miserable.  She does not attribute the dizziness to any relationship with the opioid medications.  This symptom is better now that they have cut back on her amiodarone as reported today.  Otherwise no change in lower extremity strength function or bowel or bladder function is reported today.  Review of  systems: General: No fevers or chills Pulmonary: No shortness of breath or dyspnea Cardiac: No angina or palpitations or lightheadedness GI: No abdominal pain or constipation Psych: No depression    Observations/Objective:  Current Outpatient Medications:    [START ON 04/24/2023] HYDROcodone-acetaminophen (NORCO) 7.5-325 MG tablet, Take 1 tablet by mouth every 6 (six) hours as needed for moderate pain (pain score 4-6) or severe pain (pain score 7-10)., Disp: 90 tablet, Rfl: 0   albuterol (ACCUNEB) 0.63 MG/3ML nebulizer solution, Take 1 ampule by nebulization every 6 (six) hours as needed for wheezing or shortness of breath., Disp: , Rfl:    albuterol (VENTOLIN HFA) 108 (90 Base) MCG/ACT inhaler, Inhale 2 puffs into the lungs as needed for shortness of breath., Disp: , Rfl:    amiodarone (PACERONE) 100 MG tablet, Take 1 tablet (100 mg total) by mouth daily., Disp: 90 tablet, Rfl: 3   aspirin 81 MG chewable tablet, Chew 81 mg by mouth daily., Disp: , Rfl:    Calcium Carbonate-Vitamin D 600-200 MG-UNIT TABS, Take 1 tablet by mouth daily. , Disp: , Rfl:    Carboxymethylcellulose Sodium (EYE DROPS OP), Place 2 drops into both eyes as needed (clear eyes)., Disp: , Rfl:    Clobetasol Prop Emollient Base 0.05 % emollient cream, Apply 1 application  topically 2 (two) times daily as needed (skin irritation)., Disp: , Rfl:    CRANBERRY PO, Take 1 tablet by mouth daily., Disp: , Rfl:    DULoxetine (CYMBALTA) 60 MG capsule, Take 60 mg by mouth 2 (two) times daily., Disp: ,  Rfl:    etanercept (ENBREL) 50 MG/ML injection, Inject 50 mg into the skin every Wednesday., Disp: , Rfl:    fluticasone (FLONASE) 50 MCG/ACT nasal spray, Place 2 sprays into both nostrils at bedtime., Disp: , Rfl:    HYDROcodone-acetaminophen (NORCO) 7.5-325 MG tablet, Take 1 tablet by mouth every 6 (six) hours as needed for moderate pain (pain score 4-6)., Disp: 90 tablet, Rfl: 0   ibuprofen (ADVIL) 200 MG tablet, Take 400 mg by mouth  as needed for headache or moderate pain., Disp: , Rfl:    magnesium oxide (MAG-OX) 400 (240 Mg) MG tablet, Take 1 tablet (400 mg total) by mouth daily., Disp: 30 tablet, Rfl: 1   meloxicam (MOBIC) 15 MG tablet, Take 15 mg by mouth daily., Disp: , Rfl:    metoprolol succinate (TOPROL XL) 25 MG 24 hr tablet, Take 1 tablet (25 mg total) by mouth in the morning and at bedtime. (Patient taking differently: Take 25 mg by mouth daily.), Disp: 180 tablet, Rfl: 3   Multiple Vitamin (MULTI-VITAMIN PO), Take 1 tablet by mouth daily. , Disp: , Rfl:    Omega-3 Fatty Acids (FISH OIL PO), Take 2 capsules by mouth daily., Disp: , Rfl:    omeprazole (PRILOSEC) 40 MG capsule, Take 40 mg by mouth daily., Disp: , Rfl: 1   rosuvastatin (CRESTOR) 20 MG tablet, Take 20 mg by mouth every Monday., Disp: , Rfl:    Tiotropium Bromide-Olodaterol (STIOLTO RESPIMAT) 2.5-2.5 MCG/ACT AERS, Inhale 1 puff into the lungs in the morning and at bedtime., Disp: 4 g, Rfl: 0   Past Medical History:  Diagnosis Date   Abnormal nuclear stress test 06/03/2017   Allergic rhinitis    Anxiety    Asthma    Bacteremia 04/05/2021   Bilateral hip pain 11/28/2018   Chest pain 06/03/2017   Colitis    COPD (chronic obstructive pulmonary disease) (HCC)    Dizzy 06/13/2012   E-coli UTI 04/04/2021   Educated about COVID-19 virus infection 07/02/2019   Fibromyalgia 05/16/2019   GERD (gastroesophageal reflux disease)    With esophageal strictures   Hand pain 11/28/2018   Hypertension    Hyperthyroidism    Hypomagnesemia 05/03/2022   Normocytic anemia 05/03/2022   OSA (obstructive sleep apnea) 02/09/2012   No mask.  Did not tolerate   Osteoarthritis    PNA (pneumonia) 09/13/2017   Rheumatoid arthritis (HCC) 10/03/2018   Sepsis due to Escherichia coli (E. coli) (HCC) 04/05/2021   SOB (shortness of breath) 07/02/2019   Thrombocytopenia (HCC) 05/03/2022   Assessment and Plan:  1. Lumbar spondylosis with myelopathy   2. Bilateral hip  pain   3. Chronic, continuous use of opioids   4. Rheumatoid arthritis involving multiple sites with positive rheumatoid factor (HCC)   5. Chronic pain syndrome   6. Fibromyalgia   7. Facet arthritis of lumbosacral region   8. Pain in both hands   9. Right sided sciatica    Based on our conversation today and after review of the [facility] practitioner database information I think it is appropriate to refill her hydrocodone for 3 times a day dosing.  She has been on chronic opioid management for a extended period of time secondary to the severity and diffuse nature of her pain syndrome.  She is done well with this historically.  Refills to be generated for January 30 and March 1.  Will schedule her for in person return to clinic in 1 month.  Continue current medication management additionally  and continue follow-up with her cardiology doctors for routine evaluation and management.  Continue with her primary care physicians for baseline medical care.  Continue with efforts at stretching as tolerated with increased activity with warmer weather as tolerated. Follow Up Instructions:    I discussed the assessment and treatment plan with the patient. The patient was provided an opportunity to ask questions and all were answered. The patient agreed with the plan and demonstrated an understanding of the instructions.   The patient was advised to call back or seek an in-person evaluation if the symptoms worsen or if the condition fails to improve as anticipated.  I provided 30 minutes of non-face-to-face time during this encounter.   Yevette Edwards, MD

## 2023-03-30 ENCOUNTER — Encounter: Payer: Self-pay | Admitting: General Practice

## 2023-03-30 ENCOUNTER — Ambulatory Visit: Payer: Medicare HMO | Admitting: General Practice

## 2023-03-30 VITALS — BP 116/74 | HR 69 | Temp 98.2°F | Ht 65.5 in | Wt 205.0 lb

## 2023-03-30 DIAGNOSIS — M79604 Pain in right leg: Secondary | ICD-10-CM

## 2023-03-30 DIAGNOSIS — I10 Essential (primary) hypertension: Secondary | ICD-10-CM

## 2023-03-30 DIAGNOSIS — M25512 Pain in left shoulder: Secondary | ICD-10-CM | POA: Diagnosis not present

## 2023-03-30 DIAGNOSIS — M79605 Pain in left leg: Secondary | ICD-10-CM

## 2023-03-30 DIAGNOSIS — G4700 Insomnia, unspecified: Secondary | ICD-10-CM | POA: Diagnosis not present

## 2023-03-30 DIAGNOSIS — G4733 Obstructive sleep apnea (adult) (pediatric): Secondary | ICD-10-CM | POA: Diagnosis not present

## 2023-03-30 NOTE — Assessment & Plan Note (Signed)
Non-compliant with CPAP. This could be contributing to her insomnia.  She has not called the pulmonologist.   She will call the pulmonologist and get the sleep study and discuss her COPD.

## 2023-03-30 NOTE — Assessment & Plan Note (Signed)
 Unclear etiology.   Discussed sleep habits and to avoid daytime naps.   Given age and her cardiac history, many sleep agents contraindicate pharmacotherapy route.  Discussed sleep study.  Declines CBT at this time.  Continue magnesium  and melatonin as needed.

## 2023-03-30 NOTE — Assessment & Plan Note (Signed)
 At goal today

## 2023-03-30 NOTE — Assessment & Plan Note (Signed)
Suspect this could be related to fibromyalgia.   Discussed avoid sitting for long periods of time.  She is currently managed on Cymbalta 60 mg BID.   Recommend home health PT. Orders placed.

## 2023-03-30 NOTE — Assessment & Plan Note (Signed)
Improving.   X-ray showed osteopenia.  Referral placed for home health physical therapy.   Discussed using norco as needed (given by pain clinic). Rest and ice/heat as needed.

## 2023-03-30 NOTE — Progress Notes (Signed)
 Established Patient Office Visit  Subjective   Patient ID: Kelsey Diaz, female    DOB: 1945/10/23  Age: 78 y.o. MRN: [id number]  Chief Complaint  Patient presents with   Follow-up    On Insomnia, left shoulder pain and bilateral leg pain.    HPI  Kelsey Diaz is a 78 year old female with past medical history HTN, ventricular tachycardia, COPD with asthma, OSA, RA, recurrent UTI, fibromyalgia, insomnia presents today for a follow up to discuss shoulder pain, insomnia and bilateral leg pain.   Her son is with her today.   Bilateral Leg pain: chronic and getting worse. Pain is located anteriorly knees and radiates down to her ankles. Pain is intermittent. Pain is dull and achy pain. It affects her walk but usually hurts worse while sitting. Does not hurt as much when walking. She uses biofreeze and compression stocking to help with pain while she's sleeping. She is followed by pain management however she has not discussed this with Dr. Myra. She uses a rollator for ambulation.   Left Shoulder pain: she reports that her pain is getting better. She had a left shoulder x-ray on 03/15/23 which showed osteopenia of the shoulder. She has started calcium  and vitamin d . She has been using hydrocodone  sparingly.   Insomnia: She has cut back on watching tv at bedtime. She has started magnesium . She can still skip about 24 hours without sleeping and will take a nap the following day and then will sleep better the next day. She does not drink caffeine after 12 in the afternoon. Usually she goes to bed at 11 but lately its been 1 - 2 am. She will lay in bed 5 am or 6 am without sleeping. She does not have any mind racing thoughts, she said all of that has improved. After she has not slept for 24 hours, when she is able to sleep she sleeps for 8-9 hours.      03/30/2023    2:38 PM 03/15/2023    3:39 PM 04/06/2022   12:59 PM  PHQ9 SCORE ONLY  PHQ-9 Total Score 13 10 0      03/30/2023    2:38 PM  03/15/2023    3:39 PM  GAD 7 : Generalized Anxiety Score  Nervous, Anxious, on Edge 1 1  Control/stop worrying 0 0  Worry too much - different things 1 1  Trouble relaxing 0 3  Restless 0 0  Easily annoyed or irritable 0 1  Afraid - awful might happen 0 0  Total GAD 7 Score 2 6  Anxiety Difficulty Somewhat difficult Not difficult at all       Patient Active Problem List   Diagnosis Date Noted   Bilateral leg pain 03/30/2023   Acute pain of left shoulder 03/15/2023   Encounter to establish care with new doctor 03/15/2023   Insomnia 03/15/2023   Recurrent UTI 04/06/2021   Ventricular tachycardia (HCC) 12/11/2020   Fibromyalgia 05/16/2019   Rheumatoid arthritis (HCC) 10/03/2018   HTN (hypertension) 09/13/2017   OSA (obstructive sleep apnea) 02/09/2012   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   Past Medical History:  Diagnosis Date   Abnormal nuclear stress test 06/03/2017   Allergic rhinitis    Anxiety    Asthma    Bacteremia 04/05/2021   Bilateral hip pain 11/28/2018   Chest pain 06/03/2017   Colitis    COPD (chronic obstructive pulmonary disease) (HCC)    Dizzy 06/13/2012   E-coli UTI 04/04/2021  Educated about COVID-19 virus infection 07/02/2019   Fibromyalgia 05/16/2019   GERD (gastroesophageal reflux disease)    With esophageal strictures   Hand pain 11/28/2018   Hypertension    Hyperthyroidism    Hypomagnesemia 05/03/2022   Normocytic anemia 05/03/2022   OSA (obstructive sleep apnea) 02/09/2012   No mask.  Did not tolerate   Osteoarthritis    PNA (pneumonia) 09/13/2017   Rheumatoid arthritis (HCC) 10/03/2018   Sepsis due to Escherichia coli (E. coli) (HCC) 04/05/2021   SOB (shortness of breath) 07/02/2019   Thrombocytopenia (HCC) 05/03/2022   Allergies  Allergen Reactions   Morphine Other (See Comments)    Stopped heart   Advair Diskus [Fluticasone -Salmeterol] Other (See Comments)    Sensitivity to smells   Ambien [Zolpidem Tartrate] Other (See  Comments)    irritable   Amlodipine Besylate Other (See Comments)    Feels bad   Azithromycin  Other (See Comments)     Unknown   Bextra [Valdecoxib] Other (See Comments)    Stomach issues   Bupropion Other (See Comments)    involuntary muscle movements   Captopril Other (See Comments)    Ache   Irbesartan  Other (See Comments)    dizzy   Latex Itching    Bandaids only   Lisinopril Cough   Pacerone  [Amiodarone ] Other (See Comments)    falls   Pulmicort [Budesonide] Other (See Comments)    Worse breathing, sensitivity to smells   Savella [Milnacipran Hcl] Other (See Comments)    Mental fog   Spiriva [Tiotropium Bromide Monohydrate] Other (See Comments)    Dry mouth   Suvorexant Other (See Comments)     not effective   Tiotropium Bromide Monohydrate Other (See Comments)     dry mouth         03/30/2023    2:38 PM 03/15/2023    3:39 PM 04/06/2022   12:59 PM  Depression screen PHQ 2/9  Decreased Interest 1 2 0  Down, Depressed, Hopeless 0 1 0  PHQ - 2 Score 1 3 0  Altered sleeping 3 3   Tired, decreased energy 3 3   Change in appetite 3 1   Feeling bad or failure about yourself  0 0   Trouble concentrating 0 0   Moving slowly or fidgety/restless 3 0   Suicidal thoughts 0 0   PHQ-9 Score 13 10   Difficult doing work/chores Not difficult at all Not difficult at all        03/30/2023    2:38 PM 03/15/2023    3:39 PM  GAD 7 : Generalized Anxiety Score  Nervous, Anxious, on Edge 1 1  Control/stop worrying 0 0  Worry too much - different things 1 1  Trouble relaxing 0 3  Restless 0 0  Easily annoyed or irritable 0 1  Afraid - awful might happen 0 0  Total GAD 7 Score 2 6  Anxiety Difficulty Somewhat difficult Not difficult at all      Review of Systems  Constitutional:  Negative for chills and fever.  Respiratory:  Negative for shortness of breath.   Cardiovascular:  Negative for chest pain and leg swelling.  Gastrointestinal:  Negative for abdominal pain,  constipation, diarrhea, heartburn, nausea and vomiting.  Genitourinary:  Negative for dysuria, frequency and urgency.  Musculoskeletal:  Positive for joint pain.       Left shoulder pain and leg pain.  Neurological:  Negative for dizziness and headaches.  Endo/Heme/Allergies:  Negative for polydipsia.  Psychiatric/Behavioral:  Negative for depression and suicidal ideas. The patient has insomnia. The patient is not nervous/anxious.       Objective:     BP 116/74 (BP Location: Left Arm, Patient Position: Sitting, Cuff Size: Normal)   Pulse 69   Temp 98.2 F (36.8 C) (Oral)   Ht 5' 5.5 (1.664 m)   Wt 205 lb (93 kg)   SpO2 96%   BMI 33.59 kg/m  BP Readings from Last 3 Encounters:  03/30/23 116/74  03/15/23 138/82  09/24/22 110/68   Wt Readings from Last 3 Encounters:  03/30/23 205 lb (93 kg)  03/15/23 205 lb (93 kg)  09/24/22 197 lb (89.4 kg)      Physical Exam Vitals and nursing note reviewed.  Constitutional:      Appearance: Normal appearance.  Cardiovascular:     Rate and Rhythm: Normal rate and regular rhythm.     Pulses: Normal pulses.     Heart sounds: Normal heart sounds.  Pulmonary:     Effort: Pulmonary effort is normal.     Breath sounds: Normal breath sounds.  Musculoskeletal:        General: No swelling or tenderness. Normal range of motion.     Right lower leg: Normal. No swelling, deformity, lacerations, tenderness or bony tenderness. No edema.     Left lower leg: Normal. No swelling, deformity, lacerations, tenderness or bony tenderness. No edema.     Right ankle: Normal. No swelling. Normal range of motion.     Left ankle: Normal. No swelling. Normal range of motion.  Skin:    General: Skin is warm.     Capillary Refill: Capillary refill takes less than 2 seconds.  Neurological:     Mental Status: She is alert and oriented to person, place, and time.     Motor: No weakness.  Psychiatric:        Mood and Affect: Mood normal.        Behavior:  Behavior normal.        Thought Content: Thought content normal.        Judgment: Judgment normal.      No results found for any visits on 03/30/23.     The ASCVD Risk score (Arnett DK, et al., 2019) failed to calculate for the following reasons:   The valid total cholesterol range is 130 to 320 mg/dL    Assessment & Plan:  Acute pain of left shoulder Assessment & Plan: Improving.   X-ray showed osteopenia.  Referral placed for home health physical therapy.   Discussed using norco as needed (given by pain clinic). Rest and ice/heat as needed.  Orders: -     Ambulatory referral to Home Health  Bilateral leg pain Assessment & Plan: Suspect this could be related to fibromyalgia.   Discussed avoid sitting for long periods of time.  She is currently managed on Cymbalta  60 mg BID.   Recommend home health PT. Orders placed.   Orders: -     Ambulatory referral to Home Health  Hypertension, unspecified type Assessment & Plan: At goal today.   Insomnia, unspecified type Assessment & Plan: Unclear etiology.   Discussed sleep habits and to avoid daytime naps.   Given age and her cardiac history, many sleep agents contraindicate pharmacotherapy route.  Discussed sleep study.  Declines CBT at this time.  Continue magnesium  and melatonin as needed.    OSA (obstructive sleep apnea) Assessment & Plan: Non-compliant with CPAP. This could be contributing to her insomnia.  She has not called the pulmonologist.   She will call the pulmonologist and get the sleep study and discuss her COPD.       Return in about 3 months (around 06/27/2023) for physical.    Carrol Aurora, NP

## 2023-03-30 NOTE — Patient Instructions (Addendum)
 You will either be contacted via phone regarding your referral to physical therapy , or you may receive a letter on your MyChart portal from our referral team with instructions for scheduling an appointment. Please let us  know if you have not been contacted by anyone within two weeks.   Please call pulmonology and please schedule sleep study. You can try melatonin 10 mg and continue magnesium .   Continue vitamin d  and calcium .   Avoid day naps.   Schedule 3 month follow up for physical and labs.   It was a pleasure to see you today!

## 2023-04-03 DIAGNOSIS — I10 Essential (primary) hypertension: Secondary | ICD-10-CM | POA: Diagnosis not present

## 2023-04-03 DIAGNOSIS — G4700 Insomnia, unspecified: Secondary | ICD-10-CM | POA: Diagnosis not present

## 2023-04-03 DIAGNOSIS — M25552 Pain in left hip: Secondary | ICD-10-CM | POA: Diagnosis not present

## 2023-04-03 DIAGNOSIS — M79604 Pain in right leg: Secondary | ICD-10-CM | POA: Diagnosis not present

## 2023-04-03 DIAGNOSIS — M79605 Pain in left leg: Secondary | ICD-10-CM | POA: Diagnosis not present

## 2023-04-03 DIAGNOSIS — G8929 Other chronic pain: Secondary | ICD-10-CM | POA: Diagnosis not present

## 2023-04-03 DIAGNOSIS — M25551 Pain in right hip: Secondary | ICD-10-CM | POA: Diagnosis not present

## 2023-04-03 DIAGNOSIS — M8580 Other specified disorders of bone density and structure, unspecified site: Secondary | ICD-10-CM | POA: Diagnosis not present

## 2023-04-03 DIAGNOSIS — J4489 Other specified chronic obstructive pulmonary disease: Secondary | ICD-10-CM | POA: Diagnosis not present

## 2023-04-06 DIAGNOSIS — M79605 Pain in left leg: Secondary | ICD-10-CM | POA: Diagnosis not present

## 2023-04-06 DIAGNOSIS — M25552 Pain in left hip: Secondary | ICD-10-CM | POA: Diagnosis not present

## 2023-04-06 DIAGNOSIS — G4700 Insomnia, unspecified: Secondary | ICD-10-CM | POA: Diagnosis not present

## 2023-04-06 DIAGNOSIS — M25551 Pain in right hip: Secondary | ICD-10-CM | POA: Diagnosis not present

## 2023-04-06 DIAGNOSIS — G8929 Other chronic pain: Secondary | ICD-10-CM | POA: Diagnosis not present

## 2023-04-06 DIAGNOSIS — M79604 Pain in right leg: Secondary | ICD-10-CM | POA: Diagnosis not present

## 2023-04-06 DIAGNOSIS — M8580 Other specified disorders of bone density and structure, unspecified site: Secondary | ICD-10-CM | POA: Diagnosis not present

## 2023-04-06 DIAGNOSIS — J4489 Other specified chronic obstructive pulmonary disease: Secondary | ICD-10-CM | POA: Diagnosis not present

## 2023-04-06 DIAGNOSIS — I10 Essential (primary) hypertension: Secondary | ICD-10-CM | POA: Diagnosis not present

## 2023-04-12 DIAGNOSIS — M25551 Pain in right hip: Secondary | ICD-10-CM | POA: Diagnosis not present

## 2023-04-12 DIAGNOSIS — I10 Essential (primary) hypertension: Secondary | ICD-10-CM | POA: Diagnosis not present

## 2023-04-12 DIAGNOSIS — M25552 Pain in left hip: Secondary | ICD-10-CM | POA: Diagnosis not present

## 2023-04-12 DIAGNOSIS — M8580 Other specified disorders of bone density and structure, unspecified site: Secondary | ICD-10-CM | POA: Diagnosis not present

## 2023-04-12 DIAGNOSIS — J4489 Other specified chronic obstructive pulmonary disease: Secondary | ICD-10-CM | POA: Diagnosis not present

## 2023-04-12 DIAGNOSIS — M79605 Pain in left leg: Secondary | ICD-10-CM | POA: Diagnosis not present

## 2023-04-12 DIAGNOSIS — M79604 Pain in right leg: Secondary | ICD-10-CM | POA: Diagnosis not present

## 2023-04-12 DIAGNOSIS — G8929 Other chronic pain: Secondary | ICD-10-CM | POA: Diagnosis not present

## 2023-04-12 DIAGNOSIS — G4700 Insomnia, unspecified: Secondary | ICD-10-CM | POA: Diagnosis not present

## 2023-04-13 ENCOUNTER — Telehealth: Payer: Self-pay

## 2023-04-13 NOTE — Telephone Encounter (Signed)
 Verbal orders given

## 2023-04-13 NOTE — Telephone Encounter (Signed)
Copied from CRM [id number]. Topic: Clinical - Home Health Verbal Orders >> Apr 13, 2023  8:04 AM Louie Boston wrote: Caller/Agency: Stephanie/[facility] Callback Number: [phone] Service Requested: Occupational Therapy Frequency: 1x Week for 6 Weeks Any new concerns about the patient? No

## 2023-04-19 ENCOUNTER — Other Ambulatory Visit: Payer: Self-pay | Admitting: General Practice

## 2023-04-19 DIAGNOSIS — M79604 Pain in right leg: Secondary | ICD-10-CM | POA: Diagnosis not present

## 2023-04-19 DIAGNOSIS — G8929 Other chronic pain: Secondary | ICD-10-CM | POA: Diagnosis not present

## 2023-04-19 DIAGNOSIS — J4489 Other specified chronic obstructive pulmonary disease: Secondary | ICD-10-CM | POA: Diagnosis not present

## 2023-04-19 DIAGNOSIS — M25551 Pain in right hip: Secondary | ICD-10-CM | POA: Diagnosis not present

## 2023-04-19 DIAGNOSIS — M8580 Other specified disorders of bone density and structure, unspecified site: Secondary | ICD-10-CM | POA: Diagnosis not present

## 2023-04-19 DIAGNOSIS — M79605 Pain in left leg: Secondary | ICD-10-CM | POA: Diagnosis not present

## 2023-04-19 DIAGNOSIS — I10 Essential (primary) hypertension: Secondary | ICD-10-CM | POA: Diagnosis not present

## 2023-04-19 DIAGNOSIS — G4700 Insomnia, unspecified: Secondary | ICD-10-CM | POA: Diagnosis not present

## 2023-04-19 DIAGNOSIS — M25552 Pain in left hip: Secondary | ICD-10-CM | POA: Diagnosis not present

## 2023-04-19 NOTE — Telephone Encounter (Unsigned)
 Copied from CRM [id number]. Topic: Clinical - Medication Refill >> Apr 19, 2023 11:51 AM Isabell A wrote: Most Recent Primary Care Visit:  Provider: Modesto Charon  Department: [facility]  Visit Type: OFFICE VISIT  Date: 03/30/2023  Medication: meloxicam (MOBIC) 15 MG tablet    Has the patient contacted their pharmacy? Yes (Agent: If no, request that the patient contact the pharmacy for the refill. If patient does not wish to contact the pharmacy document the reason why and proceed with request.) (Agent: If yes, when and what did the pharmacy advise?)  Is this the correct pharmacy for this prescription? Yes If no, delete pharmacy and type the correct one.  This is the patient's preferred pharmacy:  [phone], [place] - [address] [place] [address] Phone: [phone] Fax: [phone]   Has the prescription been filled recently? Yes  Is the patient out of the medication? No  Has the patient been seen for an appointment in the last year OR does the patient have an upcoming appointment? Yes  Can we respond through MyChart? No  Agent: Please be advised that Rx refills may take up to 3 business days. We ask that you follow-up with your pharmacy.

## 2023-04-19 NOTE — Telephone Encounter (Signed)
 Last Fill: Unknown  Last OV: 03/30/23 Next OV: 06/28/23  Routing to provider for review/authorization.

## 2023-04-19 NOTE — Telephone Encounter (Signed)
 In as historical; okay to refill?

## 2023-04-20 NOTE — Telephone Encounter (Signed)
 Called patient and her last PCP was the prescriber

## 2023-04-21 MED ORDER — MELOXICAM 15 MG PO TABS: 15.0000 mg | ORAL_TABLET | Freq: Every day | ORAL | 1 refills | Status: DC

## 2023-04-30 ENCOUNTER — Telehealth: Payer: Self-pay | Admitting: Cardiology

## 2023-04-30 ENCOUNTER — Other Ambulatory Visit: Payer: Self-pay | Admitting: Physician Assistant

## 2023-04-30 DIAGNOSIS — M79605 Pain in left leg: Secondary | ICD-10-CM | POA: Diagnosis not present

## 2023-04-30 DIAGNOSIS — M25551 Pain in right hip: Secondary | ICD-10-CM | POA: Diagnosis not present

## 2023-04-30 DIAGNOSIS — G4700 Insomnia, unspecified: Secondary | ICD-10-CM | POA: Diagnosis not present

## 2023-04-30 DIAGNOSIS — J4489 Other specified chronic obstructive pulmonary disease: Secondary | ICD-10-CM | POA: Diagnosis not present

## 2023-04-30 DIAGNOSIS — M25552 Pain in left hip: Secondary | ICD-10-CM | POA: Diagnosis not present

## 2023-04-30 DIAGNOSIS — M79604 Pain in right leg: Secondary | ICD-10-CM | POA: Diagnosis not present

## 2023-04-30 DIAGNOSIS — G8929 Other chronic pain: Secondary | ICD-10-CM | POA: Diagnosis not present

## 2023-04-30 DIAGNOSIS — I10 Essential (primary) hypertension: Secondary | ICD-10-CM | POA: Diagnosis not present

## 2023-04-30 DIAGNOSIS — M8580 Other specified disorders of bone density and structure, unspecified site: Secondary | ICD-10-CM | POA: Diagnosis not present

## 2023-04-30 MED ORDER — AMIODARONE HCL 100 MG PO TABS: 100.0000 mg | ORAL_TABLET | Freq: Every day | ORAL | 1 refills | Status: DC

## 2023-04-30 NOTE — Telephone Encounter (Signed)
*  STAT* If patient is at the pharmacy, call can be transferred to refill team.   1. Which medications need to be refilled? (please list name of each medication and dose if known)   amiodarone (PACERONE) 100 MG tablet   DIFFERENT PHARMACY   4. Which pharmacy/location (including street and city if local pharmacy) is medication to be sent to? [id number], [place] - [address] Phone: [phone]  Fax: [phone]       5. Do they need a 30 day or 90 day supply? 90    Pt daughter states she is completely out

## 2023-04-30 NOTE — Telephone Encounter (Signed)
T's medication was sent to pt's pharmacy as requested. Confirmation received.

## 2023-05-01 DIAGNOSIS — I10 Essential (primary) hypertension: Secondary | ICD-10-CM | POA: Diagnosis not present

## 2023-05-01 DIAGNOSIS — M25552 Pain in left hip: Secondary | ICD-10-CM | POA: Diagnosis not present

## 2023-05-01 DIAGNOSIS — M8580 Other specified disorders of bone density and structure, unspecified site: Secondary | ICD-10-CM | POA: Diagnosis not present

## 2023-05-01 DIAGNOSIS — M25551 Pain in right hip: Secondary | ICD-10-CM | POA: Diagnosis not present

## 2023-05-01 DIAGNOSIS — M79604 Pain in right leg: Secondary | ICD-10-CM | POA: Diagnosis not present

## 2023-05-01 DIAGNOSIS — G4700 Insomnia, unspecified: Secondary | ICD-10-CM | POA: Diagnosis not present

## 2023-05-01 DIAGNOSIS — G8929 Other chronic pain: Secondary | ICD-10-CM | POA: Diagnosis not present

## 2023-05-01 DIAGNOSIS — J4489 Other specified chronic obstructive pulmonary disease: Secondary | ICD-10-CM | POA: Diagnosis not present

## 2023-05-01 DIAGNOSIS — M79605 Pain in left leg: Secondary | ICD-10-CM | POA: Diagnosis not present

## 2023-05-04 ENCOUNTER — Ambulatory Visit: Admitting: Family Medicine

## 2023-05-04 ENCOUNTER — Encounter: Payer: Self-pay | Admitting: Family Medicine

## 2023-05-04 VITALS — BP 110/60 | HR 57 | Temp 97.9°F | Ht 65.5 in | Wt 203.0 lb

## 2023-05-04 DIAGNOSIS — R051 Acute cough: Secondary | ICD-10-CM | POA: Diagnosis not present

## 2023-05-04 DIAGNOSIS — R350 Frequency of micturition: Secondary | ICD-10-CM | POA: Diagnosis not present

## 2023-05-04 LAB — POC URINALSYSI DIPSTICK (AUTOMATED)
Bilirubin, UA: NEGATIVE
Blood, UA: NEGATIVE
Glucose, UA: NEGATIVE
Ketones, UA: NEGATIVE
Nitrite, UA: NEGATIVE
Protein, UA: POSITIVE — AB
Spec Grav, UA: 1.015 (ref 1.010–1.025)
Urobilinogen, UA: 0.2 U/dL
pH, UA: 6 (ref 5.0–8.0)

## 2023-05-04 LAB — POCT UA - MICROSCOPIC ONLY

## 2023-05-04 LAB — POC INFLUENZA A&B (BINAX/QUICKVUE)
Influenza A, POC: NEGATIVE
Influenza B, POC: NEGATIVE

## 2023-05-04 LAB — POC COVID19 BINAXNOW: SARS Coronavirus 2 Ag: NEGATIVE

## 2023-05-04 MED ORDER — AMOXICILLIN-POT CLAVULANATE 875-125 MG PO TABS: 1.0000 | ORAL_TABLET | Freq: Two times a day (BID) | ORAL | 0 refills | Status: DC

## 2023-05-04 NOTE — Assessment & Plan Note (Signed)
 Acute, urinalysis is contaminated but given she has a history of frequent UTI and urosepsis I will send urine for culture.  I will also choose an antibiotic that covers urinary as well as respiratory symptoms.

## 2023-05-04 NOTE — Progress Notes (Signed)
 Patient ID: Kelsey Diaz, female    DOB: December 23, 1945, 78 y.o.   MRN: [id number]  This visit was conducted in person.  BP 110/60 (BP Location: Right Arm, Patient Position: Sitting, Cuff Size: Large)   Pulse (!) 57   Temp 97.9 F (36.6 C) (Temporal)   Ht 5' 5.5" (1.664 m)   Wt 203 lb (92.1 kg)   SpO2 95%   BMI 33.27 kg/m    CC:  Chief Complaint  Patient presents with   Fatigue    X 3 days   Shortness of Breath        Cough   Wheezing    Subjective:   HPI: Kelsey Diaz is a 78 y.o. female presenting on 05/04/2023 for Fatigue (X 3 days), Shortness of Breath (/), Cough, and Wheezing   Date of onset: 3 days Initial symptoms included   ST Symptoms progressed to coughing, some post tussive emesis, np ear pain, no face pain. Rattling in chest  No fever  Having SOB and wheeze... worse at night   She has history of frequent UTi.  Has lichen sclerosis.Marland Kitchen always has ithcing and burning. Some increase in frequncy   Sick contacts:  none COVID testing:   none     She has tried to treat with  ibuprofen, albuterol several times a day   Has held stioloto for past few days     Has history of chronic lung disease such as asthma and  COPD.  Former smoker.      Relevant past medical, surgical, family and social history reviewed and updated as indicated. Interim medical history since our last visit reviewed. Allergies and medications reviewed and updated. Outpatient Medications Prior to Visit  Medication Sig Dispense Refill   albuterol (ACCUNEB) 0.63 MG/3ML nebulizer solution Take 1 ampule by nebulization every 6 (six) hours as needed for wheezing or shortness of breath.     albuterol (VENTOLIN HFA) 108 (90 Base) MCG/ACT inhaler Inhale 2 puffs into the lungs as needed for shortness of breath.     amiodarone (PACERONE) 100 MG tablet Take 1 tablet (100 mg total) by mouth daily. 90 tablet 1   aspirin 81 MG chewable tablet Chew 81 mg by mouth daily.     Calcium  Carbonate-Vitamin D 600-200 MG-UNIT TABS Take 1 tablet by mouth daily.      Carboxymethylcellulose Sodium (EYE DROPS OP) Place 2 drops into both eyes as needed (clear eyes).     Clobetasol Prop Emollient Base 0.05 % emollient cream Apply 1 application  topically 2 (two) times daily as needed (skin irritation).     CRANBERRY PO Take 1 tablet by mouth daily.     DULoxetine (CYMBALTA) 60 MG capsule Take 60 mg by mouth 2 (two) times daily.     etanercept (ENBREL) 50 MG/ML injection Inject 50 mg into the skin every Wednesday.     fluticasone (FLONASE) 50 MCG/ACT nasal spray Place 2 sprays into both nostrils at bedtime.     HYDROcodone-acetaminophen (NORCO) 7.5-325 MG tablet Take 1 tablet by mouth every 6 (six) hours as needed for moderate pain (pain score 4-6) or severe pain (pain score 7-10). 90 tablet 0   ibuprofen (ADVIL) 200 MG tablet Take 400 mg by mouth as needed for headache or moderate pain.     magnesium oxide (MAG-OX) 400 (240 Mg) MG tablet Take 1 tablet (400 mg total) by mouth daily. 30 tablet 1   meloxicam (MOBIC) 15 MG tablet Take 1 tablet (15  mg total) by mouth daily. 30 tablet 1   metoprolol succinate (TOPROL XL) 25 MG 24 hr tablet Take 1 tablet (25 mg total) by mouth in the morning and at bedtime. (Patient taking differently: Take 25 mg by mouth daily.) 180 tablet 3   Multiple Vitamin (MULTI-VITAMIN PO) Take 1 tablet by mouth daily.      Omega-3 Fatty Acids (FISH OIL PO) Take 2 capsules by mouth daily.     omeprazole (PRILOSEC) 40 MG capsule Take 40 mg by mouth daily.  1   rosuvastatin (CRESTOR) 20 MG tablet Take 20 mg by mouth every Monday.     Tiotropium Bromide-Olodaterol (STIOLTO RESPIMAT) 2.5-2.5 MCG/ACT AERS Inhale 1 puff into the lungs in the morning and at bedtime. 4 g 0   No facility-administered medications prior to visit.     Per HPI unless specifically indicated in ROS section below Review of Systems  Constitutional:  Negative for fatigue and fever.  HENT:  Positive for  congestion.   Eyes:  Negative for pain.  Respiratory:  Positive for cough, shortness of breath and wheezing.   Cardiovascular:  Negative for chest pain, palpitations and leg swelling.  Gastrointestinal:  Negative for abdominal pain.  Genitourinary:  Negative for dysuria and vaginal bleeding.  Musculoskeletal:  Negative for back pain.  Neurological:  Negative for syncope, light-headedness and headaches.  Psychiatric/Behavioral:  Negative for dysphoric mood.    Objective:  BP 110/60 (BP Location: Right Arm, Patient Position: Sitting, Cuff Size: Large)   Pulse (!) 57   Temp 97.9 F (36.6 C) (Temporal)   Ht 5' 5.5" (1.664 m)   Wt 203 lb (92.1 kg)   SpO2 95%   BMI 33.27 kg/m   Wt Readings from Last 3 Encounters:  05/04/23 203 lb (92.1 kg)  03/30/23 205 lb (93 kg)  03/15/23 205 lb (93 kg)      Physical Exam Constitutional:      General: She is not in acute distress.    Appearance: She is well-developed. She is not ill-appearing or toxic-appearing.  HENT:     Head: Normocephalic.     Right Ear: Hearing, tympanic membrane, ear canal and external ear normal. Tympanic membrane is not erythematous, retracted or bulging.     Left Ear: Hearing, tympanic membrane, ear canal and external ear normal. Tympanic membrane is not erythematous, retracted or bulging.     Nose: Mucosal edema and rhinorrhea present.     Right Sinus: No maxillary sinus tenderness or frontal sinus tenderness.     Left Sinus: No maxillary sinus tenderness or frontal sinus tenderness.     Mouth/Throat:     Pharynx: Uvula midline.  Eyes:     General: Lids are normal. Lids are everted, no foreign bodies appreciated.     Conjunctiva/sclera: Conjunctivae normal.     Pupils: Pupils are equal, round, and reactive to light.  Neck:     Thyroid: No thyroid mass or thyromegaly.     Vascular: No carotid bruit.     Trachea: Trachea normal.  Cardiovascular:     Rate and Rhythm: Normal rate and regular rhythm.     Pulses:  Normal pulses.     Heart sounds: Normal heart sounds, S1 normal and S2 normal. No murmur heard.    No friction rub. No gallop.  Pulmonary:     Effort: Pulmonary effort is normal. No tachypnea or respiratory distress.     Breath sounds: Normal breath sounds. No decreased breath sounds, wheezing, rhonchi or rales.  Musculoskeletal:     Cervical back: Normal range of motion and neck supple.  Skin:    General: Skin is warm and dry.     Findings: No rash.  Neurological:     Mental Status: She is alert.  Psychiatric:        Mood and Affect: Mood is not anxious or depressed.        Speech: Speech normal.        Behavior: Behavior normal. Behavior is cooperative.        Judgment: Judgment normal.       Results for orders placed or performed in visit on 05/04/23  POC COVID-19   Collection Time: 05/04/23  4:23 PM  Result Value Ref Range   SARS Coronavirus 2 Ag Negative Negative  POC Influenza A&B (Binax test)   Collection Time: 05/04/23  4:24 PM  Result Value Ref Range   Influenza A, POC Negative Negative   Influenza B, POC Negative Negative  POCT Urinalysis Dipstick (Automated)   Collection Time: 05/04/23  4:24 PM  Result Value Ref Range   Color, UA DK Yellow    Clarity, UA Clear    Glucose, UA Negative Negative   Bilirubin, UA Negative    Ketones, UA Negative    Spec Grav, UA 1.015 1.010 - 1.025   Blood, UA Negative    pH, UA 6.0 5.0 - 8.0   Protein, UA Positive (A) Negative   Urobilinogen, UA 0.2 0.2 or 1.0 E.U./dL   Nitrite, UA Negative    Leukocytes, UA Moderate (2+) (A) Negative  POCT UA - Microscopic Only   Collection Time: 05/04/23  5:40 PM  Result Value Ref Range   WBC, Ur, HPF, POC tntc 0 - 5   RBC, Urine, Miroscopic     Bacteria, U Microscopic     Mucus, UA     Epithelial cells, urine per micros TNTC    Crystals, Ur, HPF, POC     Casts, Ur, LPF, POC     Yeast, UA      Assessment and Plan  Urine frequency Assessment & Plan: Acute, urinalysis is  contaminated but given she has a history of frequent UTI and urosepsis I will send urine for culture.  I will also choose an antibiotic that covers urinary as well as respiratory symptoms.  Orders: -     POCT Urinalysis Dipstick (Automated) -     Urine Culture -     POCT UA - Microscopic Only  Acute cough Assessment & Plan: Acute, negative COVID and flu in office. Given COPD/asthma and change in color of mucus I will cover for possible bacterial superinfection.  She does have some evidence of wheeze and exacerbation of COPD so I will also treat with her prednisone taper.  Return and ER precautions provided if severe shortness of breath go to the emergency room.  Orders: -     POC COVID-19 BinaxNow -     POC Influenza A&B(BINAX/QUICKVUE)  Other orders -     Amoxicillin-Pot Clavulanate; Take 1 tablet by mouth 2 (two) times daily.  Dispense: 14 tablet; Refill: 0    No follow-ups on file.   Kerby Nora, MD

## 2023-05-04 NOTE — Assessment & Plan Note (Signed)
 Acute, negative COVID and flu in office. Given COPD/asthma and change in color of mucus I will cover for possible bacterial superinfection.  She does have some evidence of wheeze and exacerbation of COPD so I will also treat with her prednisone taper.  Return and ER precautions provided if severe shortness of breath go to the emergency room.

## 2023-05-05 ENCOUNTER — Telehealth: Payer: Self-pay

## 2023-05-05 DIAGNOSIS — M8580 Other specified disorders of bone density and structure, unspecified site: Secondary | ICD-10-CM | POA: Diagnosis not present

## 2023-05-05 DIAGNOSIS — M25551 Pain in right hip: Secondary | ICD-10-CM | POA: Diagnosis not present

## 2023-05-05 DIAGNOSIS — M79604 Pain in right leg: Secondary | ICD-10-CM | POA: Diagnosis not present

## 2023-05-05 DIAGNOSIS — M25552 Pain in left hip: Secondary | ICD-10-CM | POA: Diagnosis not present

## 2023-05-05 DIAGNOSIS — G8929 Other chronic pain: Secondary | ICD-10-CM | POA: Diagnosis not present

## 2023-05-05 DIAGNOSIS — M79605 Pain in left leg: Secondary | ICD-10-CM | POA: Diagnosis not present

## 2023-05-05 DIAGNOSIS — I10 Essential (primary) hypertension: Secondary | ICD-10-CM | POA: Diagnosis not present

## 2023-05-05 DIAGNOSIS — G4700 Insomnia, unspecified: Secondary | ICD-10-CM | POA: Diagnosis not present

## 2023-05-05 DIAGNOSIS — J4489 Other specified chronic obstructive pulmonary disease: Secondary | ICD-10-CM | POA: Diagnosis not present

## 2023-05-05 LAB — URINE CULTURE
MICRO NUMBER:: 16186472
SPECIMEN QUALITY:: ADEQUATE

## 2023-05-05 MED ORDER — PREDNISONE 20 MG PO TABS: ORAL_TABLET | ORAL | 0 refills | Status: DC

## 2023-05-05 NOTE — Telephone Encounter (Signed)
 Copied from CRM [id number]. Topic: Clinical - Prescription Issue >> May 04, 2023  5:52 PM [place] J wrote: Reason for CRM: Patient daughter calling in regarding a medication Dr. Ermalene Searing was going to call in but never was sent to pharmacy. Daughter said it was to help with patient's wheezing.

## 2023-05-05 NOTE — Telephone Encounter (Signed)
 Patient's daughter notified by telephone that Dr. Ermalene Searing sent in [facility] [place]

## 2023-05-05 NOTE — Telephone Encounter (Signed)
 Please let patient/daughter know that I have sent in the prednisone to [facility] [place].  I am not sure why I missed that  one when I sent in the antibiotic.  Please let her know I apologize.

## 2023-05-05 NOTE — Addendum Note (Signed)
 Addended by: Kerby Nora E on: 05/05/2023 10:31 AM   Modules accepted: Orders

## 2023-05-06 ENCOUNTER — Encounter: Payer: Self-pay | Admitting: Family Medicine

## 2023-05-11 ENCOUNTER — Encounter: Payer: Medicare HMO | Admitting: Anesthesiology

## 2023-05-11 DIAGNOSIS — M79604 Pain in right leg: Secondary | ICD-10-CM | POA: Diagnosis not present

## 2023-05-11 DIAGNOSIS — G4700 Insomnia, unspecified: Secondary | ICD-10-CM | POA: Diagnosis not present

## 2023-05-11 DIAGNOSIS — G8929 Other chronic pain: Secondary | ICD-10-CM | POA: Diagnosis not present

## 2023-05-11 DIAGNOSIS — I10 Essential (primary) hypertension: Secondary | ICD-10-CM | POA: Diagnosis not present

## 2023-05-11 DIAGNOSIS — M25551 Pain in right hip: Secondary | ICD-10-CM | POA: Diagnosis not present

## 2023-05-11 DIAGNOSIS — J4489 Other specified chronic obstructive pulmonary disease: Secondary | ICD-10-CM | POA: Diagnosis not present

## 2023-05-11 DIAGNOSIS — M8580 Other specified disorders of bone density and structure, unspecified site: Secondary | ICD-10-CM | POA: Diagnosis not present

## 2023-05-11 DIAGNOSIS — M25552 Pain in left hip: Secondary | ICD-10-CM | POA: Diagnosis not present

## 2023-05-11 DIAGNOSIS — M79605 Pain in left leg: Secondary | ICD-10-CM | POA: Diagnosis not present

## 2023-05-12 DIAGNOSIS — M25569 Pain in unspecified knee: Secondary | ICD-10-CM | POA: Diagnosis not present

## 2023-05-12 DIAGNOSIS — M542 Cervicalgia: Secondary | ICD-10-CM | POA: Diagnosis not present

## 2023-05-12 DIAGNOSIS — J449 Chronic obstructive pulmonary disease, unspecified: Secondary | ICD-10-CM | POA: Diagnosis not present

## 2023-05-12 DIAGNOSIS — M1990 Unspecified osteoarthritis, unspecified site: Secondary | ICD-10-CM | POA: Diagnosis not present

## 2023-05-12 DIAGNOSIS — I10 Essential (primary) hypertension: Secondary | ICD-10-CM | POA: Diagnosis not present

## 2023-05-12 DIAGNOSIS — M069 Rheumatoid arthritis, unspecified: Secondary | ICD-10-CM | POA: Diagnosis not present

## 2023-05-12 DIAGNOSIS — R5383 Other fatigue: Secondary | ICD-10-CM | POA: Diagnosis not present

## 2023-05-12 DIAGNOSIS — Z79899 Other long term (current) drug therapy: Secondary | ICD-10-CM | POA: Diagnosis not present

## 2023-05-18 ENCOUNTER — Encounter: Payer: Self-pay | Admitting: Anesthesiology

## 2023-05-18 ENCOUNTER — Ambulatory Visit: Attending: Anesthesiology | Admitting: Anesthesiology

## 2023-05-18 DIAGNOSIS — M25551 Pain in right hip: Secondary | ICD-10-CM | POA: Diagnosis not present

## 2023-05-18 DIAGNOSIS — M5431 Sciatica, right side: Secondary | ICD-10-CM | POA: Diagnosis not present

## 2023-05-18 DIAGNOSIS — M4716 Other spondylosis with myelopathy, lumbar region: Secondary | ICD-10-CM | POA: Diagnosis not present

## 2023-05-18 DIAGNOSIS — M797 Fibromyalgia: Secondary | ICD-10-CM | POA: Diagnosis not present

## 2023-05-18 DIAGNOSIS — M5432 Sciatica, left side: Secondary | ICD-10-CM

## 2023-05-18 DIAGNOSIS — M25552 Pain in left hip: Secondary | ICD-10-CM | POA: Diagnosis not present

## 2023-05-18 DIAGNOSIS — M0579 Rheumatoid arthritis with rheumatoid factor of multiple sites without organ or systems involvement: Secondary | ICD-10-CM | POA: Diagnosis not present

## 2023-05-18 DIAGNOSIS — M79641 Pain in right hand: Secondary | ICD-10-CM

## 2023-05-18 DIAGNOSIS — M79642 Pain in left hand: Secondary | ICD-10-CM

## 2023-05-18 DIAGNOSIS — Z79891 Long term (current) use of opiate analgesic: Secondary | ICD-10-CM

## 2023-05-18 DIAGNOSIS — F1190 Opioid use, unspecified, uncomplicated: Secondary | ICD-10-CM

## 2023-05-18 DIAGNOSIS — G894 Chronic pain syndrome: Secondary | ICD-10-CM

## 2023-05-18 DIAGNOSIS — M47817 Spondylosis without myelopathy or radiculopathy, lumbosacral region: Secondary | ICD-10-CM

## 2023-05-18 MED ORDER — HYDROCODONE-ACETAMINOPHEN 7.5-325 MG PO TABS: 1.0000 | ORAL_TABLET | Freq: Four times a day (QID) | ORAL | 0 refills | Status: DC | PRN

## 2023-05-18 NOTE — Patient Instructions (Signed)

## 2023-05-18 NOTE — Progress Notes (Signed)
 Virtual Visit via Telephone Note  I connected with Kelsey Diaz on 05/18/23 at  1:00 PM EDT by telephone and verified that I am speaking with the correct person using two identifiers.  Location: Patient: Home Provider: [facility] control center   I discussed the limitations, risks, security and privacy concerns of performing an evaluation and management service by telephone and the availability of in person appointments. I also discussed with the patient that there may be a patient responsible charge related to this service. The patient expressed understanding and agreed to proceed.   History of Present Illness: I spoke with Kelsey Diaz via telephone as we were unable to link for the video portion conference.  She reports that her low back pain has been slightly worse with some increase in the sciatica symptoms.  She needs to hold off on an epidural though her last epidural was back in February of last year which was quite effective, because she is having some problems with stable V. tach.  She has been placed on amiodarone for control of this and is doing better.  However the pain has been slightly more persistent.  She does take her hydrocodone 3 times a day at the 7.5 mg strength and this works to keep her pain under control and also reduces her sciatica symptoms.  It enables her to stay functional active and sleep better.  Otherwise she is in her usual state of health with no new changes in lower extremity strength or function or bowel or bladder function.  Review of systems: General: No fevers or chills Pulmonary: No shortness of breath or dyspnea Cardiac: No angina or palpitations or lightheadedness GI: No abdominal pain or constipation Psych: No depression    Observations/Objective:  Current Outpatient Medications:    albuterol (ACCUNEB) 0.63 MG/3ML nebulizer solution, Take 1 ampule by nebulization every 6 (six) hours as needed for wheezing or shortness of breath., Disp: , Rfl:     albuterol (VENTOLIN HFA) 108 (90 Base) MCG/ACT inhaler, Inhale 2 puffs into the lungs as needed for shortness of breath., Disp: , Rfl:    amiodarone (PACERONE) 100 MG tablet, Take 1 tablet (100 mg total) by mouth daily., Disp: 90 tablet, Rfl: 1   amoxicillin-clavulanate (AUGMENTIN) 875-125 MG tablet, Take 1 tablet by mouth 2 (two) times daily., Disp: 14 tablet, Rfl: 0   aspirin 81 MG chewable tablet, Chew 81 mg by mouth daily., Disp: , Rfl:    Calcium Carbonate-Vitamin D 600-200 MG-UNIT TABS, Take 1 tablet by mouth daily. , Disp: , Rfl:    Carboxymethylcellulose Sodium (EYE DROPS OP), Place 2 drops into both eyes as needed (clear eyes)., Disp: , Rfl:    Clobetasol Prop Emollient Base 0.05 % emollient cream, Apply 1 application  topically 2 (two) times daily as needed (skin irritation)., Disp: , Rfl:    CRANBERRY PO, Take 1 tablet by mouth daily., Disp: , Rfl:    DULoxetine (CYMBALTA) 60 MG capsule, Take 60 mg by mouth 2 (two) times daily., Disp: , Rfl:    etanercept (ENBREL) 50 MG/ML injection, Inject 50 mg into the skin every Wednesday., Disp: , Rfl:    fluticasone (FLONASE) 50 MCG/ACT nasal spray, Place 2 sprays into both nostrils at bedtime., Disp: , Rfl:    [START ON 05/24/2023] HYDROcodone-acetaminophen (NORCO) 7.5-325 MG tablet, Take 1 tablet by mouth every 6 (six) hours as needed for moderate pain (pain score 4-6) or severe pain (pain score 7-10)., Disp: 90 tablet, Rfl: 0   ibuprofen (ADVIL)  200 MG tablet, Take 400 mg by mouth as needed for headache or moderate pain., Disp: , Rfl:    magnesium oxide (MAG-OX) 400 (240 Mg) MG tablet, Take 1 tablet (400 mg total) by mouth daily., Disp: 30 tablet, Rfl: 1   meloxicam (MOBIC) 15 MG tablet, Take 1 tablet (15 mg total) by mouth daily., Disp: 30 tablet, Rfl: 1   metoprolol succinate (TOPROL XL) 25 MG 24 hr tablet, Take 1 tablet (25 mg total) by mouth in the morning and at bedtime. (Patient taking differently: Take 25 mg by mouth daily.), Disp: 180  tablet, Rfl: 3   Multiple Vitamin (MULTI-VITAMIN PO), Take 1 tablet by mouth daily. , Disp: , Rfl:    Omega-3 Fatty Acids (FISH OIL PO), Take 2 capsules by mouth daily., Disp: , Rfl:    omeprazole (PRILOSEC) 40 MG capsule, Take 40 mg by mouth daily., Disp: , Rfl: 1   predniSONE (DELTASONE) 20 MG tablet, 3 tabs by mouth daily x 3 days, then 2 tabs by mouth daily x 2 days then 1 tab by mouth daily x 2 days, Disp: 15 tablet, Rfl: 0   rosuvastatin (CRESTOR) 20 MG tablet, Take 20 mg by mouth every Monday., Disp: , Rfl:    Tiotropium Bromide-Olodaterol (STIOLTO RESPIMAT) 2.5-2.5 MCG/ACT AERS, Inhale 1 puff into the lungs in the morning and at bedtime., Disp: 4 g, Rfl: 0   Past Medical History:  Diagnosis Date   Abnormal nuclear stress test 06/03/2017   Allergic rhinitis    Anxiety    Asthma    Bacteremia 04/05/2021   Bilateral hip pain 11/28/2018   Chest pain 06/03/2017   Colitis    COPD (chronic obstructive pulmonary disease) (HCC)    Dizzy 06/13/2012   E-coli UTI 04/04/2021   Educated about COVID-19 virus infection 07/02/2019   Fibromyalgia 05/16/2019   GERD (gastroesophageal reflux disease)    With esophageal strictures   Hand pain 11/28/2018   Hypertension    Hyperthyroidism    Hypomagnesemia 05/03/2022   Normocytic anemia 05/03/2022   OSA (obstructive sleep apnea) 02/09/2012   No mask.  Did not tolerate   Osteoarthritis    PNA (pneumonia) 09/13/2017   Rheumatoid arthritis (HCC) 10/03/2018   Sepsis due to Escherichia coli (E. coli) (HCC) 04/05/2021   SOB (shortness of breath) 07/02/2019   Thrombocytopenia (HCC) 05/03/2022   Assessment and Plan:  1. Lumbar spondylosis with myelopathy   2. Bilateral hip pain   3. Chronic, continuous use of opioids   4. Rheumatoid arthritis involving multiple sites with positive rheumatoid factor (HCC)   5. Chronic pain syndrome   6. Fibromyalgia   7. Right sided sciatica   8. Pain in both hands   9. Facet arthritis of lumbosacral region    10. Bilateral sciatica    Based on our conversation after review of the [facility] practitioner database information I think it is appropriate to refill her medicines for the next month.  Most schedule this for March 31.  I will have her return to clinic in 1 month for reevaluation.  She is also due for routine urinalysis.  No other changes will be initiated.  I encouraged her to continue follow-up with cardiology and continue with the amiodarone.  Once she is cleared by cardiology for possible epidural we will look to do that.  Will defer on that at this point.  Continue current stretching strengthening exercises and increase activity as tolerated with the warmer weather.  Continue follow-up with her primary  care physicians for baseline medical care. Follow Up Instructions:    I discussed the assessment and treatment plan with the patient. The patient was provided an opportunity to ask questions and all were answered. The patient agreed with the plan and demonstrated an understanding of the instructions.   The patient was advised to call back or seek an in-person evaluation if the symptoms worsen or if the condition fails to improve as anticipated.  I provided 30 minutes of non-face-to-face time during this encounter.   Yevette Edwards, MD

## 2023-05-19 DIAGNOSIS — G4700 Insomnia, unspecified: Secondary | ICD-10-CM | POA: Diagnosis not present

## 2023-05-19 DIAGNOSIS — G8929 Other chronic pain: Secondary | ICD-10-CM | POA: Diagnosis not present

## 2023-05-19 DIAGNOSIS — M8580 Other specified disorders of bone density and structure, unspecified site: Secondary | ICD-10-CM | POA: Diagnosis not present

## 2023-05-19 DIAGNOSIS — M25552 Pain in left hip: Secondary | ICD-10-CM | POA: Diagnosis not present

## 2023-05-19 DIAGNOSIS — M79604 Pain in right leg: Secondary | ICD-10-CM | POA: Diagnosis not present

## 2023-05-19 DIAGNOSIS — M79605 Pain in left leg: Secondary | ICD-10-CM | POA: Diagnosis not present

## 2023-05-19 DIAGNOSIS — I10 Essential (primary) hypertension: Secondary | ICD-10-CM | POA: Diagnosis not present

## 2023-05-19 DIAGNOSIS — M25551 Pain in right hip: Secondary | ICD-10-CM | POA: Diagnosis not present

## 2023-05-19 DIAGNOSIS — J4489 Other specified chronic obstructive pulmonary disease: Secondary | ICD-10-CM | POA: Diagnosis not present

## 2023-05-25 DIAGNOSIS — M25551 Pain in right hip: Secondary | ICD-10-CM | POA: Diagnosis not present

## 2023-05-25 DIAGNOSIS — I10 Essential (primary) hypertension: Secondary | ICD-10-CM | POA: Diagnosis not present

## 2023-05-25 DIAGNOSIS — M25552 Pain in left hip: Secondary | ICD-10-CM | POA: Diagnosis not present

## 2023-05-25 DIAGNOSIS — G4700 Insomnia, unspecified: Secondary | ICD-10-CM | POA: Diagnosis not present

## 2023-05-25 DIAGNOSIS — J4489 Other specified chronic obstructive pulmonary disease: Secondary | ICD-10-CM | POA: Diagnosis not present

## 2023-05-25 DIAGNOSIS — M79604 Pain in right leg: Secondary | ICD-10-CM | POA: Diagnosis not present

## 2023-05-25 DIAGNOSIS — M8580 Other specified disorders of bone density and structure, unspecified site: Secondary | ICD-10-CM | POA: Diagnosis not present

## 2023-05-25 DIAGNOSIS — M79605 Pain in left leg: Secondary | ICD-10-CM | POA: Diagnosis not present

## 2023-05-25 DIAGNOSIS — G8929 Other chronic pain: Secondary | ICD-10-CM | POA: Diagnosis not present

## 2023-05-28 DIAGNOSIS — M8580 Other specified disorders of bone density and structure, unspecified site: Secondary | ICD-10-CM | POA: Diagnosis not present

## 2023-05-28 DIAGNOSIS — G8929 Other chronic pain: Secondary | ICD-10-CM | POA: Diagnosis not present

## 2023-05-28 DIAGNOSIS — M79605 Pain in left leg: Secondary | ICD-10-CM | POA: Diagnosis not present

## 2023-05-28 DIAGNOSIS — M25551 Pain in right hip: Secondary | ICD-10-CM | POA: Diagnosis not present

## 2023-05-28 DIAGNOSIS — I10 Essential (primary) hypertension: Secondary | ICD-10-CM | POA: Diagnosis not present

## 2023-05-28 DIAGNOSIS — G4700 Insomnia, unspecified: Secondary | ICD-10-CM | POA: Diagnosis not present

## 2023-05-28 DIAGNOSIS — M25552 Pain in left hip: Secondary | ICD-10-CM | POA: Diagnosis not present

## 2023-05-28 DIAGNOSIS — M79604 Pain in right leg: Secondary | ICD-10-CM | POA: Diagnosis not present

## 2023-05-28 DIAGNOSIS — J4489 Other specified chronic obstructive pulmonary disease: Secondary | ICD-10-CM | POA: Diagnosis not present

## 2023-06-05 ENCOUNTER — Other Ambulatory Visit: Payer: Self-pay

## 2023-06-05 ENCOUNTER — Ambulatory Visit
Admission: RE | Admit: 2023-06-05 | Discharge: 2023-06-05 | Disposition: A | Discharge status: Home / Self Care | Payer: Self-pay | Source: Ambulatory Visit | Attending: Emergency Medicine | Admitting: Emergency Medicine

## 2023-06-05 VITALS — BP 135/70 | HR 51 | Temp 99.0°F | Resp 18

## 2023-06-05 DIAGNOSIS — R350 Frequency of micturition: Secondary | ICD-10-CM | POA: Diagnosis not present

## 2023-06-05 DIAGNOSIS — J441 Chronic obstructive pulmonary disease with (acute) exacerbation: Secondary | ICD-10-CM | POA: Diagnosis not present

## 2023-06-05 MED ORDER — PREDNISONE 10 MG (21) PO TBPK: ORAL_TABLET | Freq: Every day | ORAL | 0 refills | Status: DC

## 2023-06-05 MED ORDER — BENZONATATE 100 MG PO CAPS: 100.0000 mg | ORAL_CAPSULE | Freq: Three times a day (TID) | ORAL | 0 refills | Status: DC

## 2023-06-05 MED ORDER — DOXYCYCLINE HYCLATE 100 MG PO CAPS: 100.0000 mg | ORAL_CAPSULE | Freq: Two times a day (BID) | ORAL | 0 refills | Status: DC

## 2023-06-05 MED ORDER — METHYLPREDNISOLONE ACETATE 80 MG/ML IJ SUSP
60.0000 mg | Freq: Once | INTRAMUSCULAR | Status: AC
  Administered 2023-06-05: 60 mg via INTRAMUSCULAR

## 2023-06-05 NOTE — ED Provider Notes (Signed)
 Arlander Bellman    CSN: [id number] Arrival date & time: 06/05/23  1400      History   Chief Complaint Chief Complaint  Patient presents with   Diarrhea   Cough   Dysuria    HPI Kelsey Diaz is a 78 y.o. female.   Patient presents for evaluation of a worsening productive cough for 2 weeks, worsening shortness of breath at rest and exertion for 1 month and wheezing over the past 2 days.  Has had several occurrences of soft stools over the past 2 days, endorses typically constipated.  Believed to be triggered by the pollen, denies sick contacts.  Denies fever, ear pain, sore throat.  Concerned with urinary frequency and dysuria present for 4 days.  Has not attempted treatment.  Denies hematuria, abdominal pain, flank pain, fever, vaginal symptoms.   Past Medical History:  Diagnosis Date   Abnormal nuclear stress test 06/03/2017   Allergic rhinitis    Anxiety    Asthma    Bacteremia 04/05/2021   Bilateral hip pain 11/28/2018   Chest pain 06/03/2017   Colitis    COPD (chronic obstructive pulmonary disease) (HCC)    Dizzy 06/13/2012   E-coli UTI 04/04/2021   Educated about COVID-19 virus infection 07/02/2019   Fibromyalgia 05/16/2019   GERD (gastroesophageal reflux disease)    With esophageal strictures   Hand pain 11/28/2018   Hypertension    Hyperthyroidism    Hypomagnesemia 05/03/2022   Normocytic anemia 05/03/2022   OSA (obstructive sleep apnea) 02/09/2012   No mask.  Did not tolerate   Osteoarthritis    PNA (pneumonia) 09/13/2017   Rheumatoid arthritis (HCC) 10/03/2018   Sepsis due to Escherichia coli (E. 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    Past Surgical History:  Procedure Laterality Date   BREAST BIOPSY     x2   CATARACT EXTRACTION W/PHACO Left 09/27/2018   Procedure: CATARACT EXTRACTION PHACO AND INTRAOCULAR LENS PLACEMENT (IOC) LEFT;  Surgeon: Clair Crews, MD;  Location: [facility];  Service: Ophthalmology;  Laterality: Left;   CATARACT EXTRACTION W/PHACO Right 11/29/2018   Procedure: CATARACT EXTRACTION PHACO AND INTRAOCULAR LENS PLACEMENT (IOC) RIGHT;  Surgeon: Clair Crews, MD;  Location: [facility];  Service: Ophthalmology;  Laterality: Right;  1:22 20.1% 16.64   CHOLECYSTECTOMY     2005   KNEE SURGERY Right 2013   two torn ligaments repaired.    LEFT HEART CATH AND CORONARY ANGIOGRAPHY N/A 06/08/2017   Procedure: LEFT HEART CATH AND CORONARY ANGIOGRAPHY;  Surgeon: Millicent Ally, MD;  Location: [facility];  Service: Cardiovascular;  Laterality: N/A;   LEFT HEART CATH AND CORONARY ANGIOGRAPHY N/A 12/12/2020   Procedure: LEFT HEART CATH AND CORONARY ANGIOGRAPHY;  Surgeon: Avanell Leigh, MD;  Location: [facility];  Service: Cardiovascular;  Laterality: N/A;   NASAL SINUS SURGERY     V TACH ABLATION N/A  12/16/2020   Procedure: V TACH ABLATION;  Surgeon: Boyce Byes, MD;  Location: [facility];  Service: Cardiovascular;  Laterality: N/A;   VAGINAL HYSTERECTOMY      OB History   No obstetric history on file.      Home Medications    Prior to Admission medications   Medication Sig Start Date End Date Taking? Authorizing Provider  albuterol (ACCUNEB) 0.63 MG/3ML nebulizer solution Take 1 ampule by nebulization every 6 (six) hours as needed for wheezing or shortness of breath. 06/24/16    [provider]  albuterol (VENTOLIN HFA) 108 (90 Base) MCG/ACT inhaler Inhale 2 puffs into the lungs as needed for shortness of breath.    [provider]  amiodarone (PACERONE) 100 MG tablet Take 1 tablet (100 mg total) by mouth daily. 04/30/23   Boyce Byes, MD  amoxicillin-clavulanate (AUGMENTIN) 875-125 MG tablet Take 1 tablet by mouth 2 (two) times daily. 05/04/23   Judithann Novas, MD  aspirin 81 MG chewable tablet Chew 81 mg by mouth daily.    [provider]  Calcium Carbonate-Vitamin D 600-200 MG-UNIT TABS Take 1 tablet by mouth daily.     [provider]  Carboxymethylcellulose Sodium (EYE DROPS OP) Place 2 drops into both eyes as needed (clear eyes).    [provider]  Clobetasol Prop Emollient Base 0.05 % emollient cream Apply 1 application  topically 2 (two) times daily as needed (skin irritation). 01/26/19   [provider]  CRANBERRY PO Take 1 tablet by mouth daily.    [provider]  DULoxetine (CYMBALTA) 60 MG capsule Take 60 mg by mouth 2 (two) times daily.    [provider]  etanercept (ENBREL) 50 MG/ML injection Inject 50 mg into the skin every Wednesday.    [provider]  fluticasone (FLONASE) 50 MCG/ACT nasal spray Place 2 sprays into both nostrils at bedtime. 05/18/18   [provider]  HYDROcodone-acetaminophen (NORCO) 7.5-325 MG tablet Take 1 tablet by mouth every 6 (six) hours as needed for moderate pain (pain score 4-6) or severe pain (pain score 7-10). 05/24/23 06/23/23  Zula Hitch, MD  ibuprofen (ADVIL) 200 MG tablet Take 400 mg by mouth as needed for headache or moderate pain.    [provider]  magnesium oxide (MAG-OX) 400 (240 Mg) MG tablet Take 1 tablet (400 mg total) by mouth daily. 05/05/22   Haydee Lipa, MD  meloxicam (MOBIC) 15 MG tablet Take 1 tablet (15 mg total) by mouth daily. 04/21/23   Jolanda Nation, NP  metoprolol succinate (TOPROL XL) 25 MG  24 hr tablet Take 1 tablet (25 mg total) by mouth in the morning and at bedtime. Patient taking differently: Take 25 mg by mouth daily. 09/24/22   Boyce Byes, MD  Multiple Vitamin (MULTI-VITAMIN PO) Take 1 tablet by mouth daily.     [provider]  Omega-3 Fatty Acids (FISH OIL PO) Take 2 capsules by mouth daily.    [provider]  omeprazole (PRILOSEC) 40 MG capsule Take 40 mg by mouth daily. 04/08/17   [provider]  predniSONE (DELTASONE) 20 MG tablet 3 tabs by mouth daily x 3 days, then 2 tabs by mouth daily x 2 days then 1 tab by mouth daily x 2 days 05/05/23   Judithann Novas, MD  rosuvastatin (CRESTOR) 20 MG tablet Take 20 mg by mouth every Monday. 01/02/20   [provider]  Tiotropium Bromide-Olodaterol (STIOLTO RESPIMAT)  2.5-2.5 MCG/ACT AERS Inhale 1 puff into the lungs in the morning and at bedtime. 07/29/22   Mannam, Praveen, MD    Family History Family History  Problem Relation Age of Onset   Hypertension Mother    Diabetes Mother    Allergies Mother    Heart attack Father 51   Cancer Father        kidney   Emphysema Father    Heart disease Father        CHF age 54   Heart attack Brother 17       Died of MI age 77    Social History Social History   Tobacco Use   Smoking status: Former    Current packs/day: 0.00    Average packs/day: 2.0 packs/day for 40.0 years (80.0 ttl pk-yrs)    Types: Cigarettes    Start date: 02/24/1959    Quit date: 02/24/1999    Years since quitting: 24.2   Smokeless tobacco: Never  Vaping Use   Vaping status: Never Used  Substance Use Topics   Alcohol use: Not Currently    Comment:     Drug use: No     Allergies   Morphine, Advair diskus [fluticasone-salmeterol], Ambien [zolpidem tartrate], Amlodipine besylate, Azithromycin, Bextra [valdecoxib], Bupropion, Captopril, Irbesartan, Latex, Lisinopril, Pacerone [amiodarone], Pulmicort [budesonide], Savella [milnacipran hcl], Spiriva [tiotropium  bromide monohydrate], Suvorexant, and Tiotropium bromide monohydrate   Review of Systems Review of Systems   Physical Exam Triage Vital Signs ED Triage Vitals [06/05/23 1414]  Encounter Vitals Group     BP 135/70     Systolic BP Percentile      Diastolic BP Percentile      Pulse Rate (!) 51     Resp 18     Temp 99 F (37.2 C)     Temp Source Oral     SpO2 95 %     Weight      Height      Head Circumference      Peak Flow      Pain Score      Pain Loc      Pain Education      Exclude from Growth Chart    No data found.  Updated Vital Signs BP 135/70 (BP Location: Left Arm)   Pulse (!) 51   Temp 99 F (37.2 C) (Oral)   Resp 18   SpO2 95%   Visual Acuity Right Eye Distance:   Left Eye Distance:   Bilateral Distance:    Right Eye Near:   Left Eye Near:    Bilateral Near:     Physical Exam Constitutional:      Appearance: Normal appearance.  HENT:     Head: Normocephalic.     Right Ear: Tympanic membrane, ear canal and external ear normal.     Left Ear: Tympanic membrane, ear canal and external ear normal.     Nose: Nose normal.     Mouth/Throat:     Mouth: Mucous membranes are moist.     Pharynx: Oropharynx is clear.  Eyes:     Extraocular Movements: Extraocular movements intact.  Cardiovascular:     Rate and Rhythm: Normal rate and regular rhythm.     Pulses: Normal pulses.     Heart sounds: Normal heart sounds.  Pulmonary:     Effort: Pulmonary effort is normal.     Breath sounds: Normal breath sounds.  Abdominal:     Tenderness: There is abdominal tenderness in the  suprapubic area. There is no right CVA tenderness or left CVA tenderness.  Neurological:     Mental Status: She is alert and oriented to person, place, and time. Mental status is at baseline.      UC Treatments / Results  Labs (all labs ordered are listed, but only abnormal results are displayed) Labs Reviewed  POCT URINALYSIS DIP (MANUAL ENTRY)    EKG   Radiology No  results found.  Procedures Procedures (including critical care time)  Medications Ordered in UC Medications - No data to display  Initial Impression / Assessment and Plan / UC Course  I have reviewed the triage vital signs and the nursing notes.  Pertinent labs & imaging results that were available during my care of the patient were reviewed by me and considered in my medical decision making (see chart for details).  COPD exacerbation, urinary frequency  Vital signs are stable, O2 saturation 95% on room air, lungs clear to auscultation, denies URI symptoms therefore viral testing deferred and no signs of distress nontoxic-appearing, stable for outpatient management, will move forward with treatment with prednisone and Tessalon, methylprednisolone injection given prior to discharge, recommended additional supportive care with follow-up with pulmonology as needed  Unable to provide urine sample in clinic, patient to bring sample back for testing   Final Clinical Impressions(s) / UC Diagnoses   Final diagnoses:  COPD exacerbation [facility])   Discharge Instructions   None    ED Prescriptions   None    PDMP not reviewed this encounter.   Reena Canning, [place] 06/06/23 [phone]

## 2023-06-05 NOTE — ED Triage Notes (Signed)
 Patient presents to [facility] for evaluation of 1 week of burning at the end of urination with hx of UTIs and urosepsis.  Patient also c/o 1 week of diarrhea, with 1 large episode of diarrhea in the past 24 hours.  Patient also c/o cough, hx of pneumonia after aspiraton, and concerned for pneumonia again.  Patient unsure of whether she has had a fever or not

## 2023-06-05 NOTE — Discharge Instructions (Addendum)
 Do believe your symptoms are related to seasonal weather change which is flared your COPD, low suspicion for viral or bacterial cause as you do not have symptoms such as fever, ear pain, sore throat etc.  Return urine to clinic tomorrow and we will make adjustments or additional medication as needed  Begin doxycycline twice daily for 7 days for bacterial coverage that you recently used Augmentin last month  You have been given an injection of steroids to help relax the airway which ideally will calm your breathing, starting tomorrow take oral prednisone as directed    You can take Tylenol and/or Ibuprofen as needed for fever reduction and pain relief.   For cough: honey 1/2 to 1 teaspoon (you can dilute the honey in water or another fluid).  You can also use guaifenesin and dextromethorphan for cough. You can use a humidifier for chest congestion and cough.  If you don't have a humidifier, you can sit in the bathroom with the hot shower running.      For sore throat: try warm salt water gargles, cepacol lozenges, throat spray, warm tea or water with lemon/honey, popsicles or ice, or OTC cold relief medicine for throat discomfort.   For congestion: take a daily anti-histamine like Zyrtec, Claritin, and a oral decongestant, such as pseudoephedrine.  You can also use Flonase 1-2 sprays in each nostril daily.   It is important to stay hydrated: drink plenty of fluids (water, gatorade/powerade/pedialyte, juices, or teas) to keep your throat moisturized and help further relieve irritation/discomfort.

## 2023-06-05 NOTE — ED Notes (Signed)
 Patient provided urine pack and will try to return for urine sample in the am

## 2023-06-06 ENCOUNTER — Telehealth (INDEPENDENT_AMBULATORY_CARE_PROVIDER_SITE_OTHER): Admitting: Emergency Medicine

## 2023-06-06 DIAGNOSIS — R350 Frequency of micturition: Secondary | ICD-10-CM

## 2023-06-06 LAB — POCT URINALYSIS DIPSTICK
Bilirubin, UA: NEGATIVE
Blood, UA: NEGATIVE
Glucose, UA: NEGATIVE
Ketones, UA: NEGATIVE
Nitrite, UA: NEGATIVE
Protein, UA: NEGATIVE
Spec Grav, UA: 1.02 (ref 1.010–1.025)
Urobilinogen, UA: 1 U/dL
pH, UA: 6.5 (ref 5.0–8.0)

## 2023-06-06 NOTE — Telephone Encounter (Signed)
 Return to clinic with urine sample

## 2023-06-07 LAB — URINE CULTURE: Culture: <10000 — AB

## 2023-06-14 ENCOUNTER — Encounter: Admitting: Anesthesiology

## 2023-06-17 ENCOUNTER — Other Ambulatory Visit: Payer: Self-pay | Admitting: General Practice

## 2023-06-17 ENCOUNTER — Ambulatory Visit: Attending: Anesthesiology | Admitting: Anesthesiology

## 2023-06-17 ENCOUNTER — Encounter: Payer: Self-pay | Admitting: Anesthesiology

## 2023-06-17 VITALS — BP 153/94 | HR 52 | Temp 96.8°F | Resp 18 | Ht 67.5 in | Wt 200.0 lb

## 2023-06-17 DIAGNOSIS — M797 Fibromyalgia: Secondary | ICD-10-CM | POA: Diagnosis not present

## 2023-06-17 DIAGNOSIS — G894 Chronic pain syndrome: Secondary | ICD-10-CM | POA: Diagnosis not present

## 2023-06-17 DIAGNOSIS — M25552 Pain in left hip: Secondary | ICD-10-CM | POA: Diagnosis not present

## 2023-06-17 DIAGNOSIS — M4716 Other spondylosis with myelopathy, lumbar region: Secondary | ICD-10-CM | POA: Diagnosis not present

## 2023-06-17 DIAGNOSIS — M0579 Rheumatoid arthritis with rheumatoid factor of multiple sites without organ or systems involvement: Secondary | ICD-10-CM | POA: Insufficient documentation

## 2023-06-17 DIAGNOSIS — M25551 Pain in right hip: Secondary | ICD-10-CM | POA: Insufficient documentation

## 2023-06-17 DIAGNOSIS — F1190 Opioid use, unspecified, uncomplicated: Secondary | ICD-10-CM | POA: Insufficient documentation

## 2023-06-17 MED ORDER — HYDROCODONE-ACETAMINOPHEN 7.5-325 MG PO TABS: 1.0000 | ORAL_TABLET | Freq: Four times a day (QID) | ORAL | 0 refills | Status: DC | PRN

## 2023-06-17 NOTE — Progress Notes (Signed)
 Subjective:  Patient ID: Kelsey Diaz, female    DOB: 1945/12/04  Age: 79 y.o. MRN: [id number]  CC: Back Pain (L>R)   Procedure: None  HPI Kelsey Diaz presents for reevaluation.  She continues to have significant pain affecting the low back bilateral hips and posterior lateral legs.  The pain has been persistent despite efforts with stretching strengthening exercises.  She has been having concomitant medical problems that may have aggravated the low back pain as well but she has been limited regarding exercise capacity secondary to the colder weather.  Otherwise she is in her usual state of health with no new contributory changes today.  She takes her hydrocodone  7.5 mg tablets 3 times a day and generally gets good relief but it is insufficient to give her pain relief at night.  In the past she has been on 4 times daily dosing and it was more effective she reports.  She tried to reduce to 3 times a day dosing but feels that this has been less optimal.  No side effects with the medication reported.  She has failed more conservative therapy where physical therapy has been insufficient in addition to anti-inflammatories to keep her pain under control.  No new changes in the quality characteristic or distribution of her pain are noted.  Outpatient Medications Prior to Visit  Medication Sig Dispense Refill   albuterol  (ACCUNEB ) 0.63 MG/3ML nebulizer solution Take 1 ampule by nebulization every 6 (six) hours as needed for wheezing or shortness of breath.     albuterol  (VENTOLIN  HFA) 108 (90 Base) MCG/ACT inhaler Inhale 2 puffs into the lungs as needed for shortness of breath.     amiodarone  (PACERONE ) 100 MG tablet Take 1 tablet (100 mg total) by mouth daily. 90 tablet 1   amoxicillin -clavulanate (AUGMENTIN ) 875-125 MG tablet Take 1 tablet by mouth 2 (two) times daily. 14 tablet 0   aspirin  81 MG chewable tablet Chew 81 mg by mouth daily.     benzonatate  (TESSALON ) 100 MG capsule Take 1  capsule (100 mg total) by mouth every 8 (eight) hours. 21 capsule 0   Calcium  Carbonate-Vitamin D  600-200 MG-UNIT TABS Take 1 tablet by mouth daily.      Carboxymethylcellulose Sodium (EYE DROPS OP) Place 2 drops into both eyes as needed (clear eyes).     Clobetasol  Prop Emollient Base 0.05 % emollient cream Apply 1 application  topically 2 (two) times daily as needed (skin irritation).     CRANBERRY PO Take 1 tablet by mouth daily.     doxycycline  (VIBRAMYCIN ) 100 MG capsule Take 1 capsule (100 mg total) by mouth 2 (two) times daily. 14 capsule 0   DULoxetine  (CYMBALTA ) 60 MG capsule Take 60 mg by mouth 2 (two) times daily.     etanercept (ENBREL) 50 MG/ML injection Inject 50 mg into the skin every Wednesday.     fluticasone  (FLONASE ) 50 MCG/ACT nasal spray Place 2 sprays into both nostrils at bedtime.     ibuprofen (ADVIL) 200 MG tablet Take 400 mg by mouth as needed for headache or moderate pain.     magnesium  oxide (MAG-OX) 400 (240 Mg) MG tablet Take 1 tablet (400 mg total) by mouth daily. 30 tablet 1   meloxicam  (MOBIC ) 15 MG tablet TAKE 1 TABLET (15 MG TOTAL) BY MOUTH DAILY. 30 tablet 1   metoprolol  succinate (TOPROL  XL) 25 MG 24 hr tablet Take 1 tablet (25 mg total) by mouth in the morning and at bedtime. (Patient taking  differently: Take 25 mg by mouth daily.) 180 tablet 3   Multiple Vitamin (MULTI-VITAMIN PO) Take 1 tablet by mouth daily.      Omega-3 Fatty Acids (FISH OIL PO) Take 2 capsules by mouth daily.     omeprazole (PRILOSEC) 40 MG capsule Take 40 mg by mouth daily.  1   predniSONE  (STERAPRED UNI-PAK 21 TAB) 10 MG (21) TBPK tablet Take by mouth daily. Take 6 tabs by mouth daily  for 1 days, then 5 tabs for 1 days, then 4 tabs for 1 days, then 3 tabs for 1 days, 2 tabs for 1 days, then 1 tab by mouth daily for 1 days 21 tablet 0   rosuvastatin  (CRESTOR ) 20 MG tablet Take 20 mg by mouth every Monday.     Tiotropium Bromide-Olodaterol (STIOLTO RESPIMAT ) 2.5-2.5 MCG/ACT AERS Inhale 1  puff into the lungs in the morning and at bedtime. 4 g 0   HYDROcodone -acetaminophen  (NORCO) 7.5-325 MG tablet Take 1 tablet by mouth every 6 (six) hours as needed for moderate pain (pain score 4-6) or severe pain (pain score 7-10). 90 tablet 0   No facility-administered medications prior to visit.    Review of Systems CNS: No confusion or sedation Cardiac: No angina or palpitations GI: No abdominal pain or constipation Constitutional: No nausea vomiting fevers or chills  Objective:  BP (!) 153/94   Pulse (!) 52   Temp (!) 96.8 F (36 C) (Temporal)   Resp 18   Ht 5' 7.5" (1.715 m)   Wt 200 lb (90.7 kg)   SpO2 97%   BMI 30.86 kg/m    BP Readings from Last 3 Encounters:  06/17/23 (!) 153/94  06/05/23 135/70  05/04/23 110/60     Wt Readings from Last 3 Encounters:  06/17/23 200 lb (90.7 kg)  05/04/23 203 lb (92.1 kg)  03/30/23 205 lb (93 kg)     Physical Exam Pt is alert and oriented PERRL EOMI HEART IS RRR no murmur or rub LCTA no wheezing or rales MUSCULOSKELETAL reveals some paraspinous muscle tenderness in the lumbar region but no overt trigger points.  She does have pain on extension with left and right lateral rotation aggravating her primary pain complaint.  Muscle tone and bulk to the lower extremities is intact.  Labs  Lab Results  Component Value Date   HGBA1C 6.1 (H) 12/11/2020   Lab Results  Component Value Date   LDLCALC 39 12/11/2020   CREATININE 0.98 09/24/2022    -------------------------------------------------------------------------------------------------------------------- Lab Results  Component Value Date   WBC 5.1 05/14/2022   HGB 12.6 05/14/2022   HCT 37.3 05/14/2022   PLT 170 05/14/2022   GLUCOSE 123 (H) 09/24/2022   CHOL 113 12/11/2020   TRIG 127 12/11/2020   HDL 49 12/11/2020   LDLCALC 39 12/11/2020   ALT 18 09/24/2022   AST 22 09/24/2022   NA 136 09/24/2022   K 4.5 09/24/2022   CL 100 09/24/2022   CREATININE 0.98  09/24/2022   BUN 24 09/24/2022   CO2 22 09/24/2022   TSH 5.070 (H) 09/24/2022   INR 1.0 04/04/2021   HGBA1C 6.1 (H) 12/11/2020    --------------------------------------------------------------------------------------------------------------------- No results found.   Assessment & Plan:   Daysie was seen today for back pain.  Diagnoses and all orders for this visit:  Lumbar spondylosis with myelopathy  Bilateral hip pain  Chronic, continuous use of opioids -     ToxASSURE Select 13 (MW), Urine  Rheumatoid arthritis involving multiple sites with positive  rheumatoid factor (HCC)  Chronic pain syndrome -     ToxASSURE Select 13 (MW), Urine  Fibromyalgia  Other orders -     HYDROcodone -acetaminophen  (NORCO) 7.5-325 MG tablet; Take 1 tablet by mouth every 6 (six) hours as needed for moderate pain (pain score 4-6) or severe pain (pain score 7-10). -     HYDROcodone -acetaminophen  (NORCO) 7.5-325 MG tablet; Take 1 tablet by mouth every 6 (six) hours as needed for moderate pain (pain score 4-6) or severe pain (pain score 7-10).        ----------------------------------------------------------------------------------------------------------------------  Problem List Items Addressed This Visit       Unprioritized   Fibromyalgia   Relevant Medications   HYDROcodone -acetaminophen  (NORCO) 7.5-325 MG tablet (Start on 06/21/2023)   HYDROcodone -acetaminophen  (NORCO) 7.5-325 MG tablet (Start on 07/21/2023)   Rheumatoid arthritis (HCC)   Relevant Medications   HYDROcodone -acetaminophen  (NORCO) 7.5-325 MG tablet (Start on 06/21/2023)   HYDROcodone -acetaminophen  (NORCO) 7.5-325 MG tablet (Start on 07/21/2023)   Other Visit Diagnoses       Lumbar spondylosis with myelopathy    -  Primary     Bilateral hip pain         Chronic, continuous use of opioids       Relevant Orders   ToxASSURE Select 13 (MW), Urine     Chronic pain syndrome       Relevant Medications    HYDROcodone -acetaminophen  (NORCO) 7.5-325 MG tablet (Start on 06/21/2023)   HYDROcodone -acetaminophen  (NORCO) 7.5-325 MG tablet (Start on 07/21/2023)   Other Relevant Orders   ToxASSURE Select 13 (MW), Urine         ----------------------------------------------------------------------------------------------------------------------  1. Lumbar spondylosis with myelopathy (Primary) I have encouraged her to continue with stretching strengthening exercises and routine physical therapy.  Utilization of TENS unit as tolerated.  2. Bilateral hip pain As above with continuation of core stretching strengthening exercises.  3. Chronic, continuous use of opioids I have reviewed the [place]  practitioner database information is appropriate for refill.  I am going to switch her back to the 4 times daily dosing as she felt like she did better with this.  She denies any diverting or illicit use.  No problems or side effects with the medication are noted except for routine breakthrough pain. - ToxASSURE Select 13 (MW), Urine  4. Rheumatoid arthritis involving multiple sites with positive rheumatoid factor (HCC) As above and continue follow-up with her primary care physicians.  5. Chronic pain syndrome  - ToxASSURE Select 13 (MW), Urine  6. Fibromyalgia Continue daily physical therapy with stretching exercises and current medication management.    ----------------------------------------------------------------------------------------------------------------------  I am having Leia Pun. Wieman start on HYDROcodone -acetaminophen . I am also having her maintain her Calcium  Carbonate-Vitamin D , Multiple Vitamin (MULTI-VITAMIN PO), albuterol , Omega-3 Fatty Acids (FISH OIL PO), omeprazole, Clobetasol  Prop Emollient Base, Enbrel, rosuvastatin , aspirin , DULoxetine , fluticasone , CRANBERRY PO, Carboxymethylcellulose Sodium (EYE DROPS OP), ibuprofen, magnesium  oxide, albuterol , Stiolto Respimat ,  metoprolol  succinate, amiodarone , amoxicillin -clavulanate, benzonatate , doxycycline , predniSONE , meloxicam , and HYDROcodone -acetaminophen .   Meds ordered this encounter  Medications   HYDROcodone -acetaminophen  (NORCO) 7.5-325 MG tablet    Sig: Take 1 tablet by mouth every 6 (six) hours as needed for moderate pain (pain score 4-6) or severe pain (pain score 7-10).    Dispense:  120 tablet    Refill:  0   HYDROcodone -acetaminophen  (NORCO) 7.5-325 MG tablet    Sig: Take 1 tablet by mouth every 6 (six) hours as needed for moderate pain (pain score 4-6) or severe pain (pain score 7-10).  Dispense:  120 tablet    Refill:  0   Patient's Medications  New Prescriptions   HYDROCODONE -ACETAMINOPHEN  (NORCO) 7.5-325 MG TABLET    Take 1 tablet by mouth every 6 (six) hours as needed for moderate pain (pain score 4-6) or severe pain (pain score 7-10).  Previous Medications   ALBUTEROL  (ACCUNEB ) 0.63 MG/3ML NEBULIZER SOLUTION    Take 1 ampule by nebulization every 6 (six) hours as needed for wheezing or shortness of breath.   ALBUTEROL  (VENTOLIN  HFA) 108 (90 BASE) MCG/ACT INHALER    Inhale 2 puffs into the lungs as needed for shortness of breath.   AMIODARONE  (PACERONE ) 100 MG TABLET    Take 1 tablet (100 mg total) by mouth daily.   AMOXICILLIN -CLAVULANATE (AUGMENTIN ) 875-125 MG TABLET    Take 1 tablet by mouth 2 (two) times daily.   ASPIRIN  81 MG CHEWABLE TABLET    Chew 81 mg by mouth daily.   BENZONATATE  (TESSALON ) 100 MG CAPSULE    Take 1 capsule (100 mg total) by mouth every 8 (eight) hours.   CALCIUM  CARBONATE-VITAMIN D  600-200 MG-UNIT TABS    Take 1 tablet by mouth daily.    CARBOXYMETHYLCELLULOSE SODIUM (EYE DROPS OP)    Place 2 drops into both eyes as needed (clear eyes).   CLOBETASOL  PROP EMOLLIENT BASE 0.05 % EMOLLIENT CREAM    Apply 1 application  topically 2 (two) times daily as needed (skin irritation).   CRANBERRY PO    Take 1 tablet by mouth daily.   DOXYCYCLINE  (VIBRAMYCIN ) 100 MG  CAPSULE    Take 1 capsule (100 mg total) by mouth 2 (two) times daily.   DULOXETINE  (CYMBALTA ) 60 MG CAPSULE    Take 60 mg by mouth 2 (two) times daily.   ETANERCEPT (ENBREL) 50 MG/ML INJECTION    Inject 50 mg into the skin every Wednesday.   FLUTICASONE  (FLONASE ) 50 MCG/ACT NASAL SPRAY    Place 2 sprays into both nostrils at bedtime.   IBUPROFEN (ADVIL) 200 MG TABLET    Take 400 mg by mouth as needed for headache or moderate pain.   MAGNESIUM  OXIDE (MAG-OX) 400 (240 MG) MG TABLET    Take 1 tablet (400 mg total) by mouth daily.   MELOXICAM  (MOBIC ) 15 MG TABLET    TAKE 1 TABLET (15 MG TOTAL) BY MOUTH DAILY.   METOPROLOL  SUCCINATE (TOPROL  XL) 25 MG 24 HR TABLET    Take 1 tablet (25 mg total) by mouth in the morning and at bedtime.   MULTIPLE VITAMIN (MULTI-VITAMIN PO)    Take 1 tablet by mouth daily.    OMEGA-3 FATTY ACIDS (FISH OIL PO)    Take 2 capsules by mouth daily.   OMEPRAZOLE (PRILOSEC) 40 MG CAPSULE    Take 40 mg by mouth daily.   PREDNISONE  (STERAPRED UNI-PAK 21 TAB) 10 MG (21) TBPK TABLET    Take by mouth daily. Take 6 tabs by mouth daily  for 1 days, then 5 tabs for 1 days, then 4 tabs for 1 days, then 3 tabs for 1 days, 2 tabs for 1 days, then 1 tab by mouth daily for 1 days   ROSUVASTATIN  (CRESTOR ) 20 MG TABLET    Take 20 mg by mouth every Monday.   TIOTROPIUM BROMIDE-OLODATEROL (STIOLTO RESPIMAT ) 2.5-2.5 MCG/ACT AERS    Inhale 1 puff into the lungs in the morning and at bedtime.  Modified Medications   Modified Medication Previous Medication   HYDROCODONE -ACETAMINOPHEN  (NORCO) 7.5-325 MG TABLET HYDROcodone -acetaminophen  (NORCO) 7.5-325 MG  tablet      Take 1 tablet by mouth every 6 (six) hours as needed for moderate pain (pain score 4-6) or severe pain (pain score 7-10).    Take 1 tablet by mouth every 6 (six) hours as needed for moderate pain (pain score 4-6) or severe pain (pain score 7-10).  Discontinued Medications   No medications on file    ----------------------------------------------------------------------------------------------------------------------  Follow-up: Return in about 2 months (around 08/17/2023) for evaluation, med refill.    Zula Hitch, MD

## 2023-06-17 NOTE — Patient Instructions (Signed)
Medication Rules  Applies to: All patients receiving prescriptions (written or electronic).  Pharmacy of record: Pharmacy where electronic prescriptions will be sent. If written prescriptions are taken to a different pharmacy, please inform the nursing staff. The pharmacy listed in the electronic medical record should be the one where you would like electronic prescriptions to be sent.  Prescription refills: Only during scheduled appointments. Applies to both, written and electronic prescriptions.  NOTE: The following applies primarily to controlled substances (Opioid* Pain Medications).   Patient's responsibilities: Pain Pills: Bring all pain pills to every appointment (except for procedure appointments). Pill Bottles: Bring pills in original pharmacy bottle. Always bring newest bottle. Bring bottle, even if empty. Medication refills: You are responsible for knowing and keeping track of what medications you need refilled. The day before your appointment, write a list of all prescriptions that need to be refilled. Bring that list to your appointment and give it to the admitting nurse. Prescriptions will be written only during appointments. If you forget a medication, it will not be "Called in", "Faxed", or "electronically sent". You will need to get another appointment to get these prescribed. Prescription Accuracy: You are responsible for carefully inspecting your prescriptions before leaving our office. Have the discharge nurse carefully go over each prescription with you, before taking them home. Make sure that your name is accurately spelled, that your address is correct. Check the name and dose of your medication to make sure it is accurate. Check the number of pills, and the written instructions to make sure they are clear and accurate. Make sure that you are given enough medication to last until your next medication refill appointment. Taking Medication: Take medication as prescribed. Never  take more pills than instructed. Never take medication more frequently than prescribed. Taking less pills or less frequently is permitted and encouraged, when it comes to controlled substances (written prescriptions).  Inform other Doctors: Always inform, all of your healthcare providers, of all the medications you take. Pain Medication from other Providers: You are not allowed to accept any additional pain medication from any other Doctor or Healthcare provider. There are two exceptions to this rule. (see below) In the event that you require additional pain medication, you are responsible for notifying us, as stated below. Medication Agreement: You are responsible for carefully reading and following our Medication Agreement. This must be signed before receiving any prescriptions from our practice. Safely store a copy of your signed Agreement. Violations to the Agreement will result in no further prescriptions. (Additional copies of our Medication Agreement are available upon request.) Laws, Rules, & Regulations: All patients are expected to follow all Federal and State Laws, Statutes, Rules, & Regulations. Ignorance of the Laws does not constitute a valid excuse. The use of any illegal substances is prohibited. Adopted CDC guidelines & recommendations: Target dosing levels will be at or below 60 MME/day. Use of benzodiazepines** is not recommended.  Exceptions: There are only two exceptions to the rule of not receiving pain medications from other Healthcare Providers. Exception #1 (Emergencies): In the event of an emergency (i.e.: accident requiring emergency care), you are allowed to receive additional pain medication. However, you are responsible for: As soon as you are able, call our office [phone], at any time of the day or night, and leave a message stating your name, the date and nature of the emergency, and the name and dose of the medication prescribed. In the event that your call is answered  by a member of   our staff, make sure to document and save the date, time, and the name of the person that took your information.  Exception #2 (Planned Surgery): In the event that you are scheduled by another doctor or dentist to have any type of surgery or procedure, you are allowed (for a period no longer than 30 days), to receive additional pain medication, for the acute post-op pain. However, in this case, you are responsible for picking up a copy of our "Post-op Pain Management for Surgeons" handout, and giving it to your surgeon or dentist. This document is available at our office, and does not require an appointment to obtain it. Simply go to our office during business hours (Monday-Thursday from 8:00 AM to 4:00 PM) (Friday 8:00 AM to 12:00 Noon) or if you have a scheduled appointment with us, prior to your surgery, and ask for it by name. In addition, you will need to provide us with your name, name of your surgeon, type of surgery, and date of procedure or surgery.  *Opioid medications include: morphine, codeine, oxycodone, oxymorphone, hydrocodone, hydromorphone, meperidine, tramadol, tapentadol, buprenorphine, fentanyl, methadone. **Benzodiazepine medications include: diazepam (Valium), alprazolam (Xanax), clonazepam (Klonopine), lorazepam (Ativan), clorazepate (Tranxene), chlordiazepoxide (Librium), estazolam (Prosom), oxazepam (Serax), temazepam (Restoril), triazolam (Halcion) (Last updated: 04/22/2017)  

## 2023-06-17 NOTE — Progress Notes (Signed)
 Nursing Pain Medication Assessment:  Safety precautions to be maintained throughout the outpatient stay will include: orient to surroundings, keep bed in low position, maintain call bell within reach at all times, provide assistance with transfer out of bed and ambulation.   Medication Inspection Compliance: Pill count conducted under aseptic conditions, in front of the patient. Neither the pills nor the bottle was removed from the patient's sight at any time. Once count was completed pills were immediately returned to the patient in their original bottle.  Medication: Hydrocodone /APAP Pill/Patch Count:  25 of 90 pills remain Pill/Patch Appearance: Markings consistent with prescribed medication Bottle Appearance: Standard pharmacy container. Clearly labeled. Filled Date: 04 / 02 / 2025 Last Medication intake:  Today

## 2023-06-18 ENCOUNTER — Telehealth: Payer: Self-pay | Admitting: Anesthesiology

## 2023-06-18 NOTE — Telephone Encounter (Signed)
 Patient's daughter Brian Campanile is asking if she can do blood work instead of UDS ? Patient is having problems doing the UDS.

## 2023-06-21 NOTE — Telephone Encounter (Signed)
 I will forward this message to Dr. Welton Hall.

## 2023-06-22 DIAGNOSIS — F1190 Opioid use, unspecified, uncomplicated: Secondary | ICD-10-CM | POA: Diagnosis not present

## 2023-06-22 DIAGNOSIS — G894 Chronic pain syndrome: Secondary | ICD-10-CM | POA: Diagnosis not present

## 2023-06-26 LAB — TOXASSURE SELECT 13 (MW), URINE

## 2023-06-28 ENCOUNTER — Encounter: Payer: Medicare HMO | Admitting: General Practice

## 2023-07-01 ENCOUNTER — Encounter: Payer: Self-pay | Admitting: Pulmonary Disease

## 2023-07-01 ENCOUNTER — Ambulatory Visit: Admitting: Pulmonary Disease

## 2023-07-01 VITALS — BP 136/74 | HR 63 | Ht 66.5 in | Wt 204.0 lb

## 2023-07-01 DIAGNOSIS — G4733 Obstructive sleep apnea (adult) (pediatric): Secondary | ICD-10-CM

## 2023-07-01 DIAGNOSIS — J4489 Other specified chronic obstructive pulmonary disease: Secondary | ICD-10-CM

## 2023-07-01 DIAGNOSIS — R0602 Shortness of breath: Secondary | ICD-10-CM
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
## 2023-07-01 NOTE — Patient Instructions (Signed)
 VISIT SUMMARY:  Today, we discussed your worsening shortness of breath over the past six months, particularly during physical activities. We reviewed your history of COPD, asthma, rheumatoid arthritis, and ventricular tachycardia. We also talked about your previous issues with CPAP and the need for a new sleep study.  YOUR PLAN:  -CHRONIC OBSTRUCTIVE PULMONARY DISEASE (COPD): COPD is a chronic lung disease that makes it hard to breathe. We will order a chest CT scan to check your lungs and reassess based on the results.  -ASTHMA: Asthma is a condition where your airways narrow and swell, causing difficulty in breathing. You will continue using the Stiolto inhaler as you have not tolerated other inhalers.  -RHEUMATOID ARTHRITIS: Rheumatoid arthritis is an autoimmune disease that causes joint inflammation and pain. It can also affect your lungs. You will continue with your Enbrel injections.  -VENTRICULAR TACHYCARDIA: Ventricular tachycardia is a fast heart rate that starts in the heart's lower chambers. You will continue taking amiodarone  to manage this condition.  -GOALS OF CARE: We discussed your reluctance to use CPAP for sleep apnea. We will order a home sleep study and, if needed, a new CPAP machine based on the results.  INSTRUCTIONS:  Please schedule a follow-up appointment in 3 to 6 months. We will contact you if your CT scan results are abnormal.

## 2023-07-05 ENCOUNTER — Encounter (HOSPITAL_COMMUNITY): Payer: Self-pay

## 2023-07-07 ENCOUNTER — Ambulatory Visit
Admission: RE | Admit: 2023-07-07 | Discharge: 2023-07-07 | Disposition: A | Discharge status: Home / Self Care | Source: Ambulatory Visit | Attending: Pulmonary Disease | Admitting: Pulmonary Disease

## 2023-07-07 DIAGNOSIS — K449 Diaphragmatic hernia without obstruction or gangrene: Secondary | ICD-10-CM | POA: Diagnosis not present

## 2023-07-07 DIAGNOSIS — J439 Emphysema, unspecified: Secondary | ICD-10-CM | POA: Diagnosis not present

## 2023-07-07 DIAGNOSIS — R0602 Shortness of breath: Secondary | ICD-10-CM | POA: Diagnosis not present

## 2023-07-07 DIAGNOSIS — J4489 Other specified chronic obstructive pulmonary disease: Secondary | ICD-10-CM

## 2023-07-07 DIAGNOSIS — I700 Atherosclerosis of aorta: Secondary | ICD-10-CM | POA: Diagnosis not present

## 2023-07-12 ENCOUNTER — Ambulatory Visit: Payer: Self-pay | Admitting: Pulmonary Disease

## 2023-07-12 ENCOUNTER — Encounter: Admitting: General Practice

## 2023-07-12 ENCOUNTER — Ambulatory Visit

## 2023-07-12 VITALS — BP 136/74 | Ht 66.5 in | Wt 204.0 lb

## 2023-07-12 DIAGNOSIS — Z0000 Encounter for general adult medical examination without abnormal findings: Secondary | ICD-10-CM | POA: Diagnosis not present

## 2023-07-12 DIAGNOSIS — Z2821 Immunization not carried out because of patient refusal: Secondary | ICD-10-CM | POA: Diagnosis not present

## 2023-07-12 DIAGNOSIS — Z5320 Procedure and treatment not carried out because of patient's decision for unspecified reasons: Secondary | ICD-10-CM

## 2023-07-12 NOTE — Patient Instructions (Addendum)
 Ms. Kelsey Diaz , Thank you for taking time out of your busy schedule to complete your Annual Wellness Visit with me. I enjoyed our conversation and look forward to speaking with you again next year. I, as well as your care team,  appreciate your ongoing commitment to your health goals. Please review the following plan we discussed and let me know if I can assist you in the future. Your Game plan/ To Do List    Referrals: If you haven't heard from the office you've been referred to, please reach out to them at the phone provided.   Follow up Visits: Next Medicare AWV with our clinical staff: patient declined    Have you seen your provider in the last 6 months (3 months if uncontrolled diabetes)? Yes Next Office Visit with your provider: 07/14/2023  Clinician Recommendations:  Aim for 30 minutes of exercise or brisk walking, 6-8 glasses of water, and 5 servings of fruits and vegetables each day.       This is a list of the screening recommended for you and due dates:  Health Maintenance  Topic Date Due   Hepatitis C Screening  Never done   Zoster (Shingles) Vaccine (1 of 2) 01/[birth year]/1964   COVID-19 Vaccine (4 - 2024-25 season) 04/02/2027*   Flu Shot  09/24/2023   Medicare Annual Wellness Visit  07/11/2024   DTaP/Tdap/Td vaccine (4 - Td or Tdap) 05/03/2028   Pneumonia Vaccine  Completed   DEXA scan (bone density measurement)  Completed   HPV Vaccine  Aged Out   Meningitis B Vaccine  Aged Out   Colon Cancer Screening  Discontinued  *Topic was postponed. The date shown is not the original due date.    Advanced directives: (Declined) Advance directive discussed with you today. Even though you declined this today, please call our office should you change your mind, and we can give you the proper paperwork for you to fill out. Advance Care Planning is important because it:  [x]  Makes sure you receive the medical care that is consistent with your values, goals, and preferences  [x]  It provides  guidance to your family and loved ones and reduces their decisional burden about whether or not they are making the right decisions based on your wishes.  Follow the link provided in your after visit summary or read over the paperwork we have mailed to you to help you started getting your Advance Directives in place. If you need assistance in completing these, please reach out to us  so that we can help you!  See attachments for Preventive Care and Fall Prevention Tips. Managing Pain Without Opioids Opioids are strong medicines used to treat moderate to severe pain. For some people, especially those who have long-term (chronic) pain, opioids may not be the best choice for pain management due to: Side effects like nausea, constipation, and sleepiness. The risk of addiction (opioid use disorder). The longer you take opioids, the greater your risk of addiction. Pain that lasts for more than 3 months is called chronic pain. Managing chronic pain usually requires more than one approach and is often provided by a team of health care providers working together (multidisciplinary approach). Pain management may be done at a pain management center or pain clinic. How to manage pain without the use of opioids Use non-opioid medicines Non-opioid medicines for pain may include: Over-the-counter or prescription non-steroidal anti-inflammatory drugs (NSAIDs). These may be the first medicines used for pain. They work well for muscle and bone pain, and  they reduce swelling. Acetaminophen . This over-the-counter medicine may work well for milder pain but not swelling. Antidepressants. These may be used to treat chronic pain. A certain type of antidepressant (tricyclics) is often used. These medicines are given in lower doses for pain than when used for depression. Anticonvulsants. These are usually used to treat seizures but may also reduce nerve (neuropathic) pain. Muscle relaxants. These relieve pain caused by sudden  muscle tightening (spasms). You may also use a pain medicine that is applied to the skin as a patch, cream, or gel (topical analgesic), such as a numbing medicine. These may cause fewer side effects than medicines taken by mouth. Do certain therapies as directed Some therapies can help with pain management. They include: Physical therapy. You will do exercises to gain strength and flexibility. A physical therapist may teach you exercises to move and stretch parts of your body that are weak, stiff, or painful. You can learn these exercises at physical therapy visits and practice them at home. Physical therapy may also involve: Massage. Heat wraps or applying heat or cold to affected areas. Electrical signals that interrupt pain signals (transcutaneous electrical nerve stimulation, TENS). Weak lasers that reduce pain and swelling (low-level laser therapy). Signals from your body that help you learn to regulate pain (biofeedback). Occupational therapy. This helps you to learn ways to function at home and work with less pain. Recreational therapy. This involves trying new activities or hobbies, such as a physical activity or drawing. Mental health therapy, including: Cognitive behavioral therapy (CBT). This helps you learn coping skills for dealing with pain. Acceptance and commitment therapy (ACT) to change the way you think and react to pain. Relaxation therapies, including muscle relaxation exercises and mindfulness-based stress reduction. Pain management counseling. This may be individual, family, or group counseling.  Receive medical treatments Medical treatments for pain management include: Nerve block injections. These may include a pain blocker and anti-inflammatory medicines. You may have injections: Near the spine to relieve chronic back or neck pain. Into joints to relieve back or joint pain. Into nerve areas that supply a painful area to relieve body pain. Into muscles (trigger point  injections) to relieve some painful muscle conditions. A medical device placed near your spine to help block pain signals and relieve nerve pain or chronic back pain (spinal cord stimulation device). Acupuncture. Follow these instructions at home Medicines Take over-the-counter and prescription medicines only as told by your health care provider. If you are taking pain medicine, ask your health care providers about possible side effects to watch out for. Do not drive or use heavy machinery while taking prescription opioid pain medicine. Lifestyle  Do not use drugs or alcohol to reduce pain. If you drink alcohol, limit how much you have to: 0-1 drink a day for women who are not pregnant. 0-2 drinks a day for men. Know how much alcohol is in a drink. In the [place] one drink equals one 12 oz bottle of beer (355 mL), one 5 oz glass of wine (148 mL), or one 1 oz glass of hard liquor (44 mL). Do not use any products that contain nicotine or tobacco. These products include cigarettes, chewing tobacco, and vaping devices, such as e-cigarettes. If you need help quitting, ask your health care provider. Eat a healthy diet and maintain a healthy weight. Poor diet and excess weight may make pain worse. Eat foods that are high in fiber. These include fresh fruits and vegetables, whole grains, and beans. Limit foods that are  high in fat and processed sugars, such as fried and sweet foods. Exercise regularly. Exercise lowers stress and may help relieve pain. Ask your health care provider what activities and exercises are safe for you. If your health care provider approves, join an exercise class that combines movement and stress reduction. Examples include yoga and tai chi. Get enough sleep. Lack of sleep may make pain worse. Lower stress as much as possible. Practice stress reduction techniques as told by your therapist. General instructions Work with all your pain management providers to find the  treatments that work best for you. You are an important member of your pain management team. There are many things you can do to reduce pain on your own. Consider joining an online or in-person support group for people who have chronic pain. Keep all follow-up visits. This is important. Where to find more information You can find more information about managing pain without opioids from: American Academy of Pain Medicine: painmed.org Institute for Chronic Pain: instituteforchronicpain.org American Chronic Pain Association: theacpa.org Contact a health care provider if: You have side effects from pain medicine. Your pain gets worse or does not get better with treatments or home therapy. You are struggling with anxiety or depression. Summary Many types of pain can be managed without opioids. Chronic pain may respond better to pain management without opioids. Pain is best managed when you and a team of health care providers work together. Pain management without opioids may include non-opioid medicines, medical treatments, physical therapy, mental health therapy, and lifestyle changes. Tell your health care providers if your pain gets worse or is not being managed well enough. This information is not intended to replace advice given to you by your health care provider. Make sure you discuss any questions you have with your health care provider. Document Revised: 05/22/2020 Document Reviewed: 05/22/2020 Elsevier Patient Education  2024 [employer].

## 2023-07-12 NOTE — Progress Notes (Signed)
 Because this visit was a virtual/telehealth visit,  certain criteria was not obtained, such a blood pressure, CBG if applicable, and timed get up and go. Any medications not marked as "taking" were not mentioned during the medication reconciliation part of the visit. Any vitals not documented were not able to be obtained due to this being a telehealth visit or patient was unable to self-report a recent blood pressure reading due to a lack of equipment at home via telehealth. Vitals that have been documented are verbally provided by the patient.   This visit was performed by a medical professional under my direct supervision. I was immediately available for consultation/collaboration. I have reviewed and agree with the Annual Wellness Visit documentation.  Subjective:   Kelsey Diaz is a 78 y.o. who presents for a Medicare Wellness preventive visit.  As a reminder, Annual Wellness Visits don't include a physical exam, and some assessments may be limited, especially if this visit is performed virtually. We may recommend an in-person follow-up visit with your provider if needed.  Visit Complete: Virtual I connected with  Kelsey Diaz on 07/12/23 by a audio enabled telemedicine application and verified that I am speaking with the correct person using two identifiers.  Patient Location: Home  Provider Location: Home Office  I discussed the limitations of evaluation and management by telemedicine. The patient expressed understanding and agreed to proceed.  Vital Signs: Because this visit was a virtual/telehealth visit, some criteria may be missing or patient reported. Any vitals not documented were not able to be obtained and vitals that have been documented are patient reported.  VideoDeclined- This patient declined Librarian, academic. Therefore the visit was completed with audio only.  Persons Participating in Visit: Patient.  AWV Questionnaire: No: Patient  Medicare AWV questionnaire was not completed prior to this visit.  Cardiac Risk Factors include: advanced age (>77men, >19 women);hypertension;obesity (BMI >30kg/m2);Other (see comment), Risk factor comments: copd     Objective:     Today's Vitals   07/12/23 1358  BP: 136/74  Weight: 204 lb (92.5 kg)  Height: 5' 6.5" (1.689 m)  PainSc: 6    Body mass index is 32.43 kg/m.     07/12/2023    1:57 PM 06/17/2023    2:42 PM 05/15/2022    8:00 AM 05/15/2022    7:30 AM 05/04/2022    5:44 AM 05/03/2022    6:12 PM 04/06/2022   12:59 PM  Advanced Directives  Does Patient Have a Medical Advance Directive? Yes Yes  No Yes No Yes  Type of Estate agent of [place];Living will Healthcare Power of [employer] Power of [employer] Power of [employer] Power of [place];Living will  Does patient want to make changes to medical advance directive?    No - Patient declined No - Patient declined    Copy of Healthcare Power of Attorney in Chart? No - copy requested No - copy requested  No - copy requested No - copy requested    Would patient like information on creating a medical advance directive?   No - Patient declined        Current Medications (verified) Outpatient Encounter Medications as of 07/12/2023  Medication Sig   albuterol  (ACCUNEB ) 0.63 MG/3ML nebulizer solution Take 1 ampule by nebulization every 6 (six) hours as needed for wheezing or shortness of breath.   albuterol  (VENTOLIN  HFA) 108 (90 Base) MCG/ACT inhaler Inhale 2 puffs into the lungs as needed for  shortness of breath.   amiodarone  (PACERONE ) 100 MG tablet Take 1 tablet (100 mg total) by mouth daily.   aspirin  81 MG chewable tablet Chew 81 mg by mouth daily.   Calcium  Carbonate-Vitamin D  600-200 MG-UNIT TABS Take 1 tablet by mouth daily.    Carboxymethylcellulose Sodium (EYE DROPS OP) Place 2 drops into both eyes as needed (clear eyes).   Clobetasol  Prop Emollient Base 0.05 % emollient  cream Apply 1 application  topically 2 (two) times daily as needed (skin irritation).   CRANBERRY PO Take 1 tablet by mouth daily.   DULoxetine  (CYMBALTA ) 60 MG capsule Take 60 mg by mouth 2 (two) times daily.   etanercept (ENBREL) 50 MG/ML injection Inject 50 mg into the skin every Wednesday.   fluticasone  (FLONASE ) 50 MCG/ACT nasal spray Place 2 sprays into both nostrils at bedtime.   HYDROcodone -acetaminophen  (NORCO) 7.5-325 MG tablet Take 1 tablet by mouth every 6 (six) hours as needed for moderate pain (pain score 4-6) or severe pain (pain score 7-10).   [START ON 07/21/2023] HYDROcodone -acetaminophen  (NORCO) 7.5-325 MG tablet Take 1 tablet by mouth every 6 (six) hours as needed for moderate pain (pain score 4-6) or severe pain (pain score 7-10).   ibuprofen (ADVIL) 200 MG tablet Take 400 mg by mouth as needed for headache or moderate pain.   magnesium  oxide (MAG-OX) 400 (240 Mg) MG tablet Take 1 tablet (400 mg total) by mouth daily.   meloxicam  (MOBIC ) 15 MG tablet TAKE 1 TABLET (15 MG TOTAL) BY MOUTH DAILY.   metoprolol  succinate (TOPROL  XL) 25 MG 24 hr tablet Take 1 tablet (25 mg total) by mouth in the morning and at bedtime. (Patient taking differently: Take 25 mg by mouth daily.)   Multiple Vitamin (MULTI-VITAMIN PO) Take 1 tablet by mouth daily.    Omega-3 Fatty Acids (FISH OIL PO) Take 2 capsules by mouth daily.   omeprazole (PRILOSEC) 40 MG capsule Take 40 mg by mouth daily.   predniSONE  (STERAPRED UNI-PAK 21 TAB) 10 MG (21) TBPK tablet Take by mouth daily. Take 6 tabs by mouth daily  for 1 days, then 5 tabs for 1 days, then 4 tabs for 1 days, then 3 tabs for 1 days, 2 tabs for 1 days, then 1 tab by mouth daily for 1 days   rosuvastatin  (CRESTOR ) 20 MG tablet Take 20 mg by mouth every Monday.   Tiotropium Bromide-Olodaterol (STIOLTO RESPIMAT ) 2.5-2.5 MCG/ACT AERS Inhale 1 puff into the lungs in the morning and at bedtime.   amoxicillin -clavulanate (AUGMENTIN ) 875-125 MG tablet Take 1  tablet by mouth 2 (two) times daily. (Patient not taking: Reported on 07/12/2023)   benzonatate  (TESSALON ) 100 MG capsule Take 1 capsule (100 mg total) by mouth every 8 (eight) hours. (Patient not taking: Reported on 07/12/2023)   doxycycline  (VIBRAMYCIN ) 100 MG capsule Take 1 capsule (100 mg total) by mouth 2 (two) times daily. (Patient not taking: Reported on 07/12/2023)   No facility-administered encounter medications on file as of 07/12/2023.    Allergies (verified) Morphine, Advair diskus [fluticasone -salmeterol], Ambien [zolpidem tartrate], Amlodipine besylate, Azithromycin , Bextra [valdecoxib], Bupropion, Captopril, Irbesartan , Latex, Lisinopril, Pacerone  [amiodarone ], Pulmicort [budesonide], Savella [milnacipran hcl], Spiriva [tiotropium bromide monohydrate], Suvorexant, and Tiotropium bromide monohydrate   History: Past Medical History:  Diagnosis Date   Abnormal nuclear stress test 06/03/2017   Allergic rhinitis    Anxiety    Asthma    Bacteremia 04/05/2021   Bilateral hip pain 11/28/2018   Chest pain 06/03/2017   Colitis  COPD (chronic obstructive pulmonary disease) (HCC)    Dizzy 06/13/2012   E-coli UTI 04/04/2021   Educated about COVID-19 virus infection 07/02/2019   Fibromyalgia 05/16/2019   GERD (gastroesophageal reflux disease)    With esophageal strictures   Hand pain 11/28/2018   Hypertension    Hyperthyroidism    Hypomagnesemia 05/03/2022   Normocytic anemia 05/03/2022   OSA (obstructive sleep apnea) 02/09/2012   No mask.  Did not tolerate   Osteoarthritis    PNA (pneumonia) 09/13/2017   Rheumatoid arthritis (HCC) 10/03/2018   Sepsis due to Escherichia coli (E. coli) (HCC) 04/05/2021   SOB (shortness of breath) 07/02/2019   Thrombocytopenia (HCC) 05/03/2022   Past Surgical History:  Procedure Laterality Date   BREAST BIOPSY     x2   CATARACT EXTRACTION W/PHACO Left 09/27/2018   Procedure: CATARACT EXTRACTION PHACO AND INTRAOCULAR LENS PLACEMENT (IOC)  LEFT;  Surgeon: Clair Crews, MD;  Location: [facility];  Service: Ophthalmology;  Laterality: Left;   CATARACT EXTRACTION W/PHACO Right 11/29/2018   Procedure: CATARACT EXTRACTION PHACO AND INTRAOCULAR LENS PLACEMENT (IOC) RIGHT;  Surgeon: Clair Crews, MD;  Location: [facility];  Service: Ophthalmology;  Laterality: Right;  1:22 20.1% 16.64   CHOLECYSTECTOMY     2005   KNEE SURGERY Right 2013   two torn ligaments repaired.    LEFT HEART CATH AND CORONARY ANGIOGRAPHY N/A 06/08/2017   Procedure: LEFT HEART CATH AND CORONARY ANGIOGRAPHY;  Surgeon: Millicent Ally, MD;  Location: [facility];  Service: Cardiovascular;  Laterality: N/A;   LEFT HEART CATH AND CORONARY ANGIOGRAPHY N/A 12/12/2020   Procedure: LEFT HEART CATH AND CORONARY ANGIOGRAPHY;  Surgeon: Avanell Leigh, MD;  Location: [facility];  Service: Cardiovascular;  Laterality: N/A;   NASAL SINUS SURGERY     V TACH ABLATION N/A 12/16/2020   Procedure: V TACH ABLATION;  Surgeon: Boyce Byes, MD;  Location: [facility];  Service: Cardiovascular;  Laterality: N/A;   VAGINAL HYSTERECTOMY     Family History  Problem Relation Age of Onset   Hypertension Mother    Diabetes Mother    Allergies Mother    Heart attack Father 26   Cancer Father        kidney   Emphysema Father    Heart disease Father        CHF age 61   Heart attack Brother 76       Died of MI age 4   Social History   Socioeconomic History   Marital status: Married    Spouse name: Not on file   Number of children: 3   Years of education: Not on file   Highest education level: Not on file  Occupational History   Occupation: Retired    Associate Professor: [employer]  Tobacco Use   Smoking status: Former    Current packs/day: 0.00    Average packs/day: 2.0 packs/day for 40.0 years (80.0 ttl pk-yrs)    Types: Cigarettes    Start date: 02/24/1959    Quit date: 02/24/1999    Years since quitting: 24.3   Smokeless  tobacco: Never  Vaping Use   Vaping status: Never Used  Substance and Sexual Activity   Alcohol use: Not Currently    Comment:     Drug use: No   Sexual activity: Not on file  Other Topics Concern   Not on file  Social History Narrative   Lives with husband.  5 children together. (She  has 3).     Social Drivers of Corporate investment banker Strain: Low Risk  (07/12/2023)   Overall Financial Resource Strain (CARDIA)    Difficulty of Paying Living Expenses: Not hard at all  Food Insecurity: No Food Insecurity (07/12/2023)   Hunger Vital Sign    Worried About Running Out of Food in the Last Year: Never true    Ran Out of Food in the Last Year: Never true  Transportation Needs: No Transportation Needs (07/12/2023)   PRAPARE - Administrator, Civil Service (Medical): No    Lack of Transportation (Non-Medical): No  Physical Activity: Insufficiently Active (07/12/2023)   Exercise Vital Sign    Days of Exercise per Week: 7 days    Minutes of Exercise per Session: 20 min  Stress: No Stress Concern Present (07/12/2023)   [employer] of Occupational Health - Occupational Stress Questionnaire    Feeling of Stress : Not at all  Social Connections: Socially Isolated (07/12/2023)   Social Connection and Isolation Panel [NHANES]    Frequency of Communication with Friends and Family: Three times a week    Frequency of Social Gatherings with Friends and Family: Three times a week    Attends Religious Services: Never    Active Member of Clubs or Organizations: No    Attends Banker Meetings: Never    Marital Status: Widowed    Tobacco Counseling Counseling given: Not Answered    Clinical Intake:  Pre-visit preparation completed: Yes  Pain : 0-10 Pain Score: 6  Pain Type: Acute pain Pain Location: Toe (Comment which one) Pain Orientation: Right Pain Descriptors / Indicators: Aching Pain Onset: Today Pain Frequency: Occasional Effect of Pain on Daily  Activities: walking sometimes     BMI - recorded: 32.43 Nutritional Status: BMI > 30  Obese Nutritional Risks: None Diabetes: No  Lab Results  Component Value Date   HGBA1C 6.1 (H) 12/11/2020     How often do you need to have someone help you when you read instructions, pamphlets, or other written materials from your doctor or pharmacy?: 1 - Never What is the last grade level you completed in school?: GED  Interpreter Needed?: No  Information entered by :: Ruthann Cover Kevron Patella,CMa   Activities of Daily Living     07/12/2023    2:02 PM  In your present state of health, do you have any difficulty performing the following activities:  Hearing? 0  Vision? 0  Difficulty concentrating or making decisions? 1  Walking or climbing stairs? 0  Dressing or bathing? 0  Doing errands, shopping? 0  Preparing Food and eating ? N  Using the Toilet? N  In the past six months, have you accidently leaked urine? Y  Do you have problems with loss of bowel control? N  Managing your Medications? N  Managing your Finances? N  Housekeeping or managing your Housekeeping? N    Patient Care Team: Jolanda Nation, NP as PCP - General (General Practice) Eilleen Grates, MD as PCP - Cardiology (Cardiology) Boyce Byes, MD as PCP - Electrophysiology (Cardiology) Mannam, Praveen, MD as Consulting Physician (Pulmonary Disease)  Indicate any recent Medical Services you may have received from other than [facility] providers in the past year (date may be approximate).     Assessment:    This is a routine wellness examination for Kelsey Diaz.  Hearing/Vision screen Hearing Screening - Comments:: No difficulties hearing  Vision Screening - Comments:: Patient wears glasses    Goals  Addressed             This Visit's Progress    Patient Stated       To go see her son at the beach       Depression Screen     07/12/2023    2:05 PM 06/17/2023    2:41 PM 03/30/2023    2:38 PM 03/15/2023    3:39 PM  04/06/2022   12:59 PM 12/30/2021    1:15 PM 02/04/2021    2:55 PM  PHQ 2/9 Scores  PHQ - 2 Score 2 1 1 3  0 0 0  PHQ- 9 Score 3  13 10        Fall Risk     07/12/2023    2:00 PM 07/01/2023    1:53 PM 06/17/2023    2:41 PM 03/30/2023    2:37 PM 03/15/2023    3:39 PM  Fall Risk   Falls in the past year? 1 1 1 1 1   Number falls in past yr: 0 0 1 1 1   Injury with Fall? 0 1 1 1 1   Risk for fall due to : No Fall Risks   Impaired balance/gait History of fall(s)  Follow up Falls prevention discussed;Falls evaluation completed   Falls evaluation completed Falls evaluation completed    MEDICARE RISK AT HOME:  Medicare Risk at Home Any stairs in or around the home?: Yes If so, are there any without handrails?: No Home free of loose throw rugs in walkways, pet beds, electrical cords, etc?: Yes Adequate lighting in your home to reduce risk of falls?: Yes Life alert?: No Use of a cane, walker or w/c?: No Grab bars in the bathroom?: Yes Shower chair or bench in shower?: Yes Elevated toilet seat or a handicapped toilet?: Yes  TIMED UP AND GO:  Was the test performed?  No  Cognitive Function: 6CIT completed        07/12/2023    2:00 PM  6CIT Screen  What Year? 0 points  What month? 0 points  What time? 0 points  Count back from 20 0 points  Months in reverse 0 points  Repeat phrase 0 points  Total Score 0 points    Immunizations Immunization History  Administered Date(s) Administered   Fluad Quad(high Dose 65+) 12/13/2019   Influenza Split 11/24/2010   Influenza Whole 11/24/2011   Influenza, High Dose Seasonal PF 01/29/2011, 11/14/2012, 11/30/2014, 01/02/2016, 11/02/2016, 12/10/2017, 01/15/2021   Moderna Sars-Covid-2 Vaccination 03/05/2020   PFIZER(Purple Top)SARS-COV-2 Vaccination 04/01/2019, 04/22/2019   Pneumococcal Conjugate-13 03/16/2013   Pneumococcal Polysaccharide-23 08/06/2010, 11/24/2010, 03/01/2017   Td 05/04/2018   Td (Adult),5 Lf Tetanus Toxid, Preservative Free  05/04/2018   Tdap 04/09/2008   Zoster, Live 04/19/2012    Screening Tests Health Maintenance  Topic Date Due   Hepatitis C Screening  Never done   Zoster Vaccines- Shingrix (1 of 2) 01/17/1965   COVID-19 Vaccine (4 - 2024-25 season) 04/02/2027 (Originally 10/25/2022)   INFLUENZA VACCINE  09/24/2023   Medicare Annual Wellness (AWV)  07/11/2024   DTaP/Tdap/Td (4 - Td or Tdap) 05/03/2028   Pneumonia Vaccine 87+ Years old  Completed   DEXA SCAN  Completed   HPV VACCINES  Aged Out   Meningococcal B Vaccine  Aged Out   Colonoscopy  Discontinued    Health Maintenance  Health Maintenance Due  Topic Date Due   Hepatitis C Screening  Never done   Zoster Vaccines- Shingrix (1 of 2) 01/17/1964   Health Maintenance  Items Addressed:declined shingles and hep c screening  Additional Screening:  Vision Screening: Recommended annual ophthalmology exams for early detection of glaucoma and other disorders of the eye.  Dental Screening: Recommended annual dental exams for proper oral hygiene  Community Resource Referral / Chronic Care Management: CRR required this visit?  No   CCM required this visit?  No   Plan:    I have personally reviewed and noted the following in the patient's chart:   Medical and social history Use of alcohol, tobacco or illicit drugs  Current medications and supplements including opioid prescriptions. Patient is currently taking opioid prescriptions. Information provided to patient regarding non-opioid alternatives. Patient advised to discuss non-opioid treatment plan with their provider. Functional ability and status Nutritional status Physical activity Advanced directives List of other physicians Hospitalizations, surgeries, and ER visits in previous 12 months Vitals Screenings to include cognitive, depression, and falls Referrals and appointments  In addition, I have reviewed and discussed with patient certain preventive protocols, quality metrics, and  best practice recommendations. A written personalized care plan for preventive services as well as general preventive health recommendations were provided to patient.   Freeda Jerry, [place]   07/12/2023   After Visit Summary: (MyChart) Due to this being a telephonic visit, the after visit summary with patients personalized plan was offered to patient via MyChart   Notes: Nothing significant to report at this time.

## 2023-07-14 ENCOUNTER — Ambulatory Visit (INDEPENDENT_AMBULATORY_CARE_PROVIDER_SITE_OTHER): Admitting: General Practice

## 2023-07-14 ENCOUNTER — Encounter: Payer: Self-pay | Admitting: General Practice

## 2023-07-14 VITALS — BP 120/68 | HR 55 | Temp 98.2°F | Ht 66.5 in | Wt 205.0 lb

## 2023-07-14 DIAGNOSIS — R7303 Prediabetes: Secondary | ICD-10-CM | POA: Insufficient documentation

## 2023-07-14 DIAGNOSIS — R300 Dysuria: Secondary | ICD-10-CM | POA: Insufficient documentation

## 2023-07-14 DIAGNOSIS — Z0001 Encounter for general adult medical examination with abnormal findings: Secondary | ICD-10-CM | POA: Insufficient documentation

## 2023-07-14 DIAGNOSIS — I10 Essential (primary) hypertension: Secondary | ICD-10-CM

## 2023-07-14 DIAGNOSIS — Z1231 Encounter for screening mammogram for malignant neoplasm of breast: Secondary | ICD-10-CM

## 2023-07-14 DIAGNOSIS — Z1159 Encounter for screening for other viral diseases: Secondary | ICD-10-CM | POA: Diagnosis not present

## 2023-07-14 LAB — LIPID PANEL
Cholesterol: 144 mg/dL (ref 0–200)
HDL: 72.3 mg/dL (ref >39.00)
LDL Cholesterol: 30 mg/dL (ref 0–99)
NonHDL: 71.28
Total CHOL/HDL Ratio: 2
Triglycerides: 205 mg/dL — ABNORMAL HIGH (ref 0.0–149.0)
VLDL: 41 mg/dL — ABNORMAL HIGH (ref 0.0–40.0)

## 2023-07-14 LAB — COMPREHENSIVE METABOLIC PANEL WITH GFR
ALT: 19 U/L (ref 0–35)
AST: 23 U/L (ref 0–37)
Albumin: 4.2 g/dL (ref 3.5–5.2)
Alkaline Phosphatase: 70 U/L (ref 39–117)
BUN: 19 mg/dL (ref 6–23)
CO2: 32 meq/L (ref 19–32)
Calcium: 9.8 mg/dL (ref 8.4–10.5)
Chloride: 99 meq/L (ref 96–112)
Creatinine, Ser: 0.87 mg/dL (ref 0.40–1.20)
GFR: 63.78 mL/min (ref >60.00)
Glucose, Bld: 82 mg/dL (ref 70–99)
Potassium: 4.7 meq/L (ref 3.5–5.1)
Sodium: 138 meq/L (ref 135–145)
Total Bilirubin: 0.7 mg/dL (ref 0.2–1.2)
Total Protein: 6.6 g/dL (ref 6.0–8.3)

## 2023-07-14 LAB — POC URINALSYSI DIPSTICK (AUTOMATED)
Bilirubin, UA: 2
Blood, UA: NEGATIVE
Glucose, UA: POSITIVE — AB
Ketones, UA: 15
Nitrite, UA: NEGATIVE
Protein, UA: POSITIVE — AB
Spec Grav, UA: 1.025 (ref 1.010–1.025)
Urobilinogen, UA: 1 U/dL
pH, UA: 5 (ref 5.0–8.0)

## 2023-07-14 LAB — CBC
HCT: 41.1 % (ref 36.0–46.0)
Hemoglobin: 13.5 g/dL (ref 12.0–15.0)
MCHC: 32.8 g/dL (ref 30.0–36.0)
MCV: 88.9 fl (ref 78.0–100.0)
Platelets: 188 10*3/uL (ref 150.0–400.0)
RBC: 4.63 Mil/uL (ref 3.87–5.11)
RDW: 15.1 % (ref 11.5–15.5)
WBC: 7.4 10*3/uL (ref 4.0–10.5)

## 2023-07-14 LAB — TSH: TSH: 10.56 u[IU]/mL — ABNORMAL HIGH (ref 0.35–5.50)

## 2023-07-14 LAB — HEMOGLOBIN A1C: Hgb A1c MFr Bld: 6.6 % — ABNORMAL HIGH (ref 4.6–6.5)

## 2023-07-14 NOTE — Patient Instructions (Addendum)
 Stop by the lab prior to leaving today. I will notify you of your results once received.   Schedule shingles vaccine at the pharmacy.   Your blood sugar levels are in the prediabetes range.  Prediabetes means that you do not have diabetes, but you are at risk for developing diabetes if you do not work to reduce your blood sugar levels.   Decrease consumption of fast food, fried food, processed carbohydrates (chips, cookies, etc), sugary drinks, sweets. Increase consumption of fresh fruits and vegetables, whole grains, water.   You should be getting 150 minutes of moderate intensity exercise weekly.  We will continue to monitor these levels in the future.  Follow up in 6 months.  It was a pleasure to see you today!

## 2023-07-14 NOTE — Progress Notes (Signed)
 Established Patient Office Visit  Subjective   Patient ID: Kelsey Diaz, female    DOB: 03/04/1945  Age: 78 y.o. MRN: [id number]  Chief Complaint  Patient presents with   Annual Exam    HPI  Kelsey Diaz is a 78 year old female with past medical history of HTN, COPD, OSA, RA, fibromyalgia presents today for complete physical and follow up of chronic conditions.  Her son-in-law, Kurt Phi, is also present today.   Immunizations: -Tetanus: Completed in 2020 -Shingles: Completed zoster in 2014.  -Pneumonia: Completed   Diet: Fair diet.  Exercise: No regular exercise. Did do physical therapy recently. She does walk in her hallway a lot and chair exercises.   Eye exam: Completed several years ago.  Dental exam: dentures upper and lower    Mammogram: Completed in 2024 Bone Density Scan: Completed in 2024  Dysuria: symptom onset was two months ago. She has painful urination toward the end of her stream, burning with urination and lower abdominal pain. She denies any blood in urine, flank pain, fever, vomiting, chest pain or shortness of breath. She has a history of recurrent UTI. She was evaluated by Dr. Cherlyn Cornet in March for similar symptoms and at the time was not treated since the urine culture was negative.    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   Past Medical History:  Diagnosis Date   Abnormal nuclear stress test 06/03/2017    Allergic rhinitis    Anxiety    Asthma    Bacteremia 04/05/2021   Bilateral hip pain 11/28/2018   Chest pain 06/03/2017   Colitis    COPD (chronic obstructive pulmonary disease) (HCC)    Dizzy 06/13/2012   E-coli UTI 04/04/2021   Educated about COVID-19 virus infection 07/02/2019   Fibromyalgia 05/16/2019   GERD (gastroesophageal reflux disease)    With esophageal strictures   Hand pain 11/28/2018   Hypertension    Hyperthyroidism    Hypomagnesemia 05/03/2022   Normocytic anemia 05/03/2022   OSA (obstructive sleep apnea) 02/09/2012   No mask.  Did not tolerate   Osteoarthritis    PNA (pneumonia) 09/13/2017   Rheumatoid arthritis (HCC) 10/03/2018   Sepsis due to Escherichia coli (E. coli) (HCC) 04/05/2021   SOB (shortness of breath) 07/02/2019   Thrombocytopenia (HCC) 05/03/2022   Past Surgical History:  Procedure Laterality Date   BREAST BIOPSY     x2   CATARACT EXTRACTION W/PHACO Left 09/27/2018   Procedure: CATARACT EXTRACTION PHACO AND INTRAOCULAR LENS PLACEMENT (IOC) LEFT;  Surgeon: Clair Crews, MD;  Location: [facility];  Service: Ophthalmology;  Laterality: Left;   CATARACT EXTRACTION W/PHACO Right 11/29/2018   Procedure: CATARACT EXTRACTION PHACO AND INTRAOCULAR LENS PLACEMENT (IOC) RIGHT;  Surgeon: Clair Crews, MD;  Location: [facility];  Service: Ophthalmology;  Laterality: Right;  1:22 20.1% 16.64   CHOLECYSTECTOMY     2005   KNEE SURGERY Right 2013   two torn ligaments repaired.  LEFT HEART CATH AND CORONARY ANGIOGRAPHY N/A 06/08/2017   Procedure: LEFT HEART CATH AND CORONARY ANGIOGRAPHY;  Surgeon: Millicent Ally, MD;  Location: [facility];  Service: Cardiovascular;  Laterality: N/A;   LEFT HEART CATH AND CORONARY ANGIOGRAPHY N/A 12/12/2020   Procedure: LEFT HEART CATH AND CORONARY ANGIOGRAPHY;  Surgeon: Avanell Leigh, MD;  Location: [facility];  Service: Cardiovascular;  Laterality: N/A;   NASAL SINUS SURGERY      V TACH ABLATION N/A 12/16/2020   Procedure: V TACH ABLATION;  Surgeon: Boyce Byes, MD;  Location: [facility];  Service: Cardiovascular;  Laterality: N/A;   VAGINAL HYSTERECTOMY     Allergies  Allergen Reactions   Morphine Other (See Comments)    Stopped heart   Advair Diskus [Fluticasone -Salmeterol] Other (See Comments)    Sensitivity to smells   Ambien [Zolpidem Tartrate] Other (See Comments)    irritable   Amlodipine Besylate Other (See Comments)    Feels bad   Azithromycin  Other (See Comments)     Unknown   Bextra [Valdecoxib] Other (See Comments)    Stomach issues   Bupropion Other (See Comments)    involuntary muscle movements   Captopril Other (See Comments)    Ache   Irbesartan  Other (See Comments)    dizzy   Latex Itching    Bandaids only   Lisinopril Cough   Pacerone  [Amiodarone ] Other (See Comments)    falls   Pulmicort [Budesonide] Other (See Comments)    Worse breathing, sensitivity to smells   Savella [Milnacipran Hcl] Other (See Comments)    Mental fog   Spiriva [Tiotropium Bromide Monohydrate] Other (See Comments)    Dry mouth   Suvorexant Other (See Comments)     not effective   Tiotropium Bromide Monohydrate Other (See Comments)     dry mouth         07/14/2023   11:44 AM 07/12/2023    2:05 PM 06/17/2023    2:41 PM  Depression screen PHQ 2/9  Decreased Interest 1 1 1   Down, Depressed, Hopeless 0 1 0  PHQ - 2 Score 1 2 1   Altered sleeping 3 0   Tired, decreased energy 3 0   Change in appetite 3 1   Feeling bad or failure about yourself  0 0   Trouble concentrating 2 0   Moving slowly or fidgety/restless 2 0   Suicidal thoughts 0 0   PHQ-9 Score 14 3   Difficult doing work/chores Somewhat difficult Not difficult at all        07/14/2023   11:44 AM 03/30/2023    2:38 PM 03/15/2023    3:39 PM  GAD 7 : Generalized Anxiety Score  Nervous, Anxious, on Edge 1 1 1   Control/stop worrying 0 0 0  Worry too much - different things 2  1 1   Trouble relaxing 2 0 3  Restless 1 0 0  Easily annoyed or irritable 2 0 1  Afraid - awful might happen 0 0 0  Total GAD 7 Score 8 2 6   Anxiety Difficulty Somewhat difficult Somewhat difficult Not difficult at all      Review of Systems  Constitutional:  Negative for chills, fever, malaise/fatigue and weight loss.  HENT:  Negative for congestion, ear discharge, ear pain, hearing loss, nosebleeds, sinus pain, sore throat and tinnitus.   Eyes:  Negative for blurred vision, double vision, pain, discharge and redness.  Respiratory:  Negative for cough, shortness of  breath, wheezing and stridor.   Cardiovascular:  Negative for chest pain, palpitations and leg swelling.  Gastrointestinal:  Positive for abdominal pain. Negative for constipation, diarrhea, heartburn, nausea and vomiting.  Genitourinary:  Positive for dysuria and urgency. Negative for flank pain, frequency and hematuria.  Musculoskeletal:  Negative for myalgias.  Skin:  Negative for rash.  Neurological:  Negative for dizziness, tingling, seizures, weakness and headaches.  Psychiatric/Behavioral:  Negative for depression, substance abuse and suicidal ideas. The patient is not nervous/anxious.       Objective:     BP 120/68 (BP Location: Left Arm, Patient Position: Sitting, Cuff Size: Normal)   Pulse (!) 55   Temp 98.2 F (36.8 C) (Oral)   Ht 5' 6.5" (1.689 m)   Wt 205 lb (93 kg)   SpO2 94%   BMI 32.59 kg/m  BP Readings from Last 3 Encounters:  07/14/23 120/68  07/12/23 136/74  07/01/23 136/74   Wt Readings from Last 3 Encounters:  07/14/23 205 lb (93 kg)  07/12/23 204 lb (92.5 kg)  07/01/23 204 lb (92.5 kg)      Physical Exam Vitals and nursing note reviewed.  Constitutional:      Appearance: Normal appearance.  HENT:     Head: Normocephalic and atraumatic.     Right Ear: Tympanic membrane, ear canal and external ear normal.     Left Ear: Tympanic membrane, ear canal and external ear normal.      Nose: Nose normal.     Mouth/Throat:     Mouth: Mucous membranes are moist.     Pharynx: Oropharynx is clear.  Eyes:     Conjunctiva/sclera: Conjunctivae normal.     Pupils: Pupils are equal, round, and reactive to light.  Cardiovascular:     Rate and Rhythm: Normal rate and regular rhythm.     Pulses: Normal pulses.     Heart sounds: Normal heart sounds.  Pulmonary:     Effort: Pulmonary effort is normal.     Breath sounds: Normal breath sounds.  Abdominal:     General: Abdomen is flat. Bowel sounds are normal.     Palpations: Abdomen is soft.     Tenderness: There is abdominal tenderness.     Comments: Lower abdominal pain.  Musculoskeletal:        General: Normal range of motion.     Cervical back: Normal range of motion.  Skin:    General: Skin is warm and dry.     Capillary Refill: Capillary refill takes less than 2 seconds.  Neurological:     General: No focal deficit present.     Mental Status: She is alert and oriented to person, place, and time. Mental status is at baseline.  Psychiatric:        Mood and Affect: Mood normal.        Behavior: Behavior normal.        Thought Content: Thought content normal.        Judgment: Judgment normal.      Results for orders placed or performed in visit on 07/14/23  POCT Urinalysis Dipstick (Automated)  Result Value Ref Range   Color, UA dark yellow    Clarity, UA clear    Glucose, UA Positive (A) Negative   Bilirubin, UA 2 mg/dL    Ketones, UA 15 mg/dL    Spec Grav, UA 2.440 1.010 - 1.025   Blood, UA neg    pH, UA 5.0 5.0 - 8.0   Protein, UA Positive (  A) Negative   Urobilinogen, UA 1.0 0.2 or 1.0 E.U./dL   Nitrite, UA neg    Leukocytes, UA Moderate (2+) (A) Negative       The ASCVD Risk score (Arnett DK, et al., 2019) failed to calculate for the following reasons:   The valid total cholesterol range is 130 to 320 mg/dL    Assessment & Plan:  Annual visit for general adult medical examination with abnormal  findings Assessment & Plan: Immunizations shingrix due Mammogram due, orders placed. Colonoscopy UTD  Discussed the importance of a healthy diet and regular exercise in order for weight loss, and to reduce the risk of further co-morbidity.  Exam stable. Labs pending.  Follow up in 1 year for repeat physical.   Orders: -     CBC -     Comprehensive metabolic panel with GFR -     TSH -     Lipid panel  Encounter for screening mammogram for malignant neoplasm of breast -     3D Screening Mammogram, Left and Right; Future  Hypertension, unspecified type Assessment & Plan: At goal today.   Following with cardiology.   Orders: -     CBC -     Comprehensive metabolic panel with GFR -     TSH -     Lipid panel  Prediabetes Assessment & Plan: Hemoglobin A1c pending.  Orders: -     Hemoglobin A1c  Need for hepatitis C screening test -     Hepatitis C antibody  Dysuria Assessment & Plan: POC UA shows 2 + leuks, negative for nitrites and positive for protein. Negative for blood. Urine culture pending.  Will treat once the urine culture comes back.  Patient verbalizes understanding.  Given her history, will consider urology referral if culture is negative due to ongoing symptoms.  Await results.  Orders: -     POCT Urinalysis Dipstick (Automated) -     Urine Culture     Return in about 6 months (around 01/14/2024) for chronic care management.Jolanda Nation, NP

## 2023-07-14 NOTE — Assessment & Plan Note (Signed)
 At goal today.   Following with cardiology.

## 2023-07-14 NOTE — Assessment & Plan Note (Signed)
 Immunizations shingrix due Mammogram due, orders placed. Colonoscopy UTD  Discussed the importance of a healthy diet and regular exercise in order for weight loss, and to reduce the risk of further co-morbidity.  Exam stable. Labs pending.  Follow up in 1 year for repeat physical.

## 2023-07-14 NOTE — Assessment & Plan Note (Signed)
 Hemoglobin A1c pending

## 2023-07-14 NOTE — Assessment & Plan Note (Addendum)
 POC UA shows 2 + leuks, negative for nitrites and positive for protein. Negative for blood. Urine culture pending.  Will treat once the urine culture comes back.  Patient verbalizes understanding.  Given her history, will consider urology referral if culture is negative due to ongoing symptoms.  Await results.

## 2023-07-15 ENCOUNTER — Ambulatory Visit: Payer: Self-pay | Admitting: General Practice

## 2023-07-15 DIAGNOSIS — E039 Hypothyroidism, unspecified: Secondary | ICD-10-CM

## 2023-07-15 LAB — HEPATITIS C ANTIBODY: Hepatitis C Ab: NONREACTIVE

## 2023-07-15 LAB — URINE CULTURE
MICRO NUMBER:: 16484645
SPECIMEN QUALITY:: ADEQUATE

## 2023-07-15 MED ORDER — LEVOTHYROXINE SODIUM 25 MCG PO TABS: 25.0000 ug | ORAL_TABLET | Freq: Every day | ORAL | 0 refills | Status: DC

## 2023-07-15 NOTE — Telephone Encounter (Signed)
 Called and discussed results with patient's daughter, Brian Campanile.  Agreeable to start Levothyroxine 25 mcg. Rx sent to the pharmacy.   Would like to monitor diet regarding new diagnosis of diabetes and elevated cholesterol levels.  She has been taking her crestor  20 mg once a week as prescribed. Discussed with daughter at length that we may need to change that.   She will call and move her follow up to 3 months with me in the office to recheck.  Advised patient's daughter to schedule diabetic eye exam.  Will complete foot exam, UCR at next visit.   Discussed the importance of monitoring diet, limiting carbs and portion control.  Limiting her sweet tea intake.   Verbalizes understanding.

## 2023-07-15 NOTE — Telephone Encounter (Signed)
 Copied from CRM [id number]. Topic: Clinical - Lab/Test Results >> Jul 15, 2023  8:21 AM Kita Perish H wrote: Reason for CRM: Patients daughter Brian Campanile returning call states she missed call from office from the notes looks like Donnel Gail called regarding labs, Brian Campanile states office can call back when they get a chance.  Brian Campanile [phone]

## 2023-07-15 NOTE — Addendum Note (Signed)
 Addended by: Jolanda Nation on: 07/15/2023 09:43 AM   Modules accepted: Orders

## 2023-07-27 ENCOUNTER — Encounter

## 2023-07-27 DIAGNOSIS — G4730 Sleep apnea, unspecified: Secondary | ICD-10-CM | POA: Diagnosis not present

## 2023-07-27 DIAGNOSIS — G4733 Obstructive sleep apnea (adult) (pediatric): Secondary | ICD-10-CM

## 2023-08-09 ENCOUNTER — Ambulatory Visit

## 2023-08-10 ENCOUNTER — Ambulatory Visit
Admission: RE | Admit: 2023-08-10 | Discharge: 2023-08-10 | Disposition: A | Discharge status: Home / Self Care | Source: Ambulatory Visit | Attending: General Practice | Admitting: General Practice

## 2023-08-10 DIAGNOSIS — Z1231 Encounter for screening mammogram for malignant neoplasm of breast: Secondary | ICD-10-CM

## 2023-08-15 ENCOUNTER — Other Ambulatory Visit: Payer: Self-pay | Admitting: General Practice

## 2023-08-17 ENCOUNTER — Encounter: Payer: Self-pay | Admitting: Anesthesiology

## 2023-08-17 ENCOUNTER — Ambulatory Visit: Attending: Anesthesiology | Admitting: Anesthesiology

## 2023-08-17 DIAGNOSIS — M79642 Pain in left hand: Secondary | ICD-10-CM | POA: Diagnosis not present

## 2023-08-17 DIAGNOSIS — M0579 Rheumatoid arthritis with rheumatoid factor of multiple sites without organ or systems involvement: Secondary | ICD-10-CM

## 2023-08-17 DIAGNOSIS — G894 Chronic pain syndrome: Secondary | ICD-10-CM | POA: Diagnosis not present

## 2023-08-17 DIAGNOSIS — M797 Fibromyalgia: Secondary | ICD-10-CM | POA: Diagnosis not present

## 2023-08-17 DIAGNOSIS — M25552 Pain in left hip: Secondary | ICD-10-CM | POA: Diagnosis not present

## 2023-08-17 DIAGNOSIS — M25551 Pain in right hip: Secondary | ICD-10-CM

## 2023-08-17 DIAGNOSIS — M5432 Sciatica, left side: Secondary | ICD-10-CM

## 2023-08-17 DIAGNOSIS — M5431 Sciatica, right side: Secondary | ICD-10-CM

## 2023-08-17 DIAGNOSIS — M4716 Other spondylosis with myelopathy, lumbar region: Secondary | ICD-10-CM | POA: Diagnosis not present

## 2023-08-17 DIAGNOSIS — M79641 Pain in right hand: Secondary | ICD-10-CM

## 2023-08-17 DIAGNOSIS — M47817 Spondylosis without myelopathy or radiculopathy, lumbosacral region: Secondary | ICD-10-CM

## 2023-08-17 DIAGNOSIS — M48062 Spinal stenosis, lumbar region with neurogenic claudication: Secondary | ICD-10-CM

## 2023-08-17 DIAGNOSIS — F1190 Opioid use, unspecified, uncomplicated: Secondary | ICD-10-CM

## 2023-08-17 DIAGNOSIS — M48061 Spinal stenosis, lumbar region without neurogenic claudication: Secondary | ICD-10-CM | POA: Insufficient documentation

## 2023-08-17 MED ORDER — HYDROCODONE-ACETAMINOPHEN 7.5-325 MG PO TABS: 1.0000 | ORAL_TABLET | Freq: Four times a day (QID) | ORAL | 0 refills | Status: AC | PRN

## 2023-08-17 MED ORDER — HYDROCODONE-ACETAMINOPHEN 7.5-325 MG PO TABS: 1.0000 | ORAL_TABLET | Freq: Four times a day (QID) | ORAL | 0 refills | Status: DC | PRN

## 2023-08-18 ENCOUNTER — Other Ambulatory Visit: Payer: Self-pay | Admitting: General Practice

## 2023-08-18 NOTE — Progress Notes (Signed)
 Virtual Visit via Telephone Note  I connected with Kelsey Diaz on 08/18/23 at  3:00 PM EDT by telephone and verified that I am speaking with the correct person using two identifiers.  Location: Patient: Home Provider: [facility] control center   I discussed the limitations, risks, security and privacy concerns of performing an evaluation and management service by telephone and the availability of in person appointments. I also discussed with the patient that there may be a patient responsible charge related to this service. The patient expressed understanding and agreed to proceed.   History of Present Illness: I spoke with Kelsey Diaz via telephone.  She reports that her low back pain hip pain and osteoarthritic pain is stable and responding favorably to chronic opioid therapy.  The quality characteristic distribution remained stable with no recent changes.  She been taking her medications as prescribed and generally gets about 75% relief without side effect.  The same areas seem to hurt but the quality of the pain has been stable and responsive.  She is trying to stay active but has been somewhat subdued secondary to the recent increase in heat.  Additionally she reports that she is having more right lower extremity sciatica symptoms.  In the past she has had epidural steroid injections and her most recent one was back in 2020 for February where she got about 90% relief from her right lower extremity sciatica with an epidural injection secondary to its retractable nature.  This lasted for about 6 months.  She is reporting some recurrence of the same pain that is not responding to her current opioid therapy for her back and diffuse pain.  She would like to come in for an epidural at the next available time.  Review of systems: General: No fevers or chills Pulmonary: No shortness of breath or dyspnea Cardiac: No angina or palpitations or lightheadedness GI: No abdominal pain or  constipation Psych: No depression    Observations/Objective:  Current Outpatient Medications:    [START ON 09/22/2023] HYDROcodone -acetaminophen  (NORCO) 7.5-325 MG tablet, Take 1 tablet by mouth every 6 (six) hours as needed for moderate pain (pain score 4-6) or severe pain (pain score 7-10)., Disp: 120 tablet, Rfl: 0   albuterol  (ACCUNEB ) 0.63 MG/3ML nebulizer solution, Take 1 ampule by nebulization every 6 (six) hours as needed for wheezing or shortness of breath., Disp: , Rfl:    albuterol  (VENTOLIN  HFA) 108 (90 Base) MCG/ACT inhaler, Inhale 2 puffs into the lungs as needed for shortness of breath., Disp: , Rfl:    amiodarone  (PACERONE ) 100 MG tablet, Take 1 tablet (100 mg total) by mouth daily., Disp: 90 tablet, Rfl: 1   aspirin  81 MG chewable tablet, Chew 81 mg by mouth daily., Disp: , Rfl:    Calcium  Carbonate-Vitamin D  600-200 MG-UNIT TABS, Take 1 tablet by mouth daily. , Disp: , Rfl:    Carboxymethylcellulose Sodium (EYE DROPS OP), Place 2 drops into both eyes as needed (clear eyes)., Disp: , Rfl:    Clobetasol  Prop Emollient Base 0.05 % emollient cream, Apply 1 application  topically 2 (two) times daily as needed (skin irritation)., Disp: , Rfl:    CRANBERRY PO, Take 1 tablet by mouth daily., Disp: , Rfl:    DULoxetine  (CYMBALTA ) 60 MG capsule, Take 60 mg by mouth 2 (two) times daily., Disp: , Rfl:    etanercept (ENBREL) 50 MG/ML injection, Inject 50 mg into the skin every Wednesday., Disp: , Rfl:    fluticasone  (FLONASE ) 50 MCG/ACT nasal spray, Place  2 sprays into both nostrils at bedtime., Disp: , Rfl:    [START ON 08/23/2023] HYDROcodone -acetaminophen  (NORCO) 7.5-325 MG tablet, Take 1 tablet by mouth every 6 (six) hours as needed for moderate pain (pain score 4-6) or severe pain (pain score 7-10)., Disp: 120 tablet, Rfl: 0   ibuprofen (ADVIL) 200 MG tablet, Take 400 mg by mouth as needed for headache or moderate pain., Disp: , Rfl:    levothyroxine  (SYNTHROID ) 25 MCG tablet, Take 1  tablet (25 mcg total) by mouth daily., Disp: 90 tablet, Rfl: 0   magnesium  oxide (MAG-OX) 400 (240 Mg) MG tablet, Take 1 tablet (400 mg total) by mouth daily., Disp: 30 tablet, Rfl: 1   meloxicam  (MOBIC ) 15 MG tablet, TAKE 1 TABLET (15 MG TOTAL) BY MOUTH DAILY., Disp: 30 tablet, Rfl: 1   metoprolol  succinate (TOPROL  XL) 25 MG 24 hr tablet, Take 1 tablet (25 mg total) by mouth in the morning and at bedtime. (Patient taking differently: Take 25 mg by mouth daily.), Disp: 180 tablet, Rfl: 3   Multiple Vitamin (MULTI-VITAMIN PO), Take 1 tablet by mouth daily. , Disp: , Rfl:    Omega-3 Fatty Acids (FISH OIL PO), Take 2 capsules by mouth daily., Disp: , Rfl:    omeprazole (PRILOSEC) 40 MG capsule, Take 40 mg by mouth daily., Disp: , Rfl: 1   rosuvastatin  (CRESTOR ) 20 MG tablet, Take 20 mg by mouth every Monday., Disp: , Rfl:    Tiotropium Bromide-Olodaterol (STIOLTO RESPIMAT ) 2.5-2.5 MCG/ACT AERS, Inhale 1 puff into the lungs in the morning and at bedtime., Disp: 4 g, Rfl: 0   Past Medical History:  Diagnosis Date   Abnormal nuclear stress test 06/03/2017   Allergic rhinitis    Anxiety    Asthma    Bacteremia 04/05/2021   Bilateral hip pain 11/28/2018   Chest pain 06/03/2017   Colitis    COPD (chronic obstructive pulmonary disease) (HCC)    Dizzy 06/13/2012   E-coli UTI 04/04/2021   Educated about COVID-19 virus infection 07/02/2019   Fibromyalgia 05/16/2019   GERD (gastroesophageal reflux disease)    With esophageal strictures   Hand pain 11/28/2018   Hypertension    Hyperthyroidism    Hypomagnesemia 05/03/2022   Normocytic anemia 05/03/2022   OSA (obstructive sleep apnea) 02/09/2012   No mask.  Did not tolerate   Osteoarthritis    PNA (pneumonia) 09/13/2017   Rheumatoid arthritis (HCC) 10/03/2018   Sepsis due to Escherichia coli (E. coli) (HCC) 04/05/2021   SOB (shortness of breath) 07/02/2019   Thrombocytopenia (HCC) 05/03/2022   Assessment and Plan:  1. Lumbar spondylosis  with myelopathy   2. Bilateral hip pain   3. Chronic, continuous use of opioids   4. Rheumatoid arthritis involving multiple sites with positive rheumatoid factor (HCC)   5. Chronic pain syndrome   6. Fibromyalgia   7. Right sided sciatica   8. Pain in both hands   9. Facet arthritis of lumbosacral region   10. 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  Will also schedule her for an epidural injection in 2 months.  She is going to see how she is responding to stretching and conservative measures before she decides to proceed with the epidural but we will have this available for her if needed.  Continue with stretching strengthening exercises.  Continue  with current medication management with no other changes initiated.  I have reviewed the [place]  practitioner database information is also appropriate for refill.  Continue follow-up with her primary care physician for baseline medical care. Follow Up Instructions:    I discussed the assessment and treatment plan with the patient. The patient was provided an opportunity to ask questions and all were answered. The patient agreed with the plan and demonstrated an understanding of the instructions.   The patient was advised to call back or seek an in-person evaluation if the symptoms worsen or if the condition fails to improve as anticipated.  I provided 30 minutes of non-face-to-face time during this encounter.   Kelsey KANDICE Clause, MD

## 2023-08-21 DIAGNOSIS — G4733 Obstructive sleep apnea (adult) (pediatric): Secondary | ICD-10-CM | POA: Diagnosis not present

## 2023-09-09 ENCOUNTER — Other Ambulatory Visit: Payer: Self-pay | Admitting: General Practice

## 2023-09-19 ENCOUNTER — Other Ambulatory Visit: Payer: Self-pay | Admitting: General Practice

## 2023-10-11 ENCOUNTER — Other Ambulatory Visit: Payer: Self-pay | Admitting: General Practice

## 2023-10-11 DIAGNOSIS — E039 Hypothyroidism, unspecified: Secondary | ICD-10-CM

## 2023-10-11 NOTE — Telephone Encounter (Signed)
 Patient coming on for f/u on 10/14/23; okay to refill?

## 2023-10-14 ENCOUNTER — Encounter: Payer: Self-pay | Admitting: General Practice

## 2023-10-14 ENCOUNTER — Ambulatory Visit (INDEPENDENT_AMBULATORY_CARE_PROVIDER_SITE_OTHER): Admitting: General Practice

## 2023-10-14 VITALS — BP 110/80 | HR 80 | Temp 97.8°F | Ht 66.5 in | Wt 199.0 lb

## 2023-10-14 DIAGNOSIS — E119 Type 2 diabetes mellitus without complications: Secondary | ICD-10-CM | POA: Insufficient documentation

## 2023-10-14 DIAGNOSIS — J3489 Other specified disorders of nose and nasal sinuses: Secondary | ICD-10-CM

## 2023-10-14 DIAGNOSIS — G2581 Restless legs syndrome: Secondary | ICD-10-CM | POA: Diagnosis not present

## 2023-10-14 DIAGNOSIS — E039 Hypothyroidism, unspecified: Secondary | ICD-10-CM

## 2023-10-14 LAB — BASIC METABOLIC PANEL WITH GFR
BUN: 16 mg/dL (ref 6–23)
CO2: 27 meq/L (ref 19–32)
Calcium: 9.2 mg/dL (ref 8.4–10.5)
Chloride: 97 meq/L (ref 96–112)
Creatinine, Ser: 0.84 mg/dL (ref 0.40–1.20)
GFR: 66.41 mL/min (ref >60.00)
Glucose, Bld: 94 mg/dL (ref 70–99)
Potassium: 4.9 meq/L (ref 3.5–5.1)
Sodium: 133 meq/L — ABNORMAL LOW (ref 135–145)

## 2023-10-14 LAB — TSH: TSH: 5.54 u[IU]/mL — ABNORMAL HIGH (ref 0.35–5.50)

## 2023-10-14 MED ORDER — BLOOD GLUCOSE MONITORING SUPPL DEVI: 1.0000 | Freq: Three times a day (TID) | 0 refills | Status: AC

## 2023-10-14 MED ORDER — LANCETS MISC. MISC: 1.0000 | Freq: Three times a day (TID) | 0 refills | Status: AC

## 2023-10-14 MED ORDER — BLOOD GLUCOSE TEST VI STRP: 1.0000 | ORAL_STRIP | Freq: Three times a day (TID) | 0 refills | Status: AC

## 2023-10-14 MED ORDER — GABAPENTIN 100 MG PO CAPS: 100.0000 mg | ORAL_CAPSULE | Freq: Every day | ORAL | 3 refills | Status: DC

## 2023-10-14 MED ORDER — LANCET DEVICE MISC: 1.0000 | Freq: Three times a day (TID) | 0 refills | Status: DC

## 2023-10-14 NOTE — Addendum Note (Signed)
 Addended by: HOPE VEVA PARAS on: 10/14/2023 01:18 PM   Modules accepted: Orders

## 2023-10-14 NOTE — Progress Notes (Signed)
 Established Patient Office Visit  Subjective   Patient ID: Kelsey Diaz, female    DOB: 14-Oct-1945  Age: 78 y.o. MRN: [id number]  Chief Complaint  Patient presents with   Diabetes    Patient following up on DM   Sinus Problem    Patient states she has been having a lot of sinus issues for couple years not but getting worse.    restless leg    Patient states is getting worse. Patient uses polar frost currently with her compression stockings but starting to not help as much.     Diabetes Hypoglycemia symptoms include headaches. Pertinent negatives for hypoglycemia include no dizziness or nervousness/anxiousness. Pertinent negatives for diabetes include no chest pain and no polydipsia.  Sinus Problem Associated symptoms include headaches. Pertinent negatives include no chills or shortness of breath.    Kelsey Diaz is a 78 year old female with past medical history of HTN, COPD, OSA, RA, hypothyroidism, DM2 presents today for a follow up.   Discussed the use of AI scribe software for clinical note transcription with the patient, who gave verbal consent to proceed.  History of Present Illness She is accompanied by her son-in-law, who assists with her care.  She was previously prediabetic, but recent tests confirmed type 2 diabetes. She has not been consistently adhering to a diabetic diet, often consuming meals like spaghetti and sandwiches. She has not yet started on diabetes medication as her provider wanted to assess her ability to manage her condition through diet over the past three months. She is awaiting her A1c results to determine the next steps in her management. She does not currently have a glucometer at home.  She is being monitored for hypothyroidism. Her thyroid  levels were 10.56 on 07/14/23. She was started on Levothyroxine  25 mcg. She experiences symptoms consistent with hypothyroidism, such as fatigue and sluggishness.  She experiences worsening symptoms of  restless leg syndrome, which she describes as a need to move her legs due to discomfort. She uses polar frost and compression stockings, which provide some relief. She experiences numbness and tingling in her legs.  She has a history of sinus issues following nasal surgery many years ago. She experiences sinus headaches and pressure, which she suspects may be related to the previous surgery. She has been using Flonase , which helps to some extent, but the pressure returns. She would like ENT referral.     Patient Active Problem List   Diagnosis Date Noted   Type 2 diabetes mellitus without complication, without long-term current use of insulin (HCC) 10/14/2023   Hypothyroidism 10/14/2023   Spinal stenosis of lumbar region 08/17/2023   Annual visit for general adult medical examination with abnormal findings 07/14/2023   Prediabetes 07/14/2023   Dysuria 07/14/2023   Urine frequency 05/04/2023   Acute cough 05/04/2023   Bilateral leg pain 03/30/2023   Acute pain of left shoulder 03/15/2023   Insomnia 03/15/2023   Recurrent UTI 04/06/2021   Ventricular tachycardia (HCC) 12/11/2020   Fibromyalgia 05/16/2019   Rheumatoid arthritis (HCC) 10/03/2018   HTN (hypertension) 09/13/2017   OSA (obstructive sleep apnea) 02/09/2012   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   Past Medical History:  Diagnosis Date   Abnormal nuclear stress test 06/03/2017   Allergic rhinitis    Anxiety    Asthma    Bacteremia 04/05/2021   Bilateral hip pain 11/28/2018   Chest pain 06/03/2017   Colitis    COPD (chronic obstructive pulmonary disease) (HCC)    Dizzy  06/13/2012   E-coli UTI 04/04/2021   Educated about COVID-19 virus infection 07/02/2019   Fibromyalgia 05/16/2019   GERD (gastroesophageal reflux disease)    With esophageal strictures   Hand pain 11/28/2018   Hypertension    Hyperthyroidism    Hypomagnesemia 05/03/2022   Normocytic anemia 05/03/2022   OSA (obstructive sleep apnea) 02/09/2012   No  mask.  Did not tolerate   Osteoarthritis    PNA (pneumonia) 09/13/2017   Rheumatoid arthritis (HCC) 10/03/2018   Sepsis due to Escherichia coli (E. coli) (HCC) 04/05/2021   SOB (shortness of breath) 07/02/2019   Thrombocytopenia (HCC) 05/03/2022   Past Surgical History:  Procedure Laterality Date   BREAST BIOPSY     x2   CATARACT EXTRACTION W/PHACO Left 09/27/2018   Procedure: CATARACT EXTRACTION PHACO AND INTRAOCULAR LENS PLACEMENT (IOC) LEFT;  Surgeon: Jaye Fallow, MD;  Location: [facility];  Service: Ophthalmology;  Laterality: Left;   CATARACT EXTRACTION W/PHACO Right 11/29/2018   Procedure: CATARACT EXTRACTION PHACO AND INTRAOCULAR LENS PLACEMENT (IOC) RIGHT;  Surgeon: Jaye Fallow, MD;  Location: [facility];  Service: Ophthalmology;  Laterality: Right;  1:22 20.1% 16.64   CHOLECYSTECTOMY     2005   KNEE SURGERY Right 2013   two torn ligaments repaired.    LEFT HEART CATH AND CORONARY ANGIOGRAPHY N/A 06/08/2017   Procedure: LEFT HEART CATH AND CORONARY ANGIOGRAPHY;  Surgeon: Burnard Debby LABOR, MD;  Location: [facility];  Service: Cardiovascular;  Laterality: N/A;   LEFT HEART CATH AND CORONARY ANGIOGRAPHY N/A 12/12/2020   Procedure: LEFT HEART CATH AND CORONARY ANGIOGRAPHY;  Surgeon: Court Dorn PARAS, MD;  Location: [facility];  Service: Cardiovascular;  Laterality: N/A;   NASAL SINUS SURGERY     V TACH ABLATION N/A 12/16/2020   Procedure: V TACH ABLATION;  Surgeon: Cindie Ole DASEN, MD;  Location: [facility];  Service: Cardiovascular;  Laterality: N/A;   VAGINAL HYSTERECTOMY     Allergies  Allergen Reactions   Morphine Other (See Comments)    Stopped heart   Advair Diskus [Fluticasone -Salmeterol] Other (See Comments)    Sensitivity to smells   Ambien [Zolpidem Tartrate] Other (See Comments)    irritable   Amlodipine Besylate Other (See Comments)    Feels bad   Azithromycin  Other (See Comments)     Unknown   Bextra  [Valdecoxib] Other (See Comments)    Stomach issues   Bupropion Other (See Comments)    involuntary muscle movements   Captopril Other (See Comments)    Ache   Irbesartan  Other (See Comments)    dizzy   Latex Itching    Bandaids only   Lisinopril Cough   Pacerone  [Amiodarone ] Other (See Comments)    falls   Pulmicort [Budesonide] Other (See Comments)    Worse breathing, sensitivity to smells   Savella [Milnacipran Hcl] Other (See Comments)    Mental fog   Spiriva [Tiotropium Bromide Monohydrate] Other (See Comments)    Dry mouth   Suvorexant Other (See Comments)     not effective   Tiotropium Bromide Monohydrate Other (See Comments)     dry mouth         10/14/2023   12:41 PM 07/14/2023   11:44 AM 07/12/2023    2:05 PM  Depression screen PHQ 2/9  Decreased Interest 1 1 1   Down, Depressed, Hopeless 1 0 1  PHQ - 2 Score 2 1 2   Altered sleeping 3 3 0  Tired, decreased energy  3 3 0  Change in appetite 3 3 1   Feeling bad or failure about yourself  0 0 0  Trouble concentrating 0 2 0  Moving slowly or fidgety/restless 1 2 0  Suicidal thoughts 0 0 0  PHQ-9 Score 12 14 3   Difficult doing work/chores Not difficult at all Somewhat difficult Not difficult at all       10/14/2023   12:41 PM 07/14/2023   11:44 AM 03/30/2023    2:38 PM 03/15/2023    3:39 PM  GAD 7 : Generalized Anxiety Score  Nervous, Anxious, on Edge 1 1 1 1   Control/stop worrying 0 0 0 0  Worry too much - different things 1 2 1 1   Trouble relaxing 1 2 0 3  Restless 0 1 0 0  Easily annoyed or irritable 1 2 0 1  Afraid - awful might happen 0 0 0 0  Total GAD 7 Score 4 8 2 6   Anxiety Difficulty Not difficult at all Somewhat difficult Somewhat difficult Not difficult at all      Review of Systems  Constitutional:  Negative for chills and fever.  HENT:  Positive for sinus pain.   Respiratory:  Negative for shortness of breath.   Cardiovascular:  Negative for chest pain.  Gastrointestinal:  Negative for  abdominal pain, constipation, diarrhea, heartburn, nausea and vomiting.  Genitourinary:  Negative for dysuria, frequency and urgency.  Neurological:  Positive for tingling and headaches. Negative for dizziness.       Restless legs.  Endo/Heme/Allergies:  Negative for polydipsia.  Psychiatric/Behavioral:  Negative for depression and suicidal ideas. The patient is not nervous/anxious.       Objective:     BP 110/80   Pulse 80   Temp 97.8 F (36.6 C) (Oral)   Ht 5' 6.5 (1.689 m)   Wt 199 lb (90.3 kg)   SpO2 97%   BMI 31.64 kg/m  BP Readings from Last 3 Encounters:  10/14/23 110/80  07/14/23 120/68  07/12/23 136/74   Wt Readings from Last 3 Encounters:  10/14/23 199 lb (90.3 kg)  07/14/23 205 lb (93 kg)  07/12/23 204 lb (92.5 kg)      Physical Exam Vitals and nursing note reviewed.  Constitutional:      Appearance: Normal appearance.  Cardiovascular:     Rate and Rhythm: Normal rate and regular rhythm.     Pulses: Normal pulses.     Heart sounds: Normal heart sounds.  Pulmonary:     Effort: Pulmonary effort is normal.     Breath sounds: Normal breath sounds.  Musculoskeletal:        General: No swelling or tenderness.  Skin:    General: Skin is warm.  Neurological:     Mental Status: She is alert and oriented to person, place, and time.  Psychiatric:        Mood and Affect: Mood normal.        Behavior: Behavior normal.        Thought Content: Thought content normal.        Judgment: Judgment normal.      No results found for any visits on 10/14/23.    The 10-year ASCVD risk score (Arnett DK, et al., 2019) is: 39.2%    Assessment & Plan:  Type 2 diabetes mellitus without complication, without long-term current use of insulin (HCC) -     Hemoglobin A1c -     Basic metabolic panel with GFR -  Blood Glucose Monitoring Suppl; 1 each by Does not apply route in the morning, at noon, and at bedtime. May substitute to any manufacturer covered by patient's  insurance.  Dispense: 1 each; Refill: 0 -     Blood Glucose Test; 1 each by In Vitro route in the morning, at noon, and at bedtime. May substitute to any manufacturer covered by patient's insurance.  Dispense: 90 each; Refill: 0 -     Lancet Device; 1 each by Does not apply route in the morning, at noon, and at bedtime. May substitute to any manufacturer covered by patient's insurance.  Dispense: 1 each; Refill: 0 -     Lancets Misc.; 1 each by Does not apply route in the morning, at noon, and at bedtime. May substitute to any manufacturer covered by patient's insurance.  Dispense: 100 each; Refill: 0  Hypothyroidism, unspecified type -     TSH  Sinus pressure -     Ambulatory referral to ENT  Restless leg syndrome -     Gabapentin ; Take 1 capsule (100 mg total) by mouth at bedtime.  Dispense: 30 capsule; Refill: 3    Assessment and Plan Assessment & Plan Type 2 diabetes mellitus with polyneuropathy Progression from prediabetes to type 2 diabetes with non-adherence to diet. Symptoms indicate diabetic polyneuropathy. - Order A1c test. - Order glucometer kit with strips and lancets. - Instruct to monitor blood glucose at home. - Provide dietary recommendations. - Consider starting diabetes medication if A1c is 7 or higher.  Hypothyroidism Previously abnormal thyroid  levels, currently on medication. Nodule transitioned from overactive to underactive. - Order thyroid  function tests.  Restless legs syndrome Worsening symptoms with leg movement and pain. Gabapentin  and duloxetine  may increase drowsiness. - Start gabapentin  100 mg at bedtime. - Follow up in two weeks to assess response to gabapentin .  Chronic sinus symptoms with history of nasal surgery Sinus pressure and headaches, possibly seasonal. Flonase  provides some relief but symptoms persist. - Refer to ENT specialist in [place]. - Continue using Flonase .   Return in about 2 weeks (around 10/28/2023) for restless legs.     Carrol Aurora, NP

## 2023-10-14 NOTE — Patient Instructions (Addendum)
 Stop by the lab prior to leaving today. I will notify you of your results once received.   Start Gabapentin  100 mg at bedtime for restless legs.   I will be in touch regarding your A1c and thyroid  levels. I will more than likely adjust your medication.   Diabetes: You have type 2 diabetes which means your blood sugar is too high. I recommend we treat this with a medication called Metformin. Metformin is a pill that will help your pancreas to work against high blood sugar levels in your body.   Improvement in your diet and regular exercise will help to lower these levels. Reduce fast food, fried food, processed carbohydrates (chips, cookies, etc), sugary drinks. Increase consumption of fresh fruits and vegetables, whole grains, water. Exercise will also lower these levels.  Start checking your blood sugar levels.  Appropriate times to check your blood sugar levels are:  -Before any meal (breakfast, lunch, dinner) -Two hours after any meal (breakfast, lunch, dinner) -Bedtime  Record your readings and notify me if you continue to consistently run at or above 150.   Schedule eye exam.   You will either be contacted via phone regarding your referral to ENT, or you may receive a letter on your MyChart portal from our referral team with instructions for scheduling an appointment. Please let us  know if you have not been contacted by anyone within two weeks.  F/u in two weeks.   It was a pleasure to see you today!

## 2023-10-15 ENCOUNTER — Other Ambulatory Visit: Payer: Self-pay | Admitting: General Practice

## 2023-10-15 ENCOUNTER — Ambulatory Visit: Payer: Self-pay | Admitting: General Practice

## 2023-10-15 DIAGNOSIS — E039 Hypothyroidism, unspecified: Secondary | ICD-10-CM

## 2023-10-15 LAB — HEMOGLOBIN A1C
Hgb A1c MFr Bld: 6.4 % — ABNORMAL HIGH (ref <5.7)
Mean Plasma Glucose: 137 mg/dL
eAG (mmol/L): 7.6 mmol/L

## 2023-10-15 MED ORDER — LEVOTHYROXINE SODIUM 50 MCG PO TABS: 50.0000 ug | ORAL_TABLET | Freq: Every day | ORAL | 0 refills | Status: DC

## 2023-10-19 ENCOUNTER — Ambulatory Visit: Attending: Anesthesiology | Admitting: Anesthesiology

## 2023-10-19 ENCOUNTER — Encounter: Payer: Self-pay | Admitting: Anesthesiology

## 2023-10-19 DIAGNOSIS — M79642 Pain in left hand: Secondary | ICD-10-CM

## 2023-10-19 DIAGNOSIS — F1190 Opioid use, unspecified, uncomplicated: Secondary | ICD-10-CM

## 2023-10-19 DIAGNOSIS — M797 Fibromyalgia: Secondary | ICD-10-CM

## 2023-10-19 DIAGNOSIS — M4716 Other spondylosis with myelopathy, lumbar region: Secondary | ICD-10-CM | POA: Diagnosis not present

## 2023-10-19 DIAGNOSIS — M48062 Spinal stenosis, lumbar region with neurogenic claudication: Secondary | ICD-10-CM

## 2023-10-19 DIAGNOSIS — M47817 Spondylosis without myelopathy or radiculopathy, lumbosacral region: Secondary | ICD-10-CM

## 2023-10-19 DIAGNOSIS — M5431 Sciatica, right side: Secondary | ICD-10-CM

## 2023-10-19 DIAGNOSIS — M0579 Rheumatoid arthritis with rheumatoid factor of multiple sites without organ or systems involvement: Secondary | ICD-10-CM | POA: Diagnosis not present

## 2023-10-19 DIAGNOSIS — M25552 Pain in left hip: Secondary | ICD-10-CM

## 2023-10-19 DIAGNOSIS — G894 Chronic pain syndrome: Secondary | ICD-10-CM

## 2023-10-19 DIAGNOSIS — M25551 Pain in right hip: Secondary | ICD-10-CM

## 2023-10-19 DIAGNOSIS — M79641 Pain in right hand: Secondary | ICD-10-CM | POA: Diagnosis not present

## 2023-10-19 MED ORDER — HYDROCODONE-ACETAMINOPHEN 7.5-325 MG PO TABS: 1.0000 | ORAL_TABLET | Freq: Four times a day (QID) | ORAL | 0 refills | Status: DC | PRN

## 2023-10-19 MED ORDER — HYDROCODONE-ACETAMINOPHEN 7.5-325 MG PO TABS: 1.0000 | ORAL_TABLET | Freq: Four times a day (QID) | ORAL | 0 refills | Status: AC | PRN

## 2023-10-19 NOTE — Progress Notes (Unsigned)
 Virtual Visit via Telephone Note  I connected with Kelsey Diaz on 10/19/23 at  2:20 PM EDT by telephone and verified that I am speaking with the correct person using two identifiers.  Location: Patient: Home Provider: [facility]   I discussed the limitations, risks, security and privacy concerns of performing an evaluation and management service by telephone and the availability of in person appointments. I also discussed with the patient that there may be a patient responsible charge related to this service. The patient expressed understanding and agreed to proceed.   History of Present Illness: I spoke to Kelsey Diaz via telephone as we are unable link for the video portion of the conference.  She reports that she has been doing well.  No change in the quality characteristic or distribution of her low back pain or neck pain is noted.  She still takes her medications at the 7.5 mg hydrocodone  strength 4 times a day and this continues to give her good relief and support.  No side effects to the medicines are noted.  Unfortunately she has failed more conservative therapy but is able to be more active and sleep better with this medication management.  She is trying to do some outdoor activities but has had problems with mosquitoes in her area unfortunately.  Hopefully she will get a break here with the cooler weather she notes.  She is trying to do some walking stretching and gardening type things.  No change in lower extremity strength function bowel or bladder function is noted at this time.  Review of systems: General: No fevers or chills Pulmonary: No shortness of breath or dyspnea Cardiac: No angina or palpitations or lightheadedness GI: No abdominal pain or constipation Psych: No depression    Observations/Objective:  Current Outpatient Medications:    [START ON 11/21/2023] HYDROcodone -acetaminophen  (NORCO) 7.5-325 MG tablet, Take 1 tablet by mouth every 6 (six) hours as  needed for moderate pain (pain score 4-6) or severe pain (pain score 7-10)., Disp: 120 tablet, Rfl: 0   albuterol  (ACCUNEB ) 0.63 MG/3ML nebulizer solution, Take 1 ampule by nebulization every 6 (six) hours as needed for wheezing or shortness of breath., Disp: , Rfl:    albuterol  (VENTOLIN  HFA) 108 (90 Base) MCG/ACT inhaler, INHALE 2 PUFFS BY MOUTH EVERY 4 HRS AS NEEDED, Disp: 18 each, Rfl: 1   amiodarone  (PACERONE ) 100 MG tablet, Take 1 tablet (100 mg total) by mouth daily., Disp: 90 tablet, Rfl: 1   aspirin  81 MG chewable tablet, Chew 81 mg by mouth daily., Disp: , Rfl:    Blood Glucose Monitoring Suppl DEVI, 1 each by Does not apply route in the morning, at noon, and at bedtime. May substitute to any manufacturer covered by patient's insurance., Disp: 1 each, Rfl: 0   Calcium  Carbonate-Vitamin D  600-200 MG-UNIT TABS, Take 1 tablet by mouth daily. , Disp: , Rfl:    Carboxymethylcellulose Sodium (EYE DROPS OP), Place 2 drops into both eyes as needed (clear eyes)., Disp: , Rfl:    Clobetasol  Prop Emollient Base 0.05 % emollient cream, Apply 1 application  topically 2 (two) times daily as needed (skin irritation)., Disp: , Rfl:    clotrimazole-betamethasone  (LOTRISONE) cream, APPLY 1 APPLICATION TOPICALLY TWICE A DAY FOR 5 DAYS, Disp: 15 g, Rfl: 1   CRANBERRY PO, Take 1 tablet by mouth daily., Disp: , Rfl:    DULoxetine  (CYMBALTA ) 60 MG capsule, Take 60 mg by mouth 2 (two) times daily., Disp: , Rfl:    etanercept (  ENBREL) 50 MG/ML injection, Inject 50 mg into the skin every Wednesday., Disp: , Rfl:    fluticasone  (FLONASE ) 50 MCG/ACT nasal spray, Place 2 sprays into both nostrils at bedtime., Disp: , Rfl:    gabapentin  (NEURONTIN ) 100 MG capsule, Take 1 capsule (100 mg total) by mouth at bedtime., Disp: 30 capsule, Rfl: 3   Glucose Blood (BLOOD GLUCOSE TEST STRIPS) STRP, 1 each by In Vitro route in the morning, at noon, and at bedtime. May substitute to any manufacturer covered by patient's insurance.,  Disp: 90 each, Rfl: 0   [START ON 10/22/2023] HYDROcodone -acetaminophen  (NORCO) 7.5-325 MG tablet, Take 1 tablet by mouth every 6 (six) hours as needed for moderate pain (pain score 4-6) or severe pain (pain score 7-10)., Disp: 120 tablet, Rfl: 0   ibuprofen (ADVIL) 200 MG tablet, Take 400 mg by mouth as needed for headache or moderate pain., Disp: , Rfl:    Lancet Device MISC, 1 each by Does not apply route in the morning, at noon, and at bedtime. May substitute to any manufacturer covered by patient's insurance., Disp: 1 each, Rfl: 0   Lancets Misc. MISC, 1 each by Does not apply route in the morning, at noon, and at bedtime. May substitute to any manufacturer covered by patient's insurance., Disp: 100 each, Rfl: 0   levothyroxine  (SYNTHROID ) 50 MCG tablet, Take 1 tablet (50 mcg total) by mouth daily., Disp: 90 tablet, Rfl: 0   magnesium  oxide (MAG-OX) 400 (240 Mg) MG tablet, Take 1 tablet (400 mg total) by mouth daily., Disp: 30 tablet, Rfl: 1   meloxicam  (MOBIC ) 15 MG tablet, TAKE 1 TABLET (15 MG TOTAL) BY MOUTH DAILY., Disp: 30 tablet, Rfl: 1   metoprolol  succinate (TOPROL  XL) 25 MG 24 hr tablet, Take 1 tablet (25 mg total) by mouth in the morning and at bedtime. (Patient taking differently: Take 25 mg by mouth daily.), Disp: 180 tablet, Rfl: 3   Multiple Vitamin (MULTI-VITAMIN PO), Take 1 tablet by mouth daily. , Disp: , Rfl:    Omega-3 Fatty Acids (FISH OIL PO), Take 2 capsules by mouth daily., Disp: , Rfl:    omeprazole (PRILOSEC) 40 MG capsule, TAKE 1 CAPSULE BY MOUTH EVERY DAY, Disp: 90 capsule, Rfl: 0   rosuvastatin  (CRESTOR ) 20 MG tablet, Take 20 mg by mouth every Monday., Disp: , Rfl:    Tiotropium Bromide-Olodaterol (STIOLTO RESPIMAT ) 2.5-2.5 MCG/ACT AERS, Inhale 1 puff into the lungs in the morning and at bedtime., Disp: 4 g, Rfl: 0   Past Medical History:  Diagnosis Date   Abnormal nuclear stress test 06/03/2017   Allergic rhinitis    Anxiety    Asthma    Bacteremia 04/05/2021    Bilateral hip pain 11/28/2018   Chest pain 06/03/2017   Colitis    COPD (chronic obstructive pulmonary disease) (HCC)    Dizzy 06/13/2012   E-coli UTI 04/04/2021   Educated about COVID-19 virus infection 07/02/2019   Fibromyalgia 05/16/2019   GERD (gastroesophageal reflux disease)    With esophageal strictures   Hand pain 11/28/2018   Hypertension    Hyperthyroidism    Hypomagnesemia 05/03/2022   Normocytic anemia 05/03/2022   OSA (obstructive sleep apnea) 02/09/2012   No mask.  Did not tolerate   Osteoarthritis    PNA (pneumonia) 09/13/2017   Rheumatoid arthritis (HCC) 10/03/2018   Sepsis due to Escherichia coli (E. coli) (HCC) 04/05/2021   SOB (shortness of breath) 07/02/2019   Thrombocytopenia (HCC) 05/03/2022   Assessment and Plan: 1. Lumbar  spondylosis with myelopathy   2. Bilateral hip pain   3. Chronic, continuous use of opioids   4. Rheumatoid arthritis involving multiple sites with positive rheumatoid factor (HCC)   5. Chronic pain syndrome   6. Fibromyalgia   7. Right sided sciatica   8. Pain in both hands   9. Facet arthritis of lumbosacral region   10. Spinal stenosis of lumbar region with neurogenic claudication    Based on our conversation I gets appropriate refill her medicines for the next 2 months.  This to be dated for August 29 and September 28.  Continue with stretching strengthening exercises and core strengthening as reviewed.  Continue with her current medication management.  I have reviewed the [place]  practitioner database information and its appropriate for refill.  Continue follow-up with her primary care physicians as scheduled return in 2 months.  Follow Up Instructions:    I discussed the assessment and treatment plan with the patient. The patient was provided an opportunity to ask questions and all were answered. The patient agreed with the plan and demonstrated an understanding of the instructions.   The patient was advised to call  back or seek an in-person evaluation if the symptoms worsen or if the condition fails to improve as anticipated.  I provided 30 minutes of non-face-to-face time during this encounter.   Lynwood KANDICE Clause, MD

## 2023-10-29 ENCOUNTER — Ambulatory Visit: Admitting: General Practice

## 2023-10-29 ENCOUNTER — Encounter: Payer: Self-pay | Admitting: General Practice

## 2023-10-29 VITALS — BP 116/64 | HR 60 | Temp 98.2°F | Ht 66.5 in | Wt 197.8 lb

## 2023-10-29 DIAGNOSIS — R001 Bradycardia, unspecified: Secondary | ICD-10-CM | POA: Diagnosis not present

## 2023-10-29 DIAGNOSIS — G2581 Restless legs syndrome: Secondary | ICD-10-CM

## 2023-10-29 DIAGNOSIS — K219 Gastro-esophageal reflux disease without esophagitis: Secondary | ICD-10-CM

## 2023-10-29 DIAGNOSIS — Z23 Encounter for immunization: Secondary | ICD-10-CM | POA: Diagnosis not present

## 2023-10-29 DIAGNOSIS — L03313 Cellulitis of chest wall: Secondary | ICD-10-CM | POA: Diagnosis not present

## 2023-10-29 MED ORDER — CEPHALEXIN 500 MG PO CAPS: 1000.0000 mg | ORAL_CAPSULE | Freq: Two times a day (BID) | ORAL | 0 refills | Status: AC

## 2023-10-29 MED ORDER — PANTOPRAZOLE SODIUM 20 MG PO TBEC: 20.0000 mg | DELAYED_RELEASE_TABLET | Freq: Every day | ORAL | 2 refills | Status: DC

## 2023-10-29 NOTE — Patient Instructions (Addendum)
 Discontinue Omeprazole 40 mg once daily.   Start pantoprazole  20 mg once daily for acid reflux/choking sensation. Let me know if this is not helpful. I will order the swallowing study.  Schedule follow up with cardiology sooner than later to discuss episode regarding low heart rate.   Start Keflex  1000 mg twice daily for 7 days for the skin infection.  Call and schedule [facility] for full skin evaluation.  Continue gabapentin  100 mg once daily at bedtime for restless leg.   Keep your follow up in November.   It was a pleasure to see you today!

## 2023-10-29 NOTE — Progress Notes (Signed)
 Established Patient Office Visit  Subjective   Patient ID: Kelsey Diaz, female    DOB: 11-25-1945  Age: 78 y.o. MRN: [id number]  Chief Complaint  Patient presents with   restless leg    Patient here today to follow up; currently taking gabapentin  100 mg. Patient states her restless leg is better but still gives her a fit some nights.     HPI  Discussed the use of AI scribe software for clinical note transcription with the patient, who gave verbal consent to proceed.  History of Present Illness Kelsey Diaz is a 78 year old female with past medical history of HTN, ventricular tachycardia, COPD, OSA, DM2, RA, restless leg syndrome presents today for follow-up of restless leg symptoms and other concerns. She is accompanied by her daughter, Kelsey Diaz.  She experiences restless leg syndrome primarily at night. She reports that since starting gabapentin  100 mg, her restless leg symptoms have improved, with only one bothersome night in the past two weeks, requiring one application of topical rub, compared to previous instances needing three or four applications.  She has a history of bladder prolapse, which she describes as 'fell down and it gets over to the side.' This condition occasionally bothers her, especially when it 'sticks in one place.' She has previously undergone surgery for this issue.  She experiences a choking sensation, primarily with liquids, which has become more frequent. She takes omeprazole once daily in the morning. The sensation occurs mostly after breakfast or lunch, and occasionally with her own saliva. Usually only with liquids.  On 10/27/23, She had an episode where her heart rate dropped to 47, leading to confusion. Emergency medical services were called, but she refused hospital transport. Her heart rate returned to the 50s, and her blood pressure remained stable. She has a history of ventricular tachycardia and is on amiodarone  and metoprolol . She is followed by  cardiology and was last seen in August, 2024.  Skin Lesion: She has a habit of scratching bumps on her skin, which has led to an infection in one area. It is red and has clear discharge. She has been applying peroxide to the area. It is not painful but is red and hot to touch.   Patient Active Problem List   Diagnosis Date Noted   Need for influenza vaccination 10/29/2023   Type 2 diabetes mellitus without complication, without long-term current use of insulin (HCC) 10/14/2023   Hypothyroidism 10/14/2023   Spinal stenosis of lumbar region 08/17/2023   Annual visit for general adult medical examination with abnormal findings 07/14/2023   Prediabetes 07/14/2023   Dysuria 07/14/2023   Urine frequency 05/04/2023   Acute cough 05/04/2023   Bilateral leg pain 03/30/2023   Acute pain of left shoulder 03/15/2023   Insomnia 03/15/2023   Recurrent UTI 04/06/2021   Ventricular tachycardia (HCC) 12/11/2020   Fibromyalgia 05/16/2019   Rheumatoid arthritis (HCC) 10/03/2018   HTN (hypertension) 09/13/2017   OSA (obstructive sleep apnea) 02/09/2012   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   Past Medical History:  Diagnosis Date   Abnormal nuclear stress test 06/03/2017   Allergic rhinitis    Anxiety    Asthma    Bacteremia 04/05/2021   Bilateral hip pain 11/28/2018   Chest pain 06/03/2017   Colitis    COPD (chronic obstructive pulmonary disease) (HCC)    Dizzy 06/13/2012   E-coli UTI 04/04/2021   Educated about COVID-19 virus infection 07/02/2019   Fibromyalgia 05/16/2019   GERD (gastroesophageal reflux disease)  With esophageal strictures   Hand pain 11/28/2018   Hypertension    Hyperthyroidism    Hypomagnesemia 05/03/2022   Normocytic anemia 05/03/2022   OSA (obstructive sleep apnea) 02/09/2012   No mask.  Did not tolerate   Osteoarthritis    PNA (pneumonia) 09/13/2017   Rheumatoid arthritis (HCC) 10/03/2018   Sepsis due to Escherichia coli (E. coli) (HCC) 04/05/2021   SOB  (shortness of breath) 07/02/2019   Thrombocytopenia (HCC) 05/03/2022   Past Surgical History:  Procedure Laterality Date   BREAST BIOPSY     x2   CATARACT EXTRACTION W/PHACO Left 09/27/2018   Procedure: CATARACT EXTRACTION PHACO AND INTRAOCULAR LENS PLACEMENT (IOC) LEFT;  Surgeon: Jaye Fallow, MD;  Location: [facility];  Service: Ophthalmology;  Laterality: Left;   CATARACT EXTRACTION W/PHACO Right 11/29/2018   Procedure: CATARACT EXTRACTION PHACO AND INTRAOCULAR LENS PLACEMENT (IOC) RIGHT;  Surgeon: Jaye Fallow, MD;  Location: [facility];  Service: Ophthalmology;  Laterality: Right;  1:22 20.1% 16.64   CHOLECYSTECTOMY     2005   KNEE SURGERY Right 2013   two torn ligaments repaired.    LEFT HEART CATH AND CORONARY ANGIOGRAPHY N/A 06/08/2017   Procedure: LEFT HEART CATH AND CORONARY ANGIOGRAPHY;  Surgeon: Burnard Debby LABOR, MD;  Location: [facility];  Service: Cardiovascular;  Laterality: N/A;   LEFT HEART CATH AND CORONARY ANGIOGRAPHY N/A 12/12/2020   Procedure: LEFT HEART CATH AND CORONARY ANGIOGRAPHY;  Surgeon: Court Dorn PARAS, MD;  Location: [facility];  Service: Cardiovascular;  Laterality: N/A;   NASAL SINUS SURGERY     V TACH ABLATION N/A 12/16/2020   Procedure: V TACH ABLATION;  Surgeon: Cindie Ole DASEN, MD;  Location: [facility];  Service: Cardiovascular;  Laterality: N/A;   VAGINAL HYSTERECTOMY     Allergies  Allergen Reactions   Morphine Other (See Comments)    Stopped heart   Advair Diskus [Fluticasone -Salmeterol] Other (See Comments)    Sensitivity to smells   Ambien [Zolpidem Tartrate] Other (See Comments)    irritable   Amlodipine Besylate Other (See Comments)    Feels bad   Azithromycin  Other (See Comments)     Unknown   Bextra [Valdecoxib] Other (See Comments)    Stomach issues   Bupropion Other (See Comments)    involuntary muscle movements   Captopril Other (See Comments)    Ache   Irbesartan  Other  (See Comments)    dizzy   Latex Itching    Bandaids only   Lisinopril Cough   Pacerone  [Amiodarone ] Other (See Comments)    falls   Pulmicort [Budesonide] Other (See Comments)    Worse breathing, sensitivity to smells   Savella [Milnacipran Hcl] Other (See Comments)    Mental fog   Spiriva [Tiotropium Bromide Monohydrate] Other (See Comments)    Dry mouth   Suvorexant Other (See Comments)     not effective   Tiotropium Bromide Monohydrate Other (See Comments)     dry mouth         10/29/2023    3:36 PM 10/14/2023   12:41 PM 07/14/2023   11:44 AM  Depression screen PHQ 2/9  Decreased Interest 0 1 1  Down, Depressed, Hopeless 2 1 0  PHQ - 2 Score 2 2 1   Altered sleeping 3 3 3   Tired, decreased energy 3 3 3   Change in appetite 3 3 3   Feeling bad or failure about yourself  0 0 0  Trouble concentrating 0 0 2  Moving slowly or fidgety/restless 3 1 2   Suicidal thoughts 0 0 0  PHQ-9 Score 14 12 14   Difficult doing work/chores Somewhat difficult Not difficult at all Somewhat difficult       10/29/2023    3:36 PM 10/14/2023   12:41 PM 07/14/2023   11:44 AM 03/30/2023    2:38 PM  GAD 7 : Generalized Anxiety Score  Nervous, Anxious, on Edge 0 1 1 1   Control/stop worrying 2 0 0 0  Worry too much - different things 2 1 2 1   Trouble relaxing 3 1 2  0  Restless 0 0 1 0  Easily annoyed or irritable 3 1 2  0  Afraid - awful might happen 0 0 0 0  Total GAD 7 Score 10 4 8 2   Anxiety Difficulty Not difficult at all Not difficult at all Somewhat difficult Somewhat difficult      Review of Systems  Constitutional:  Negative for chills and fever.  Respiratory:  Negative for shortness of breath.   Cardiovascular:  Negative for chest pain.  Gastrointestinal:  Negative for abdominal pain, constipation, diarrhea, heartburn, nausea and vomiting.  Genitourinary:  Negative for dysuria, frequency and urgency.  Neurological:  Negative for dizziness and headaches.  Endo/Heme/Allergies:  Negative  for polydipsia.  Psychiatric/Behavioral:  Negative for depression and suicidal ideas. The patient is not nervous/anxious.       Objective:     BP 116/64   Pulse 60   Temp 98.2 F (36.8 C) (Oral)   Ht 5' 6.5 (1.689 m)   Wt 197 lb 12.8 oz (89.7 kg)   SpO2 96%   BMI 31.45 kg/m  BP Readings from Last 3 Encounters:  10/29/23 116/64  10/14/23 110/80  07/14/23 120/68   Wt Readings from Last 3 Encounters:  10/29/23 197 lb 12.8 oz (89.7 kg)  10/14/23 199 lb (90.3 kg)  07/14/23 205 lb (93 kg)      Physical Exam Vitals and nursing note reviewed.  Constitutional:      Appearance: Normal appearance.  Cardiovascular:     Rate and Rhythm: Normal rate and regular rhythm.     Pulses: Normal pulses.     Heart sounds: Normal heart sounds.  Pulmonary:     Effort: Pulmonary effort is normal.     Breath sounds: Normal breath sounds.  Neurological:     Mental Status: She is alert and oriented to person, place, and time.  Psychiatric:        Mood and Affect: Mood normal.        Behavior: Behavior normal.        Thought Content: Thought content normal.        Judgment: Judgment normal.      No results found for any visits on 10/29/23.     The 10-year ASCVD risk score (Arnett DK, et al., 2019) is: 42.5%    Assessment & Plan:  Gastroesophageal reflux disease, unspecified whether esophagitis present -     Pantoprazole  Sodium; Take 1 tablet (20 mg total) by mouth daily.  Dispense: 30 tablet; Refill: 2  Restless leg syndrome  Need for influenza vaccination -     Flu vaccine HIGH DOSE PF(Fluzone Trivalent)  Cellulitis of chest wall -     Cephalexin ; Take 2 capsules (1,000 mg total) by mouth 2 (two) times daily for 7 days.  Dispense: 28 capsule; Refill: 0  Bradycardia    Assessment and Plan Assessment & Plan Restless legs syndrome Symptoms improved with gabapentin  100 mg at  bedtime, effective management indicated by only one symptomatic night in two weeks. - Continue  gabapentin  100 mg oral at bedtime.  Gastroesophageal reflux disease with choking sensation Choking sensation with liquids, possibly due to uncontrolled reflux despite omeprazole. Plan to switch to pantoprazole  to assess improvement. - Discontinue omeprazole. - Start pantoprazole  20 mg oral daily. - Order swallow study if no improvement with pantoprazole . - Follow up sooner if symptoms persist.  Cellulitis of chest wall Infected skin lesion likely from picking, requires antibiotics. Dermatology referral for full skin evaluation and cancer screening advised. - Prescribe Keflex  (cephalexin ) 500 mg, take two tablets in the morning and two at bedtime for 7 days. - Refer to dermatology for full skin evaluation and skin cancer screening.  Bradycardia with confusion Recent confusion with heart rate at 47 bpm - Advise to seek emergency care if similar episodes occur. - Schedule follow-up with cardiology to evaluate medication regimen and recent episode.    Return if symptoms worsen or fail to improve.    Carrol Aurora, NP

## 2023-11-04 ENCOUNTER — Other Ambulatory Visit: Payer: Self-pay | Admitting: Cardiology

## 2023-11-08 ENCOUNTER — Other Ambulatory Visit: Payer: Self-pay | Admitting: General Practice

## 2023-11-08 NOTE — Progress Notes (Unsigned)
 Electrophysiology Office Note:   Date:  11/09/2023  ID:  Kelsey Diaz, DOB June 17, 1945, MRN [id number]  Primary Cardiologist: Lynwood Schilling, MD Primary Heart Failure: None Electrophysiologist: OLE ONEIDA HOLTS, MD      History of Present Illness:   Kelsey Diaz is a 78 y.o. female with h/o VT, NSVT, bifascicular block, HTN, COPD, OSA, RLS, DM II & RA seen today for routine electrophysiology followup.   Pt called EMS 10/27/23 for HR of drop to 47 bpm that lead to confusion. She refused transport after EMS arrival.  Her BP was stable and HR returned to the 50's. There are no EMS documentation to support visit.  She was not feeling particularly different but her family was worried about her HR  Since last being seen in our clinic the patient reports she feels out of sorts at times and fatigued. She attributes this to getting older.  She did note some improvement in strength after PT and with the reduction of her Toprol  / amiodarone  at last visit. She reports feeling weak on physical exertion but now has a rollator.  Her son-in-law has to help get her into the truck due to the high step. No syncopal episodes. Denies palpitations, elevated HR's.  She denies chest pain, palpitations, dyspnea, PND, orthopnea, nausea, vomiting, dizziness, syncope, edema, weight gain, or early satiety.   Review of systems complete and found to be negative unless listed in HPI.   EP Information / Studies Reviewed:    EKG is ordered today. Personal review as below.  EKG Interpretation Date/Time:  Tuesday November 09 2023 10:26:40 EDT Ventricular Rate:  50 PR Interval:  192 QRS Duration:  134 QT Interval:  478 QTC Calculation: 435 R Axis:   -61  Text Interpretation: Sinus bradycardia with sinus arrhythmia Right bundle branch block Left anterior fascicular block Bifascicular block Confirmed by Aniceto Jarvis ([id number]) on 11/09/2023 10:47:38 AM    Arrhythmia / AAD / Pertinent EP Studies VT / NSVT  EPS  11/2020 > unable to induce VT Amiodarone  > initiated in 04/2022 during admit for dizziness.  Had tremulousness and trouble thinking that did not change after stopping it and it was restarted. Reduced to 100 mg 06/2023.   Risk Assessment/Calculations:              Physical Exam:   VS:  BP (!) 100/56   Pulse (!) 50   Ht 5' 7 (1.702 m)   Wt 197 lb (89.4 kg)   SpO2 91%   BMI 30.85 kg/m    Wt Readings from Last 3 Encounters:  11/09/23 197 lb (89.4 kg)  10/29/23 197 lb 12.8 oz (89.7 kg)  10/14/23 199 lb (90.3 kg)     GEN: elderly, well nourished, well developed in no acute distress NECK: No JVD; No carotid bruits CARDIAC: Regular rate and rhythm, no murmurs, rubs, gallops RESPIRATORY:  Clear to auscultation without rales, wheezing or rhonchi  ABDOMEN: Soft, non-tender, non-distended EXTREMITIES:  trace BLE edema; No deformity   ASSESSMENT AND PLAN:    VT / NSVT  Palpitations  High Risk Medication Monitoring: Amiodarone   Bifascicular Block > RBBB, LAFB -EKG with NSR, stable intervals  -no symptom burden  -continue amiodarone  100 mg daily  -reduce Toprol  to 12.5 mg BID with bradycardia > both amio / metop reduced at last visit to see if it helped with gait stability, was rec for PT > some  -LFT's wnl in 06/2023, TSH 09/2023 reviewed, down to 5.54 from 10.56 -hold repeat  LFT's / TSH given PCP labs, follow up at next Q6 month visit -discussed bifascicular block with patient and son-in-law and what to be aware of for progression of conduction system disease   Follow up with EP APP in 6 months  Signed, Daphne Barrack, NP-C, AGACNP-BC [facility] - Electrophysiology  11/09/2023, 1:21 PM

## 2023-11-09 ENCOUNTER — Ambulatory Visit: Attending: Pulmonary Disease | Admitting: Pulmonary Disease

## 2023-11-09 ENCOUNTER — Ambulatory Visit: Admitting: Pulmonary Disease

## 2023-11-09 ENCOUNTER — Encounter: Payer: Self-pay | Admitting: Pulmonary Disease

## 2023-11-09 VITALS — BP 100/56 | HR 50 | Ht 67.0 in | Wt 197.0 lb

## 2023-11-09 DIAGNOSIS — I472 Ventricular tachycardia, unspecified: Secondary | ICD-10-CM | POA: Diagnosis not present

## 2023-11-09 DIAGNOSIS — R002 Palpitations: Secondary | ICD-10-CM | POA: Diagnosis not present

## 2023-11-09 DIAGNOSIS — Z79899 Other long term (current) drug therapy: Secondary | ICD-10-CM

## 2023-11-09 MED ORDER — METOPROLOL SUCCINATE ER 25 MG PO TB24: ORAL_TABLET | ORAL | 3 refills | Status: DC

## 2023-11-09 NOTE — Patient Instructions (Addendum)
 Medication Instructions:  Decrease Metoprolol  Succinate (Toprol -XL) 25 mg take one half tablet (12.5 mg) twice daily *If you need a refill on your cardiac medications before your next appointment, please call your pharmacy*   Follow-Up: At [facility], you and your health needs are our priority.  As part of our continuing mission to provide you with exceptional heart care, our providers are all part of one team.  This team includes your primary Cardiologist (physician) and Advanced Practice Providers or APPs (Physician Assistants and Nurse Practitioners) who all work together to provide you with the care you need, when you need it.  Your next appointment:   6 month(s)  Provider:   Daphne Barrack, NP

## 2023-11-18 DIAGNOSIS — R300 Dysuria: Secondary | ICD-10-CM | POA: Diagnosis not present

## 2023-11-18 DIAGNOSIS — M25551 Pain in right hip: Secondary | ICD-10-CM | POA: Diagnosis not present

## 2023-11-18 DIAGNOSIS — M545 Low back pain, unspecified: Secondary | ICD-10-CM | POA: Diagnosis not present

## 2023-11-18 DIAGNOSIS — M1990 Unspecified osteoarthritis, unspecified site: Secondary | ICD-10-CM | POA: Diagnosis not present

## 2023-11-18 DIAGNOSIS — Z79899 Other long term (current) drug therapy: Secondary | ICD-10-CM | POA: Diagnosis not present

## 2023-11-18 DIAGNOSIS — M79672 Pain in left foot: Secondary | ICD-10-CM | POA: Diagnosis not present

## 2023-11-18 DIAGNOSIS — M79671 Pain in right foot: Secondary | ICD-10-CM | POA: Diagnosis not present

## 2023-11-18 DIAGNOSIS — M0589 Other rheumatoid arthritis with rheumatoid factor of multiple sites: Secondary | ICD-10-CM | POA: Diagnosis not present

## 2023-11-19 DIAGNOSIS — R300 Dysuria: Secondary | ICD-10-CM | POA: Diagnosis not present

## 2023-11-23 ENCOUNTER — Encounter: Payer: Self-pay | Admitting: *Deleted

## 2023-11-25 ENCOUNTER — Other Ambulatory Visit: Payer: Self-pay | Admitting: Pulmonary Disease

## 2023-11-30 ENCOUNTER — Other Ambulatory Visit: Payer: Self-pay | Admitting: Pulmonary Disease

## 2023-11-30 MED ORDER — STIOLTO RESPIMAT 2.5-2.5 MCG/ACT IN AERS: 1.0000 | INHALATION_SPRAY | Freq: Every day | RESPIRATORY_TRACT | 5 refills | Status: DC

## 2023-11-30 MED ORDER — STIOLTO RESPIMAT 2.5-2.5 MCG/ACT IN AERS: 1.0000 | INHALATION_SPRAY | Freq: Every day | RESPIRATORY_TRACT | 5 refills | Status: AC

## 2023-11-30 NOTE — Telephone Encounter (Unsigned)
 Copied from CRM [id number]. Topic: Clinical - Medication Refill >> Nov 30, 2023 10:24 AM Russell PARAS wrote: Medication:   Tiotropium Bromide-Olodaterol (STIOLTO RESPIMAT ) 2.5-2.5 MCG/ACT AERS   Has the patient contacted their pharmacy? Yes (Agent: If no, request that the patient contact the pharmacy for the refill. If patient does not wish to contact the pharmacy document the reason why and proceed with request.) (Agent: If yes, when and what did the pharmacy advise?)  This is the patient's preferred pharmacy:  [phone] [place] - [address] [place] [address] Phone: [phone] Fax: [phone]  Is this the correct pharmacy for this prescription? Yes If no, delete pharmacy and type the correct one.   Has the prescription been filled recently? Yes  Is the patient out of the medication? No  Has the patient been seen for an appointment in the last year OR does the patient have an upcoming appointment? Yes, last seen 07/01/2023 w/Mannam  Can we respond through MyChart? Yes  Agent: Please be advised that Rx refills may take up to 3 business days. We ask that you follow-up with your pharmacy.

## 2023-11-30 NOTE — Telephone Encounter (Addendum)
 Patient daughter Ellouise  called stating she did receive Stiolto respimat  prescription sent in 11/25/2023. Will call pharmacy to confirm. Pharmacy said the did not receive prescription. [facility] NFN

## 2023-11-30 NOTE — Addendum Note (Signed)
 Addended by: MOODY RANCHER on: 11/30/2023 03:25 PM   Modules accepted: Orders

## 2023-11-30 NOTE — Addendum Note (Signed)
 Addended by: MOODY RANCHER on: 11/30/2023 03:20 PM   Modules accepted: Orders

## 2023-12-05 ENCOUNTER — Other Ambulatory Visit: Payer: Self-pay | Admitting: Cardiology

## 2023-12-06 DIAGNOSIS — J321 Chronic frontal sinusitis: Secondary | ICD-10-CM | POA: Diagnosis not present

## 2023-12-06 DIAGNOSIS — R1314 Dysphagia, pharyngoesophageal phase: Secondary | ICD-10-CM | POA: Diagnosis not present

## 2023-12-06 DIAGNOSIS — J348210 External nasal valve collapse, unspecified: Secondary | ICD-10-CM | POA: Diagnosis not present

## 2023-12-08 ENCOUNTER — Other Ambulatory Visit: Payer: Self-pay | Admitting: Otolaryngology

## 2023-12-08 DIAGNOSIS — R053 Chronic cough: Secondary | ICD-10-CM

## 2023-12-08 DIAGNOSIS — R1310 Dysphagia, unspecified: Secondary | ICD-10-CM

## 2023-12-09 ENCOUNTER — Ambulatory Visit: Attending: Anesthesiology | Admitting: Anesthesiology

## 2023-12-09 ENCOUNTER — Encounter: Payer: Self-pay | Admitting: Anesthesiology

## 2023-12-09 DIAGNOSIS — M25551 Pain in right hip: Secondary | ICD-10-CM

## 2023-12-09 DIAGNOSIS — M25552 Pain in left hip: Secondary | ICD-10-CM | POA: Diagnosis not present

## 2023-12-09 DIAGNOSIS — F1190 Opioid use, unspecified, uncomplicated: Secondary | ICD-10-CM

## 2023-12-09 DIAGNOSIS — M0579 Rheumatoid arthritis with rheumatoid factor of multiple sites without organ or systems involvement: Secondary | ICD-10-CM

## 2023-12-09 DIAGNOSIS — M5431 Sciatica, right side: Secondary | ICD-10-CM | POA: Diagnosis not present

## 2023-12-09 DIAGNOSIS — G894 Chronic pain syndrome: Secondary | ICD-10-CM

## 2023-12-09 DIAGNOSIS — M4716 Other spondylosis with myelopathy, lumbar region: Secondary | ICD-10-CM | POA: Diagnosis not present

## 2023-12-09 DIAGNOSIS — M79641 Pain in right hand: Secondary | ICD-10-CM | POA: Diagnosis not present

## 2023-12-09 DIAGNOSIS — M797 Fibromyalgia: Secondary | ICD-10-CM

## 2023-12-09 DIAGNOSIS — M48062 Spinal stenosis, lumbar region with neurogenic claudication: Secondary | ICD-10-CM

## 2023-12-09 DIAGNOSIS — M79642 Pain in left hand: Secondary | ICD-10-CM

## 2023-12-09 DIAGNOSIS — M47817 Spondylosis without myelopathy or radiculopathy, lumbosacral region: Secondary | ICD-10-CM

## 2023-12-09 MED ORDER — HYDROCODONE-ACETAMINOPHEN 7.5-325 MG PO TABS: 1.0000 | ORAL_TABLET | Freq: Four times a day (QID) | ORAL | 0 refills | Status: AC | PRN

## 2023-12-09 MED ORDER — HYDROCODONE-ACETAMINOPHEN 7.5-325 MG PO TABS: 1.0000 | ORAL_TABLET | Freq: Four times a day (QID) | ORAL | 0 refills | Status: DC | PRN

## 2023-12-12 ENCOUNTER — Other Ambulatory Visit: Payer: Self-pay | Admitting: General Practice

## 2023-12-14 NOTE — Progress Notes (Signed)
 Virtual Visit via Telephone Note  I connected with Kelsey VEAR Diaz on 12/14/23 at 11:20 AM EDT by telephone and verified that I am speaking with the correct person using two identifiers.  Location: Patient: Home Provider: Pain control center   I discussed the limitations, risks, security and privacy concerns of performing an evaluation and management service by telephone and the availability of in person appointments. I also discussed with the patient that there may be a patient responsible charge related to this service. The patient expressed understanding and agreed to proceed.   History of Present Illness: I spoke with Kelsey Diaz via telephone as we were unable link for the video portion of the conference.  She reports that she has been doing reasonably well and her medications continue to control her lower back pain and intermittent neck pain.  The quality characteristic and distribution of the pain remained stable with no recent changes noted.  She continues to headache her medications as prescribed and these give her about 75% relief lasting 4 to 6 hours before she has recurrence of her same baseline pain.  Otherwise she is in her usual state of health with no new contributory changes noted.  She is tolerating her medications well without side effect.  Review of systems: General: No fevers or chills Pulmonary: No shortness of breath or dyspnea Cardiac: No angina or palpitations or lightheadedness GI: No abdominal pain or constipation Psych: No depression    Observations/Objective:  Current Outpatient Medications:    [START ON 01/21/2024] HYDROcodone -acetaminophen  (NORCO) 7.5-325 MG tablet, Take 1 tablet by mouth every 6 (six) hours as needed for moderate pain (pain score 4-6) or severe pain (pain score 7-10)., Disp: 120 tablet, Rfl: 0   albuterol  (ACCUNEB ) 0.63 MG/3ML nebulizer solution, Take 1 ampule by nebulization every 6 (six) hours as needed for wheezing or shortness of  breath., Disp: , Rfl:    albuterol  (VENTOLIN  HFA) 108 (90 Base) MCG/ACT inhaler, INHALE 2 PUFFS BY MOUTH EVERY 4 HRS AS NEEDED, Disp: 18 each, Rfl: 1   amiodarone  (PACERONE ) 100 MG tablet, Take 1 tablet (100 mg total) by mouth daily., Disp: 90 tablet, Rfl: 1   aspirin  81 MG chewable tablet, Chew 81 mg by mouth daily., Disp: , Rfl:    Blood Glucose Monitoring Suppl DEVI, 1 each by Does not apply route in the morning, at noon, and at bedtime. May substitute to any manufacturer covered by patient's insurance., Disp: 1 each, Rfl: 0   Calcium  Carbonate-Vitamin D  600-200 MG-UNIT TABS, Take 1 tablet by mouth daily. , Disp: , Rfl:    Carboxymethylcellulose Sodium (EYE DROPS OP), Place 2 drops into both eyes as needed (clear eyes)., Disp: , Rfl:    Clobetasol  Prop Emollient Base 0.05 % emollient cream, Apply 1 application  topically 2 (two) times daily as needed (skin irritation)., Disp: , Rfl:    clotrimazole-betamethasone  (LOTRISONE) cream, APPLY 1 APPLICATION TOPICALLY TWICE A DAY FOR 5 DAYS, Disp: 15 g, Rfl: 1   CRANBERRY PO, Take 1 tablet by mouth daily., Disp: , Rfl:    DULoxetine  (CYMBALTA ) 60 MG capsule, Take 60 mg by mouth 2 (two) times daily., Disp: , Rfl:    etanercept (ENBREL) 50 MG/ML injection, Inject 50 mg into the skin every Wednesday., Disp: , Rfl:    fluticasone  (FLONASE ) 50 MCG/ACT nasal spray, Place 2 sprays into both nostrils at bedtime., Disp: , Rfl:    gabapentin  (NEURONTIN ) 100 MG capsule, Take 1 capsule (100 mg total) by mouth at bedtime.,  Disp: 30 capsule, Rfl: 3   [START ON 12/22/2023] HYDROcodone -acetaminophen  (NORCO) 7.5-325 MG tablet, Take 1 tablet by mouth every 6 (six) hours as needed for moderate pain (pain score 4-6) or severe pain (pain score 7-10)., Disp: 120 tablet, Rfl: 0   levothyroxine  (SYNTHROID ) 50 MCG tablet, Take 1 tablet (50 mcg total) by mouth daily., Disp: 90 tablet, Rfl: 0   magnesium  oxide (MAG-OX) 400 (240 Mg) MG tablet, Take 1 tablet (400 mg total) by mouth  daily., Disp: 30 tablet, Rfl: 1   meloxicam  (MOBIC ) 15 MG tablet, TAKE 1 TABLET (15 MG TOTAL) BY MOUTH DAILY., Disp: 30 tablet, Rfl: 1   metoprolol  succinate (TOPROL -XL) 25 MG 24 hr tablet, Take 0.5 tablets (12.5 mg total) by mouth 2 (two) times daily., Disp: 180 tablet, Rfl: 3   Multiple Vitamin (MULTI-VITAMIN PO), Take 1 tablet by mouth daily. , Disp: , Rfl:    Omega-3 Fatty Acids (FISH OIL PO), Take 2 capsules by mouth daily., Disp: , Rfl:    pantoprazole  (PROTONIX ) 20 MG tablet, Take 1 tablet (20 mg total) by mouth daily., Disp: 30 tablet, Rfl: 2   rosuvastatin  (CRESTOR ) 20 MG tablet, TAKE ONE TABLET BY MOUTH WEEKLY ON MONDAY, Disp: 12 tablet, Rfl: 4   Tiotropium Bromide-Olodaterol (STIOLTO RESPIMAT ) 2.5-2.5 MCG/ACT AERS, Inhale 1 puff into the lungs daily., Disp: 4 g, Rfl: 5  Past Medical History:  Diagnosis Date   Abnormal nuclear stress test 06/03/2017   Allergic rhinitis    Anxiety    Asthma    Bacteremia 04/05/2021   Bilateral hip pain 11/28/2018   Chest pain 06/03/2017   Colitis    COPD (chronic obstructive pulmonary disease) (HCC)    Dizzy 06/13/2012   E-coli UTI 04/04/2021   Educated about COVID-19 virus infection 07/02/2019   Fibromyalgia 05/16/2019   GERD (gastroesophageal reflux disease)    With esophageal strictures   Hand pain 11/28/2018   Hypertension    Hyperthyroidism    Hypomagnesemia 05/03/2022   Normocytic anemia 05/03/2022   OSA (obstructive sleep apnea) 02/09/2012   No mask.  Did not tolerate   Osteoarthritis    PNA (pneumonia) 09/13/2017   Rheumatoid arthritis (HCC) 10/03/2018   Sepsis due to Escherichia coli (E. coli) (HCC) 04/05/2021   SOB (shortness of breath) 07/02/2019   Thrombocytopenia 05/03/2022    Assessment and Plan: 1. Lumbar spondylosis with myelopathy   2. Bilateral hip pain   3. Chronic, continuous use of opioids   4. Rheumatoid arthritis involving multiple sites with positive rheumatoid factor (HCC)   5. Chronic pain syndrome    6. Fibromyalgia   7. Right sided sciatica   8. Pain in both hands   9. Facet arthritis of lumbosacral region   10. Spinal stenosis of lumbar region with neurogenic claudication    Based on conversation and after review of the [place]  practitioner database information I think it is appropriate to refill her medicines for the next 2 months.  Refills will be dated for 1029 and 1128 with return to clinic scheduled in 2 months.  Continue her stretching strengthening exercises as reviewed and continue follow-up with her primary care physician for baseline medical care.  Follow Up Instructions:    I discussed the assessment and treatment plan with the patient. The patient was provided an opportunity to ask questions and all were answered. The patient agreed with the plan and demonstrated an understanding of the instructions.   The patient was advised to call back or seek an in-person  evaluation if the symptoms worsen or if the condition fails to improve as anticipated.  I provided 30 minutes of non-face-to-face time during this encounter.   Lynwood KANDICE Clause, MD

## 2023-12-21 ENCOUNTER — Ambulatory Visit
Admission: RE | Admit: 2023-12-21 | Discharge: 2023-12-21 | Disposition: A | Discharge status: Home / Self Care | Source: Ambulatory Visit | Attending: Otolaryngology | Admitting: Otolaryngology

## 2023-12-21 DIAGNOSIS — R1310 Dysphagia, unspecified: Secondary | ICD-10-CM | POA: Diagnosis not present

## 2023-12-21 DIAGNOSIS — R053 Chronic cough: Secondary | ICD-10-CM | POA: Diagnosis not present

## 2023-12-21 NOTE — Progress Notes (Addendum)
 Modified Barium Swallow Study  Patient Details  Name: Kelsey Diaz MRN: [id number] Date of Birth: 1946/02/10  Today's Date: 12/21/2023  Modified Barium Swallow completed.  Full report located under Chart Review in the Imaging Section.  History of Present Illness Pt is a 78 yo female w/ medical dxs including GERD on a PPI(recently switched to another medicaion, a 20mg  PPI), Mod-Large Hiatal Hernia, Esophageal strictures per chart note, COPD, asthma, HTN, OSA, RA, Obesity, Fibromyalgia- see medical history per chart for further.    CT Chest 07/07/2023: Biapical pleuroparenchymal scarring.Centrilobular and  paraseptal emphysema. Negative for subpleural reticulation, traction  bronchiectasis/bronchiolectasis, ground glass, architectural distortion or honeycombing. Scattered pulmonary parenchymal scarring. No pleural fluid. Airway is unremarkable. Emphysema.   Moderate to Large Hiatal Hernia present per CT.     Pt denied any neurological history; she denied any change/decline in her diet except to say that she is eating more fast food and sandwiches at home.  Noted she c/o increased GERD s/s to her Pulmonologist at an Office Visit 06/2023.  She endorsed increased s/s of GERD currently(Globus, Belching, Burning sensation).  She stated her GERD medication had recently been changed and seemed less effective).  Pt denied any dx'd pneumonia in recent 1 year.  She lives at home w/ her Daughter whom she assists.    Clinical Impression Patient presents with functional oropharyngeal phase swallowing, and in setting of Known h/o Esophageal phase Dysmotility/dxs. ANY Esophageal phase deficits can impact the oropharyngeal phases of swallowing and increase risk for aspiration of REFLUX material during Retrograde flow. No oropharyngeal phase dysphagia noted during this study; No aspiration occurred during this study.    Oral phase is c/b adequate lip closure, bolus preparation, mastication, and  containment, and anterior to posterior transit. Swallow initiation occurs primarily at the level of the valleculae w/ inconsistent spillage from the valleculae during trials of thin liquids- this would be wfl for age.  Pharyngeal phase is noted for adequate tongue base retraction, adequate hyolaryngeal excursion, and adequate pharyngeal constriction. Pharyngeal stripping wave is complete. Epiglottic inversion is complete w/ airway protection noted during the swallow- No aspiration occurred during the study. A brief, trace coating along underneath side of epiglottis occurred 1x during the First tsp trial(only) of the study; it cleared w/ completion of swallow.     Amplitude/duration of cricopharyngeus opening appeared [facility]. There was adequate/complete clearance through the upper/viewable Cervical Esophagus. At an area just below the level of the shoulders (during the height of swallowing), a min amount of bolus stasis was noted in the Esophagus w/ increased textured boluses (foods). A 13 mm barium tablet was not presented this study. Of Note, the cricopharyngeus muscle/area appeared slightly prominent. Pt does have GERD and Hiatal Hernia; Esophageal phase Dysmotility. Pt endorsed current Burning in my throat from the Acid Indigestion sometimes.    Pt was educated on the results of the study; recommendation to f/u w/ GI for management/tx options. Video viewed together and questions answered. Factors that may increase risk of adverse event in presence of aspiration Noe & Lianne 2021): Respiratory or GI disease;Frail or deconditioned (Mod-Large Hiatal Hernia; GERD)   Swallow Evaluation Recommendations Recommendations: PO diet PO Diet Recommendation: Regular; Thin liquids (Level 0) = (well-cut/chopped meats; well-moistened foods. Less Problematic foods such as Breads, Meats in diet) Liquid Administration via: Cup (less straw use d/t air swallowed) Medication Administration: Whole meds with puree (if  easier for clearance) Supervision: Patient able to self-feed Swallowing strategies: Slow rate; Minimize environmental distractions; Small bites/sips;  Follow solids with liquids Postural changes: Position pt fully upright for meals; Stay upright 30-60 min after meals; Out of bed for meals (GERD precautions) Oral care recommendations: Oral care BID (2x/day); Pt independent with oral care Recommended consults: Consider GI consultation; Consider dietitian consultation       Comer Portugal, MS, CCC-SLP Speech Language Pathologist Rehab Services; [facility] -  [phone] (ascom) Ricky Gallery 12/21/2023,2:19 PM

## 2023-12-31 ENCOUNTER — Other Ambulatory Visit: Payer: Self-pay | Admitting: General Practice

## 2024-01-14 ENCOUNTER — Ambulatory Visit: Admitting: General Practice

## 2024-01-14 ENCOUNTER — Encounter: Payer: Self-pay | Admitting: General Practice

## 2024-01-14 VITALS — BP 124/80 | HR 70 | Temp 98.1°F | Ht 67.0 in | Wt 195.0 lb

## 2024-01-14 DIAGNOSIS — K219 Gastro-esophageal reflux disease without esophagitis: Secondary | ICD-10-CM

## 2024-01-14 DIAGNOSIS — E039 Hypothyroidism, unspecified: Secondary | ICD-10-CM | POA: Diagnosis not present

## 2024-01-14 DIAGNOSIS — J4489 Other specified chronic obstructive pulmonary disease: Secondary | ICD-10-CM

## 2024-01-14 DIAGNOSIS — L308 Other specified dermatitis: Secondary | ICD-10-CM | POA: Diagnosis not present

## 2024-01-14 DIAGNOSIS — N904 Leukoplakia of vulva: Secondary | ICD-10-CM

## 2024-01-14 DIAGNOSIS — G2581 Restless legs syndrome: Secondary | ICD-10-CM | POA: Diagnosis not present

## 2024-01-14 DIAGNOSIS — I10 Essential (primary) hypertension: Secondary | ICD-10-CM

## 2024-01-14 DIAGNOSIS — R300 Dysuria: Secondary | ICD-10-CM

## 2024-01-14 DIAGNOSIS — E119 Type 2 diabetes mellitus without complications: Secondary | ICD-10-CM | POA: Diagnosis not present

## 2024-01-14 MED ORDER — CEPHALEXIN 500 MG PO CAPS: 1000.0000 mg | ORAL_CAPSULE | Freq: Two times a day (BID) | ORAL | 0 refills | Status: AC

## 2024-01-14 MED ORDER — NYSTATIN 100000 UNIT/GM EX CREA: 1.0000 | TOPICAL_CREAM | Freq: Two times a day (BID) | CUTANEOUS | 1 refills | Status: AC

## 2024-01-14 MED ORDER — CEPHALEXIN 500 MG PO CAPS: 500.0000 mg | ORAL_CAPSULE | Freq: Four times a day (QID) | ORAL | 0 refills | Status: DC

## 2024-01-14 NOTE — Assessment & Plan Note (Signed)
 Controlled.  Continue Protonix  20 mg once daily

## 2024-01-14 NOTE — Progress Notes (Signed)
 Established Patient Office Visit  Subjective   Patient ID: Kelsey Diaz, female    DOB: October 19, 1945  Age: 78 y.o. MRN: [id number]  Chief Complaint  Patient presents with   Chronic care management    Patient here today to follow up on chronic conditions   Dysuria    And pain with urination. Patient states she always feels like she has a UTI.    Rash    Under breasts x couple months. Patient states its been oozing. Patient applies abx ointment to area but not really helping.     Dysuria  Pertinent negatives include no chills, frequency, nausea, urgency or vomiting.  Rash Pertinent negatives include no diarrhea, fever, shortness of breath or vomiting.    Kelsey Diaz is a   Discussed the use of AI scribe software for clinical note transcription with the patient, who gave verbal consent to proceed.  History of Present Illness Kelsey Diaz is a 78 year old female with COPD and type 2 diabetes who presents for a follow-up visit. She is accompanied by her son-in-law.  She has a rash under her breasts that has been present for a couple of months and is now oozing. She has not tried any treatment for this rash yet. No fevers or chills are reported.  She experiences significant discomfort from lichen sclerosus, which causes 'white thickening' and bright red irritation in the genital area. She frequently sits on ice to manage the discomfort. She has been prescribed medication by her gynecologist, but she feels it has worsened her condition.  She reports painful urination that began shortly after her last visit in September. The pain occurs 'all down in here' when she urinates. No fever, chills, blood in the urine, or back pain are reported. She occasionally uses Depends when going out for extended periods.  She has a history of COPD and is currently using albuterol  as needed and Stiolto Respimat  (tiotropium bromide) regularly. She reports shortness of breath but no chest  pain.  Her type 2 diabetes is currently managed without medication, and her last A1c was 6.4. She has not adhered strictly to her diet over the past three months, stating 'I can't cook for myself anymore. I'm too dizzy and have to eat a lot of sandwiches.'  She is on medication for a high thyroid  level, which was increased to 50 mcg recently.  She is on Enbrel weekly for her rheumatology condition and continues to follow up with her rheumatologist.     Patient Active Problem List   Diagnosis Date Noted   Lichen sclerosus of female genitalia 01/14/2024   Dermatitis associated with moisture 01/14/2024   Gastroesophageal reflux disease 01/14/2024   Restless leg syndrome 01/14/2024   Need for influenza vaccination 10/29/2023   Type 2 diabetes mellitus without complication, without long-term current use of insulin (HCC) 10/14/2023   Hypothyroidism 10/14/2023   Spinal stenosis of lumbar region 08/17/2023   Annual visit for general adult medical examination with abnormal findings 07/14/2023   Prediabetes 07/14/2023   Dysuria 07/14/2023   Urine frequency 05/04/2023   Acute cough 05/04/2023   Bilateral leg pain 03/30/2023   Acute pain of left shoulder 03/15/2023   Insomnia 03/15/2023   Recurrent UTI 04/06/2021   Ventricular tachycardia (HCC) 12/11/2020   Fibromyalgia 05/16/2019   Rheumatoid arthritis (HCC) 10/03/2018   HTN (hypertension) 09/13/2017   OSA (obstructive sleep apnea) 02/09/2012   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   Past Medical History:  Diagnosis Date  Abnormal nuclear stress test 06/03/2017   Allergic rhinitis    Anxiety    Asthma    Bacteremia 04/05/2021   Bilateral hip pain 11/28/2018   Chest pain 06/03/2017   Colitis    COPD (chronic obstructive pulmonary disease) (HCC)    Dizzy 06/13/2012   E-coli UTI 04/04/2021   Educated about COVID-19 virus infection 07/02/2019   Fibromyalgia 05/16/2019   GERD (gastroesophageal reflux disease)    With esophageal  strictures   Hand pain 11/28/2018   Hypertension    Hyperthyroidism    Hypomagnesemia 05/03/2022   Normocytic anemia 05/03/2022   OSA (obstructive sleep apnea) 02/09/2012   No mask.  Did not tolerate   Osteoarthritis    PNA (pneumonia) 09/13/2017   Rheumatoid arthritis (HCC) 10/03/2018   Sepsis due to Escherichia coli (E. coli) (HCC) 04/05/2021   SOB (shortness of breath) 07/02/2019   Thrombocytopenia 05/03/2022   Past Surgical History:  Procedure Laterality Date   BREAST BIOPSY     x2   CATARACT EXTRACTION W/PHACO Left 09/27/2018   Procedure: CATARACT EXTRACTION PHACO AND INTRAOCULAR LENS PLACEMENT (IOC) LEFT;  Surgeon: Jaye Fallow, MD;  Location: [facility];  Service: Ophthalmology;  Laterality: Left;   CATARACT EXTRACTION W/PHACO Right 11/29/2018   Procedure: CATARACT EXTRACTION PHACO AND INTRAOCULAR LENS PLACEMENT (IOC) RIGHT;  Surgeon: Jaye Fallow, MD;  Location: [facility];  Service: Ophthalmology;  Laterality: Right;  1:22 20.1% 16.64   CHOLECYSTECTOMY     2005   KNEE SURGERY Right 2013   two torn ligaments repaired.    LEFT HEART CATH AND CORONARY ANGIOGRAPHY N/A 06/08/2017   Procedure: LEFT HEART CATH AND CORONARY ANGIOGRAPHY;  Surgeon: Burnard Debby LABOR, MD;  Location: [facility];  Service: Cardiovascular;  Laterality: N/A;   LEFT HEART CATH AND CORONARY ANGIOGRAPHY N/A 12/12/2020   Procedure: LEFT HEART CATH AND CORONARY ANGIOGRAPHY;  Surgeon: Court Dorn PARAS, MD;  Location: [facility];  Service: Cardiovascular;  Laterality: N/A;   NASAL SINUS SURGERY     V TACH ABLATION N/A 12/16/2020   Procedure: V TACH ABLATION;  Surgeon: Cindie Ole DASEN, MD;  Location: [facility];  Service: Cardiovascular;  Laterality: N/A;   VAGINAL HYSTERECTOMY     Allergies  Allergen Reactions   Morphine Other (See Comments)    Stopped heart   Advair Diskus [Fluticasone -Salmeterol] Other (See Comments)    Sensitivity to smells   Ambien  [Zolpidem Tartrate] Other (See Comments)    irritable   Amlodipine Besylate Other (See Comments)    Feels bad   Azithromycin  Other (See Comments)     Unknown   Bextra [Valdecoxib] Other (See Comments)    Stomach issues   Bupropion Other (See Comments)    involuntary muscle movements   Captopril Other (See Comments)    Ache   Irbesartan  Other (See Comments)    dizzy   Latex Itching    Bandaids only   Lisinopril Cough   Pacerone  [Amiodarone ] Other (See Comments)    falls   Pulmicort [Budesonide] Other (See Comments)    Worse breathing, sensitivity to smells   Savella [Milnacipran Hcl] Other (See Comments)    Mental fog   Spiriva [Tiotropium Bromide] Other (See Comments)    Dry mouth   Suvorexant Other (See Comments)     not effective   Tiotropium Bromide Other (See Comments)     dry mouth         10/29/2023    3:36 PM  10/14/2023   12:41 PM 07/14/2023   11:44 AM  Depression screen PHQ 2/9  Decreased Interest 0 1 1  Down, Depressed, Hopeless 2 1 0  PHQ - 2 Score 2 2 1   Altered sleeping 3 3 3   Tired, decreased energy 3 3 3   Change in appetite 3 3 3   Feeling bad or failure about yourself  0 0 0  Trouble concentrating 0 0 2  Moving slowly or fidgety/restless 3 1 2   Suicidal thoughts 0 0 0  PHQ-9 Score 14  12  14    Difficult doing work/chores Somewhat difficult Not difficult at all Somewhat difficult     Data saved with a previous flowsheet row definition       10/29/2023    3:36 PM 10/14/2023   12:41 PM 07/14/2023   11:44 AM 03/30/2023    2:38 PM  GAD 7 : Generalized Anxiety Score  Nervous, Anxious, on Edge 0 1 1 1   Control/stop worrying 2 0 0 0  Worry too much - different things 2 1 2 1   Trouble relaxing 3 1 2  0  Restless 0 0 1 0  Easily annoyed or irritable 3 1 2  0  Afraid - awful might happen 0 0 0 0  Total GAD 7 Score 10 4 8 2   Anxiety Difficulty Not difficult at all Not difficult at all Somewhat difficult Somewhat difficult      Review of Systems   Constitutional:  Negative for chills and fever.  Respiratory:  Negative for shortness of breath.   Cardiovascular:  Negative for chest pain.  Gastrointestinal:  Negative for abdominal pain, constipation, diarrhea, heartburn, nausea and vomiting.  Genitourinary:  Positive for dysuria. Negative for frequency and urgency.  Skin:  Positive for rash.  Neurological:  Negative for dizziness and headaches.  Endo/Heme/Allergies:  Negative for polydipsia.  Psychiatric/Behavioral:  Negative for depression and suicidal ideas. The patient is not nervous/anxious.       Objective:     BP 124/80   Pulse 70   Temp 98.1 F (36.7 C) (Oral)   Ht 5' 7 (1.702 m)   Wt 195 lb (88.5 kg)   SpO2 95%   BMI 30.54 kg/m  BP Readings from Last 3 Encounters:  01/14/24 124/80  11/09/23 (!) 100/56  10/29/23 116/64   Wt Readings from Last 3 Encounters:  01/14/24 195 lb (88.5 kg)  11/09/23 197 lb (89.4 kg)  10/29/23 197 lb 12.8 oz (89.7 kg)      Physical Exam Vitals and nursing note reviewed.  Constitutional:      Appearance: Normal appearance.  Cardiovascular:     Rate and Rhythm: Normal rate and regular rhythm.     Pulses: Normal pulses.     Heart sounds: Normal heart sounds.  Pulmonary:     Effort: Pulmonary effort is normal.     Breath sounds: Normal breath sounds.  Chest:       Comments: Moisture associated dermatitis  Abdominal:     General: Bowel sounds are normal.     Tenderness: There is no right CVA tenderness or left CVA tenderness.  Skin:    General: Skin is warm.  Neurological:     Mental Status: She is alert and oriented to person, place, and time.  Psychiatric:        Mood and Affect: Mood normal.        Behavior: Behavior normal.        Thought Content: Thought content normal.  Judgment: Judgment normal.      No results found for any visits on 01/14/24.     The 10-year ASCVD risk score (Arnett DK, et al., 2019) is: 46.9%    Assessment & Plan:  Type 2  diabetes mellitus without complication, without long-term current use of insulin (HCC) -     Basic metabolic panel with GFR -     Hemoglobin A1c -     Microalbumin / creatinine urine ratio; Future  Gastroesophageal reflux disease, unspecified whether esophagitis present Assessment & Plan: Controlled.  Continue Protonix  20 mg once daily    Restless leg syndrome Assessment & Plan: Continue Gabapentin  100 mg at bedtime.   Dysuria -     Urine Culture; Future -     Urinalysis; Future  Lichen sclerosus of female genitalia -     Ambulatory referral to Dermatology  Hypothyroidism, unspecified type -     TSH  Dermatitis associated with moisture -     Nystatin ; Apply 1 Application topically 2 (two) times daily.  Dispense: 60 g; Refill: 1 -     Cephalexin ; Take 2 capsules (1,000 mg total) by mouth 2 (two) times daily for 7 days.  Dispense: 28 capsule; Refill: 0  COPD with asthma (HCC)  Hypertension, unspecified type Assessment & Plan: At goal today.  Following with cardiology.  Continue medications per cardiology.     Assessment and Plan Assessment & Plan Type 2 diabetes mellitus without complications Type 2 diabetes managed without medication. Last A1c was 6.4%. - Diet controlled.  - A1c pending.  Hypothyroidism, unspecified Hypothyroidism with previously elevated thyroid  levels. Medication dose increased to 50 mcg. - TSH pending.  - Continue Levothyroxine  50 mcg once daily.  Lichen sclerosus of vulva Lichen sclerosus with white thickening and discomfort. Current treatment not effective. - Referred to dermatologist in [facility] for further evaluation and management.  Intertrigo under breast Intertrigo under the breast with oozing and broken skin.  - Prescribed Keflex  1000 mg twice daily for seven days. - Start Nystatin  cream twice daily. Rx sent.  - Advised against using triple antibiotic cream.  Dysuria Painful urination since last visit. Possible UTI or  secondary to incontinence pads. - Unable to provide urine sample for urinalysis and culture in the office today.  - supplies given to bring sample back.  - Strict ER precautions discussed with patient and son in law. Verbalizes understanding.  Chronic obstructive pulmonary disease (COPD), unspecified COPD managed with albuterol  as needed and Stiolto Respimat . Reports shortness of breath but no chest pain. - Continue current COPD management regimen.   Return in about 3 months (around 04/15/2024) for chronic care management.SABRA Carrol Aurora, NP

## 2024-01-14 NOTE — Assessment & Plan Note (Signed)
 Residual paresthesia stable. Continue Gabapentin  100 mg at bedtime.

## 2024-01-14 NOTE — Patient Instructions (Addendum)
 Stop by the lab prior to leaving today. I will notify you of your results once received.   Bring the urine sample back when you can.   Start Keflex  1000 mg twice daily for 7 days.   Start nystatin  cream for under the breast.   Schedule diabetic eye exam.   You will either be contacted via phone regarding your referral to dermatology, or you may receive a letter on your MyChart portal from our referral team with instructions for scheduling an appointment. Please let us  know if you have not been contacted by anyone within two weeks.   Follow up in 3 months.   It was a pleasure to see you today!

## 2024-01-14 NOTE — Assessment & Plan Note (Signed)
 At goal today.  Following with cardiology.  Continue medications per cardiology.

## 2024-01-15 LAB — BASIC METABOLIC PANEL WITH GFR
BUN: 14 mg/dL (ref 7–25)
CO2: 25 mmol/L (ref 20–32)
Calcium: 9.6 mg/dL (ref 8.6–10.4)
Chloride: 100 mmol/L (ref 98–110)
Creat: 0.79 mg/dL (ref 0.60–1.00)
Glucose, Bld: 109 mg/dL — ABNORMAL HIGH (ref 65–99)
Potassium: 5 mmol/L (ref 3.5–5.3)
Sodium: 136 mmol/L (ref 135–146)
eGFR: 77 mL/min/1.73m2 (ref >=60)

## 2024-01-15 LAB — TSH: TSH: 0.95 m[IU]/L (ref 0.40–4.50)

## 2024-01-15 LAB — HEMOGLOBIN A1C
Hgb A1c MFr Bld: 5.9 % — ABNORMAL HIGH (ref <5.7)
Mean Plasma Glucose: 123 mg/dL
eAG (mmol/L): 6.8 mmol/L

## 2024-01-16 ENCOUNTER — Ambulatory Visit: Payer: Self-pay | Admitting: General Practice

## 2024-01-22 ENCOUNTER — Other Ambulatory Visit: Payer: Self-pay | Admitting: General Practice

## 2024-01-22 DIAGNOSIS — E039 Hypothyroidism, unspecified: Secondary | ICD-10-CM

## 2024-01-24 ENCOUNTER — Ambulatory Visit: Admitting: Dermatology

## 2024-01-24 ENCOUNTER — Encounter: Payer: Self-pay | Admitting: Dermatology

## 2024-01-24 DIAGNOSIS — N904 Leukoplakia of vulva: Secondary | ICD-10-CM

## 2024-01-24 DIAGNOSIS — L900 Lichen sclerosus et atrophicus: Secondary | ICD-10-CM

## 2024-01-24 DIAGNOSIS — L249 Irritant contact dermatitis, unspecified cause: Secondary | ICD-10-CM | POA: Diagnosis not present

## 2024-01-24 MED ORDER — TACROLIMUS 0.1 % EX OINT: TOPICAL_OINTMENT | Freq: Two times a day (BID) | CUTANEOUS | 2 refills | Status: AC

## 2024-01-24 NOTE — Patient Instructions (Addendum)
 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 You have been experiencing significant discomfort despite previous treatments, and we have made some changes to your management plan to help alleviate your symptoms.  YOUR PLAN:  -LICHEN SCLEROSUS ET ATROPHICUS:  Lichen sclerosus et atrophicus is a chronic skin condition that causes thin, white patches of skin, often in the genital and anal areas.   We have prescribed tacrolimus  ointment for daily use on the affected areas.   For the first two weeks, mix the tacrolimus  with Desitin, then use tacrolimus  alone daily.   Use unscented Dove bar soap for washing private areas and Water Wipes for restroom use to avoid irritation.  -IRRITANT CONTACT DERMATITIS:  Irritant contact dermatitis is a skin reaction caused by exposure to irritants, such as scented moisturizers and wipes. To reduce irritation, switch to unscented Dove bar soap and Water Wipes. The prescribed tacrolimus  ointment will also help address this condition.  INSTRUCTIONS:  Please follow the new treatment plan and make the recommended changes to your hygiene routine. We have scheduled a follow-up appointment in three months to monitor your progress.    Important Information  Due to recent changes in healthcare laws, you may see results of your pathology and/or laboratory studies on MyChart before the doctors have had a chance to review them. We understand that in some cases there may be results that are confusing or concerning to you. Please understand that not all results are received at the same time and often the doctors may need to interpret multiple results in order to provide you with the best plan of care or course of treatment. Therefore, we ask that you please give us  2 business days to thoroughly review all your results before contacting the office for clarification. Should we see a  critical lab result, you will be contacted sooner.   If You Need Anything After Your Visit  If you have any questions or concerns for your doctor, please call our main line at [phone] If no one answers, please leave a voicemail as directed and we will return your call as soon as possible. Messages left after 4 pm will be answered the following business day.   You may also send us  a message via MyChart. We typically respond to MyChart messages within 1-2 business days.  For prescription refills, please ask your pharmacy to contact our office. Our fax number is [phone].  If you have an urgent issue when the clinic is closed that cannot wait until the next business day, you can page your doctor at the number below.    Please note that while we do our best to be available for urgent issues outside of office hours, we are not available 24/7.   If you have an urgent issue and are unable to reach us , you may choose to seek medical care at your doctor's office, retail clinic, urgent care center, or emergency room.  If you have a medical emergency, please immediately call 911 or go to the emergency department. In the event of inclement weather, please call our main line at [phone] for an update on the status of any delays or closures.  Dermatology Medication Tips: Please keep the boxes that topical medications come in in order to help keep track of the instructions about where and how to use these. Pharmacies typically print the medication instructions only on the boxes and not directly on the medication tubes.  If your medication is too expensive, please contact our office at [phone] or send us  a message through MyChart.   We are unable to tell what your co-pay for medications will be in advance as this is different depending on your insurance coverage. However, we may be able to find a substitute medication at lower cost or fill out paperwork to get insurance to cover a needed  medication.   If a prior authorization is required to get your medication covered by your insurance company, please allow us  1-2 business days to complete this process.  Drug prices often vary depending on where the prescription is filled and some pharmacies may offer cheaper prices.  The website www.goodrx.com contains coupons for medications through different pharmacies. The prices here do not account for what the cost may be with help from insurance (it may be cheaper with your insurance), but the website can give you the price if you did not use any insurance.  - You can print the associated coupon and take it with your prescription to the pharmacy.  - You may also stop by our office during regular business hours and pick up a GoodRx coupon card.  - If you need your prescription sent electronically to a different pharmacy, notify our office through [facility] or by phone at [phone]

## 2024-01-24 NOTE — Progress Notes (Signed)
   New Patient Visit   Subjective  Kelsey Diaz is a 78 y.o. female who presents for a NEW PATIENT appointment to be examined for the concerns as listed below.  Lichen Sclerosus: Located on the vulva that she is currently treating with Nystatin  cream that was Rx 2 weeks ago. She is applying the nystatin  BID. She has previously tried clotrimazole-betamethasone  topical but it started being ineffective. Pt also stated that she has some redness around the anus that she noticed 2mo ago that she is wondering if it is related. Pt stated that she is washing with [employer] shower gel.    The following portions of the chart were reviewed this encounter and updated as appropriate: medications, allergies, medical history  Review of Systems:  No other skin or systemic complaints except as noted in HPI or Assessment and Plan.  Objective  Well appearing patient in no apparent distress; mood and affect are within normal limits.   A focused examination was performed of the following areas: buttocks   Relevant exam findings are noted in the Assessment and Plan.    Assessment & Plan    Lichen sclerosus et atrophicus involving vulva, perineum, perianal area, and breast Biopsy-proven lichen sclerosus et atrophicus affecting the vulva, perineum, perianal area, and breast. Previous treatment with clotrimazole betamethasone  was effective but has recently become ineffective. Current symptoms include pink atrophic plaques, erythematous areas, and white sclerotic areas. The discomfort is exacerbated by irritants such as scented body washes and wipes.  - Prescribed tacrolimus ointment for daily use on affected areas. - Instructed to mix tacrolimus with Desitin for the first two weeks, then use tacrolimus alone daily. - Advised to use unscented Dove bar soap for washing private areas. - Recommended Water Wipes for restroom use to avoid irritation. - Scheduled follow-up appointment in three  months.  Irritant contact dermatitis of vulva and perineum Irritant contact dermatitis likely secondary to scented moisturizers and wipes, contributing to irritation in the vulva and perineum. - Advised to switch to unscented Dove bar soap and Water Wipes to reduce irritation. - Prescribed tacrolimus ointment to address both lichen sclerosus and irritant contact dermatitis. LICHEN SCLEROSUS   Related Medications tacrolimus (PROTOPIC) 0.1 % ointment Apply topically 2 (two) times daily. For first 2 weeks mix with Desitin prior to applying to the affected areas. IRRITANT CONTACT DERMATITIS, UNSPECIFIED TRIGGER    Return in about 3 months (around 04/23/2024), or lichen sclerosus.   Documentation: I have reviewed the above documentation for accuracy and completeness, and I agree with the above.  I, Shirron Maranda, CMA II, am acting as scribe for:   Delon Lenis, DO

## 2024-01-25 ENCOUNTER — Telehealth: Payer: Self-pay | Admitting: Cardiology

## 2024-01-25 NOTE — Telephone Encounter (Signed)
*  STAT* If patient is at the pharmacy, call can be transferred to refill team.   1. Which medications need to be refilled? (please list name of each medication and dose if known)  amiodarone  (PACERONE ) 100 MG tablet   2. Would you like to learn more about the convenience, safety, & potential cost savings by using the [facility]? no   3. Are you open to using the [facility] (Type [facility]. *no   4. Which pharmacy/location (including street and city if local pharmacy) is medication to be sent to?  [id number] - [place], [place] - [address]    5. Do they need a 30 day or 90 day supply? 90 day

## 2024-01-26 MED ORDER — AMIODARONE HCL 100 MG PO TABS: 100.0000 mg | ORAL_TABLET | Freq: Every day | ORAL | 0 refills | Status: AC

## 2024-01-26 NOTE — Telephone Encounter (Signed)
 Requested Prescriptions   Signed Prescriptions Disp Refills   amiodarone  (PACERONE ) 100 MG tablet 90 tablet 0    Sig: Take 1 tablet (100 mg total) by mouth daily.    Authorizing Provider: LAMBERT, CAMERON T    Ordering User: WILFRED, Tahari Clabaugh  C

## 2024-01-27 ENCOUNTER — Other Ambulatory Visit: Payer: Self-pay | Admitting: General Practice

## 2024-01-27 DIAGNOSIS — K219 Gastro-esophageal reflux disease without esophagitis: Secondary | ICD-10-CM

## 2024-02-06 ENCOUNTER — Other Ambulatory Visit: Payer: Self-pay | Admitting: General Practice

## 2024-02-06 DIAGNOSIS — G2581 Restless legs syndrome: Secondary | ICD-10-CM

## 2024-02-08 ENCOUNTER — Ambulatory Visit: Attending: Anesthesiology | Admitting: Anesthesiology

## 2024-02-08 ENCOUNTER — Encounter: Payer: Self-pay | Admitting: Anesthesiology

## 2024-02-08 VITALS — BP 124/78 | HR 63 | Temp 97.3°F | Resp 18 | Ht 67.5 in | Wt 199.0 lb

## 2024-02-08 DIAGNOSIS — M79642 Pain in left hand: Secondary | ICD-10-CM | POA: Diagnosis present

## 2024-02-08 DIAGNOSIS — M5431 Sciatica, right side: Secondary | ICD-10-CM | POA: Insufficient documentation

## 2024-02-08 DIAGNOSIS — F1190 Opioid use, unspecified, uncomplicated: Secondary | ICD-10-CM | POA: Insufficient documentation

## 2024-02-08 DIAGNOSIS — M4716 Other spondylosis with myelopathy, lumbar region: Secondary | ICD-10-CM | POA: Insufficient documentation

## 2024-02-08 DIAGNOSIS — M797 Fibromyalgia: Secondary | ICD-10-CM | POA: Insufficient documentation

## 2024-02-08 DIAGNOSIS — M79641 Pain in right hand: Secondary | ICD-10-CM

## 2024-02-08 DIAGNOSIS — G894 Chronic pain syndrome: Secondary | ICD-10-CM | POA: Insufficient documentation

## 2024-02-08 DIAGNOSIS — M25551 Pain in right hip: Secondary | ICD-10-CM | POA: Diagnosis present

## 2024-02-08 DIAGNOSIS — M0579 Rheumatoid arthritis with rheumatoid factor of multiple sites without organ or systems involvement: Secondary | ICD-10-CM | POA: Diagnosis present

## 2024-02-08 DIAGNOSIS — M47817 Spondylosis without myelopathy or radiculopathy, lumbosacral region: Secondary | ICD-10-CM | POA: Insufficient documentation

## 2024-02-08 DIAGNOSIS — M25552 Pain in left hip: Secondary | ICD-10-CM | POA: Insufficient documentation

## 2024-02-08 MED ORDER — HYDROCODONE-ACETAMINOPHEN 7.5-325 MG PO TABS: 1.0000 | ORAL_TABLET | Freq: Four times a day (QID) | ORAL | 0 refills | Status: AC | PRN

## 2024-02-08 NOTE — Progress Notes (Unsigned)
 Subjective:  Patient ID: Kelsey Diaz, female    DOB: [birth year]  Age: 78 y.o. MRN: [id number]  CC: Back Pain   Procedure: None  HPI DRAVEN LAINE presents for reevaluation.  Kelsey Diaz was last seen 2 months ago and continues to have complaints of low back pain with occasional burning over the anterior or dorsal portion of her right foot.  She has sciatica on the right side intermittently.  The quality characteristic and distribution of her pain symptoms and syndrome are stable without recent change.  No change in lower extremity strength or function or bowel or bladder function are noted.  She takes her 7.5 mg Percocet about every 4-6 hours and continues to get about 75% relief.  Her severity of pain is contingent on her activity she reports.  No change in bowel or bladder function.  And she tolerates her medication well.  Outpatient Medications Prior to Visit  Medication Sig Dispense Refill   albuterol  (ACCUNEB ) 0.63 MG/3ML nebulizer solution Take 1 ampule by nebulization every 6 (six) hours as needed for wheezing or shortness of breath.     albuterol  (VENTOLIN  HFA) 108 (90 Base) MCG/ACT inhaler INHALE 2 PUFFS BY MOUTH EVERY 4 HRS AS NEEDED 18 each 1   amiodarone  (PACERONE ) 100 MG tablet Take 1 tablet (100 mg total) by mouth daily. 90 tablet 0   aspirin  81 MG chewable tablet Chew 81 mg by mouth daily.     Blood Glucose Monitoring Suppl DEVI 1 each by Does not apply route in the morning, at noon, and at bedtime. May substitute to any manufacturer covered by patient's insurance. 1 each 0   Calcium  Carbonate-Vitamin D  600-200 MG-UNIT TABS Take 1 tablet by mouth daily.      Carboxymethylcellulose Sodium (EYE DROPS OP) Place 2 drops into both eyes as needed (clear eyes).     Clobetasol  Prop Emollient Base 0.05 % emollient cream Apply 1 application  topically 2 (two) times daily as needed (skin irritation).     clotrimazole-betamethasone  (LOTRISONE) cream APPLY 1 APPLICATION TOPICALLY TWICE A  DAY FOR 5 DAYS 15 g 1   CRANBERRY PO Take 1 tablet by mouth daily.     DULoxetine  (CYMBALTA ) 60 MG capsule TAKE 1 CAPSULE BY MOUTH TWICE A DAY 180 capsule 1   etanercept (ENBREL) 50 MG/ML injection Inject 50 mg into the skin every Wednesday.     fluticasone  (FLONASE ) 50 MCG/ACT nasal spray Place 2 sprays into both nostrils at bedtime.     gabapentin  (NEURONTIN ) 100 MG capsule TAKE 1 CAPSULE BY MOUTH AT BEDTIME. 30 capsule 3   levothyroxine  (SYNTHROID ) 50 MCG tablet TAKE 1 TABLET BY MOUTH EVERY DAY 90 tablet 0   magnesium  oxide (MAG-OX) 400 (240 Mg) MG tablet Take 1 tablet (400 mg total) by mouth daily. 30 tablet 1   meloxicam  (MOBIC ) 15 MG tablet TAKE 1 TABLET (15 MG TOTAL) BY MOUTH DAILY. 30 tablet 1   metoprolol  succinate (TOPROL -XL) 25 MG 24 hr tablet Take 0.5 tablets (12.5 mg total) by mouth 2 (two) times daily. 180 tablet 3   Multiple Vitamin (MULTI-VITAMIN PO) Take 1 tablet by mouth daily.      nystatin  cream (MYCOSTATIN ) Apply 1 Application topically 2 (two) times daily. 60 g 1   Omega-3 Fatty Acids (FISH OIL PO) Take 2 capsules by mouth daily.     pantoprazole  (PROTONIX ) 20 MG tablet TAKE 1 TABLET BY MOUTH EVERY DAY 90 tablet 1   rosuvastatin  (CRESTOR ) 20 MG tablet TAKE ONE  TABLET BY MOUTH WEEKLY ON MONDAY 12 tablet 4   tacrolimus  (PROTOPIC ) 0.1 % ointment Apply topically 2 (two) times daily. For first 2 weeks mix with Desitin prior to applying to the affected areas. 100 g 2   Tiotropium Bromide-Olodaterol (STIOLTO RESPIMAT ) 2.5-2.5 MCG/ACT AERS Inhale 1 puff into the lungs daily. 4 g 5   HYDROcodone -acetaminophen  (NORCO) 7.5-325 MG tablet Take 1 tablet by mouth every 6 (six) hours as needed for moderate pain (pain score 4-6) or severe pain (pain score 7-10). 120 tablet 0   No facility-administered medications prior to visit.    Review of Systems CNS: No confusion or sedation Cardiac: No angina or palpitations GI: No abdominal pain or constipation Constitutional: No nausea vomiting  fevers or chills  Objective:  BP 124/78 (BP Location: Right Arm, Patient Position: Sitting, Cuff Size: Normal)   Pulse 63   Temp (!) 97.3 F (36.3 C) (Temporal)   Resp 18   Ht 5' 7.5 (1.715 m)   Wt 199 lb (90.3 kg)   SpO2 93%   BMI 30.71 kg/m    BP Readings from Last 3 Encounters:  02/08/24 124/78  01/14/24 124/80  11/09/23 (!) 100/56     Wt Readings from Last 3 Encounters:  02/08/24 199 lb (90.3 kg)  01/14/24 195 lb (88.5 kg)  11/09/23 197 lb (89.4 kg)     Physical Exam Pt is alert and oriented PERRL EOMI HEART IS RRR no murmur or rub LCTA no wheezing or rales MUSCULOSKELETAL reveals some paraspinous muscle tenderness in the lumbar region but no overt trigger points.  She walks with a mildly antalgic gait and does require some assistance with walking.  Labs  Lab Results  Component Value Date   HGBA1C 5.9 (H) 01/14/2024   HGBA1C 6.4 (H) 10/14/2023   HGBA1C 6.6 (H) 07/14/2023   Lab Results  Component Value Date   LDLCALC 30 07/14/2023   CREATININE 0.79 01/14/2024    -------------------------------------------------------------------------------------------------------------------- Lab Results  Component Value Date   WBC 7.4 07/14/2023   HGB 13.5 07/14/2023   HCT 41.1 07/14/2023   PLT 188.0 07/14/2023   GLUCOSE 109 (H) 01/14/2024   CHOL 144 07/14/2023   TRIG 205.0 (H) 07/14/2023   HDL 72.30 07/14/2023   LDLCALC 30 07/14/2023   ALT 19 07/14/2023   AST 23 07/14/2023   NA 136 01/14/2024   K 5.0 01/14/2024   CL 100 01/14/2024   CREATININE 0.79 01/14/2024   BUN 14 01/14/2024   CO2 25 01/14/2024   TSH 0.95 01/14/2024   INR 1.0 04/04/2021   HGBA1C 5.9 (H) 01/14/2024    --------------------------------------------------------------------------------------------------------------------- DG SWALLOW FUNC OP MEDICARE SPEECH PATH Result Date: 12/21/2023 CLINICAL DATA:  Dysphagia unspecified Chronic cough 78 year old female with history of chronic cough.  EXAM: MODIFIED BARIUM SWALLOW TECHNIQUE: Different consistencies of barium were administered orally to the patient by the Speech Pathologist. Imaging of the pharynx was performed in the lateral projection. McKenzie McInnis PA-C was present in the fluoroscopy room during this study, which was supervised and interpreted by Dr. JINNY Hall. FLUOROSCOPY: Radiation Exposure Index (as provided by the fluoroscopic device): 4.2 mGy Kerma COMPARISON:  CT chest, 07/07/2023. FINDINGS: Vestibular Penetration: Flash penetration on initial swallow with thin liquids neck clears on its own. Aspiration:  None seen. Other:  None. IMPRESSION: Flash laryngeal penetration with thin liquids. No evidence of frank tracheal aspiration. Please refer to the Speech Pathologists report for complete details and recommendations. Electronically Signed   By: Thom Hall HERO.D.  On: 12/21/2023 16:35     Assessment & Plan:   Kelsey Diaz was seen today for back pain.  Diagnoses and all orders for this visit:  Lumbar spondylosis with myelopathy  Bilateral hip pain  Chronic, continuous use of opioids  Fibromyalgia  Chronic pain syndrome  Rheumatoid arthritis involving multiple sites with positive rheumatoid factor (HCC)  Right sided sciatica  Pain in both hands  Facet arthritis of lumbosacral region  Other orders -     HYDROcodone -acetaminophen  (NORCO) 7.5-325 MG tablet; Take 1 tablet by mouth every 6 (six) hours as needed for moderate pain (pain score 4-6) or severe pain (pain score 7-10). -     HYDROcodone -acetaminophen  (NORCO) 7.5-325 MG tablet; Take 1 tablet by mouth every 6 (six) hours as needed for moderate pain (pain score 4-6) or severe pain (pain score 7-10).        ----------------------------------------------------------------------------------------------------------------------  Problem List Items Addressed This Visit       Unprioritized   Fibromyalgia   Relevant Medications   HYDROcodone -acetaminophen   (NORCO) 7.5-325 MG tablet (Start on 02/23/2024)   HYDROcodone -acetaminophen  (NORCO) 7.5-325 MG tablet (Start on 03/24/2024)   Rheumatoid arthritis (HCC)   Relevant Medications   HYDROcodone -acetaminophen  (NORCO) 7.5-325 MG tablet (Start on 02/23/2024)   HYDROcodone -acetaminophen  (NORCO) 7.5-325 MG tablet (Start on 03/24/2024)   Other Visit Diagnoses       Lumbar spondylosis with myelopathy    -  Primary     Bilateral hip pain         Chronic, continuous use of opioids         Chronic pain syndrome       Relevant Medications   HYDROcodone -acetaminophen  (NORCO) 7.5-325 MG tablet (Start on 02/23/2024)   HYDROcodone -acetaminophen  (NORCO) 7.5-325 MG tablet (Start on 03/24/2024)     Right sided sciatica         Pain in both hands         Facet arthritis of lumbosacral region       Relevant Medications   HYDROcodone -acetaminophen  (NORCO) 7.5-325 MG tablet (Start on 02/23/2024)   HYDROcodone -acetaminophen  (NORCO) 7.5-325 MG tablet (Start on 03/24/2024)         ----------------------------------------------------------------------------------------------------------------------  1. Lumbar spondylosis with myelopathy (Primary) Continue core stretching strengthening exercises as reviewed.  She has had epidural steroid injections in the past have offered these once again however secondary to financial constraints she does not desire to proceed with these.  She does report that she generally does get good relief.  2. Bilateral hip pain As above  3. Chronic, continuous use of opioids I have reviewed the [place]  practitioner database information and it is appropriate for refill.  Refills will be generated for December 30 and January 30.  No other changes are initiated today.  4. Fibromyalgia Continue stretching strengthening exercises.  5. Chronic pain syndrome As above and she continues to derive good functional lifestyle improvement with chronic opioid therapy where she has failed  this in the past.  6. Rheumatoid arthritis involving multiple sites with positive rheumatoid factor (HCC) As above  7. Right sided sciatica   8. Pain in both hands   9. Facet arthritis of lumbosacral region     ----------------------------------------------------------------------------------------------------------------------  I am having Kelsey Diaz start on HYDROcodone -acetaminophen . I am also having her maintain her Calcium  Carbonate-Vitamin D , Multiple Vitamin (MULTI-VITAMIN PO), Omega-3 Fatty Acids (FISH OIL PO), Clobetasol  Prop Emollient Base, Enbrel, aspirin , fluticasone , CRANBERRY PO, Carboxymethylcellulose Sodium (EYE DROPS OP), magnesium  oxide, albuterol , clotrimazole-betamethasone , albuterol , Blood Glucose Monitoring Suppl, rosuvastatin ,  Stiolto Respimat , metoprolol  succinate, meloxicam , DULoxetine , nystatin  cream, levothyroxine , tacrolimus , amiodarone , pantoprazole , gabapentin , and HYDROcodone -acetaminophen .   Meds ordered this encounter  Medications   HYDROcodone -acetaminophen  (NORCO) 7.5-325 MG tablet    Sig: Take 1 tablet by mouth every 6 (six) hours as needed for moderate pain (pain score 4-6) or severe pain (pain score 7-10).    Dispense:  120 tablet    Refill:  0   HYDROcodone -acetaminophen  (NORCO) 7.5-325 MG tablet    Sig: Take 1 tablet by mouth every 6 (six) hours as needed for moderate pain (pain score 4-6) or severe pain (pain score 7-10).    Dispense:  120 tablet    Refill:  0   Patient's Medications  New Prescriptions   HYDROCODONE -ACETAMINOPHEN  (NORCO) 7.5-325 MG TABLET    Take 1 tablet by mouth every 6 (six) hours as needed for moderate pain (pain score 4-6) or severe pain (pain score 7-10).  Previous Medications   ALBUTEROL  (ACCUNEB ) 0.63 MG/3ML NEBULIZER SOLUTION    Take 1 ampule by nebulization every 6 (six) hours as needed for wheezing or shortness of breath.   ALBUTEROL  (VENTOLIN  HFA) 108 (90 BASE) MCG/ACT INHALER    INHALE 2 PUFFS BY MOUTH  EVERY 4 HRS AS NEEDED   AMIODARONE  (PACERONE ) 100 MG TABLET    Take 1 tablet (100 mg total) by mouth daily.   ASPIRIN  81 MG CHEWABLE TABLET    Chew 81 mg by mouth daily.   BLOOD GLUCOSE MONITORING SUPPL DEVI    1 each by Does not apply route in the morning, at noon, and at bedtime. May substitute to any manufacturer covered by patient's insurance.   CALCIUM  CARBONATE-VITAMIN D  600-200 MG-UNIT TABS    Take 1 tablet by mouth daily.    CARBOXYMETHYLCELLULOSE SODIUM (EYE DROPS OP)    Place 2 drops into both eyes as needed (clear eyes).   CLOBETASOL  PROP EMOLLIENT BASE 0.05 % EMOLLIENT CREAM    Apply 1 application  topically 2 (two) times daily as needed (skin irritation).   CLOTRIMAZOLE-BETAMETHASONE  (LOTRISONE) CREAM    APPLY 1 APPLICATION TOPICALLY TWICE A DAY FOR 5 DAYS   CRANBERRY PO    Take 1 tablet by mouth daily.   DULOXETINE  (CYMBALTA ) 60 MG CAPSULE    TAKE 1 CAPSULE BY MOUTH TWICE A DAY   ETANERCEPT (ENBREL) 50 MG/ML INJECTION    Inject 50 mg into the skin every Wednesday.   FLUTICASONE  (FLONASE ) 50 MCG/ACT NASAL SPRAY    Place 2 sprays into both nostrils at bedtime.   GABAPENTIN  (NEURONTIN ) 100 MG CAPSULE    TAKE 1 CAPSULE BY MOUTH AT BEDTIME.   LEVOTHYROXINE  (SYNTHROID ) 50 MCG TABLET    TAKE 1 TABLET BY MOUTH EVERY DAY   MAGNESIUM  OXIDE (MAG-OX) 400 (240 MG) MG TABLET    Take 1 tablet (400 mg total) by mouth daily.   MELOXICAM  (MOBIC ) 15 MG TABLET    TAKE 1 TABLET (15 MG TOTAL) BY MOUTH DAILY.   METOPROLOL  SUCCINATE (TOPROL -XL) 25 MG 24 HR TABLET    Take 0.5 tablets (12.5 mg total) by mouth 2 (two) times daily.   MULTIPLE VITAMIN (MULTI-VITAMIN PO)    Take 1 tablet by mouth daily.    NYSTATIN  CREAM (MYCOSTATIN )    Apply 1 Application topically 2 (two) times daily.   OMEGA-3 FATTY ACIDS (FISH OIL PO)    Take 2 capsules by mouth daily.   PANTOPRAZOLE  (PROTONIX ) 20 MG TABLET    TAKE 1 TABLET BY MOUTH EVERY DAY   ROSUVASTATIN  (  CRESTOR ) 20 MG TABLET    TAKE ONE TABLET BY MOUTH WEEKLY ON MONDAY    TACROLIMUS  (PROTOPIC ) 0.1 % OINTMENT    Apply topically 2 (two) times daily. For first 2 weeks mix with Desitin prior to applying to the affected areas.   TIOTROPIUM BROMIDE-OLODATEROL (STIOLTO RESPIMAT ) 2.5-2.5 MCG/ACT AERS    Inhale 1 puff into the lungs daily.  Modified Medications   Modified Medication Previous Medication   HYDROCODONE -ACETAMINOPHEN  (NORCO) 7.5-325 MG TABLET HYDROcodone -acetaminophen  (NORCO) 7.5-325 MG tablet      Take 1 tablet by mouth every 6 (six) hours as needed for moderate pain (pain score 4-6) or severe pain (pain score 7-10).    Take 1 tablet by mouth every 6 (six) hours as needed for moderate pain (pain score 4-6) or severe pain (pain score 7-10).  Discontinued Medications   No medications on file   ----------------------------------------------------------------------------------------------------------------------  Follow-up: Return in about 2 months (around 04/10/2024) for evaluation, med refill.  Continue follow-up with her primary care physicians for baseline medical care with return to clinic in 2 months.  Lynwood KANDICE Clause, MD

## 2024-02-08 NOTE — Progress Notes (Unsigned)
 Nursing Pain Medication Assessment:  Safety precautions to be maintained throughout the outpatient stay will include: orient to surroundings, keep bed in low position, maintain call bell within reach at all times, provide assistance with transfer out of bed and ambulation.  Medication Inspection Compliance: Pill count conducted under aseptic conditions, in front of the patient. Neither the pills nor the bottle was removed from the patient's sight at any time. Once count was completed pills were immediately returned to the patient in their original bottle.  Medication: Hydrocodone /APAP Pill/Patch Count: 63 of 120 pills/patches remain Pill/Patch Appearance: Markings consistent with prescribed medication Bottle Appearance: Standard pharmacy container. Clearly labeled. Filled Date: 83 / 01 / 2025 Last Medication intake:  Today

## 2024-02-09 ENCOUNTER — Ambulatory Visit: Payer: Self-pay

## 2024-02-09 DIAGNOSIS — M79604 Pain in right leg: Secondary | ICD-10-CM

## 2024-02-09 NOTE — Telephone Encounter (Signed)
 Please call Ellouise and let her know that I placed the referral for ortho. Someone should reach out.  -Carrol Aurora, DNP, AGNP-C 02/09/2024 2:05 PM

## 2024-02-09 NOTE — Telephone Encounter (Signed)
 FYI Only or Action Required?: Action required by provider: referral request. Pt's daughter Ellouise requesting a referral to Ortho as pt's feet/legs seems to be worsening and Rheumatology recommended she see Ortho. Advised they would like referral without appt but are willing to come in if PCP would like to see her. Please advise Dtr Ellouise.  Patient was last seen in primary care on 01/14/2024 by Vincente Shivers, NP.  Called Nurse Triage reporting Advice Only.  Symptoms began several years ago, worsening.  Interventions attempted: Rest, hydration, or home remedies.  Symptoms are: gradually worsening.  Triage Disposition: Call PCP Within 24 Hours  Patient/caregiver understands and will follow disposition?: Yes   Copied from CRM #[id number]. Topic: Clinical - Red Word Triage >> Feb 09, 2024 12:07 PM Alfonso ORN wrote: Red Word that prompted transfer to Nurse Triage: unable to walk,feet and knee pain getting worse was told by  Rheumatology provider to get referral for ortho . Appt needed Reason for Disposition  [1] Follow-up call from patient regarding patient's clinical status AND [2] information NON-URGENT  Answer Assessment - Initial Assessment Questions 1. REASON FOR CALL or QUESTION: What is your reason for calling today? or How can I best     Pt's daughter requesting ortho referral for pt 2. CALLER: Document the source of call. (e.g., laboratory staff, caregiver or patient).     Ellouise, pt's daughter, on [place]  Protocols used: PCP Call - No Triage-A-AH

## 2024-02-09 NOTE — Telephone Encounter (Signed)
 Daughter has been notified referral for ortho was placed.

## 2024-02-10 ENCOUNTER — Other Ambulatory Visit: Payer: Self-pay | Admitting: General Practice

## 2024-02-14 ENCOUNTER — Ambulatory Visit

## 2024-02-14 VITALS — BP 120/60 | HR 57 | Ht 66.0 in | Wt 199.0 lb

## 2024-02-14 DIAGNOSIS — M79671 Pain in right foot: Secondary | ICD-10-CM

## 2024-02-14 DIAGNOSIS — G8929 Other chronic pain: Secondary | ICD-10-CM | POA: Diagnosis not present

## 2024-02-14 DIAGNOSIS — M1711 Unilateral primary osteoarthritis, right knee: Secondary | ICD-10-CM

## 2024-02-14 DIAGNOSIS — M25561 Pain in right knee: Secondary | ICD-10-CM

## 2024-02-14 MED ADMIN — Triamcinolone Acetonide Inj Susp 40 MG/ML: 1 mg | INTRA_ARTICULAR | NDC 70121116801

## 2024-02-14 MED ADMIN — Lidocaine HCl Local Inj 1%: 4 mL | NDC 63323020103

## 2024-02-14 NOTE — Progress Notes (Signed)
 "  Office Visit Note   Patient: Kelsey Diaz           Date of Birth: October 03, 1945           MRN: [id number] Visit Date: 02/14/2024              Requested by: Vincente Shivers, NP [address] [place],  [place] [address] PCP: Vincente Shivers, NP   Assessment & Plan: Visit Diagnoses:  1. Arthritis of knee, right   2. Right foot pain     Plan: Natural history and expected course discussed. Questions answered. Recommend rest and ice. PT referral for knee strengthening and ROM. Discussed intraarticular steroid injection of the right knee with patient. After discussing the risks(which include skin discoloration, infection, risk of speeding up arthritis, and risk of hyperglycemia in diabetics) and benefits, they opted to proceed with steroid injection.  Will follow up in 3 months  Referred to [facility] for evaluation of right foot.  Procedure: The risks and benefits of a cortisone injection were discussed and the patient wishes to proceed.  The lateral compartment of the right knee was cleansed with alcohol swabs and ChloraPrep.  The skin was flash cooled with Ethyl Chloride, and using sterile technique, a 22 gauge needle was immediately introduced into the joint.  1cc of 40 mg/mL of Kenalog  and 4cc of 1% lidocaine  easily flowed into the joint.  The needle was withdrawn and pressure was applied for hemostasis.  A bandage was applied.  The patient tolerated the procedure well.   Orders:  Orders Placed This Encounter  Procedures   Large Joint Inj: R knee   DG Knee 3 Views Right   DG Foot Complete Right   Ambulatory referral to Podiatry   Ambulatory referral to Physical Therapy     Subjective: Chief Complaint: Right knee pain, right foot pain  HPI Patient is a 78 y.o. year old female who presents with a knee injury involving the  right knee. Onset of the symptoms was several years ago. Inciting event: twisting injury while working. Reports that she had surgery after to  repair the knee. Current symptoms include pain located medially and laterally. Pain is aggravated by any weight bearing.  Patient has had prior knee problems. Also complains of foot pain. Reports that she has been seen for a flat foot before and surgery recommended. Right foot continues to hurt with ambulation.  Objective: Vital Signs: BP 120/60 (Cuff Size: Large)   Pulse (!) 57   Ht 5' 6 (1.676 m)   Wt 199 lb (90.3 kg)   BMI 32.12 kg/m   Physical Exam Gen: Alert, No Acute Distress right knee: Skin intact, no erythema or induration noted. medial joint line and lateral joint line tenderness to palpation. Range of motion 0 to 130. No instability with varus or valgus stress. Right foot: skin intact, no erythema or induration noted. There is loss of arch. TTP of 1st tarsal metatarsal joint.  Imaging: Radiographs personally reviewed by me; reveal moderate osteoarthritis of the right knee. Radiographs of the right foot reveal diffuse degenerative disease, most pronounced at 1st TMT joint of right foot.    PMFS History: Patient Active Problem List   Diagnosis Date Noted   Lichen sclerosus of female genitalia 01/14/2024   Dermatitis associated with moisture 01/14/2024   Gastroesophageal reflux disease 01/14/2024   Restless leg syndrome 01/14/2024   Need for influenza vaccination 10/29/2023   Type 2 diabetes mellitus without complication, without  long-term current use of insulin (HCC) 10/14/2023   Hypothyroidism 10/14/2023   Spinal stenosis of lumbar region 08/17/2023   Annual visit for general adult medical examination with abnormal findings 07/14/2023   Prediabetes 07/14/2023   Dysuria 07/14/2023   Urine frequency 05/04/2023   Acute cough 05/04/2023   Bilateral leg pain 03/30/2023   Acute pain of left shoulder 03/15/2023   Insomnia 03/15/2023   Recurrent UTI 04/06/2021   Ventricular tachycardia (HCC) 12/11/2020   Fibromyalgia 05/16/2019   Rheumatoid arthritis (HCC) 10/03/2018    HTN (hypertension) 09/13/2017   OSA (obstructive sleep apnea) 02/09/2012   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   Past Medical History:  Diagnosis Date   Abnormal nuclear stress test 06/03/2017   Allergic rhinitis    Anxiety    Asthma    Bacteremia 04/05/2021   Bilateral hip pain 11/28/2018   Chest pain 06/03/2017   Colitis    COPD (chronic obstructive pulmonary disease) (HCC)    Dizzy 06/13/2012   E-coli UTI 04/04/2021   Educated about COVID-19 virus infection 07/02/2019   Fibromyalgia 05/16/2019   GERD (gastroesophageal reflux disease)    With esophageal strictures   Hand pain 11/28/2018   Hypertension    Hyperthyroidism    Hypomagnesemia 05/03/2022   Normocytic anemia 05/03/2022   OSA (obstructive sleep apnea) 02/09/2012   No mask.  Did not tolerate   Osteoarthritis    PNA (pneumonia) 09/13/2017   Rheumatoid arthritis (HCC) 10/03/2018   Sepsis due to Escherichia coli (E. coli) (HCC) 04/05/2021   SOB (shortness of breath) 07/02/2019   Thrombocytopenia 05/03/2022    Family History  Problem Relation Age of Onset   Hypertension Mother    Diabetes Mother    Allergies Mother    Heart attack Father 48   Cancer Father        kidney   Emphysema Father    Heart disease Father        CHF age 46   Heart attack Brother 40       Died of MI age 57    Past Surgical History:  Procedure Laterality Date   BREAST BIOPSY     x2   CATARACT EXTRACTION W/PHACO Left 09/27/2018   Procedure: CATARACT EXTRACTION PHACO AND INTRAOCULAR LENS PLACEMENT (IOC) LEFT;  Surgeon: Jaye Fallow, MD;  Location: [facility];  Service: Ophthalmology;  Laterality: Left;   CATARACT EXTRACTION W/PHACO Right 11/29/2018   Procedure: CATARACT EXTRACTION PHACO AND INTRAOCULAR LENS PLACEMENT (IOC) RIGHT;  Surgeon: Jaye Fallow, MD;  Location: [facility];  Service: Ophthalmology;  Laterality: Right;  1:22 20.1% 16.64   CHOLECYSTECTOMY     2005   KNEE SURGERY Right 2013   two torn  ligaments repaired.    LEFT HEART CATH AND CORONARY ANGIOGRAPHY N/A 06/08/2017   Procedure: LEFT HEART CATH AND CORONARY ANGIOGRAPHY;  Surgeon: Burnard Debby LABOR, MD;  Location: [facility];  Service: Cardiovascular;  Laterality: N/A;   LEFT HEART CATH AND CORONARY ANGIOGRAPHY N/A 12/12/2020   Procedure: LEFT HEART CATH AND CORONARY ANGIOGRAPHY;  Surgeon: Court Dorn PARAS, MD;  Location: [facility];  Service: Cardiovascular;  Laterality: N/A;   NASAL SINUS SURGERY     V TACH ABLATION N/A 12/16/2020   Procedure: V TACH ABLATION;  Surgeon: Cindie Ole DASEN, MD;  Location: [facility];  Service: Cardiovascular;  Laterality: N/A;   VAGINAL HYSTERECTOMY     Social History   Occupational History   Occupation: Retired  Employer: [employer]  Tobacco Use   Smoking status: Former    Current packs/day: 0.00    Average packs/day: 2.0 packs/day for 40.0 years (80.0 ttl pk-yrs)    Types: Cigarettes    Start date: 02/24/1959    Quit date: 02/24/1999    Years since quitting: 24.9   Smokeless tobacco: Never  Vaping Use   Vaping status: Never Used  Substance and Sexual Activity   Alcohol use: Not Currently    Comment:     Drug use: No   Sexual activity: Not on file   Current Outpatient Medications  Medication Instructions   albuterol  (ACCUNEB ) 0.63 MG/3ML nebulizer solution 1 ampule, Every 6 hours PRN   albuterol  (VENTOLIN  HFA) 108 (90 Base) MCG/ACT inhaler INHALE 2 PUFFS BY MOUTH EVERY 4 HRS AS NEEDED   amiodarone  (PACERONE ) 100 mg, Oral, Daily   aspirin  81 mg, Daily   Blood Glucose Monitoring Suppl DEVI 1 each, Does not apply, 3 times daily, May substitute to any manufacturer covered by patient's insurance.   Calcium  Carbonate-Vitamin D  600-200 MG-UNIT TABS 1 tablet, Daily   Carboxymethylcellulose Sodium (EYE DROPS OP) 2 drops, As needed   Clobetasol  Prop Emollient Base 0.05 % emollient cream 1 application , 2 times daily PRN   clotrimazole-betamethasone  (LOTRISONE) cream  APPLY 1 APPLICATION TOPICALLY TWICE A DAY FOR 5 DAYS   CRANBERRY PO 1 tablet, Daily   DULoxetine  (CYMBALTA ) 60 mg, Oral, 2 times daily   Enbrel 50 mg, Every Wed   fluticasone  (FLONASE ) 50 MCG/ACT nasal spray 2 sprays, Daily at bedtime   gabapentin  (NEURONTIN ) 100 mg, Oral, Daily at bedtime   [START ON 02/23/2024] HYDROcodone -acetaminophen  (NORCO) 7.5-325 MG tablet 1 tablet, Oral, Every 6 hours PRN   [START ON 03/24/2024] HYDROcodone -acetaminophen  (NORCO) 7.5-325 MG tablet 1 tablet, Oral, Every 6 hours PRN   levothyroxine  (SYNTHROID ) 50 mcg, Oral, Daily   magnesium  oxide (MAG-OX) 400 mg, Oral, Daily   meloxicam  (MOBIC ) 15 mg, Oral, Daily   metoprolol  succinate (TOPROL -XL) 12.5 mg, Oral, 2 times daily   Multiple Vitamin (MULTI-VITAMIN PO) 1 tablet, Daily   nystatin  cream (MYCOSTATIN ) 1 Application, Topical, 2 times daily   Omega-3 Fatty Acids (FISH OIL PO) 2 capsules, Daily   pantoprazole  (PROTONIX ) 20 mg, Oral, Daily   rosuvastatin  (CRESTOR ) 20 MG tablet TAKE ONE TABLET BY MOUTH WEEKLY ON MONDAY   tacrolimus  (PROTOPIC ) 0.1 % ointment Topical, 2 times daily, For first 2 weeks mix with Desitin prior to applying to the affected areas.   Tiotropium Bromide-Olodaterol (STIOLTO RESPIMAT ) 2.5-2.5 MCG/ACT AERS 1 puff, Inhalation, Daily   Allergies as of 02/14/2024 - Review Complete 02/08/2024  Allergen Reaction Noted   Morphine Other (See Comments) 10/17/2009   Advair diskus [fluticasone -salmeterol] Other (See Comments) 09/07/2011   Ambien [zolpidem tartrate] Other (See Comments) 09/07/2011   Amlodipine besylate Other (See Comments) 09/07/2011   Azithromycin  Other (See Comments) 01/15/2021   Bextra [valdecoxib] Other (See Comments) 09/07/2011   Bupropion Other (See Comments) 01/15/2021   Captopril Other (See Comments) 09/07/2011   Irbesartan  Other (See Comments) 01/15/2021   Latex Itching 01/19/2017   Lisinopril Cough 09/07/2011   Pacerone  [amiodarone ] Other (See Comments) 04/04/2021    Pulmicort [budesonide] Other (See Comments) 09/07/2011   Savella [milnacipran hcl] Other (See Comments) 09/07/2011   Spiriva [tiotropium bromide] Other (See Comments) 09/07/2011   Suvorexant Other (See Comments) 01/15/2021   Tiotropium bromide Other (See Comments) 01/15/2021    "

## 2024-02-14 NOTE — Progress Notes (Signed)
" ° °  Procedure Note  Patient: Kelsey Diaz             Date of Birth: 1945/03/06           MRN: [id number]             Visit Date: 02/14/2024  Procedures: Visit Diagnoses:  1. Arthritis of knee, right   2. Right foot pain     Large Joint Inj: R knee on 02/14/2024 4:04 PM Indications: pain Details: 22 G 1.5 in needle, anterolateral approach  Arthrogram: No  Medications: 4 mL lidocaine  1 %; 1 mg triamcinolone  acetonide 40 MG/ML  Injection given, no complications noted Procedure, treatment alternatives, risks and benefits explained, specific risks discussed. Consent was given by the patient. Patient was prepped and draped in the usual sterile fashion.        "

## 2024-03-07 ENCOUNTER — Ambulatory Visit (INDEPENDENT_AMBULATORY_CARE_PROVIDER_SITE_OTHER)

## 2024-03-07 ENCOUNTER — Encounter: Payer: Self-pay | Admitting: Podiatry

## 2024-03-07 ENCOUNTER — Ambulatory Visit: Admitting: Podiatry

## 2024-03-07 ENCOUNTER — Ambulatory Visit

## 2024-03-07 DIAGNOSIS — M778 Other enthesopathies, not elsewhere classified: Secondary | ICD-10-CM | POA: Diagnosis not present

## 2024-03-07 DIAGNOSIS — M19071 Primary osteoarthritis, right ankle and foot: Secondary | ICD-10-CM | POA: Diagnosis not present

## 2024-03-07 MED ORDER — BETAMETHASONE SOD PHOS & ACET 6 (3-3) MG/ML IJ SUSP
3.0000 mg | Freq: Once | INTRAMUSCULAR | Status: AC
  Administered 2024-03-07: 3 mg via INTRA_ARTICULAR

## 2024-03-07 NOTE — Progress Notes (Signed)
 "  Chief Complaint  Patient presents with   Foot Pain    Right foot pain, has knot on foot that she believes is getting bigger, has a tingling sensation to the toes.    HPI: 79 y.o. femalepresenting for evaluation of right foot pain ongoing for several years progressively getting worse.  She has been diagnosed in the past with rheumatoid arthritis.  She is not interested in surgery.  Past Medical History:  Diagnosis Date   Abnormal nuclear stress test 06/03/2017   Allergic rhinitis    Anxiety    Asthma    Bacteremia 04/05/2021   Bilateral hip pain 11/28/2018   Chest pain 06/03/2017   Colitis    COPD (chronic obstructive pulmonary disease) (HCC)    Dizzy 06/13/2012   E-coli UTI 04/04/2021   Educated about COVID-19 virus infection 07/02/2019   Fibromyalgia 05/16/2019   GERD (gastroesophageal reflux disease)    With esophageal strictures   Hand pain 11/28/2018   Hypertension    Hyperthyroidism    Hypomagnesemia 05/03/2022   Normocytic anemia 05/03/2022   OSA (obstructive sleep apnea) 02/09/2012   No mask.  Did not tolerate   Osteoarthritis    PNA (pneumonia) 09/13/2017   Rheumatoid arthritis (HCC) 10/03/2018   Sepsis due to Escherichia coli (E. coli) (HCC) 04/05/2021   SOB (shortness of breath) 07/02/2019   Thrombocytopenia 05/03/2022    Past Surgical History:  Procedure Laterality Date   BREAST BIOPSY     x2   CATARACT EXTRACTION W/PHACO Left 09/27/2018   Procedure: CATARACT EXTRACTION PHACO AND INTRAOCULAR LENS PLACEMENT (IOC) LEFT;  Surgeon: Jaye Fallow, MD;  Location: [facility];  Service: Ophthalmology;  Laterality: Left;   CATARACT EXTRACTION W/PHACO Right 11/29/2018   Procedure: CATARACT EXTRACTION PHACO AND INTRAOCULAR LENS PLACEMENT (IOC) RIGHT;  Surgeon: Jaye Fallow, MD;  Location: [facility];  Service: Ophthalmology;  Laterality: Right;  1:22 20.1% 16.64   CHOLECYSTECTOMY     2005   KNEE SURGERY Right 2013   two torn ligaments  repaired.    LEFT HEART CATH AND CORONARY ANGIOGRAPHY N/A 06/08/2017   Procedure: LEFT HEART CATH AND CORONARY ANGIOGRAPHY;  Surgeon: Burnard Debby LABOR, MD;  Location: [facility];  Service: Cardiovascular;  Laterality: N/A;   LEFT HEART CATH AND CORONARY ANGIOGRAPHY N/A 12/12/2020   Procedure: LEFT HEART CATH AND CORONARY ANGIOGRAPHY;  Surgeon: Court Dorn PARAS, MD;  Location: [facility];  Service: Cardiovascular;  Laterality: N/A;   NASAL SINUS SURGERY     V TACH ABLATION N/A 12/16/2020   Procedure: V TACH ABLATION;  Surgeon: Cindie Ole DASEN, MD;  Location: [facility];  Service: Cardiovascular;  Laterality: N/A;   VAGINAL HYSTERECTOMY      Allergies[1]   Physical Exam: General: The patient is alert and oriented x3 in no acute distress.  Dermatology: Skin is warm, dry and supple bilateral lower extremities.   Vascular: Palpable pedal pulses bilaterally. Capillary refill within normal limits.  No appreciable edema.  No erythema.  Neurological: Grossly intact via light touch  Musculoskeletal Exam: Flatfoot deformity noted with collapse of the medial longitudinal arch of the foot and midfoot right  Radiographic Exam RT foot 03/07/2024:  Advanced severe degenerative changes noted to the midtarsal and TMT right foot.  No acute fractures identified.  Collapse of the medial longitudinal arch of the foot noted on lateral view  Assessment/Plan of Care: 1.  Advanced severe arthritis RT foot TMT  - Patient evaluated.  X-rays  reviewed -Injection of 0.5 cc Celestone  Soluspan injected around the 1st and 2nd TMT of the right foot -Patient already is on a different anti-inflammatory medication for rheumatoid arthritis -She also takes gabapentin  at night -Continue good supportive tennis shoes and sneakers.  Refrain from going barefoot -Return to clinic PRN     Thresa EMERSON Sar, DPM [facility]  Dr. Thresa EMERSON Sar, DPM    [address] [place], [place] [address]                Office [phone]  Fax [phone]        [1]  Allergies Allergen Reactions   Morphine Other (See Comments)    Stopped heart   Advair Diskus [Fluticasone -Salmeterol] Other (See Comments)    Sensitivity to smells   Ambien [Zolpidem Tartrate] Other (See Comments)    irritable   Amlodipine Besylate Other (See Comments)    Feels bad   Azithromycin  Other (See Comments)     Unknown   Bextra [Valdecoxib] Other (See Comments)    Stomach issues   Bupropion Other (See Comments)    involuntary muscle movements   Captopril Other (See Comments)    Ache   Irbesartan  Other (See Comments)    dizzy   Latex Itching    Bandaids only   Lisinopril Cough   Pacerone  [Amiodarone ] Other (See Comments)    falls   Pulmicort [Budesonide] Other (See Comments)    Worse breathing, sensitivity to smells   Savella [Milnacipran Hcl] Other (See Comments)    Mental fog   Spiriva [Tiotropium Bromide] Other (See Comments)    Dry mouth   Suvorexant Other (See Comments)     not effective   Tiotropium Bromide Other (See Comments)     dry mouth   "

## 2024-04-06 ENCOUNTER — Ambulatory Visit: Admitting: Anesthesiology

## 2024-04-17 ENCOUNTER — Ambulatory Visit: Admitting: General Practice

## 2024-05-01 ENCOUNTER — Ambulatory Visit: Admitting: Dermatology

## 2024-05-16 ENCOUNTER — Ambulatory Visit: Admitting: Pulmonary Disease
